# Patient Record
Sex: Female | Born: 1935 | Race: Black or African American | Hispanic: No | Smoking: Never smoker
Health system: Southern US, Community
[De-identification: ages and names within clinical notes are randomized; demographics above are authoritative.]

## PROBLEM LIST (undated history)

## (undated) DIAGNOSIS — I480 Paroxysmal atrial fibrillation: Secondary | ICD-10-CM

## (undated) DIAGNOSIS — K648 Other hemorrhoids: Secondary | ICD-10-CM

## (undated) DIAGNOSIS — I2510 Atherosclerotic heart disease of native coronary artery without angina pectoris: Secondary | ICD-10-CM

## (undated) DIAGNOSIS — K922 Gastrointestinal hemorrhage, unspecified: Secondary | ICD-10-CM

## (undated) DIAGNOSIS — I10 Essential (primary) hypertension: Secondary | ICD-10-CM

## (undated) DIAGNOSIS — I639 Cerebral infarction, unspecified: Secondary | ICD-10-CM

## (undated) DIAGNOSIS — E119 Type 2 diabetes mellitus without complications: Secondary | ICD-10-CM

## (undated) HISTORY — DX: Cerebral infarction, unspecified: I63.9

## (undated) HISTORY — DX: Other hemorrhoids: K64.8

## (undated) HISTORY — PX: CATARACT EXTRACTION: SUR2

## (undated) HISTORY — DX: Paroxysmal atrial fibrillation: I48.0

---

## 1997-05-21 ENCOUNTER — Ambulatory Visit (HOSPITAL_COMMUNITY): Admission: RE | Admit: 1997-05-21 | Discharge: 1997-05-21 | Payer: Self-pay | Admitting: Cardiology

## 1997-05-22 ENCOUNTER — Ambulatory Visit (HOSPITAL_COMMUNITY): Admission: RE | Admit: 1997-05-22 | Discharge: 1997-05-22 | Payer: Self-pay | Admitting: Cardiology

## 2000-02-14 ENCOUNTER — Ambulatory Visit (HOSPITAL_COMMUNITY): Admission: RE | Admit: 2000-02-14 | Discharge: 2000-02-14 | Payer: Self-pay | Admitting: *Deleted

## 2000-05-13 ENCOUNTER — Inpatient Hospital Stay (HOSPITAL_COMMUNITY): Admission: EM | Admit: 2000-05-13 | Discharge: 2000-05-19 | Payer: Self-pay

## 2000-05-15 ENCOUNTER — Encounter: Payer: Self-pay | Admitting: Cardiovascular Disease

## 2003-02-21 ENCOUNTER — Ambulatory Visit (HOSPITAL_COMMUNITY): Admission: RE | Admit: 2003-02-21 | Discharge: 2003-02-21 | Payer: Self-pay | Admitting: Cardiovascular Disease

## 2003-09-24 ENCOUNTER — Other Ambulatory Visit: Admission: RE | Admit: 2003-09-24 | Discharge: 2003-09-24 | Payer: Self-pay | Admitting: Obstetrics and Gynecology

## 2008-01-18 ENCOUNTER — Ambulatory Visit (HOSPITAL_COMMUNITY): Admission: RE | Admit: 2008-01-18 | Discharge: 2008-01-18 | Payer: Self-pay | Admitting: Cardiovascular Disease

## 2008-01-23 ENCOUNTER — Ambulatory Visit (HOSPITAL_COMMUNITY): Admission: RE | Admit: 2008-01-23 | Discharge: 2008-01-23 | Payer: Self-pay | Admitting: Obstetrics and Gynecology

## 2008-01-23 ENCOUNTER — Encounter (INDEPENDENT_AMBULATORY_CARE_PROVIDER_SITE_OTHER): Payer: Self-pay | Admitting: Obstetrics and Gynecology

## 2009-03-23 ENCOUNTER — Encounter: Admission: RE | Admit: 2009-03-23 | Discharge: 2009-03-23 | Payer: Self-pay | Admitting: Obstetrics and Gynecology

## 2009-03-23 IMAGING — US US SOFT TISSUE HEAD/NECK
1 series · 14 of 25 positions shown · non-contrast
Comparison: None

CLINICAL DATA: Enlarged thyroid

THYROID ULTRASOUND
TECHNIQUE: Ultrasound examination of the thyroid gland and
adjacent soft tissues was performed.

[Series 1: us soft tissue head/neck · 0.06mm/px · 14 of 45 slices shown]
[im 1/45]
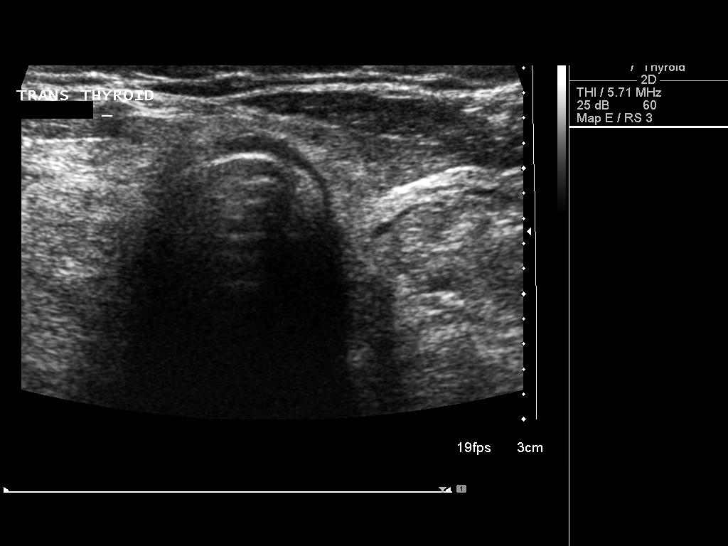
[im 4/45]
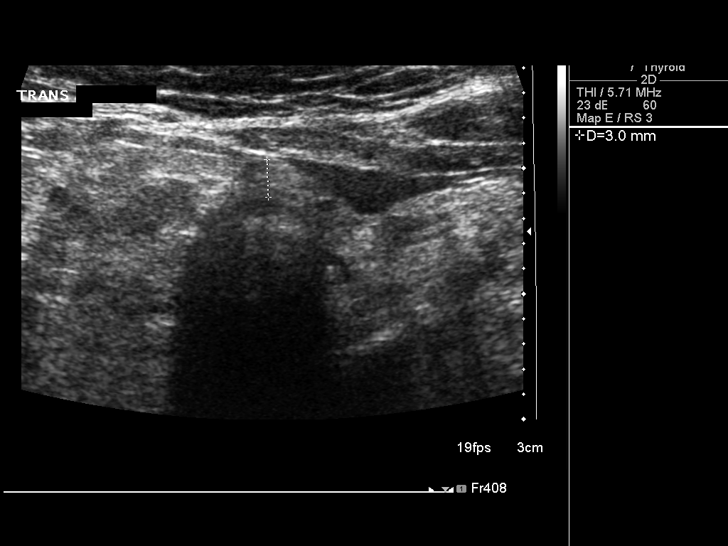
[im 8/45]
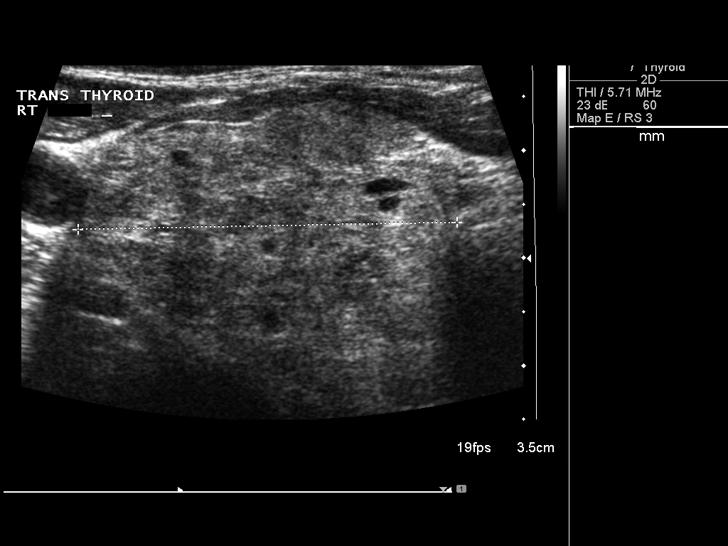
[im 12/45]
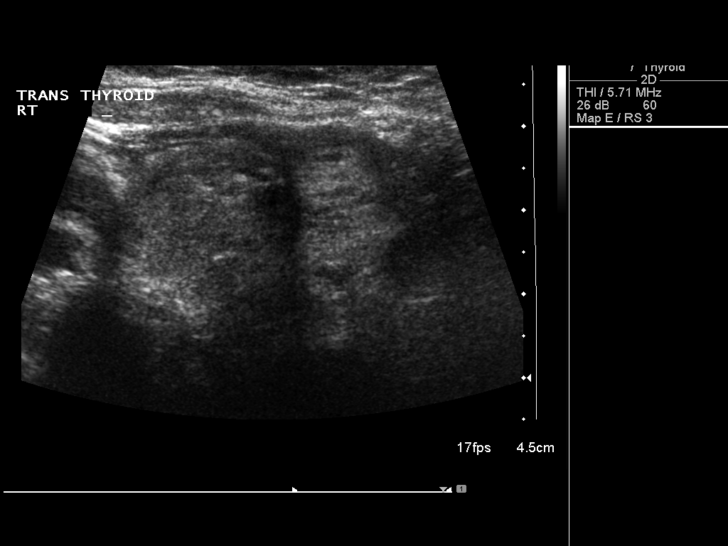
[im 15/45]
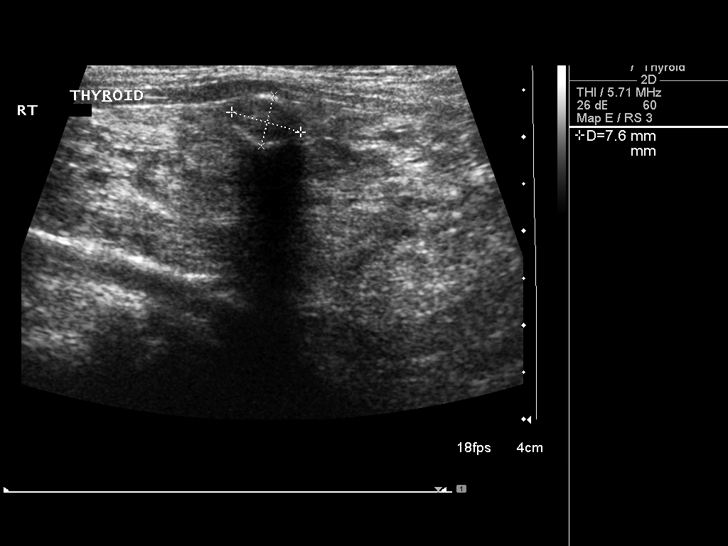
[im 17/45]
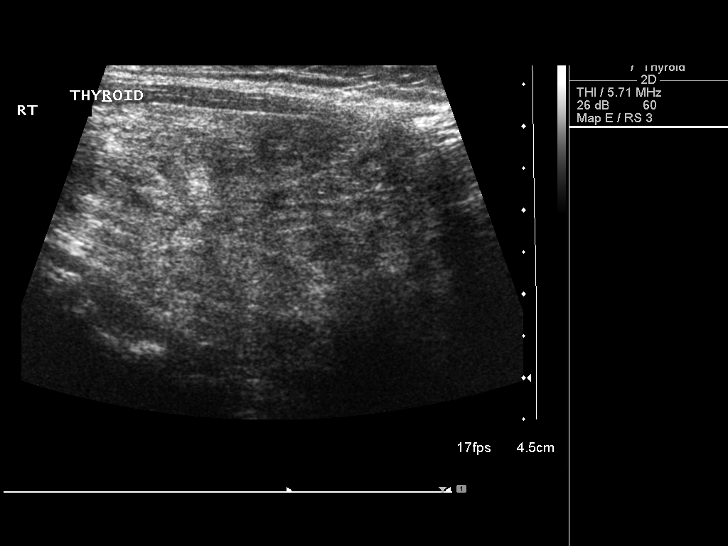
[im 21/45]
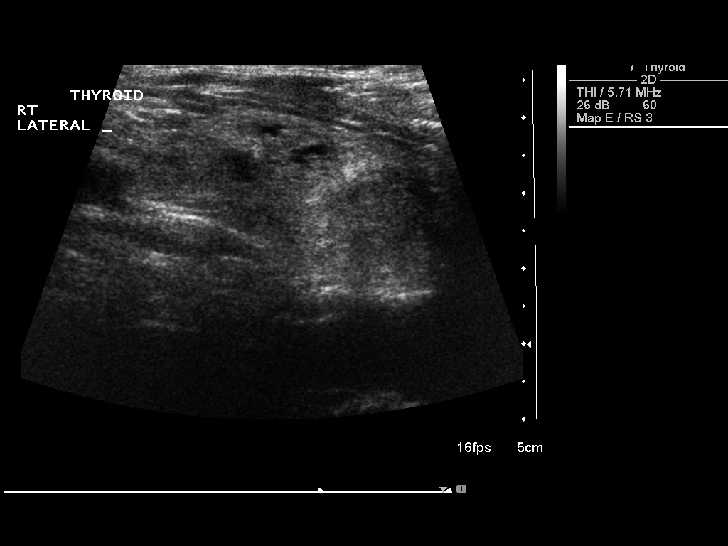
[im 24/45]
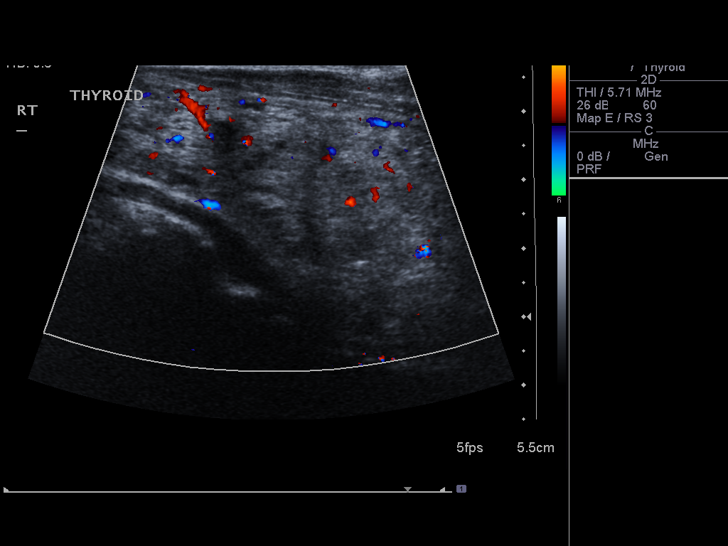
[im 28/45]
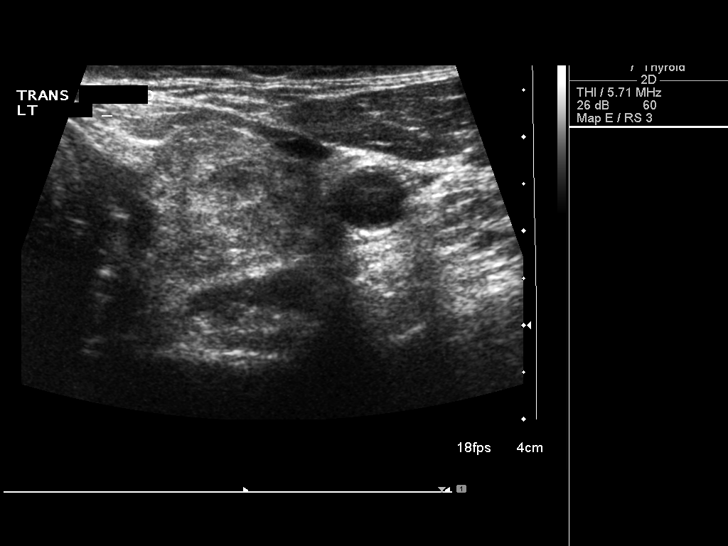
[im 30/45]
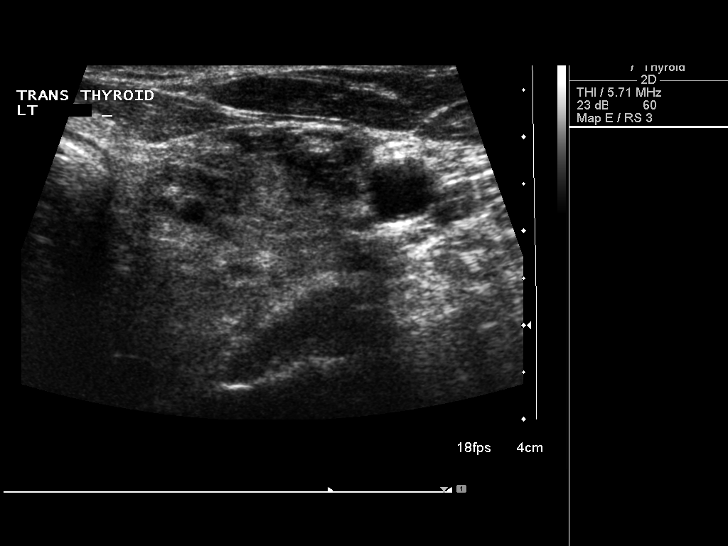
[im 34/45]
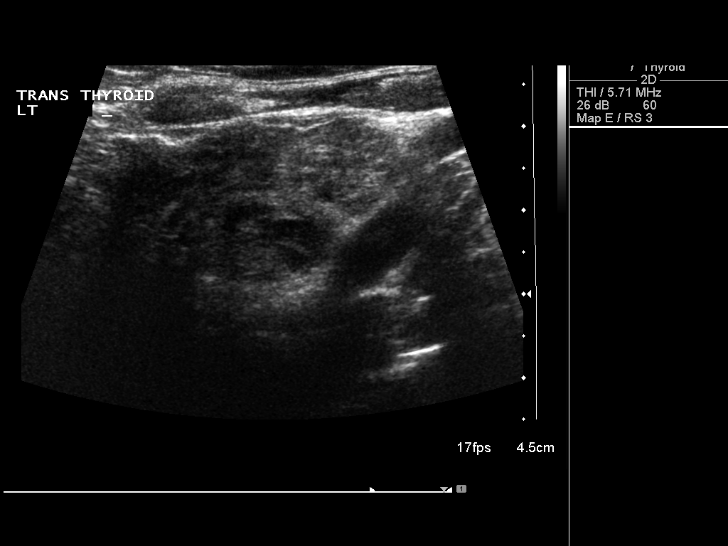
[im 37/45]
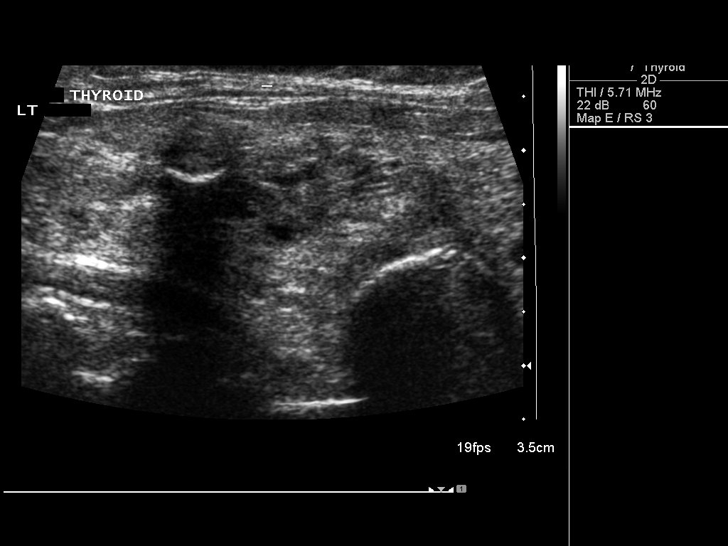
[im 41/45]
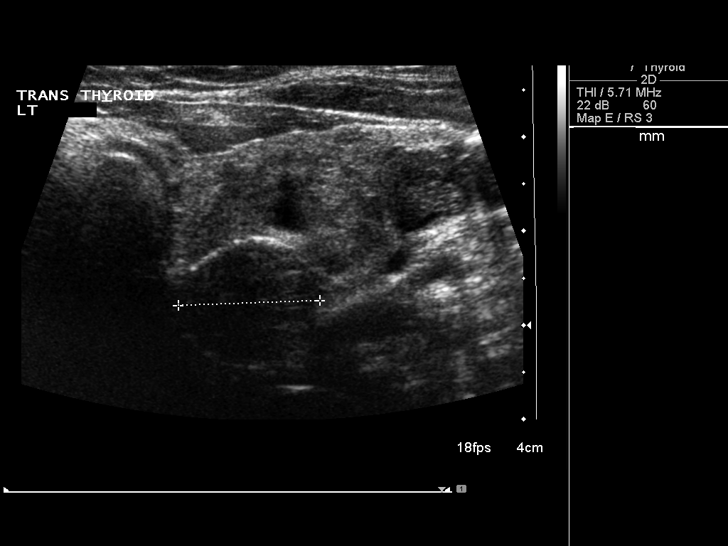
[im 45/45]
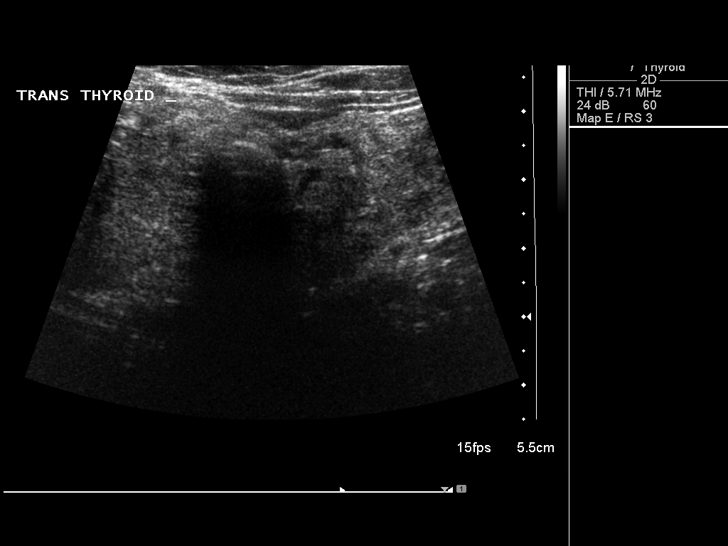

[14 of 25 positions shown; findings below may reference images not displayed]

FINDINGS: The thyroid gland is diffusely enlarged, inhomogeneous,
and nodular.  The right lobe measures 7.4 cm sagittally, with a
depth of 3.0 cm and width of 3.5 cm.  The left lobe measures 7.8 x
2.5 x 3.0 cm, with the isthmus measuring 3 mm.  There is a solid
nodule in the lower pole of the left lobe of 1.7 x 1.4 x 1.5 cm
with calcified periphery.  A small nodule in the left mid upper
lobe measures 8 x 6 x 7 mm.  A small nodule in the right mid upper
lobe measures 8 x 6 x 7 mm.  Both of these small nodules have a
calcified periphery.
IMPRESSION: Diffusely enlarged, inhomogeneous, and nodular thyroid gland with
the dominant nodule in the lower pole on the left of 1.7 x 1.4 x
1.5 cm.

## 2009-05-25 ENCOUNTER — Ambulatory Visit (HOSPITAL_COMMUNITY): Admission: RE | Admit: 2009-05-25 | Discharge: 2009-05-25 | Payer: Self-pay | Admitting: Endocrinology

## 2009-06-12 ENCOUNTER — Ambulatory Visit (HOSPITAL_COMMUNITY): Admission: RE | Admit: 2009-06-12 | Discharge: 2009-06-12 | Payer: Self-pay | Admitting: Endocrinology

## 2009-06-12 IMAGING — US US BIOPSY
1 series · 5 of 5 positions shown · non-contrast
Comparison: Ultrasound performed [DATE] revealed a dominant
left lower pole nodule measuring 1.7 x 1.5 cm.

CLINICAL DATA: Palpable left thyroid nodule. INR today is 1.30.

ULTRASOUND-GUIDED NEEDLE ASPIRATE BIOPSY, LEFT LOBE OF THYROID
The above procedure was discussed with the patient and written
informed consent was obtained.

[Series 1: us biopsy · 0.09mm/px · 5 of 5 slices shown]
[im 1/5]
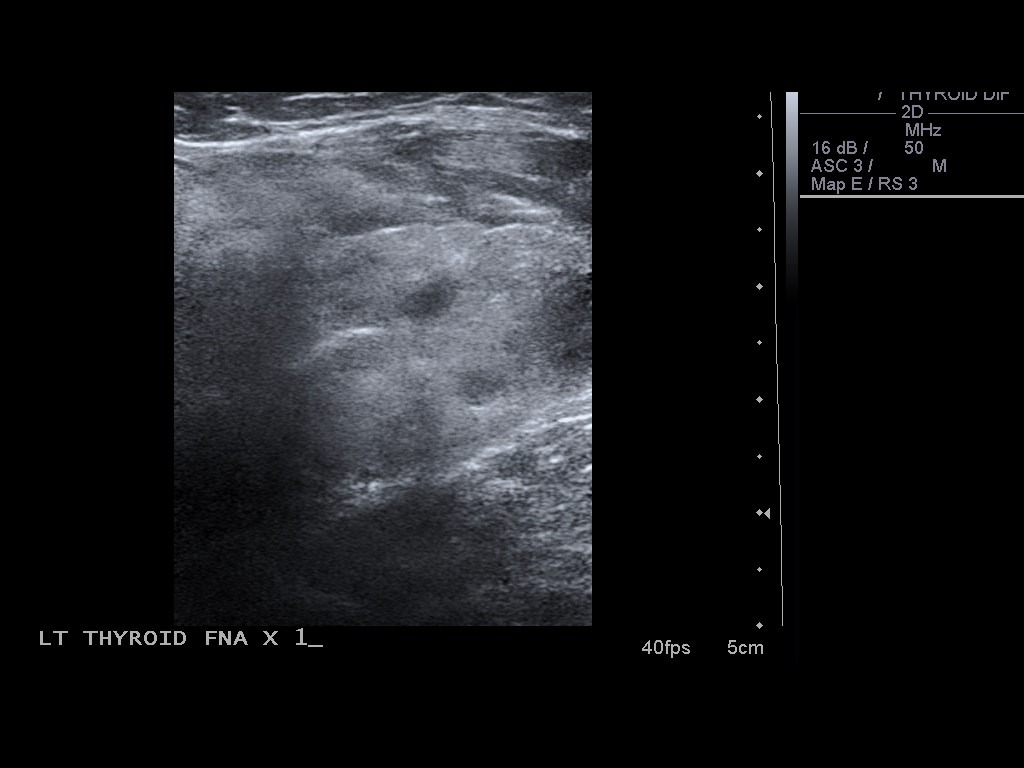
[im 2/5]
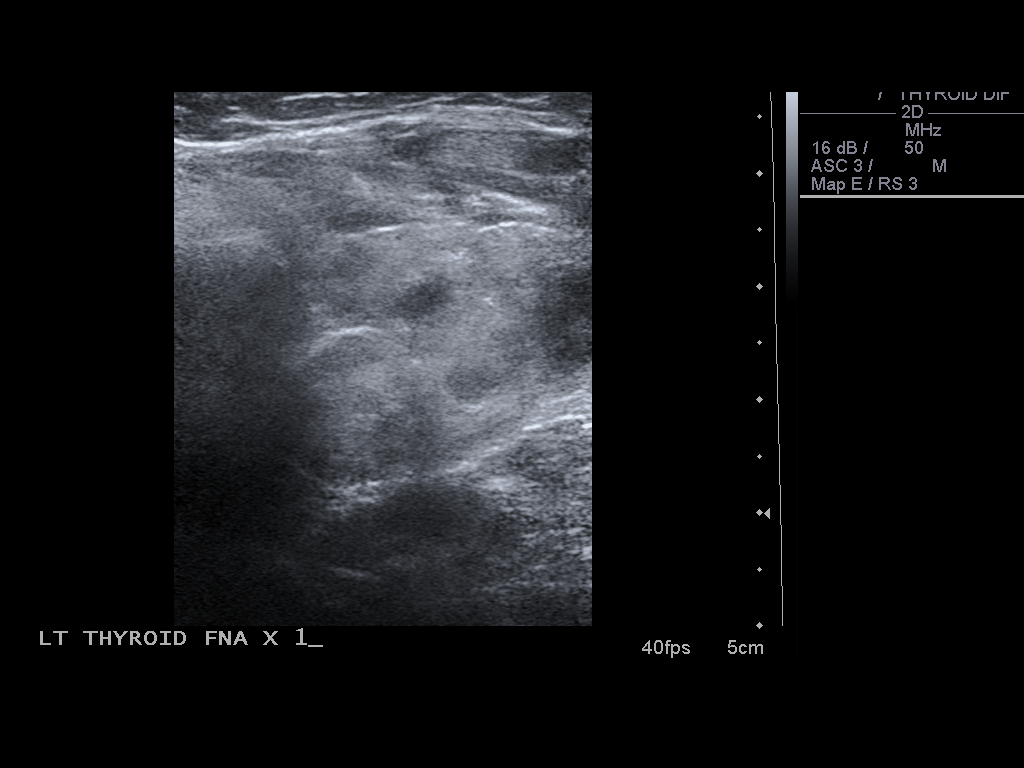
[im 3/5]
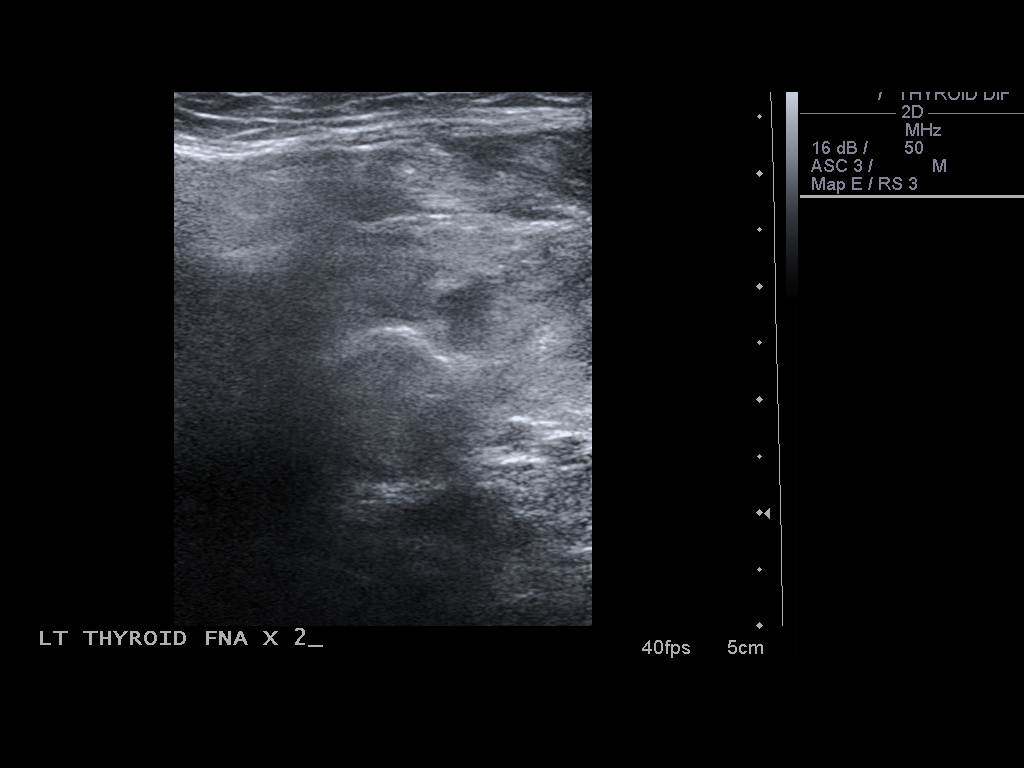
[im 4/5]
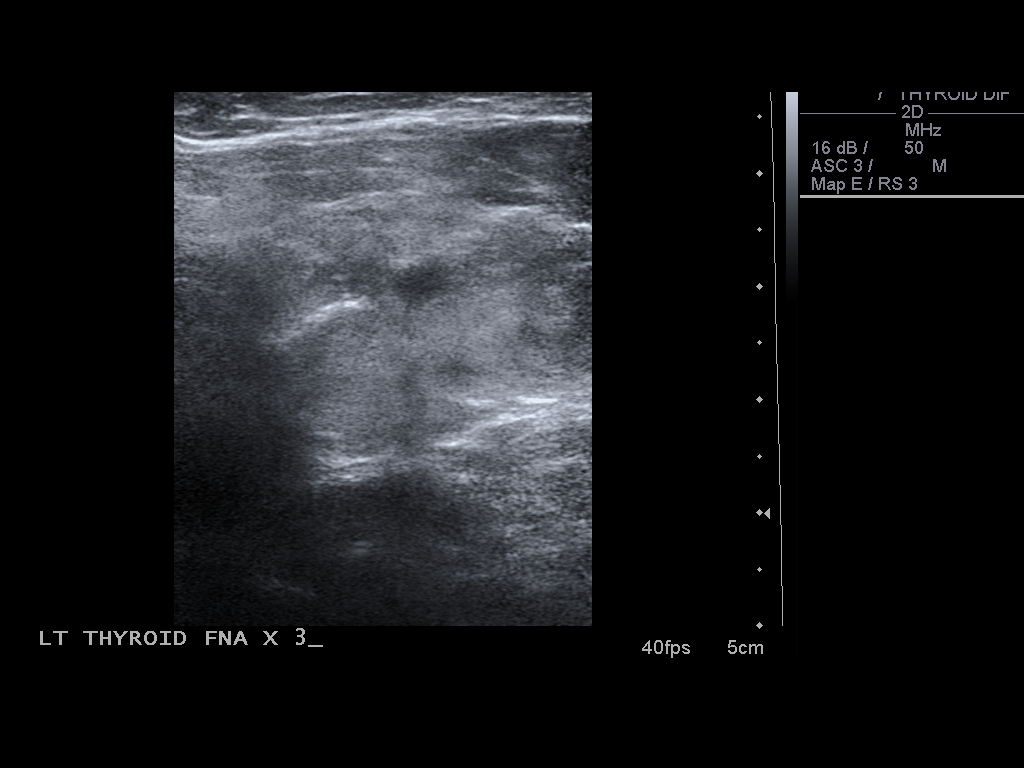
[im 5/5]
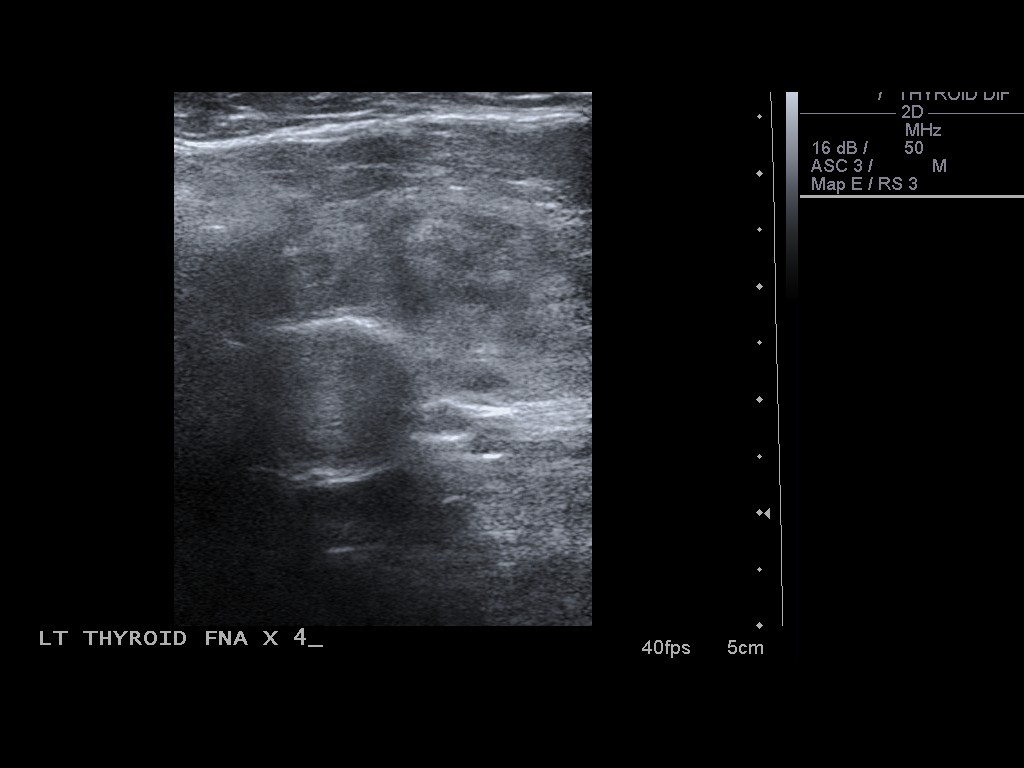

[5 of 5 positions shown; findings below may reference images not displayed]

Nuclear medicine
scan performed [DATE] which revealed a cold nodule which
corresponds to the left lower pole nodule seen on prior ultrasound
imaging.  Request has been made for fine needle aspirate of this
nodule.
FINDINGS: Ultrasound was performed to localize and mark an adequate
site for the biopsy.  The patient was then prepped and draped in a
normal sterile fashion.  Local anesthesia was provided with 1%
lidocaine.  Using direct ultrasound guidance,
4 passes were made using 25 gauge needles into the nodule within
the left lobe of the thyroid.  Ultrasound was used to confirm
needle placements on all occasions.  Specimens were sent to
Pathology for analysis.  Post procedural imaging demonstrated no
hematoma or immediate complication.  The patient tolerated the
procedure well.
IMPRESSION: Successful fine needle aspirate of left lower pole thyroid nodule
as described above.  The patient has been advised to resume her
Coumadin therapy tomorrow.

Read by: JUMPER.-JUMPER

## 2010-05-04 LAB — PROTIME-INR: Prothrombin Time: 16.1 seconds — ABNORMAL HIGH (ref 11.6–15.2)

## 2010-06-29 NOTE — Op Note (Signed)
NAME:  Patricia English, Patricia English NO.:  [id number]   MEDICAL RECORD NO.:  [id number]          PATIENT TYPE:  AMB   LOCATION:  SDC                           FACILITY:  [facility]   PHYSICIAN:  Osborn Coho, M.D.   DATE OF BIRTH:  07-23-1927   DATE OF PROCEDURE:  01/23/2008  DATE OF DISCHARGE:                               OPERATIVE REPORT   PREOPERATIVE DIAGNOSES:  1. Postmenopausal bleeding.  2. Endometrial masses.   POSTOPERATIVE DIAGNOSES:  1. Postmenopausal bleeding.  2. Endometrial polyps.   PROCEDURE:  1. Hysteroscopy.  2. D and C.  3. Polypectomy.   ATTENDING:  Osborn Coho, MD   ANESTHESIA:  General.   SPECIMENS TO PATHOLOGY:  1. Polyps.  2. Endometrial curettings.   FLUIDS:  900 mL.   URINE OUTPUT:  Quantity sufficient via straight cath prior to procedure.   ESTIMATED BLOOD LOSS:  Minimal.   COMPLICATIONS:  None.   PROCEDURE:  The patient was taken to the operating room after the risks,  benefits, and alternatives were discussed with the patient, the patient  verbalized understanding and consent signed and witnessed.  The patient  was placed under general anesthesia and prepped and draped in a normal  sterile fashion in the dorsal lithotomy position.  A bivalve speculum  placed in the patient's vagina, and a paracervical block administered  using a total of 10 mL of 1% lidocaine.  The anterior lip of the cervix  was grasped with a single-tooth tenaculum, and cervix was essentially  dilated and sounded to 10 cm.  The hysteroscope was introduced and  multiple polypoid lesions were noted.  There was one on each lateral  sidewall, one at the fundus, and one long one that extended from the  anterior uterine wall into the cervical canal.  Polypectomy was  performed until most of the polypoid tissue was removed, and curettage  was performed until a gritty texture was noted.  The hysteroscope was  introduced once again, and no obvious  intracavitary lesions  were noted at that time.  All instruments were  removed, and there was good hemostasis at the tenaculum sites.  Count  was correct.  The patient tolerated the procedure well and is currently  awaiting transfer to the recovery room in good condition.      Osborn Coho, M.D.  Electronically Signed     AR/MEDQ  D:  01/23/2008  T:  01/24/2008  Job:  [id number]

## 2010-07-02 NOTE — Cardiovascular Report (Signed)
[name], Patricia English                         ACCOUNT NO.:  [id number]   MEDICAL RECORD NO.:  [id number]                   PATIENT TYPE:  OIB   LOCATION:  2854                                 FACILITY:  [facility]   PHYSICIAN:  Ricki Rodriguez, M.D.               DATE OF BIRTH:  11-Oct-1927   DATE OF PROCEDURE:  02/21/2003  DATE OF DISCHARGE:                              CARDIAC CATHETERIZATION   PROCEDURE:  1. Left heart catheterization.  2. Selective coronary angiography.  3. Left ventricular function study.   INDICATIONS:  This 78 year old black female had known coronary artery  disease with typical chest pain and EKG changes of ischemia on stress test  along with risk factors of hypertension.   APPROACH:  Right femoral artery using 6-French diagnostic catheters.   COMPLICATIONS:  None.   AngioSeal applied at end of procedure successfully.   LEFT VENTRICULOGRAM:  The left ventriculogram showed good left ventricular  systolic function with ejection fraction of 70%.   HEMODYNAMIC DATA:  The left ventricle pressure was 137/20 and aortic  pressure was 137/69 mmHg.   CORONARY ANATOMY:  The left main coronary artery was unremarkable.   Left anterior descending coronary artery:  The left anterior descending  coronary artery had a mild proximal calcification with luminal  irregularities.  Rest of the vessel was unremarkable.  The diagonal branch  was also unremarkable.   Left circumflex coronary artery:  The left circumflex coronary artery was a  large vessel with normal obtuse marginal branch 1, 2, and 3.  It was a  codominant vessel.   Right coronary artery:  The right coronary artery was also codominant and  unremarkable.   IMPRESSION:  1. Minimal left anterior descending coronary artery disease.  2. Normal left ventricular systolic function.   RECOMMENDATIONS:  This patient will be treated medically.                                              Ricki Rodriguez, M.D.   ASK/MEDQ  D:  02/21/2003  T:  02/21/2003  Job:  [id number]

## 2010-07-02 NOTE — Cardiovascular Report (Signed)
[place]  Patient:    Patricia English, Patricia English                      MRN: [id number] Proc. Date: 05/17/00 Adm. Date:  [id number] Attending:  Ricki Rodriguez CC:         Osvaldo Shipper. Spruill, M.D.  Cardiac Catheterization Laboratory   Cardiac Catheterization  REFERRING PHYSICIAN:  Osvaldo Shipper. Spruill, M.D.  HOSPITAL LOCATION:  2019 at [facility].  PROCEDURES:  Left heart catheterization, selective coronary angiography and left ventricular function study, and descending aortography.  INDICATIONS:  This 77 year old, black female, with atypical chest pain had abnormal nuclear stress test, hypertension, atrial flutter, and presyncope.  COMPLICATIONS:  None.  APPROACH:  Right femoral artery using 6 French diagnostic catheters, 2000 units of IV heparin were given in the arterial sheath.  HEMODYNAMIC DATA:  The left ventricular pressure is 100/9, aortic pressure was 107/50.  LEFT VENTRICULOGRAM:  The left ventriculogram showed ejection fraction of 62% with a mild apical hypokinesia.  AORTOGRAM:  The aortogram showed normal renal arteries and normal distal aorta and a common iliac vessels.  CORONARY ANATOMY:  Left main:  The left main coronary artery was essentially unremarkable and it was a very short vessel.  Left left anterior descending:  Left anterior descending coronary artery had luminal irregularities and a mid vessel 30-40% concentric stenosis with a slow distal filling of the rest of the vessel.  The diagonal vessel was a small vessel.  The left anterior descending coronary artery wrapped around the apex of the heart supplying lower one-third of the septum.  Left circumflex:  The left circumflex coronary artery was a dominant vessel, had luminal irregularities, had a small obtuse marginal branch #1 and 2 and a large obtuse marginal branch three.  The ramus branch was a average size vessel and the posterior descending coronary artery  and posterolateral branches were small.  Right coronary artery:  The right coronary artery was small and unremarkable.  IMPRESSION: 1. Mild multivessel coronary artery disease. 2. Preserved left ventricular systolic function with apical hypokinesia. 3. Normal renal arteries.  RECOMMENDATIONS:  This patient will be treated medically for now. DD:  05/17/00 TD:  05/18/00 Job: [id number]

## 2010-07-02 NOTE — Discharge Summary (Signed)
[place]  Patient:    Patricia English, Patricia English                      MRN: [id number] Adm. Date:  [id number] Disc. Date: [id number] Attending:  Orpah Cobb S                           Discharge Summary  DISCHARGE DIAGNOSES: 1. Paroxysmal atrial flutter. 2. Mild multivessel native vessel coronary artery disease. 3. Obesity. 4. Mitral regurgitation.  DISCHARGE MEDICATIONS: 1. Plavix 75 mg one p.o. daily. 2. Lanoxin 0.25 mg one p.o. daily. 3. Robitussin DM 2 tsp four times daily. 4. Coated aspirin 325 mg one p.o. daily. 5. Coumadin 5 mg one p.o. daily. 6. Toprol XL 100 mg in the morning and 50 mg in the evening. 7. Cardizem CD 180 mg twice daily.  ACTIVITY:  As tolerated.  DIET:  Low fat, low salt diet.  SPECIAL INSTRUCTIONS:  The patient to notify physician if she has right groin pain, swelling or discharge.  PTT in 15 days.  INR goal 2.5 to 3.  FOLLOWUP:  Follow up with Dr. Orpah Cobb in one week.  The patient is to call [phone].  Dr. Otelia Santee Spruill in one month.  The patient to call [phone].  HISTORY OF PRESENT ILLNESS:  This 77 year old, African-American female had flu-like symptoms and felt like blacking out.  The patient was not aware of palpitations.  She denied any chest pain and had mild cough with clear sputum. In the emergency room, she had heart rate into 150s to 160s.  PHYSICAL EXAMINATION:  VITAL SIGNS:  Temperature 98, pulse 91, respiratory rate 20, blood pressure 103/53, height 5 feet 8 inches, weight 190 pounds.  GENERAL:  The patient is alert and oriented x 3.  HEENT:  Head atraumatic, normocephalic with gray-white hair.  Brown eyes with pupils reactive to light, extraocular movements intact.  Conjunctivae pink, sclerae nonicteric.  Mucous membranes pink and moist with loss of few molars and mild caries in many teeth.  NECK:  No JVD or carotid bruit.  LUNGS:  Clear to auscultation.  HEART:  Normal S1,  S2.  ABDOMEN:  Soft and nontender.  EXTREMITIES:  No cyanosis or clubbing.  NEUROLOGIC:  Cranial nerves II-XII intact.  Bilateral equal grips.  The patient moved all four extremities.  LABORATORY DATA AND X-RAY FINDINGS:  Hemoglobin 13.5, hematocrit 39.1, WBC 8000, platelet count 208,000.  Normal electrolytes, BUN and creatinine.  CK 93.  PT 13.7, troponin I 0.07.  EKG revealed atrial flutter with rapid ventricular response to sinus rhythm with IV Cardizem use.  Chest x-ray showed basilar atelectasis.  Cardiac catheterization revealed 62% ejection fraction with apical hypokinesis, luminal irregularities of the left anterior descending coronary artery with mid vessel 30-40% lesion and slow distal filling.  Diagonal vessels were very small.  Left circumflex coronary artery had luminal irregularities.  Right coronary artery was small and nondominant.  EKG showed atrial flutter with possible inferior wall infarct.  Post conversion to sinus rhythm.  EKG was sinus rhythm and essentially unremarkable.  Nuclear scan of the heart showed possible focal region of ischemia involving the distal anterior.  Ultrasound of the heart as a transesophageal echocardiogram showed near normal left ventricular systolic function with mild calcified aortic valve, moderate extensive atheroma of the descending aorta, moderate calcification of the mitral valve with mild mitral valve regurgitation, no left anterior thrombus.  HOSPITAL COURSE:  The patient was admitted to telemetry unit.  She received IV Cardizem because of her cardiac risk factors and syncopal episode.  It was decided to evaluate the patients heart with nuclear stress test.  This showed a possibility of apical ischemia.  The patient underwent cardiac catheterization after understanding risks, benefits and alternatives.  The cardiac catheterization showed a noncritical mid vessel left anterior descending coronary artery disease disease, however,  with a slow distal filling.  This is usually suggestive of autolysis of significant left anterior descending coronary artery lesion that now appears noncritical.  It was decided to treat the patient medically.  Her atrial flutter rate was controlled by addition and increase of Toprol use.  The patient was started on Coumadin along with aspirin and Plavix with an INR goal of 2-3.  On May 19, 2000, the patient was discharged home in satisfactory condition with followup by me in one week and followup by primary care physician in two to four weeks. D:  07/19/00 TD:  07/20/00 Job: [id number]

## 2010-11-18 LAB — BASIC METABOLIC PANEL
BUN: 14 mg/dL (ref 6–23)
Calcium: 9.7 mg/dL (ref 8.4–10.5)
Chloride: 103 mEq/L (ref 96–112)
GFR calc Af Amer: >60 mL/min (ref >60)
Glucose, Bld: 138 mg/dL — ABNORMAL HIGH (ref 70–99)
Sodium: 138 mEq/L (ref 135–145)

## 2010-11-18 LAB — PROTIME-INR: INR: 1.2 (ref 0.00–1.49)

## 2010-11-18 LAB — CBC
HCT: 39.8 % (ref 36.0–46.0)
Platelets: 265 10*3/uL (ref 150–400)
RDW: 13.1 % (ref 11.5–15.5)

## 2010-11-18 LAB — GLUCOSE, CAPILLARY: Glucose-Capillary: 117 mg/dL — ABNORMAL HIGH (ref 70–99)

## 2010-11-19 LAB — BASIC METABOLIC PANEL
CO2: 27 mEq/L (ref 19–32)
Calcium: 9 mg/dL (ref 8.4–10.5)
Creatinine, Ser: 0.84 mg/dL (ref 0.4–1.2)
GFR calc Af Amer: >60 mL/min (ref >60)
GFR calc non Af Amer: >60 mL/min (ref >60)
Glucose, Bld: 209 mg/dL — ABNORMAL HIGH (ref 70–99)
Sodium: 137 mEq/L (ref 135–145)

## 2010-11-19 LAB — APTT: aPTT: 28 seconds (ref 24–37)

## 2010-11-19 LAB — GLUCOSE, CAPILLARY: Glucose-Capillary: 113 mg/dL — ABNORMAL HIGH (ref 70–99)

## 2010-11-19 LAB — CBC
MCHC: 33.4 g/dL (ref 30.0–36.0)
RDW: 13.2 % (ref 11.5–15.5)

## 2012-10-13 ENCOUNTER — Inpatient Hospital Stay (HOSPITAL_COMMUNITY)
Admission: EM | Admit: 2012-10-13 | Discharge: 2012-10-15 | DRG: 378 | Disposition: A | Discharge status: Home / Self Care | Payer: Medicare Other | Attending: Cardiovascular Disease | Admitting: Cardiovascular Disease

## 2012-10-13 ENCOUNTER — Encounter (HOSPITAL_COMMUNITY): Payer: Self-pay

## 2012-10-13 DIAGNOSIS — K921 Melena: Secondary | ICD-10-CM

## 2012-10-13 DIAGNOSIS — Z7901 Long term (current) use of anticoagulants: Secondary | ICD-10-CM

## 2012-10-13 DIAGNOSIS — I10 Essential (primary) hypertension: Secondary | ICD-10-CM | POA: Diagnosis present

## 2012-10-13 DIAGNOSIS — I4891 Unspecified atrial fibrillation: Secondary | ICD-10-CM | POA: Diagnosis present

## 2012-10-13 DIAGNOSIS — I2510 Atherosclerotic heart disease of native coronary artery without angina pectoris: Secondary | ICD-10-CM | POA: Diagnosis present

## 2012-10-13 DIAGNOSIS — R791 Abnormal coagulation profile: Secondary | ICD-10-CM | POA: Diagnosis present

## 2012-10-13 DIAGNOSIS — T45515A Adverse effect of anticoagulants, initial encounter: Secondary | ICD-10-CM | POA: Diagnosis present

## 2012-10-13 DIAGNOSIS — E119 Type 2 diabetes mellitus without complications: Secondary | ICD-10-CM | POA: Diagnosis present

## 2012-10-13 DIAGNOSIS — D62 Acute posthemorrhagic anemia: Secondary | ICD-10-CM | POA: Diagnosis present

## 2012-10-13 DIAGNOSIS — Z79899 Other long term (current) drug therapy: Secondary | ICD-10-CM

## 2012-10-13 HISTORY — DX: Atherosclerotic heart disease of native coronary artery without angina pectoris: I25.10

## 2012-10-13 HISTORY — DX: Type 2 diabetes mellitus without complications: E11.9

## 2012-10-13 HISTORY — DX: Essential (primary) hypertension: I10

## 2012-10-13 LAB — CBC WITH DIFFERENTIAL/PLATELET
Basophils Absolute: 0 10*3/uL (ref 0.0–0.1)
Eosinophils Relative: 4 % (ref 0–5)
Hemoglobin: 11.7 g/dL — ABNORMAL LOW (ref 12.0–15.0)
Lymphocytes Relative: 24 % (ref 12–46)
Lymphs Abs: 2.5 10*3/uL (ref 0.7–4.0)
MCH: 31 pg (ref 26.0–34.0)
MCV: 88.6 fL (ref 78.0–100.0)
Monocytes Relative: 8 % (ref 3–12)
Neutrophils Relative %: 64 % (ref 43–77)
Platelets: 266 10*3/uL (ref 150–400)
RBC: 3.77 MIL/uL — ABNORMAL LOW (ref 3.87–5.11)

## 2012-10-13 LAB — COMPREHENSIVE METABOLIC PANEL
AST: 16 U/L (ref 0–37)
Alkaline Phosphatase: 50 U/L (ref 39–117)
Calcium: 8.9 mg/dL (ref 8.4–10.5)
Creatinine, Ser: 1.04 mg/dL (ref 0.50–1.10)
GFR calc Af Amer: 56 mL/min — ABNORMAL LOW (ref >90)
GFR calc non Af Amer: 49 mL/min — ABNORMAL LOW (ref >90)
Glucose, Bld: 100 mg/dL — ABNORMAL HIGH (ref 70–99)
Total Protein: 7.6 g/dL (ref 6.0–8.3)

## 2012-10-13 LAB — URINALYSIS, ROUTINE W REFLEX MICROSCOPIC
Glucose, UA: NEGATIVE mg/dL
Nitrite: POSITIVE — AB
pH: 5 (ref 5.0–8.0)

## 2012-10-13 LAB — HEMOGLOBIN A1C: Hgb A1c MFr Bld: 5.9 % — ABNORMAL HIGH (ref <5.7)

## 2012-10-13 LAB — LIPASE, BLOOD: Lipase: 46 U/L (ref 11–59)

## 2012-10-13 LAB — URINE MICROSCOPIC-ADD ON

## 2012-10-13 LAB — MRSA PCR SCREENING: MRSA by PCR: NEGATIVE

## 2012-10-13 LAB — PROTIME-INR
INR: 3.46 — ABNORMAL HIGH (ref 0.00–1.49)
Prothrombin Time: 33.5 seconds — ABNORMAL HIGH (ref 11.6–15.2)

## 2012-10-13 LAB — GLUCOSE, CAPILLARY: Glucose-Capillary: 59 mg/dL — ABNORMAL LOW (ref 70–99)

## 2012-10-13 LAB — TYPE AND SCREEN: Antibody Screen: NEGATIVE

## 2012-10-13 MED ORDER — SODIUM CHLORIDE 0.9 % IV SOLN
INTRAVENOUS | Status: DC
Start: 2012-10-13 — End: 2012-10-15
  Administered 2012-10-13 – 2012-10-14 (×2): via INTRAVENOUS

## 2012-10-13 MED ORDER — INSULIN ASPART 100 UNIT/ML ~~LOC~~ SOLN
3.0000 [IU] | Freq: Three times a day (TID) | SUBCUTANEOUS | Status: DC
  Administered 2012-10-15 (×2): 3 [IU] via SUBCUTANEOUS

## 2012-10-13 MED ORDER — SODIUM CHLORIDE 0.9 % IV SOLN
Freq: Once | INTRAVENOUS | Status: AC
  Administered 2012-10-13: 13:00:00 via INTRAVENOUS

## 2012-10-13 MED ORDER — ATORVASTATIN CALCIUM 10 MG PO TABS
10.0000 mg | ORAL_TABLET | Freq: Every day | ORAL | Status: DC
  Administered 2012-10-13 – 2012-10-14 (×2): 10 mg via ORAL
  Filled 2012-10-13 (×3): qty 1

## 2012-10-13 MED ORDER — PHYTONADIONE 5 MG PO TABS
10.0000 mg | ORAL_TABLET | Freq: Once | ORAL | Status: AC
  Administered 2012-10-13: 10 mg via ORAL
  Filled 2012-10-13: qty 2

## 2012-10-13 MED ORDER — METOPROLOL TARTRATE 25 MG PO TABS
25.0000 mg | ORAL_TABLET | Freq: Every day | ORAL | Status: DC
  Administered 2012-10-13 – 2012-10-15 (×3): 25 mg via ORAL
  Filled 2012-10-13 (×4): qty 1

## 2012-10-13 MED ORDER — PANTOPRAZOLE SODIUM 40 MG IV SOLR
40.0000 mg | Freq: Once | INTRAVENOUS | Status: AC
  Administered 2012-10-13: 40 mg via INTRAVENOUS
  Filled 2012-10-13: qty 40

## 2012-10-13 MED ORDER — METOPROLOL TARTRATE 25 MG PO TABS
25.0000 mg | ORAL_TABLET | Freq: Two times a day (BID) | ORAL | Status: DC
  Filled 2012-10-13: qty 2

## 2012-10-13 MED ORDER — ACETAMINOPHEN 325 MG PO TABS: 650.0000 mg | ORAL_TABLET | Freq: Four times a day (QID) | ORAL | Status: DC | PRN

## 2012-10-13 MED ORDER — ACETAMINOPHEN 650 MG RE SUPP: 650.0000 mg | Freq: Four times a day (QID) | RECTAL | Status: DC | PRN

## 2012-10-13 MED ORDER — METOPROLOL TARTRATE 50 MG PO TABS
50.0000 mg | ORAL_TABLET | Freq: Every day | ORAL | Status: DC
  Administered 2012-10-13 – 2012-10-14 (×2): 50 mg via ORAL
  Filled 2012-10-13 (×3): qty 1

## 2012-10-13 MED ORDER — INSULIN ASPART 100 UNIT/ML ~~LOC~~ SOLN
0.0000 [IU] | Freq: Three times a day (TID) | SUBCUTANEOUS | Status: DC
  Administered 2012-10-13: 1 [IU] via SUBCUTANEOUS

## 2012-10-13 MED ORDER — SODIUM CHLORIDE 0.9 % IJ SOLN
3.0000 mL | Freq: Two times a day (BID) | INTRAMUSCULAR | Status: DC
  Administered 2012-10-13 – 2012-10-15 (×4): 3 mL via INTRAVENOUS

## 2012-10-13 NOTE — ED Notes (Signed)
Moved bowels today and saw bright red blood coming from REctum, denies any ppain,   Feels woozy

## 2012-10-13 NOTE — ED Provider Notes (Signed)
CSN: [id number]     Arrival date & time 10/13/12  1229 History   First MD Initiated Contact with Patient 10/13/12 1238     Chief Complaint  Patient presents with  . Rectal Bleeding   (Consider location/radiation/quality/duration/timing/severity/associated sxs/prior Treatment) HPI Comments: Patient presents after episode of BRBPR after having BM this morning.  She had a normal soft brown BM and then noticed she had frank red blood in the toilet bowl and with wiping. This was painless. Denies abdominal pain, nausea or vomiting. She feels "woozy". Denies any chest pain or shortness of breath. She is on Coumadin for history of CAD. She reports history of hemorrhoids many years ago. States she colonoscopy 4 years ago that was benign. No further episodes of rectal bleeding. Denies any black-colored stools.  The history is provided by the patient and a relative.    Past Medical History  Diagnosis Date  . Coronary artery disease   . Diabetes mellitus without complication   . Hypertension    History reviewed. No pertinent past surgical history. No family history on file. History  Substance Use Topics  . Smoking status: Never Smoker   . Smokeless tobacco: Not on file  . Alcohol Use: No   OB History   Grav Para Term Preterm Abortions TAB SAB Ect Mult Living                 Review of Systems  Constitutional: Positive for activity change and fatigue. Negative for fever.  HENT: Negative for congestion and rhinorrhea.   Respiratory: Negative for cough, chest tightness and shortness of breath.   Cardiovascular: Negative for chest pain.  Gastrointestinal: Positive for blood in stool and hematochezia. Negative for nausea, vomiting, abdominal pain and diarrhea.  Genitourinary: Negative for dysuria, hematuria, vaginal bleeding and vaginal discharge.  Musculoskeletal: Positive for back pain.  Neurological: Positive for weakness and light-headedness. Negative for dizziness.  A complete 10 system  review of systems was obtained and all systems are negative except as noted in the HPI and PMH.    Allergies  Review of patient's allergies indicates no known allergies.  Home Medications   Current Outpatient Rx  Name  Route  Sig  Dispense  Refill  . digoxin (LANOXIN) 0.25 MG tablet   Oral   Take 0.25 mg by mouth daily.         Marland Kitchen glipiZIDE (GLUCOTROL XL) 5 MG 24 hr tablet   Oral   Take 5 mg by mouth daily.         . isosorbide mononitrate (IMDUR) 30 MG 24 hr tablet   Oral   Take 30 mg by mouth daily.         Marland Kitchen lisinopril (PRINIVIL,ZESTRIL) 5 MG tablet   Oral   Take 5 mg by mouth daily.         . metFORMIN (GLUCOPHAGE) 500 MG tablet   Oral   Take 1,000 mg by mouth 2 (two) times daily with a meal.         . metoprolol (LOPRESSOR) 50 MG tablet   Oral   Take 25-50 mg by mouth 2 (two) times daily. Takes 1/2 tablet (25 mg) in the morning and 1 tablet (50mg ) at night         . rosuvastatin (CRESTOR) 10 MG tablet   Oral   Take 10 mg by mouth every evening.         . warfarin (COUMADIN) 6 MG tablet   Oral   Take 3-6  mg by mouth daily. Takes 1 tablet (6mg ) every day except Sunday; On Sunday takes 1/2 tablet (3mg )          BP 137/55  Pulse 69  Temp(Src) 98.2 F (36.8 C) (Oral)  Resp 17  Ht 5\' 9"  (1.753 m)  Wt 173 lb 9.6 oz (78.744 kg)  BMI 25.62 kg/m2  SpO2 100% Physical Exam  Constitutional: She is oriented to person, place, and time. She appears well-developed and well-nourished. No distress.  HENT:  Head: Normocephalic.  Mouth/Throat: Oropharynx is clear and moist. No oropharyngeal exudate.  Eyes: Conjunctivae and EOM are normal. Pupils are equal, round, and reactive to light.  Neck: Normal range of motion. Neck supple.  Cardiovascular: Normal rate, regular rhythm and normal heart sounds.   No murmur heard. Pulmonary/Chest: Effort normal and breath sounds normal. No respiratory distress.  Abdominal: Soft. There is no tenderness. There is no  rebound and no guarding.  No abdominal tenderness  Genitourinary: Guaiac positive stool.  Multiple small external hemorrhoids. Nontender, nonthrombosed. bloody mucoid discharge on examining finger.  Musculoskeletal: Normal range of motion. She exhibits no edema and no tenderness.  Neurological: She is alert and oriented to person, place, and time. No cranial nerve deficit. She exhibits normal muscle tone. Coordination normal.  Skin: Skin is warm.    ED Course  Procedures (including critical care time) Labs Review Labs Reviewed  CBC WITH DIFFERENTIAL - Abnormal; Notable for the following:    RBC 3.77 (*)    Hemoglobin 11.7 (*)    HCT 33.4 (*)    All other components within normal limits  COMPREHENSIVE METABOLIC PANEL - Abnormal; Notable for the following:    Glucose, Bld 100 (*)    GFR calc non Af Amer 49 (*)    GFR calc Af Amer 56 (*)    All other components within normal limits  PROTIME-INR - Abnormal; Notable for the following:    Prothrombin Time 33.5 (*)    INR 3.46 (*)    All other components within normal limits  OCCULT BLOOD, POC DEVICE - Abnormal; Notable for the following:    Fecal Occult Bld POSITIVE (*)    All other components within normal limits  LIPASE, BLOOD  TROPONIN I  URINALYSIS, ROUTINE W REFLEX MICROSCOPIC  TYPE AND SCREEN   Imaging Review No results found.  MDM   1. Hematochezia   2. Warfarin-induced coagulopathy, initial encounter    Hematochezia that is painless, patient on Coumadin. Vital stable, no distress, abdomen soft. No abdominal pain. Hemoglobin stable. INR 3.4 Blood pressure dropped to 90 systolic with standing. Improved to 110s with sitting and lying. She denies any dizziness or lightheadedness. Given her age, coagulopathy, and positive orthostatics, patient would benefit from admission and further workup.  Case discussed with Dr. Algie Coffer who will admit patient. Patient is known to [facility] gastroenterology. Discussed with Dr. Madilyn Fireman will  consult.   Date: 10/13/2012  Rate: 68  Rhythm: normal sinus rhythm  QRS Axis: normal  Intervals: normal  ST/T Wave abnormalities: normal  Conduction Disutrbances:none  Narrative Interpretation:   Old EKG Reviewed: none available    Glynn Octave, MD 10/13/12 1440

## 2012-10-13 NOTE — Progress Notes (Signed)
Hypoglycemic Event  CBG: 69  Treatment: 24 g sugar sprite  Symptoms: none  Follow-up CBG: Time: 2233 CBG Result: 102  Possible Reasons for Event:  Comments/MD notified:    Daniela Hernan, Alessandra Bevels  Remember to initiate Hypoglycemia Order Set & complete

## 2012-10-13 NOTE — H&P (Signed)
Patricia English is an 79 y.o. female.   Chief Complaint: Bright red bleeding from rectum. HPI: 79 year old female with chronic coumadin use for paroxysmal atrial fibrillation has acute GI bleed per rectum without pain. She has orthostatic dizziness also.  Past Medical History  Diagnosis Date  . Coronary artery disease   . Diabetes mellitus without complication   . Hypertension       History reviewed. No pertinent past surgical history.  No family history on file. Social History:  reports that she has never smoked. She does not have any smokeless tobacco history on file. She reports that she does not drink alcohol. Her drug history is not on file.  Allergies: No Known Allergies   (Not in a hospital admission)  Results for orders placed during the hospital encounter of 10/13/12 (from the past 48 hour(s))  CBC WITH DIFFERENTIAL     Status: Abnormal   Collection Time    10/13/12  1:14 PM      Result Value Range   WBC 10.3  4.0 - 10.5 K/uL   RBC 3.77 (*) 3.87 - 5.11 MIL/uL   Hemoglobin 11.7 (*) 12.0 - 15.0 g/dL   HCT 16.1 (*) 09.6 - 04.5 %   MCV 88.6  78.0 - 100.0 fL   MCH 31.0  26.0 - 34.0 pg   MCHC 35.0  30.0 - 36.0 g/dL   RDW 40.9  81.1 - 91.4 %   Platelets 266  150 - 400 K/uL   Neutrophils Relative % 64  43 - 77 %   Neutro Abs 6.5  1.7 - 7.7 K/uL   Lymphocytes Relative 24  12 - 46 %   Lymphs Abs 2.5  0.7 - 4.0 K/uL   Monocytes Relative 8  3 - 12 %   Monocytes Absolute 0.8  0.1 - 1.0 K/uL   Eosinophils Relative 4  0 - 5 %   Eosinophils Absolute 0.4  0.0 - 0.7 K/uL   Basophils Relative 0  0 - 1 %   Basophils Absolute 0.0  0.0 - 0.1 K/uL  COMPREHENSIVE METABOLIC PANEL     Status: Abnormal   Collection Time    10/13/12  1:14 PM      Result Value Range   Sodium 139  135 - 145 mEq/L   Potassium 4.2  3.5 - 5.1 mEq/L   Chloride 106  96 - 112 mEq/L   CO2 21  19 - 32 mEq/L   Glucose, Bld 100 (*) 70 - 99 mg/dL   BUN 17  6 - 23 mg/dL   Creatinine, Ser 7.82  0.50 - 1.10  mg/dL   Calcium 8.9  8.4 - 95.6 mg/dL   Total Protein 7.6  6.0 - 8.3 g/dL   Albumin 3.9  3.5 - 5.2 g/dL   AST 16  0 - 37 U/L   ALT 13  0 - 35 U/L   Alkaline Phosphatase 50  39 - 117 U/L   Total Bilirubin 0.6  0.3 - 1.2 mg/dL   GFR calc non Af Amer 49 (*) >90 mL/min   GFR calc Af Amer 56 (*) >90 mL/min   Comment: (NOTE)     The eGFR has been calculated using the CKD EPI equation.     This calculation has not been validated in all clinical situations.     eGFR's persistently <90 mL/min signify possible Chronic Kidney     Disease.  LIPASE, BLOOD     Status: None   Collection  Time    10/13/12  1:14 PM      Result Value Range   Lipase 46  11 - 59 U/L  TYPE AND SCREEN     Status: None   Collection Time    10/13/12  1:14 PM      Result Value Range   ABO/RH(D) O POS     Antibody Screen NEG     Sample Expiration 10/16/2012    TROPONIN I     Status: None   Collection Time    10/13/12  1:14 PM      Result Value Range   Troponin I <0.30  <0.30 ng/mL   Comment:            Due to the release kinetics of cTnI,     a negative result within the first hours     of the onset of symptoms does not rule out     myocardial infarction with certainty.     If myocardial infarction is still suspected,     repeat the test at appropriate intervals.  PROTIME-INR     Status: Abnormal   Collection Time    10/13/12  1:14 PM      Result Value Range   Prothrombin Time 33.5 (*) 11.6 - 15.2 seconds   INR 3.46 (*) 0.00 - 1.49  OCCULT BLOOD, POC DEVICE     Status: Abnormal   Collection Time    10/13/12  1:26 PM      Result Value Range   Fecal Occult Bld POSITIVE (*) NEGATIVE   No results found.  @ROS @ Constitutional: Positive for activity change and fatigue. Negative for fever.  HENT: Negative for congestion and rhinorrhea.  Respiratory: Negative for cough, chest tightness and shortness of breath.  Cardiovascular: Negative for chest pain.  Gastrointestinal: Positive for blood in stool and  hematochezia. Negative for nausea, vomiting, abdominal pain and diarrhea.  Genitourinary: Negative for dysuria, hematuria, vaginal bleeding and vaginal discharge.  Musculoskeletal: Positive for back pain.  Neurological: Positive for weakness and light-headedness. Negative for dizziness.  A complete 10 system review of systems was obtained and all systems are negative except as noted in the HPI and PMH.   Blood pressure 137/55, pulse 69, temperature 98.2 F (36.8 C), temperature source Oral, resp. rate 17, height 5\' 9"  (1.753 m), weight 78.744 kg (173 lb 9.6 oz), SpO2 100.00%. Physical Exam  Constitutional: She is oriented to person, place, and time. She appears well-developed and well-nourished. No distress.  HENT: Head: Normocephalic. Mouth/Throat: Oropharynx is clear and moist. No oropharyngeal exudate. Eyes: Patricia English, Conjunctivae and EOM are normal. Pupils are equal, round, and reactive to light.  Neck: Normal range of motion. Neck supple.  Cardiovascular: Normal rate, regular rhythm and normal heart sounds. II/VI systolic murmur. Pulmonary/Chest: Effort normal and breath sounds normal. No respiratory distress.  Abdominal: Soft. There is no tenderness. There is no rebound and no guarding.  No abdominal tenderness  Genitourinary: Guaiac positive stool.  Multiple small external hemorrhoids. Nontender, nonthrombosed. bloody mucoid discharge on examining finger per Dr. Glynn English.  Musculoskeletal: Normal range of motion. She exhibits no edema and no tenderness.  Neurological: She is alert and oriented to person, place, and time. No cranial nerve deficit. She exhibits normal muscle tone. Coordination normal.  Skin: Skin is warm.    Assessment/Plan Acute lower GI bleed Dizziness due to above. Acute blood loss anemia DM, II  Hold coumadin,  Vit. K to reverse coumadin effect. IV fluids/Hold most home medications  GI consult.  Patricia English S 10/13/2012, 2:44 PM

## 2012-10-13 NOTE — ED Notes (Signed)
MD at bedside. 

## 2012-10-13 NOTE — ED Notes (Signed)
Dr Kadakia at bedside 

## 2012-10-13 NOTE — ED Notes (Signed)
Pt states she had a bowel movement today and noticed a fair amt of bright red blood in toilet. Pt states she takes coumadin and every now and then has scant amts of blood in her stools but not enough to be concerned with like this time. Pt states bowel movement was not hard to have, she did not have to strain. Pt denies any pain but states she has some dizziness since the bowel movement.

## 2012-10-14 LAB — BASIC METABOLIC PANEL
BUN: 11 mg/dL (ref 6–23)
CO2: 20 mEq/L (ref 19–32)
Chloride: 108 mEq/L (ref 96–112)
Creatinine, Ser: 0.78 mg/dL (ref 0.50–1.10)
GFR calc Af Amer: 88 mL/min — ABNORMAL LOW (ref >90)
Glucose, Bld: 82 mg/dL (ref 70–99)

## 2012-10-14 LAB — CBC
HCT: 31.1 % — ABNORMAL LOW (ref 36.0–46.0)
Hemoglobin: 10.8 g/dL — ABNORMAL LOW (ref 12.0–15.0)
MCH: 30.3 pg (ref 26.0–34.0)
MCV: 87.4 fL (ref 78.0–100.0)
RBC: 3.56 MIL/uL — ABNORMAL LOW (ref 3.87–5.11)
WBC: 9.9 10*3/uL (ref 4.0–10.5)

## 2012-10-14 MED ORDER — PHYTONADIONE 5 MG PO TABS
5.0000 mg | ORAL_TABLET | Freq: Once | ORAL | Status: AC
  Administered 2012-10-14: 5 mg via ORAL
  Filled 2012-10-14: qty 1

## 2012-10-14 MED ORDER — PANTOPRAZOLE SODIUM 40 MG IV SOLR
40.0000 mg | INTRAVENOUS | Status: DC
  Administered 2012-10-14 – 2012-10-15 (×2): 40 mg via INTRAVENOUS
  Filled 2012-10-14 (×2): qty 40

## 2012-10-14 MED ORDER — CIPROFLOXACIN HCL 250 MG PO TABS
250.0000 mg | ORAL_TABLET | Freq: Two times a day (BID) | ORAL | Status: DC
  Administered 2012-10-14 – 2012-10-15 (×2): 250 mg via ORAL
  Filled 2012-10-14 (×4): qty 1

## 2012-10-14 NOTE — Consult Note (Addendum)
[facility] Consult Note  Referring Provider: No ref. provider found Primary Care Physician:  Ricki Rodriguez, MD Primary Gastroenterologist:  Dr.  Antony Contras Complaint: Rectal bleeding HPI: Patricia English is an 79 y.o. black female  presented yesterday with onset of bright red blood per rectum while wiping after a bowel movement. There is no pain involved the bleeding continued. She went to the emergency room and was found to have an elevated INR related to Coumadin use. She had a hemoglobin of 11.7 hemorrhoids on rectal exam and was admitted for further evaluation. The patient has not had a bowel movement since admission. Hemoglobin today is 10.8 with an INR of 2.98. She is not having pain . She states she had a colonoscopy 3 years ago but I cannot find any record of it in our system or the [place] system. She did not think there any significant findings. Past Medical History  Diagnosis Date  . Coronary artery disease   . Diabetes mellitus without complication   . Hypertension     History reviewed. No pertinent past surgical history.  Medications Prior to Admission  Medication Sig Dispense Refill  . digoxin (LANOXIN) 0.25 MG tablet Take 0.25 mg by mouth daily.      Marland Kitchen glipiZIDE (GLUCOTROL XL) 5 MG 24 hr tablet Take 5 mg by mouth daily.      . isosorbide mononitrate (IMDUR) 30 MG 24 hr tablet Take 30 mg by mouth daily.      Marland Kitchen lisinopril (PRINIVIL,ZESTRIL) 5 MG tablet Take 5 mg by mouth daily.      . metFORMIN (GLUCOPHAGE) 500 MG tablet Take 1,000 mg by mouth 2 (two) times daily with a meal.      . metoprolol (LOPRESSOR) 50 MG tablet Take 25-50 mg by mouth 2 (two) times daily. Takes 1/2 tablet (25 mg) in the morning and 1 tablet (50mg ) at night      . rosuvastatin (CRESTOR) 10 MG tablet Take 10 mg by mouth every evening.      . warfarin (COUMADIN) 6 MG tablet Take 3-6 mg by mouth daily. Takes 1 tablet (6mg ) every day except Sunday; On Sunday takes 1/2 tablet (3mg )         Allergies: No Known Allergies  History reviewed. No pertinent family history.  Social History:  reports that she has never smoked. She does not have any smokeless tobacco history on file. She reports that she does not drink alcohol. Her drug history is not on file.  Review of Systems: negative except as above   Blood pressure 140/64, pulse 60, temperature 98.1 F (36.7 C), temperature source Oral, resp. rate 24, height 5\' 9"  (1.753 m), weight 79.2 kg (174 lb 9.7 oz), SpO2 100.00%. Head: Normocephalic, without obvious abnormality, atraumatic Neck: no adenopathy, no carotid bruit, no JVD, supple, symmetrical, trachea midline and thyroid not enlarged, symmetric, no tenderness/mass/nodules Resp: clear to auscultation bilaterally Cardio: regular rate and rhythm, S1, S2 normal, no murmur, click, rub or gallop GI: Abdomen soft nondistended with normoactive bowel sounds. No hepatomegaly masses or guarding Extremities: extremities normal, atraumatic, no cyanosis or edema  Results for orders placed during the hospital encounter of 10/13/12 (from the past 48 hour(s))  CBC WITH DIFFERENTIAL     Status: Abnormal   Collection Time    10/13/12  1:14 PM      Result Value Range   WBC 10.3  4.0 - 10.5 K/uL   RBC 3.77 (*) 3.87 - 5.11 MIL/uL   Hemoglobin 11.7 (*) 12.0 -  15.0 g/dL   HCT 16.1 (*) 09.6 - 04.5 %   MCV 88.6  78.0 - 100.0 fL   MCH 31.0  26.0 - 34.0 pg   MCHC 35.0  30.0 - 36.0 g/dL   RDW 40.9  81.1 - 91.4 %   Platelets 266  150 - 400 K/uL   Neutrophils Relative % 64  43 - 77 %   Neutro Abs 6.5  1.7 - 7.7 K/uL   Lymphocytes Relative 24  12 - 46 %   Lymphs Abs 2.5  0.7 - 4.0 K/uL   Monocytes Relative 8  3 - 12 %   Monocytes Absolute 0.8  0.1 - 1.0 K/uL   Eosinophils Relative 4  0 - 5 %   Eosinophils Absolute 0.4  0.0 - 0.7 K/uL   Basophils Relative 0  0 - 1 %   Basophils Absolute 0.0  0.0 - 0.1 K/uL  COMPREHENSIVE METABOLIC PANEL     Status: Abnormal   Collection Time     10/13/12  1:14 PM      Result Value Range   Sodium 139  135 - 145 mEq/L   Potassium 4.2  3.5 - 5.1 mEq/L   Chloride 106  96 - 112 mEq/L   CO2 21  19 - 32 mEq/L   Glucose, Bld 100 (*) 70 - 99 mg/dL   BUN 17  6 - 23 mg/dL   Creatinine, Ser 7.82  0.50 - 1.10 mg/dL   Calcium 8.9  8.4 - 95.6 mg/dL   Total Protein 7.6  6.0 - 8.3 g/dL   Albumin 3.9  3.5 - 5.2 g/dL   AST 16  0 - 37 U/L   ALT 13  0 - 35 U/L   Alkaline Phosphatase 50  39 - 117 U/L   Total Bilirubin 0.6  0.3 - 1.2 mg/dL   GFR calc non Af Amer 49 (*) >90 mL/min   GFR calc Af Amer 56 (*) >90 mL/min   Comment: (NOTE)     The eGFR has been calculated using the CKD EPI equation.     This calculation has not been validated in all clinical situations.     eGFR's persistently <90 mL/min signify possible Chronic Kidney     Disease.  LIPASE, BLOOD     Status: None   Collection Time    10/13/12  1:14 PM      Result Value Range   Lipase 46  11 - 59 U/L  TYPE AND SCREEN     Status: None   Collection Time    10/13/12  1:14 PM      Result Value Range   ABO/RH(D) O POS     Antibody Screen NEG     Sample Expiration 10/16/2012    TROPONIN I     Status: None   Collection Time    10/13/12  1:14 PM      Result Value Range   Troponin I <0.30  <0.30 ng/mL   Comment:            Due to the release kinetics of cTnI,     a negative result within the first hours     of the onset of symptoms does not rule out     myocardial infarction with certainty.     If myocardial infarction is still suspected,     repeat the test at appropriate intervals.  PROTIME-INR     Status: Abnormal   Collection Time    10/13/12  1:14 PM      Result Value Range   Prothrombin Time 33.5 (*) 11.6 - 15.2 seconds   INR 3.46 (*) 0.00 - 1.49  HEMOGLOBIN A1C     Status: Abnormal   Collection Time    10/13/12  1:14 PM      Result Value Range   Hemoglobin A1C 5.9 (*) <5.7 %   Comment: (NOTE)                                                                                According to the ADA Clinical Practice Recommendations for 2011, when     HbA1c is used as a screening test:      >=6.5%   Diagnostic of Diabetes Mellitus               (if abnormal result is confirmed)     5.7-6.4%   Increased risk of developing Diabetes Mellitus     References:Diagnosis and Classification of Diabetes Mellitus,Diabetes     Care,2011,34(Suppl 1):S62-S69 and Standards of Medical Care in             Diabetes - 2011,Diabetes Care,2011,34 (Suppl 1):S11-S61.   Mean Plasma Glucose 123 (*) <117 mg/dL   Comment: Performed at [facility] BLOOD, POC DEVICE     Status: Abnormal   Collection Time    10/13/12  1:26 PM      Result Value Range   Fecal Occult Bld POSITIVE (*) NEGATIVE  GLUCOSE, CAPILLARY     Status: Abnormal   Collection Time    10/13/12  3:01 PM      Result Value Range   Glucose-Capillary 59 (*) 70 - 99 mg/dL  URINALYSIS, ROUTINE W REFLEX MICROSCOPIC     Status: Abnormal   Collection Time    10/13/12  3:24 PM      Result Value Range   Color, Urine YELLOW  YELLOW   APPearance CLOUDY (*) CLEAR   Specific Gravity, Urine 1.017  1.005 - 1.030   pH 5.0  5.0 - 8.0   Glucose, UA NEGATIVE  NEGATIVE mg/dL   Hgb urine dipstick LARGE (*) NEGATIVE   Bilirubin Urine NEGATIVE  NEGATIVE   Ketones, ur NEGATIVE  NEGATIVE mg/dL   Protein, ur NEGATIVE  NEGATIVE mg/dL   Urobilinogen, UA 0.2  0.0 - 1.0 mg/dL   Nitrite POSITIVE (*) NEGATIVE   Leukocytes, UA SMALL (*) NEGATIVE  URINE MICROSCOPIC-ADD ON     Status: Abnormal   Collection Time    10/13/12  3:24 PM      Result Value Range   Squamous Epithelial / LPF FEW (*) RARE   WBC, UA 7-10  <3 WBC/hpf   RBC / HPF 0-2  <3 RBC/hpf   Bacteria, UA MANY (*) RARE  GLUCOSE, CAPILLARY     Status: Abnormal   Collection Time    10/13/12  3:58 PM      Result Value Range   Glucose-Capillary 103 (*) 70 - 99 mg/dL  MRSA PCR SCREENING     Status: None   Collection Time    10/13/12  4:00 PM      Result Value  Range   MRSA  by PCR NEGATIVE  NEGATIVE   Comment:            The GeneXpert MRSA Assay (FDA     approved for NASAL specimens     only), is one component of a     comprehensive MRSA colonization     surveillance program. It is not     intended to diagnose MRSA     infection nor to guide or     monitor treatment for     MRSA infections.  GLUCOSE, CAPILLARY     Status: Abnormal   Collection Time    10/13/12  5:10 PM      Result Value Range   Glucose-Capillary 121 (*) 70 - 99 mg/dL  GLUCOSE, CAPILLARY     Status: Abnormal   Collection Time    10/13/12 10:03 PM      Result Value Range   Glucose-Capillary 69 (*) 70 - 99 mg/dL  GLUCOSE, CAPILLARY     Status: Abnormal   Collection Time    10/13/12 10:33 PM      Result Value Range   Glucose-Capillary 102 (*) 70 - 99 mg/dL  BASIC METABOLIC PANEL     Status: Abnormal   Collection Time    10/14/12  5:06 AM      Result Value Range   Sodium 139  135 - 145 mEq/L   Potassium 4.0  3.5 - 5.1 mEq/L   Chloride 108  96 - 112 mEq/L   CO2 20  19 - 32 mEq/L   Glucose, Bld 82  70 - 99 mg/dL   BUN 11  6 - 23 mg/dL   Creatinine, Ser 4.09  0.50 - 1.10 mg/dL   Calcium 8.4  8.4 - 81.1 mg/dL   GFR calc non Af Amer 76 (*) >90 mL/min   GFR calc Af Amer 88 (*) >90 mL/min   Comment: (NOTE)     The eGFR has been calculated using the CKD EPI equation.     This calculation has not been validated in all clinical situations.     eGFR's persistently <90 mL/min signify possible Chronic Kidney     Disease.  PROTIME-INR     Status: Abnormal   Collection Time    10/14/12  5:06 AM      Result Value Range   Prothrombin Time 29.9 (*) 11.6 - 15.2 seconds   INR 2.98 (*) 0.00 - 1.49  CBC     Status: Abnormal   Collection Time    10/14/12  5:06 AM      Result Value Range   WBC 9.9  4.0 - 10.5 K/uL   RBC 3.56 (*) 3.87 - 5.11 MIL/uL   Hemoglobin 10.8 (*) 12.0 - 15.0 g/dL   HCT 91.4 (*) 78.2 - 95.6 %   MCV 87.4  78.0 - 100.0 fL   MCH 30.3  26.0 - 34.0 pg    MCHC 34.7  30.0 - 36.0 g/dL   RDW 21.3  08.6 - 57.8 %   Platelets 236  150 - 400 K/uL  GLUCOSE, CAPILLARY     Status: None   Collection Time    10/14/12  8:07 AM      Result Value Range   Glucose-Capillary 84  70 - 99 mg/dL   No results found.  Assessment: Rectal bleeding associated with elevated INR suspect distal rectal source possibly even hemorrhoidal versus diverticular Plan:  Will monitor stools and hemoglobin and correct coagulopathy. Will try to  determine how recent  or colonoscopy was and  she may need another one. If bleeding stops and coagulopathy corrected this could be followed up as an outpatient. Conley Pawling C 10/14/2012, 9:37 AM

## 2012-10-14 NOTE — Progress Notes (Signed)
  Echocardiogram 2D Echocardiogram has been performed.  Georgian Co 10/14/2012, 2:18 PM

## 2012-10-14 NOTE — Progress Notes (Signed)
Subjective:  Feeling better. Decreased lower GI bleed.  Objective:  Vital Signs in the last 24 hours: Temp:  [98 F (36.7 C)-98.3 F (36.8 C)] 98.1 F (36.7 C) (08/31 0800) Pulse Rate:  [60-83] 60 (08/31 0800) Cardiac Rhythm:  [-] Normal sinus rhythm;Heart block (08/31 0800) Resp:  [14-24] 24 (08/31 0900) BP: (98-150)/(45-65) 140/64 mmHg (08/31 0800) SpO2:  [98 %-100 %] 100 % (08/31 0900) Weight:  [78.744 kg (173 lb 9.6 oz)-79.2 kg (174 lb 9.7 oz)] 79.2 kg (174 lb 9.7 oz) (08/30 1602)  Physical Exam: BP Readings from Last 1 Encounters:  10/14/12 140/64     Wt Readings from Last 1 Encounters:  10/13/12 79.2 kg (174 lb 9.7 oz)    Weight change:   HEENT: [place]/AT, Eyes-Brown, PERL, EOMI, Conjunctiva-Pale pink, Sclera-Non-icteric Neck: No JVD, No bruit, Trachea midline. Lungs:  Clear, Bilateral. Cardiac:  Regular rhythm, normal S1 and S2, no S3.  Abdomen:  Soft, non-tender. Extremities:  No edema present. No cyanosis. No clubbing. CNS: AxOx3, Cranial nerves grossly intact, moves all 4 extremities. Right handed. Skin: Warm and dry.   Intake/Output from previous day: 08/30 0701 - 08/31 0700 In: 1220 [P.O.:220; I.V.:1000] Out: 2025 [Urine:2025]    Lab Results: BMET    Component Value Date/Time   NA 139 10/14/2012 0506   K 4.0 10/14/2012 0506   CL 108 10/14/2012 0506   CO2 20 10/14/2012 0506   GLUCOSE 82 10/14/2012 0506   BUN 11 10/14/2012 0506   CREATININE 0.78 10/14/2012 0506   CALCIUM 8.4 10/14/2012 0506   GFRNONAA 76* 10/14/2012 0506   GFRAA 88* 10/14/2012 0506   CBC    Component Value Date/Time   WBC 9.9 10/14/2012 0506   RBC 3.56* 10/14/2012 0506   HGB 10.8* 10/14/2012 0506   HCT 31.1* 10/14/2012 0506   PLT 236 10/14/2012 0506   MCV 87.4 10/14/2012 0506   MCH 30.3 10/14/2012 0506   MCHC 34.7 10/14/2012 0506   RDW 13.2 10/14/2012 0506   LYMPHSABS 2.5 10/13/2012 1314   MONOABS 0.8 10/13/2012 1314   EOSABS 0.4 10/13/2012 1314   BASOSABS 0.0 10/13/2012 1314   CARDIAC  ENZYMES Lab Results  Component Value Date   TROPONINI <0.30 10/13/2012    Scheduled Meds: . atorvastatin  10 mg Oral q1800  . insulin aspart  0-9 Units Subcutaneous TID WC  . insulin aspart  3 Units Subcutaneous TID WC  . metoprolol tartrate  50 mg Oral QHS  . metoprolol tartrate  25 mg Oral Daily  . phytonadione  5 mg Oral Once  . sodium chloride  3 mL Intravenous Q12H   Continuous Infusions: . sodium chloride Stopped (10/13/12 1852)   PRN Meds:.acetaminophen, acetaminophen  Assessment/Plan: Acute lower GI bleed  Dizziness due to above.  Acute blood loss anemia  DM, II  Continue medical treatment.    LOS: 1 day    Orpah Cobb  MD  10/14/2012, 9:38 AM

## 2012-10-15 LAB — BASIC METABOLIC PANEL
BUN: 7 mg/dL (ref 6–23)
CO2: 22 mEq/L (ref 19–32)
Chloride: 108 mEq/L (ref 96–112)
Creatinine, Ser: 0.8 mg/dL (ref 0.50–1.10)
GFR calc Af Amer: 77 mL/min — ABNORMAL LOW (ref >90)
Glucose, Bld: 100 mg/dL — ABNORMAL HIGH (ref 70–99)
Potassium: 3.7 mEq/L (ref 3.5–5.1)

## 2012-10-15 LAB — CBC
HCT: 31.3 % — ABNORMAL LOW (ref 36.0–46.0)
Hemoglobin: 10.7 g/dL — ABNORMAL LOW (ref 12.0–15.0)
MCV: 87.9 fL (ref 78.0–100.0)
RBC: 3.56 MIL/uL — ABNORMAL LOW (ref 3.87–5.11)
WBC: 9.1 10*3/uL (ref 4.0–10.5)

## 2012-10-15 LAB — URINE CULTURE: Colony Count: >=100000

## 2012-10-15 LAB — GLUCOSE, CAPILLARY
Glucose-Capillary: 110 mg/dL — ABNORMAL HIGH (ref 70–99)
Glucose-Capillary: 104 mg/dL — ABNORMAL HIGH (ref 70–99)

## 2012-10-15 LAB — PROTIME-INR: INR: 1.76 — ABNORMAL HIGH (ref 0.00–1.49)

## 2012-10-15 MED ORDER — WARFARIN SODIUM 6 MG PO TABS
6.0000 mg | ORAL_TABLET | Freq: Once | ORAL | Status: AC
  Administered 2012-10-15: 6 mg via ORAL
  Filled 2012-10-15: qty 1

## 2012-10-15 MED ORDER — WARFARIN - PHYSICIAN DOSING INPATIENT: Freq: Every day | Status: DC

## 2012-10-15 MED ORDER — WARFARIN SODIUM 6 MG PO TABS: 3.0000 mg | ORAL_TABLET | Freq: Every day | ORAL | Status: DC

## 2012-10-15 MED ORDER — CIPROFLOXACIN HCL 250 MG PO TABS: 250.0000 mg | ORAL_TABLET | Freq: Two times a day (BID) | ORAL | Status: DC

## 2012-10-15 NOTE — Progress Notes (Signed)
[facility] Progress Note  Subjective: Patient states that she has had 2 or 3 green stools with no visible blood at all since yesterday.  Objective: Vital signs in last 24 hours: Temp:  [98 F (36.7 C)-98.4 F (36.9 C)] 98.4 F (36.9 C) (09/01 0823) Pulse Rate:  [62-70] 68 (09/01 0823) Resp:  [18-25] 24 (09/01 0823) BP: (102-155)/(63-81) 102/81 mmHg (09/01 0823) SpO2:  [98 %-100 %] 100 % (09/01 0823) Weight:  [79.6 kg (175 lb 7.8 oz)] 79.6 kg (175 lb 7.8 oz) (09/01 0400) Weight change: 0.856 kg (1 lb 14.2 oz)   PE: Alert and oriented  Lab Results: Results for orders placed during the hospital encounter of 10/13/12 (from the past 24 hour(s))  GLUCOSE, CAPILLARY     Status: Abnormal   Collection Time    10/14/12 12:12 PM      Result Value Range   Glucose-Capillary 111 (*) 70 - 99 mg/dL  GLUCOSE, CAPILLARY     Status: None   Collection Time    10/14/12  5:47 PM      Result Value Range   Glucose-Capillary 84  70 - 99 mg/dL  GLUCOSE, CAPILLARY     Status: None   Collection Time    10/14/12 10:14 PM      Result Value Range   Glucose-Capillary 95  70 - 99 mg/dL  PROTIME-INR     Status: Abnormal   Collection Time    10/15/12  5:20 AM      Result Value Range   Prothrombin Time 20.0 (*) 11.6 - 15.2 seconds   INR 1.76 (*) 0.00 - 1.49  CBC     Status: Abnormal   Collection Time    10/15/12  5:20 AM      Result Value Range   WBC 9.1  4.0 - 10.5 K/uL   RBC 3.56 (*) 3.87 - 5.11 MIL/uL   Hemoglobin 10.7 (*) 12.0 - 15.0 g/dL   HCT 81.1 (*) 91.4 - 78.2 %   MCV 87.9  78.0 - 100.0 fL   MCH 30.1  26.0 - 34.0 pg   MCHC 34.2  30.0 - 36.0 g/dL   RDW 95.6  21.3 - 08.6 %   Platelets 246  150 - 400 K/uL  BASIC METABOLIC PANEL     Status: Abnormal   Collection Time    10/15/12  5:20 AM      Result Value Range   Sodium 141  135 - 145 mEq/L   Potassium 3.7  3.5 - 5.1 mEq/L   Chloride 108  96 - 112 mEq/L   CO2 22  19 - 32 mEq/L   Glucose, Bld 100 (*) 70 - 99 mg/dL   BUN  7  6 - 23 mg/dL   Creatinine, Ser 5.78  0.50 - 1.10 mg/dL   Calcium 8.4  8.4 - 46.9 mg/dL   GFR calc non Af Amer 67 (*) >90 mL/min   GFR calc Af Amer 77 (*) >90 mL/min  GLUCOSE, CAPILLARY     Status: Abnormal   Collection Time    10/15/12  7:55 AM      Result Value Range   Glucose-Capillary 110 (*) 70 - 99 mg/dL   Comment 1 Notify RN      Studies/Results: No results found.    Assessment: Rectal bleeding associated with elevated INR, suspect perianal. Hemoglobin stable and stools and reportedly nonbloody now.  Plan: Patient is quite certain she had a colonoscopy 3 years ago and apparently gave the  nurse the name of the doctor who did it. As long as this can be verified and she is not due for repeat I think we can hold off on colonoscopy and simply get her INR back within normal range. Will follow at distance. Please call if you feel inpatient colonoscopy needed.    Roma Bondar C 10/15/2012, 10:04 AM

## 2012-10-15 NOTE — Discharge Summary (Signed)
Physician Discharge Summary  Patient ID: Patricia English MRN: [id number] DOB/AGE: March 13, 1927 79 y.o.  Admit date: 10/13/2012 Discharge date: 10/15/2012  Admission Diagnoses: Acute lower GI bleed  Dizziness due to above.  Acute blood loss anemia  DM, II  Discharge Diagnoses:  Principle Problems: * Acute lower GI bleed * Dizziness due to above.  Acute blood loss anemia  DM, II  Discharged Condition: fair  Hospital Course: 79 year old female with chronic coumadin use for paroxysmal atrial fibrillation had acute GI bleed per rectum without pain. She has orthostatic dizziness also. She had GI consult and responded well to holding coumadin,  vitamin K use, IV Protonix and clear liquid diet. Her GI bleed appeared to be from perianal area. Her Hgb remained stable post initial drop by 1 gm/dl. She was discharged home in stable condition.  Consults: GI  Significant Diagnostic Studies: labs: Normal electrolytes, Hgb of 11.7 on admission to 10.7 on day of discharge.   EKG-NSR.  2 D Echocardiogram: - Left ventricle: The cavity size was normal. Systolic function was normal. The estimated ejection fraction was in the range of 55% to 60%. Wall motion was normal; there were no regional wall motion abnormalities. Doppler parameters are consistent with abnormal left ventricular relaxation (grade 1 diastolic dysfunction). - Mitral valve: Mild regurgitation.  Treatments: IV hydration and holding warfarin.  Discharge Exam: Blood pressure 102/81, pulse 68, temperature 98.7 F (37.1 C), temperature source Oral, resp. rate 24, height 5\' 9"  (1.753 m), weight 79.6 kg (175 lb 7.8 oz), SpO2 100.00%. HEENT: [place]/AT, Eyes-Brown, PERL, EOMI, Conjunctiva-Pale pink, Sclera-Non-icteric  Neck: No JVD, No bruit, Trachea midline.  Lungs: Clear, Bilateral.  Cardiac: Regular rhythm, normal S1 and S2, no S3.  Abdomen: Soft, non-tender.  Extremities: No edema present. No cyanosis. No clubbing.  CNS: AxOx3, Cranial  nerves grossly intact, moves all 4 extremities. Right handed.  Skin: Warm and dry.   Disposition: 01-Home or Self Care     Medication List         ciprofloxacin 250 MG tablet  Commonly known as:  CIPRO  Take 1 tablet (250 mg total) by mouth 2 (two) times daily.     digoxin 0.25 MG tablet  Commonly known as:  LANOXIN  Take 0.25 mg by mouth daily.     glipiZIDE 5 MG 24 hr tablet  Commonly known as:  GLUCOTROL XL  Take 5 mg by mouth daily.     isosorbide mononitrate 30 MG 24 hr tablet  Commonly known as:  IMDUR  Take 30 mg by mouth daily.     lisinopril 5 MG tablet  Commonly known as:  PRINIVIL,ZESTRIL  Take 5 mg by mouth daily.     metFORMIN 500 MG tablet  Commonly known as:  GLUCOPHAGE  Take 1,000 mg by mouth 2 (two) times daily with a meal.     metoprolol 50 MG tablet  Commonly known as:  LOPRESSOR  Take 25-50 mg by mouth 2 (two) times daily. Takes 1/2 tablet (25 mg) in the morning and 1 tablet (50mg ) at night     rosuvastatin 10 MG tablet  Commonly known as:  CRESTOR  Take 10 mg by mouth every evening.     warfarin 6 MG tablet  Commonly known as:  COUMADIN  Take 0.5-1 tablets (3-6 mg total) by mouth daily. Take 1 tablet (6mg ) every Monday, Wednesday, Friday and Sunday. Take 1/2 tablet (3mg ) on Tuesday, Thursday and Saturday.         Signed: Orpah Cobb  S 10/15/2012, 3:02 PM

## 2013-01-03 ENCOUNTER — Encounter (HOSPITAL_COMMUNITY): Payer: Self-pay | Admitting: Emergency Medicine

## 2013-01-03 ENCOUNTER — Observation Stay (HOSPITAL_COMMUNITY)
Admission: EM | Admit: 2013-01-03 | Discharge: 2013-01-04 | Disposition: A | Discharge status: Home / Self Care | Payer: Medicare Other | Attending: Cardiovascular Disease | Admitting: Cardiovascular Disease

## 2013-01-03 DIAGNOSIS — Z7901 Long term (current) use of anticoagulants: Secondary | ICD-10-CM | POA: Insufficient documentation

## 2013-01-03 DIAGNOSIS — K625 Hemorrhage of anus and rectum: Secondary | ICD-10-CM

## 2013-01-03 DIAGNOSIS — I2510 Atherosclerotic heart disease of native coronary artery without angina pectoris: Secondary | ICD-10-CM | POA: Insufficient documentation

## 2013-01-03 DIAGNOSIS — D62 Acute posthemorrhagic anemia: Secondary | ICD-10-CM | POA: Insufficient documentation

## 2013-01-03 DIAGNOSIS — E119 Type 2 diabetes mellitus without complications: Secondary | ICD-10-CM | POA: Insufficient documentation

## 2013-01-03 DIAGNOSIS — I10 Essential (primary) hypertension: Secondary | ICD-10-CM | POA: Insufficient documentation

## 2013-01-03 DIAGNOSIS — I4891 Unspecified atrial fibrillation: Secondary | ICD-10-CM | POA: Insufficient documentation

## 2013-01-03 DIAGNOSIS — K922 Gastrointestinal hemorrhage, unspecified: Principal | ICD-10-CM | POA: Insufficient documentation

## 2013-01-03 LAB — COMPREHENSIVE METABOLIC PANEL
ALT: 18 U/L (ref 0–35)
AST: 17 U/L (ref 0–37)
CO2: 23 mEq/L (ref 19–32)
Calcium: 9 mg/dL (ref 8.4–10.5)
Chloride: 105 mEq/L (ref 96–112)
Creatinine, Ser: 0.93 mg/dL (ref 0.50–1.10)
GFR calc Af Amer: 65 mL/min — ABNORMAL LOW (ref >90)
GFR calc non Af Amer: 56 mL/min — ABNORMAL LOW (ref >90)
Glucose, Bld: 144 mg/dL — ABNORMAL HIGH (ref 70–99)
Total Bilirubin: 0.4 mg/dL (ref 0.3–1.2)

## 2013-01-03 LAB — CBC
MCH: 29.2 pg (ref 26.0–34.0)
MCHC: 33.7 g/dL (ref 30.0–36.0)
Platelets: 319 10*3/uL (ref 150–400)

## 2013-01-03 LAB — PROTIME-INR
INR: 1.77 — ABNORMAL HIGH (ref 0.00–1.49)
Prothrombin Time: 20.1 seconds — ABNORMAL HIGH (ref 11.6–15.2)

## 2013-01-03 LAB — TYPE AND SCREEN

## 2013-01-03 MED ORDER — GLIPIZIDE ER 5 MG PO TB24
5.0000 mg | ORAL_TABLET | Freq: Every day | ORAL | Status: DC
  Administered 2013-01-04: 5 mg via ORAL
  Filled 2013-01-03 (×2): qty 1

## 2013-01-03 MED ORDER — ONDANSETRON HCL 4 MG/2ML IJ SOLN: 4.0000 mg | Freq: Four times a day (QID) | INTRAMUSCULAR | Status: DC | PRN

## 2013-01-03 MED ORDER — ALUM & MAG HYDROXIDE-SIMETH 200-200-20 MG/5ML PO SUSP: 30.0000 mL | Freq: Four times a day (QID) | ORAL | Status: DC | PRN

## 2013-01-03 MED ORDER — ACETAMINOPHEN 325 MG PO TABS: 650.0000 mg | ORAL_TABLET | Freq: Four times a day (QID) | ORAL | Status: DC | PRN

## 2013-01-03 MED ORDER — ACETAMINOPHEN 650 MG RE SUPP: 650.0000 mg | Freq: Four times a day (QID) | RECTAL | Status: DC | PRN

## 2013-01-03 MED ORDER — ONDANSETRON HCL 4 MG PO TABS: 4.0000 mg | ORAL_TABLET | Freq: Four times a day (QID) | ORAL | Status: DC | PRN

## 2013-01-03 MED ORDER — DIGOXIN 250 MCG PO TABS
0.2500 mg | ORAL_TABLET | Freq: Every day | ORAL | Status: DC
  Administered 2013-01-04: 0.25 mg via ORAL
  Filled 2013-01-03: qty 1

## 2013-01-03 MED ORDER — SODIUM CHLORIDE 0.9 % IV SOLN
INTRAVENOUS | Status: DC
  Administered 2013-01-03: 21:00:00 via INTRAVENOUS

## 2013-01-03 MED ORDER — ISOSORBIDE MONONITRATE ER 30 MG PO TB24
30.0000 mg | ORAL_TABLET | Freq: Every day | ORAL | Status: DC
  Administered 2013-01-04: 30 mg via ORAL
  Filled 2013-01-03: qty 1

## 2013-01-03 MED ORDER — DOCUSATE SODIUM 100 MG PO CAPS
100.0000 mg | ORAL_CAPSULE | Freq: Two times a day (BID) | ORAL | Status: DC
  Administered 2013-01-03 – 2013-01-04 (×2): 100 mg via ORAL
  Filled 2013-01-03 (×3): qty 1

## 2013-01-03 MED ORDER — METFORMIN HCL 500 MG PO TABS
1000.0000 mg | ORAL_TABLET | Freq: Two times a day (BID) | ORAL | Status: DC
  Administered 2013-01-04: 1000 mg via ORAL
  Filled 2013-01-03 (×3): qty 2

## 2013-01-03 MED ORDER — SODIUM CHLORIDE 0.9 % IJ SOLN
3.0000 mL | Freq: Two times a day (BID) | INTRAMUSCULAR | Status: DC
  Administered 2013-01-03: 3 mL via INTRAVENOUS

## 2013-01-03 MED ORDER — DILTIAZEM HCL ER 180 MG PO CP24
180.0000 mg | ORAL_CAPSULE | Freq: Two times a day (BID) | ORAL | Status: DC
Start: 2013-01-03 — End: 2013-01-04
  Administered 2013-01-03 – 2013-01-04 (×2): 180 mg via ORAL
  Filled 2013-01-03 (×3): qty 1

## 2013-01-03 MED ORDER — LISINOPRIL 5 MG PO TABS
5.0000 mg | ORAL_TABLET | Freq: Every day | ORAL | Status: DC
Start: 2013-01-03 — End: 2013-01-04
  Administered 2013-01-04: 5 mg via ORAL
  Filled 2013-01-03: qty 1

## 2013-01-03 MED ORDER — ATORVASTATIN CALCIUM 10 MG PO TABS
10.0000 mg | ORAL_TABLET | Freq: Every day | ORAL | Status: DC
  Administered 2013-01-03: 21:00:00 10 mg via ORAL
  Filled 2013-01-03 (×2): qty 1

## 2013-01-03 NOTE — H&P (Signed)
Patricia English is an 79 y.o. female.   Chief Complaint: Bright red bleeding from rectum.  HPI: 79 year old female with chronic coumadin use for paroxysmal atrial fibrillation has acute GI bleed per rectum without pain. No fever, nausea, vomiting or abdominal pain.   Past Medical History  Diagnosis Date  . Coronary artery disease   . Diabetes mellitus without complication   . Hypertension       History reviewed. No pertinent past surgical history.  History reviewed. No pertinent family history. Social History:  reports that she has never smoked. She does not have any smokeless tobacco history on file. She reports that she does not drink alcohol. Her drug history is not on file.  Allergies: No Known Allergies   (Not in a hospital admission)  Results for orders placed during the hospital encounter of 01/03/13 (from the past 48 hour(s))  PROTIME-INR     Status: Abnormal   Collection Time    01/03/13 12:38 PM      Result Value Range   Prothrombin Time 20.1 (*) 11.6 - 15.2 seconds   INR 1.77 (*) 0.00 - 1.49  COMPREHENSIVE METABOLIC PANEL     Status: Abnormal   Collection Time    01/03/13 12:38 PM      Result Value Range   Sodium 140  135 - 145 mEq/L   Potassium 4.3  3.5 - 5.1 mEq/L   Chloride 105  96 - 112 mEq/L   CO2 23  19 - 32 mEq/L   Glucose, Bld 144 (*) 70 - 99 mg/dL   BUN 15  6 - 23 mg/dL   Creatinine, Ser 1.61  0.50 - 1.10 mg/dL   Calcium 9.0  8.4 - 09.6 mg/dL   Total Protein 7.9  6.0 - 8.3 g/dL   Albumin 3.6  3.5 - 5.2 g/dL   AST 17  0 - 37 U/L   ALT 18  0 - 35 U/L   Alkaline Phosphatase 81  39 - 117 U/L   Total Bilirubin 0.4  0.3 - 1.2 mg/dL   GFR calc non Af Amer 56 (*) >90 mL/min   GFR calc Af Amer 65 (*) >90 mL/min   Comment: (NOTE)     The eGFR has been calculated using the CKD EPI equation.     This calculation has not been validated in all clinical situations.     eGFR's persistently <90 mL/min signify possible Chronic Kidney     Disease.  CBC      Status: Abnormal   Collection Time    01/03/13 12:38 PM      Result Value Range   WBC 10.5  4.0 - 10.5 K/uL   RBC 3.49 (*) 3.87 - 5.11 MIL/uL   Hemoglobin 10.2 (*) 12.0 - 15.0 g/dL   HCT 04.5 (*) 40.9 - 81.1 %   MCV 86.8  78.0 - 100.0 fL   MCH 29.2  26.0 - 34.0 pg   MCHC 33.7  30.0 - 36.0 g/dL   RDW 91.4  78.2 - 95.6 %   Platelets 319  150 - 400 K/uL  TYPE AND SCREEN     Status: None   Collection Time    01/03/13  1:10 PM      Result Value Range   ABO/RH(D) O POS     Antibody Screen NEG     Sample Expiration 01/06/2013    OCCULT BLOOD, POC DEVICE     Status: Abnormal   Collection Time  01/03/13  1:42 PM      Result Value Range   Fecal Occult Bld POSITIVE (*) NEGATIVE   No results found.  ROS Constitutional: Positive for activity change and fatigue. Negative for fever.  HENT: Negative for congestion and rhinorrhea.  Respiratory: Negative for cough, chest tightness and shortness of breath.  Cardiovascular: Negative for chest pain.  Gastrointestinal: Positive for blood in stool and hematochezia. Negative for nausea, vomiting, abdominal pain and diarrhea.  Genitourinary: Negative for dysuria, hematuria, vaginal bleeding and vaginal discharge.  Musculoskeletal: Positive for back pain.  Neurological: Positive for weakness and light-headedness. Negative for dizziness.   Blood pressure 126/52, pulse 63, temperature 98.6 F (37 C), temperature source Oral, resp. rate 19, height 5\' 7"  (1.702 m), weight 77.973 kg (171 lb 14.4 oz), SpO2 98.00%.  Physical Exam  Constitutional: She is oriented to person, place, and time. She appears well-developed and well-nourished. No distress.  HENT: Head: Normocephalic. Mouth/Throat: Oropharynx is clear and moist. No oropharyngeal exudate. Eyes: Manson Passey, Conjunctivae and EOM are normal. Pupils are equal, round, and reactive to light.  Neck: Normal range of motion. Neck supple.  Cardiovascular: Normal rate, regular rhythm and normal heart sounds.  II/VI systolic murmur.  Pulmonary/Chest: Effort normal and breath sounds normal. No respiratory distress.  Abdominal: Soft. There is no tenderness. There is no rebound and no guarding.  No abdominal tenderness  Genitourinary: Guaiac positive stool.  Musculoskeletal: Normal range of motion. She exhibits no edema and no tenderness.  Neurological: She is alert and oriented to person, place, and time. No cranial nerve deficit. She exhibits normal muscle tone. Coordination normal.  Skin: Skin is warm.   Assessment/Plan Acute lower GI bleed  Acute blood loss anemia  DM, II  Hold coumadin IV Fluids GI consult IP or OP. "Limited code"  Collin Rengel S 01/03/2013, 5:39 PM

## 2013-01-03 NOTE — Progress Notes (Signed)
Pt arrived to unit at 1858.

## 2013-01-03 NOTE — ED Notes (Signed)
Dr Kadakia at bedside 

## 2013-01-03 NOTE — ED Notes (Signed)
NOTIFIED DR. Leeann Must IN PERSON OF PATIENTS LAB RESULTS OF OCCULT BLOOD TEST RESULTS POSITIVE ( + ) @ 14:05 PM , 2014.

## 2013-01-03 NOTE — ED Provider Notes (Signed)
CSN: [id number]     Arrival date & time 01/03/13  1229 History   First MD Initiated Contact with Patient 01/03/13 1249     Chief Complaint  Patient presents with  . Rectal Bleeding   (Consider location/radiation/quality/duration/timing/severity/associated sxs/prior Treatment) Patient is a 79 y.o. female presenting with hematochezia. The history is provided by the patient and a relative.  Rectal Bleeding Associated symptoms: no abdominal pain, no epistaxis, no fever, no light-headedness and no vomiting   pt states had episode blood per rectum in shower today. States had similar episode a couple months ago, decision then made not to do endoscopy, pt reporting a normal colonoscopy approximately 3 yrs prior. Pt denies abd pain or nv. No recent constipation or straining, in fact states recently had mild diarrhea. Hx hemorrhoids. No hx diverticula, colon ca, avm or upper gi bleeding. No recent wt loss. Denies other abn bleeding or bruising, no vaginal bleeding, no hematuria. Denies fever or chills. No faintness or dizziness. Is on coumadin.      Past Medical History  Diagnosis Date  . Coronary artery disease   . Diabetes mellitus without complication   . Hypertension    History reviewed. No pertinent past surgical history. History reviewed. No pertinent family history. History  Substance Use Topics  . Smoking status: Never Smoker   . Smokeless tobacco: Not on file  . Alcohol Use: No   OB History   Grav Para Term Preterm Abortions TAB SAB Ect Mult Living                 Review of Systems  Constitutional: Negative for fever and chills.  HENT: Negative for nosebleeds.   Eyes: Negative for redness.  Respiratory: Negative for shortness of breath.   Cardiovascular: Negative for chest pain.  Gastrointestinal: Positive for hematochezia. Negative for vomiting and abdominal pain.  Genitourinary: Negative for hematuria, flank pain and vaginal bleeding.  Musculoskeletal: Negative for back  pain and neck pain.  Skin: Negative for rash.  Neurological: Negative for light-headedness.  Hematological: Does not bruise/bleed easily.  Psychiatric/Behavioral: Negative for confusion.    Allergies  Review of patient's allergies indicates no known allergies.  Home Medications   Current Outpatient Rx  Name  Route  Sig  Dispense  Refill  . ciprofloxacin (CIPRO) 250 MG tablet   Oral   Take 1 tablet (250 mg total) by mouth 2 (two) times daily.   6 tablet   0   . digoxin (LANOXIN) 0.25 MG tablet   Oral   Take 0.25 mg by mouth daily.         Marland Kitchen glipiZIDE (GLUCOTROL XL) 5 MG 24 hr tablet   Oral   Take 5 mg by mouth daily.         . isosorbide mononitrate (IMDUR) 30 MG 24 hr tablet   Oral   Take 30 mg by mouth daily.         Marland Kitchen lisinopril (PRINIVIL,ZESTRIL) 5 MG tablet   Oral   Take 5 mg by mouth daily.         . metFORMIN (GLUCOPHAGE) 500 MG tablet   Oral   Take 1,000 mg by mouth 2 (two) times daily with a meal.         . metoprolol (LOPRESSOR) 50 MG tablet   Oral   Take 25-50 mg by mouth 2 (two) times daily. Takes 1/2 tablet (25 mg) in the morning and 1 tablet (50mg ) at night         .  rosuvastatin (CRESTOR) 10 MG tablet   Oral   Take 10 mg by mouth every evening.         . warfarin (COUMADIN) 6 MG tablet   Oral   Take 0.5-1 tablets (3-6 mg total) by mouth daily. Take 1 tablet (6mg ) every Monday, Wednesday, Friday and Sunday. Take 1/2 tablet (3mg ) on Tuesday, Thursday and Saturday.          BP 128/57  Pulse 75  Temp(Src) 98.7 F (37.1 C) (Oral)  Resp 20  Ht 5\' 7"  (1.702 m)  Wt 171 lb 14.4 oz (77.973 kg)  BMI 26.92 kg/m2  SpO2 96% Physical Exam  Nursing note and vitals reviewed. Constitutional: She appears well-developed and well-nourished. No distress.  HENT:  Mouth/Throat: Oropharynx is clear and moist.  Eyes: Conjunctivae are normal. No scleral icterus.  Neck: Neck supple. No tracheal deviation present.  Cardiovascular: Normal rate,  normal heart sounds and intact distal pulses.   Pulmonary/Chest: Effort normal and breath sounds normal. No respiratory distress.  Abdominal: Soft. Normal appearance. She exhibits no distension and no mass. There is no tenderness. There is no rebound and no guarding.  Genitourinary:  Rectal, old skin tags/hemorrhoids, no acutely thrombosed or inflamed hemorrhoids. Light brown loose stool. No melena or gross blood noted. Heme positive.     Musculoskeletal: She exhibits no edema.  Neurological: She is alert.  Skin: Skin is warm and dry. No rash noted. She is not diaphoretic.  Psychiatric: She has a normal mood and affect.    ED Course  Procedures (including critical care time)  Results for orders placed during the hospital encounter of 01/03/13  PROTIME-INR      Result Value Range   Prothrombin Time 20.1 (*) 11.6 - 15.2 seconds   INR 1.77 (*) 0.00 - 1.49  COMPREHENSIVE METABOLIC PANEL      Result Value Range   Sodium 140  135 - 145 mEq/L   Potassium 4.3  3.5 - 5.1 mEq/L   Chloride 105  96 - 112 mEq/L   CO2 23  19 - 32 mEq/L   Glucose, Bld 144 (*) 70 - 99 mg/dL   BUN 15  6 - 23 mg/dL   Creatinine, Ser 4.69  0.50 - 1.10 mg/dL   Calcium 9.0  8.4 - 62.9 mg/dL   Total Protein 7.9  6.0 - 8.3 g/dL   Albumin 3.6  3.5 - 5.2 g/dL   AST 17  0 - 37 U/L   ALT 18  0 - 35 U/L   Alkaline Phosphatase 81  39 - 117 U/L   Total Bilirubin 0.4  0.3 - 1.2 mg/dL   GFR calc non Af Amer 56 (*) >90 mL/min   GFR calc Af Amer 65 (*) >90 mL/min  CBC      Result Value Range   WBC 10.5  4.0 - 10.5 K/uL   RBC 3.49 (*) 3.87 - 5.11 MIL/uL   Hemoglobin 10.2 (*) 12.0 - 15.0 g/dL   HCT 52.8 (*) 41.3 - 24.4 %   MCV 86.8  78.0 - 100.0 fL   MCH 29.2  26.0 - 34.0 pg   MCHC 33.7  30.0 - 36.0 g/dL   RDW 01.0  27.2 - 53.6 %   Platelets 319  150 - 400 K/uL  OCCULT BLOOD, POC DEVICE      Result Value Range   Fecal Occult Bld POSITIVE (*) NEGATIVE  TYPE AND SCREEN      Result Value Range   ABO/RH(D)  O POS      Antibody Screen NEG     Sample Expiration 01/06/2013        EKG Interpretation   None       MDM  Labs.  Reviewed nursing notes and prior charts for additional history.   hgb c/w prior.  Given rectal bleeding, on coumadin, pcp, Dr Algie Coffer called to see/obs - Dr Kirtland Bouchard will see in ED.  Recheck pt, comfortable, alert, abd soft nt. No recurrent rectal bleeding on recheck.      Suzi Roots, MD 01/03/13 [phone]

## 2013-01-03 NOTE — Progress Notes (Signed)
Assignment received at 1805 from charge nurse. Called for report from ED RN at 1811. Report ended at 24.

## 2013-01-03 NOTE — ED Notes (Signed)
Per pt sts was in the shower this am and noticed some rectal bleeding. Unsure of how much. Denies any other associated symptoms.

## 2013-01-03 NOTE — Progress Notes (Deleted)
Pt is requesting more pain meds although hasn't been 1 hour since last dose of pain med. Pt is also requesting vistaril, ativan, oxy, and immodium at the same time. RN explained to pt that she couldn't get all of those meds and paged MD on call for further clarification of pt's multiple prn meds for the same symptoms. rn awaiting further orders. As of now, pt is requesting prn meds q1 hour.   

## 2013-01-04 ENCOUNTER — Encounter (HOSPITAL_COMMUNITY): Payer: Self-pay | Admitting: Gastroenterology

## 2013-01-04 LAB — BASIC METABOLIC PANEL
BUN: 10 mg/dL (ref 6–23)
Calcium: 8.6 mg/dL (ref 8.4–10.5)
Chloride: 106 mEq/L (ref 96–112)
Creatinine, Ser: 0.84 mg/dL (ref 0.50–1.10)
GFR calc Af Amer: 73 mL/min — ABNORMAL LOW (ref >90)
GFR calc non Af Amer: 63 mL/min — ABNORMAL LOW (ref >90)
Sodium: 140 mEq/L (ref 135–145)

## 2013-01-04 LAB — CBC
MCHC: 33.9 g/dL (ref 30.0–36.0)
Platelets: 296 10*3/uL (ref 150–400)
RDW: 12.9 % (ref 11.5–15.5)
WBC: 8.6 10*3/uL (ref 4.0–10.5)

## 2013-01-04 LAB — PROTIME-INR
INR: 1.8 — ABNORMAL HIGH (ref 0.00–1.49)
Prothrombin Time: 20.4 seconds — ABNORMAL HIGH (ref 11.6–15.2)

## 2013-01-04 MED ORDER — DSS 100 MG PO CAPS: 100.0000 mg | ORAL_CAPSULE | Freq: Every day | ORAL | Status: DC

## 2013-01-04 NOTE — Discharge Summary (Signed)
Physician Discharge Summary  Patient ID: Patricia English MRN: [id number] DOB/AGE: [birth year] 79 y.o.  Admit date: 01/03/2013 Discharge date: 01/04/2013  Admission Diagnoses: Acute lower GI bleed  Acute blood loss anemia  DM, II  Discharge Diagnoses:  Principle Problem: * Acute lower GI bleed * Acute on chronic blood loss anemia  DM, II  Discharged Condition: fair  Hospital Course: 79 year old female with chronic coumadin use for paroxysmal atrial fibrillation had acute GI bleed per rectum without pain, fever or nausea/vomiting. Her warfarin was stopped. Her INR remained sub-therapeutic. She understood risk of bleeding with coumadin use and increased risk of clot or stroke without coumadin and agrees to try aspirin or Plavix after no additional bleed for 3-4 days. She was discharged home with follow up in 3-4 days.  Consults: GI  Significant Diagnostic Studies: labs: Normal WBC and Platelets count. Stable Hgb of 10.2. Normal electrolytes and liver function test.  Treatments: IV hydration and cardiac meds: lisinopril (Zestril), metoprolol, diltiazem and digoxin.  Discharge Exam: Blood pressure 129/66, pulse 99, temperature 98.6 F (37 C), temperature source Oral, resp. rate 20, height 5\' 7"  (1.702 m), weight 76.25 kg (168 lb 1.6 oz), SpO2 98.00%. Constitutional: She appears well-developed and well-nourished. No distress.  HENT: Head: Normocephalic. Mouth/Throat: Oropharynx is clear and moist. No oropharyngeal exudate. Eyes: Manson Passey, Conjunctivae and EOM are normal. Pupils are equal, round, and reactive to light.  Neck: Normal range of motion. Neck supple.  Cardiovascular: Normal rate, regular rhythm and normal heart sounds. II/VI systolic murmur.  Pulmonary/Chest: Effort normal and breath sounds normal. No respiratory distress.  Abdominal: Soft. There is no tenderness. There is no rebound and no guarding.  No abdominal tenderness  Musculoskeletal: Normal range of motion. She  exhibits no edema and no tenderness.  Neurological: She is alert and oriented to person, place, and time. No cranial nerve deficit. She exhibits normal muscle tone. Coordination normal.  Skin: Skin is warm.    Disposition: 01-Home or Self Care     Medication List    STOP taking these medications       warfarin 6 MG tablet  Commonly known as:  COUMADIN      TAKE these medications       Coenzyme Q10 100 MG Tabs  Take 100 mg by mouth daily.     digoxin 0.25 MG tablet  Commonly known as:  LANOXIN  Take 0.25 mg by mouth daily.     diltiazem 180 MG 24 hr capsule  Commonly known as:  DILACOR XR  Take 180 mg by mouth 2 (two) times daily.     DSS 100 MG Caps  Take 100 mg by mouth daily.     glipiZIDE 5 MG 24 hr tablet  Commonly known as:  GLUCOTROL XL  Take 5 mg by mouth daily.     isosorbide mononitrate 30 MG 24 hr tablet  Commonly known as:  IMDUR  Take 30 mg by mouth daily.     lisinopril 5 MG tablet  Commonly known as:  PRINIVIL,ZESTRIL  Take 5 mg by mouth daily.     metFORMIN 500 MG tablet  Commonly known as:  GLUCOPHAGE  Take 1,000 mg by mouth 2 (two) times daily with a meal.     metoprolol 50 MG tablet  Commonly known as:  LOPRESSOR  Take 25-50 mg by mouth 2 (two) times daily. Takes 1/2 tablet (25 mg) in the morning and 1 tablet (50mg ) at night     rosuvastatin 10 MG  tablet  Commonly known as:  CRESTOR  Take 10 mg by mouth every evening.           Follow-up Information   Follow up with [facility] S, MD. Schedule an appointment as soon as possible for a visit in 4 days.   Specialty:  Cardiology   Contact information:   [address] [place] [address] [phone]       Signed: Ricki Rodriguez 01/04/2013, 5:25 PM

## 2013-01-04 NOTE — Consult Note (Signed)
Reason for Consult: Bright red blood per rectum Referring Physician: Cardiology  Patricia English is an 79 y.o. female.  HPI: Patient with her second presumed self-limited diverticular bleeding and no other GI complaint or problem and her family history is negative and her colonoscopy from 4 years ago by my partner Dr. Evette Cristal was reviewed and she has no other complaints and has not had any further GI bleeding and her case was discussed with cardiology who plans to change her blood thinners probably from Coumadin to plavix Past Medical History  Diagnosis Date  . Coronary artery disease   . Diabetes mellitus without complication   . Hypertension     History reviewed. No pertinent past surgical history.  History reviewed. No pertinent family history.  Social History:  reports that she has never smoked. She does not have any smokeless tobacco history on file. She reports that she does not drink alcohol. Her drug history is not on file.  Allergies: No Known Allergies  Medications: I have reviewed the patient's current medications.  Results for orders placed during the hospital encounter of 01/03/13 (from the past 48 hour(s))  PROTIME-INR     Status: Abnormal   Collection Time    01/03/13 12:38 PM      Result Value Range   Prothrombin Time 20.1 (*) 11.6 - 15.2 seconds   INR 1.77 (*) 0.00 - 1.49  COMPREHENSIVE METABOLIC PANEL     Status: Abnormal   Collection Time    01/03/13 12:38 PM      Result Value Range   Sodium 140  135 - 145 mEq/L   Potassium 4.3  3.5 - 5.1 mEq/L   Chloride 105  96 - 112 mEq/L   CO2 23  19 - 32 mEq/L   Glucose, Bld 144 (*) 70 - 99 mg/dL   BUN 15  6 - 23 mg/dL   Creatinine, Ser 4.09  0.50 - 1.10 mg/dL   Calcium 9.0  8.4 - 81.1 mg/dL   Total Protein 7.9  6.0 - 8.3 g/dL   Albumin 3.6  3.5 - 5.2 g/dL   AST 17  0 - 37 U/L   ALT 18  0 - 35 U/L   Alkaline Phosphatase 81  39 - 117 U/L   Total Bilirubin 0.4  0.3 - 1.2 mg/dL   GFR calc non Af Amer 56 (*) >90 mL/min    GFR calc Af Amer 65 (*) >90 mL/min   Comment: (NOTE)     The eGFR has been calculated using the CKD EPI equation.     This calculation has not been validated in all clinical situations.     eGFR's persistently <90 mL/min signify possible Chronic Kidney     Disease.  CBC     Status: Abnormal   Collection Time    01/03/13 12:38 PM      Result Value Range   WBC 10.5  4.0 - 10.5 K/uL   RBC 3.49 (*) 3.87 - 5.11 MIL/uL   Hemoglobin 10.2 (*) 12.0 - 15.0 g/dL   HCT 91.4 (*) 78.2 - 95.6 %   MCV 86.8  78.0 - 100.0 fL   MCH 29.2  26.0 - 34.0 pg   MCHC 33.7  30.0 - 36.0 g/dL   RDW 21.3  08.6 - 57.8 %   Platelets 319  150 - 400 K/uL  TYPE AND SCREEN     Status: None   Collection Time    01/03/13  1:10 PM  Result Value Range   ABO/RH(D) O POS     Antibody Screen NEG     Sample Expiration 01/06/2013    OCCULT BLOOD, POC DEVICE     Status: Abnormal   Collection Time    01/03/13  1:42 PM      Result Value Range   Fecal Occult Bld POSITIVE (*) NEGATIVE  BASIC METABOLIC PANEL     Status: Abnormal   Collection Time    01/04/13  6:18 AM      Result Value Range   Sodium 140  135 - 145 mEq/L   Potassium 4.3  3.5 - 5.1 mEq/L   Chloride 106  96 - 112 mEq/L   CO2 23  19 - 32 mEq/L   Glucose, Bld 90  70 - 99 mg/dL   BUN 10  6 - 23 mg/dL   Creatinine, Ser 1.61  0.50 - 1.10 mg/dL   Calcium 8.6  8.4 - 09.6 mg/dL   GFR calc non Af Amer 63 (*) >90 mL/min   GFR calc Af Amer 73 (*) >90 mL/min   Comment: (NOTE)     The eGFR has been calculated using the CKD EPI equation.     This calculation has not been validated in all clinical situations.     eGFR's persistently <90 mL/min signify possible Chronic Kidney     Disease.  CBC     Status: Abnormal   Collection Time    01/04/13  6:18 AM      Result Value Range   WBC 8.6  4.0 - 10.5 K/uL   RBC 3.38 (*) 3.87 - 5.11 MIL/uL   Hemoglobin 10.0 (*) 12.0 - 15.0 g/dL   HCT 04.5 (*) 40.9 - 81.1 %   MCV 87.3  78.0 - 100.0 fL   MCH 29.6  26.0 - 34.0  pg   MCHC 33.9  30.0 - 36.0 g/dL   RDW 91.4  78.2 - 95.6 %   Platelets 296  150 - 400 K/uL  PROTIME-INR     Status: Abnormal   Collection Time    01/04/13  6:18 AM      Result Value Range   Prothrombin Time 20.4 (*) 11.6 - 15.2 seconds   INR 1.80 (*) 0.00 - 1.49    No results found.  ROS negative except above Blood pressure 136/74, pulse 93, temperature 98.6 F (37 C), temperature source Oral, resp. rate 20, height 5\' 7"  (1.702 m), weight 76.25 kg (168 lb 1.6 oz), SpO2 98.00%. Physical Exam vital signs stable afebrile no acute distress abdomen is soft nontender hemoglobin stable  Assessment/Plan: Bright red blood per probably diverticuli in a patient with history of colon polyps 4 years ago Plan: Will advance diet further if no further bleeding may go home soon and have recommended her followup with my partner Dr. Evette Cristal as an outpatient to proceed with a colonoscopy a little sooner than 5 years particularly with her on a lesser blood thinner just to make sure his previous polypectomy was removed and no other at risk lesions  Loras Grieshop E 01/04/2013, 1:04 PM

## 2013-01-04 NOTE — Progress Notes (Signed)
UR completed 

## 2013-01-04 NOTE — Progress Notes (Signed)
01/04/13 Patient being discharged home todayt. IV site removed,Discharge instructions reviewed with patient.

## 2013-11-22 ENCOUNTER — Emergency Department (HOSPITAL_COMMUNITY)
Admission: EM | Admit: 2013-11-22 | Discharge: 2013-11-22 | Disposition: A | Discharge status: Home / Self Care | Payer: Medicare Other | Attending: Emergency Medicine | Admitting: Emergency Medicine

## 2013-11-22 ENCOUNTER — Encounter (HOSPITAL_COMMUNITY): Payer: Self-pay | Admitting: Emergency Medicine

## 2013-11-22 ENCOUNTER — Emergency Department (HOSPITAL_COMMUNITY): Payer: Medicare Other

## 2013-11-22 DIAGNOSIS — I10 Essential (primary) hypertension: Secondary | ICD-10-CM | POA: Insufficient documentation

## 2013-11-22 DIAGNOSIS — M546 Pain in thoracic spine: Secondary | ICD-10-CM

## 2013-11-22 DIAGNOSIS — I2510 Atherosclerotic heart disease of native coronary artery without angina pectoris: Secondary | ICD-10-CM | POA: Diagnosis not present

## 2013-11-22 DIAGNOSIS — E119 Type 2 diabetes mellitus without complications: Secondary | ICD-10-CM | POA: Insufficient documentation

## 2013-11-22 DIAGNOSIS — Z79899 Other long term (current) drug therapy: Secondary | ICD-10-CM | POA: Diagnosis not present

## 2013-11-22 DIAGNOSIS — Z7982 Long term (current) use of aspirin: Secondary | ICD-10-CM | POA: Diagnosis not present

## 2013-11-22 DIAGNOSIS — R0602 Shortness of breath: Secondary | ICD-10-CM | POA: Diagnosis present

## 2013-11-22 DIAGNOSIS — R0600 Dyspnea, unspecified: Secondary | ICD-10-CM | POA: Insufficient documentation

## 2013-11-22 LAB — BASIC METABOLIC PANEL
ANION GAP: 14 (ref 5–15)
BUN: 17 mg/dL (ref 6–23)
CO2: 25 meq/L (ref 19–32)
Calcium: 9.3 mg/dL (ref 8.4–10.5)
Chloride: 101 mEq/L (ref 96–112)
Creatinine, Ser: 1 mg/dL (ref 0.50–1.10)
GFR calc Af Amer: 59 mL/min — ABNORMAL LOW (ref >90)
GFR calc non Af Amer: 51 mL/min — ABNORMAL LOW (ref >90)
Glucose, Bld: 108 mg/dL — ABNORMAL HIGH (ref 70–99)
Potassium: 3.8 mEq/L (ref 3.7–5.3)
SODIUM: 140 meq/L (ref 137–147)

## 2013-11-22 LAB — PRO B NATRIURETIC PEPTIDE: PRO B NATRI PEPTIDE: 173.4 pg/mL (ref 0–450)

## 2013-11-22 LAB — CBC
HCT: 37.8 % (ref 36.0–46.0)
Hemoglobin: 12.6 g/dL (ref 12.0–15.0)
MCH: 29.2 pg (ref 26.0–34.0)
MCHC: 33.3 g/dL (ref 30.0–36.0)
MCV: 87.7 fL (ref 78.0–100.0)
PLATELETS: 274 10*3/uL (ref 150–400)
RBC: 4.31 MIL/uL (ref 3.87–5.11)
RDW: 13.3 % (ref 11.5–15.5)
WBC: 10 10*3/uL (ref 4.0–10.5)

## 2013-11-22 LAB — I-STAT TROPONIN, ED: Troponin i, poc: 0.01 ng/mL (ref 0.00–0.08)

## 2013-11-22 LAB — TROPONIN I: Troponin I: <0.3 ng/mL (ref <0.30)

## 2013-11-22 IMAGING — CR DG CHEST 2V
2 series · 2 of 2 positions shown · non-contrast
Comparison: None available.

CLINICAL DATA: Shortness of breath and chest pain.

EXAM:
CHEST  2 VIEW

[w chest pa]
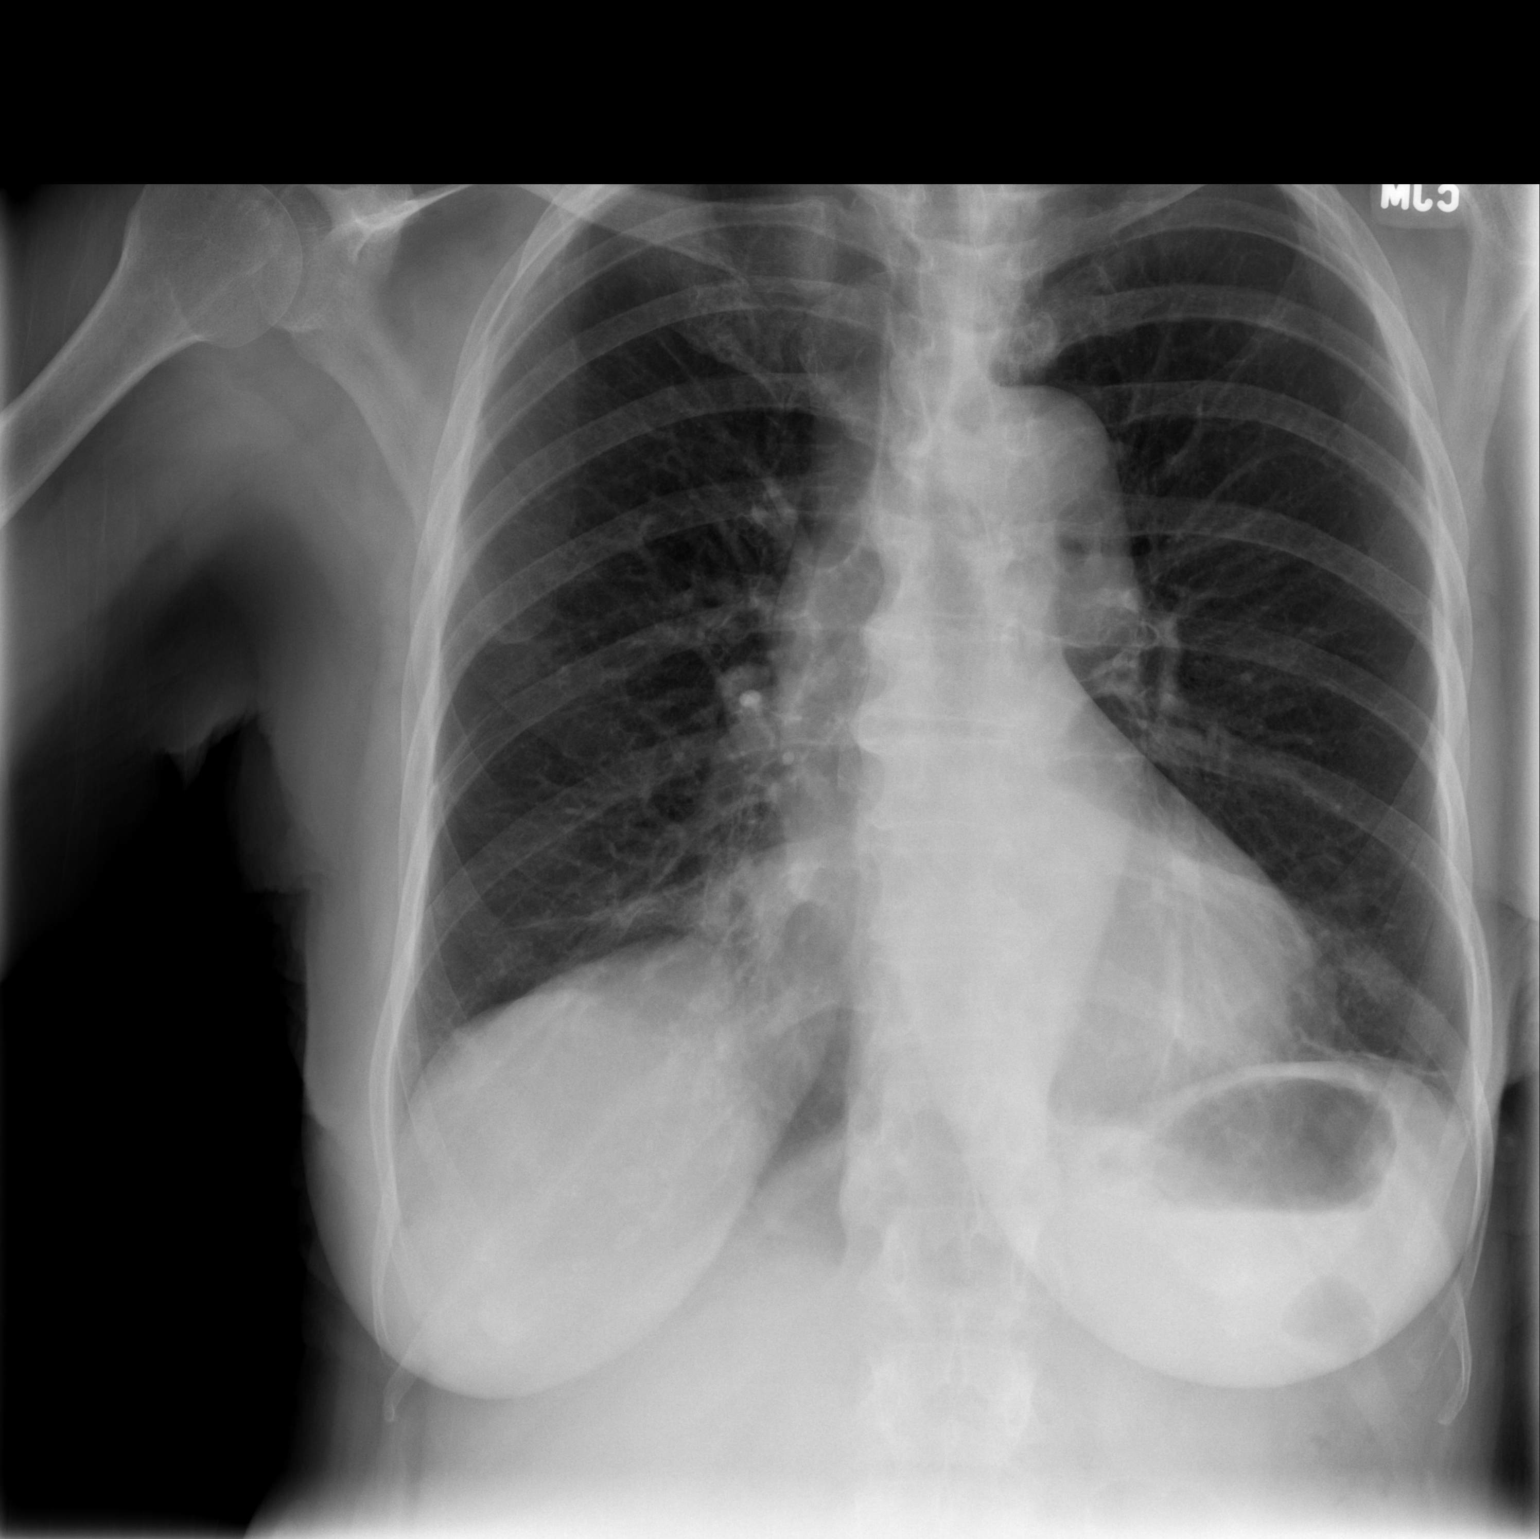

[w chest lat]
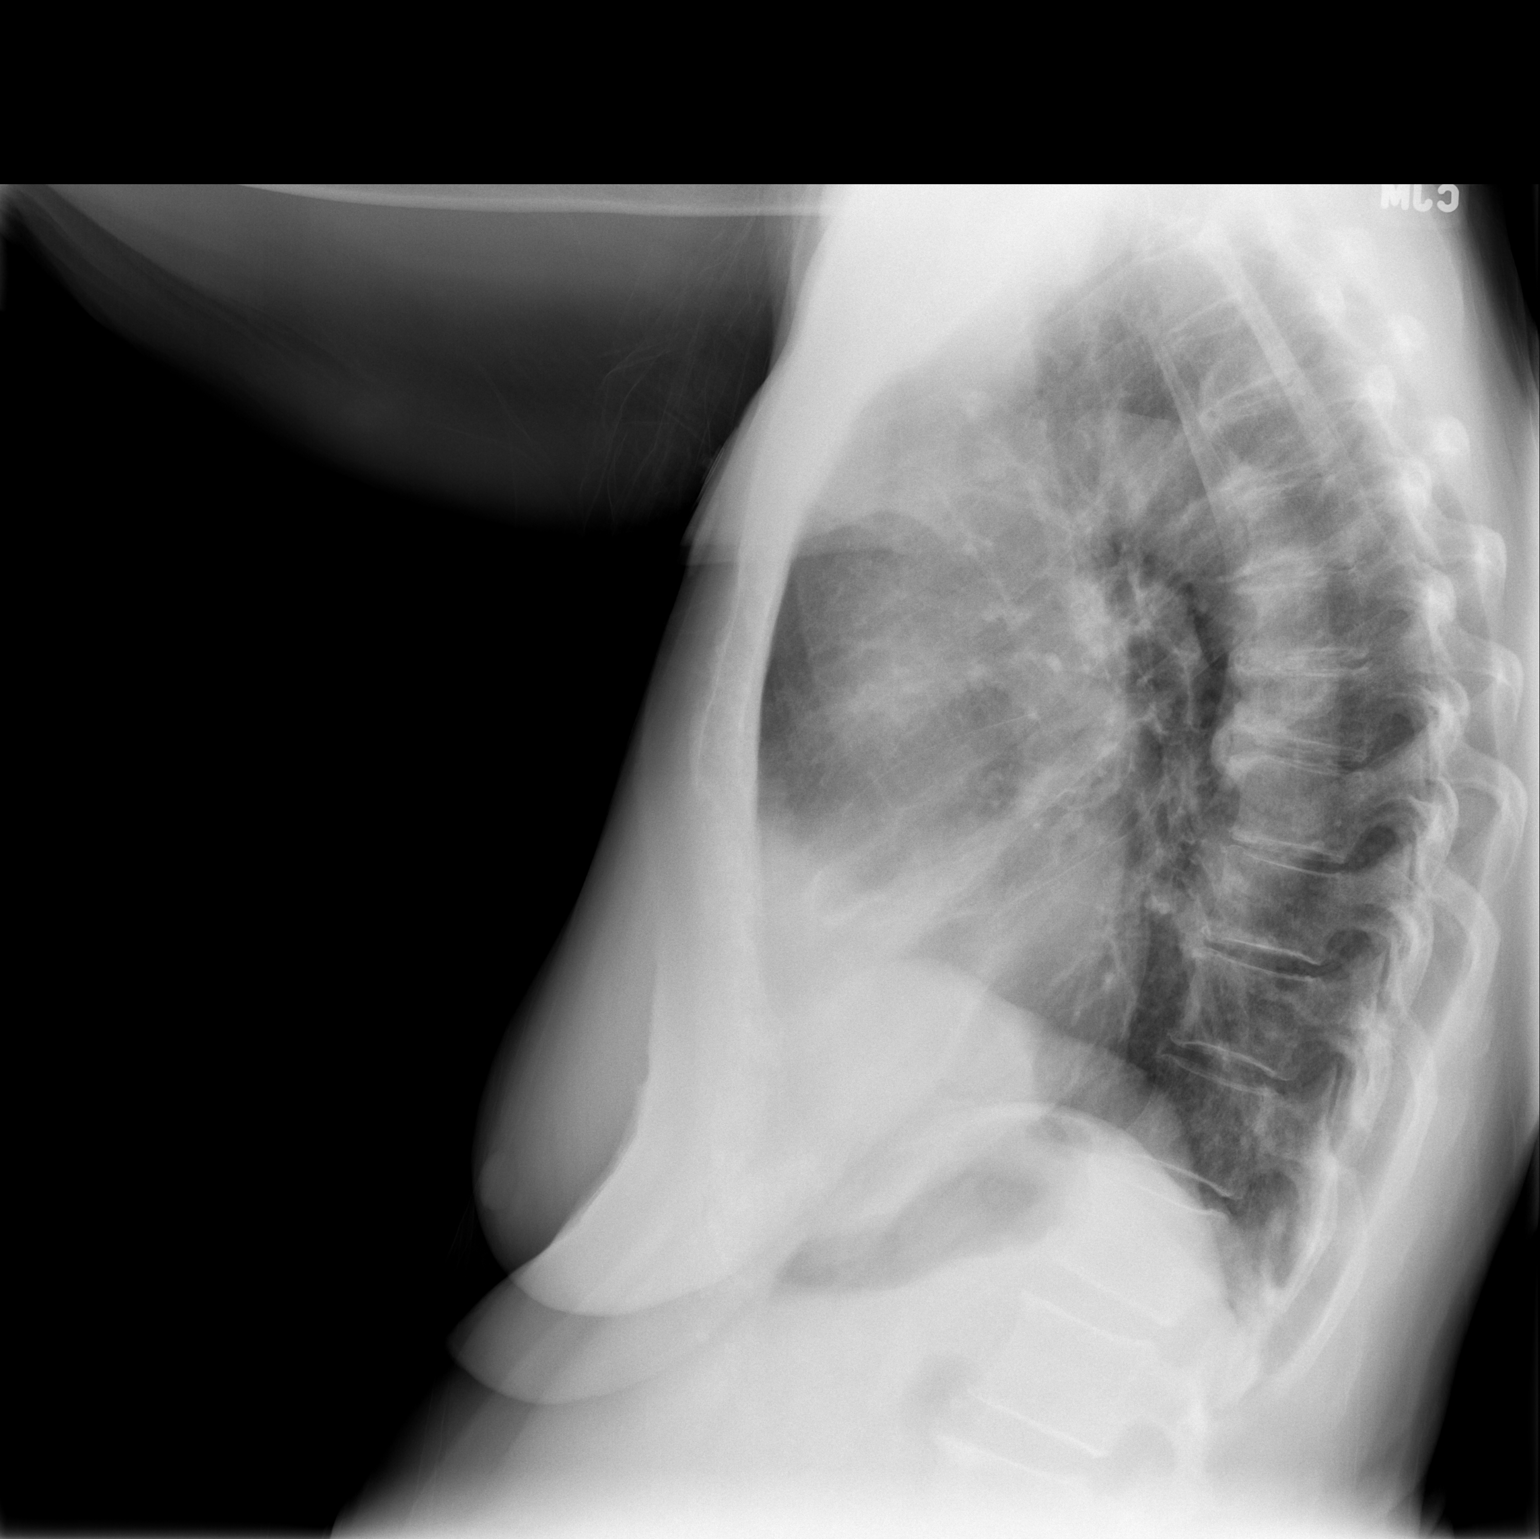

[2 of 2 positions shown; findings below may reference images not displayed]

FINDINGS: The cardiac silhouette, mediastinal and hilar contours are within
normal limits for age. There is tortuosity of the thoracic aorta.
Mild chronic appearing bronchitic type interstitial lung changes and
basilar scarring but no infiltrates, edema or effusions. The bony
thorax is intact. Moderate degenerative changes involving the mid
thoracic spine.
IMPRESSION: Chronic appearing lung changes without definite acute overlying
pulmonary process.

## 2013-11-22 NOTE — Discharge Instructions (Signed)

## 2013-11-22 NOTE — ED Notes (Signed)
Pt c/o sob all day  With pain between her shoulders getting worse after 1500 today.  She has pain in her lt lower ribs  When she breathes in.  No leg pain.

## 2013-11-22 NOTE — ED Provider Notes (Signed)
CSN: [id number]     Arrival date & time 11/22/13  1740 History   First MD Initiated Contact with Patient 11/22/13 2130     Chief Complaint  Patient presents with  . Shortness of Breath     (Consider location/radiation/quality/duration/timing/severity/associated sxs/prior Treatment) HPI Comments: Patient is an 80 year old female who presents for evaluation of tightness across her back between her shoulder blades that started at approximately 3PM.  She felt somewhat short of breath.  She had just eaten some seafood prior to the onset of symptoms.    Patient is a 80 y.o. female presenting with shortness of breath. The history is provided by the patient.  Shortness of Breath Severity:  Moderate Onset quality:  Sudden Duration:  2 hours Timing:  Constant Progression:  Unchanged Chronicity:  New Context: not activity   Relieved by:  Nothing Worsened by:  Nothing tried Ineffective treatments:  None tried Associated symptoms: no abdominal pain, no chest pain, no cough, no fever and no sputum production     Past Medical History  Diagnosis Date  . Coronary artery disease   . Diabetes mellitus without complication   . Hypertension    History reviewed. No pertinent past surgical history. No family history on file. History  Substance Use Topics  . Smoking status: Never Smoker   . Smokeless tobacco: Not on file  . Alcohol Use: No   OB History   Grav Para Term Preterm Abortions TAB SAB Ect Mult Living                 Review of Systems  Constitutional: Negative for fever.  Respiratory: Positive for shortness of breath. Negative for cough and sputum production.   Cardiovascular: Negative for chest pain.  Gastrointestinal: Negative for abdominal pain.  All other systems reviewed and are negative.     Allergies  Review of patient's allergies indicates no known allergies.  Home Medications   Prior to Admission medications   Medication Sig Start Date End Date Taking?  Authorizing Provider  aspirin EC 81 MG tablet Take 81 mg by mouth at bedtime.   Yes Historical Provider, MD  Coenzyme Q10 100 MG TABS Take 100 mg by mouth daily.   Yes Historical Provider, MD  digoxin (LANOXIN) 0.25 MG tablet Take 0.25 mg by mouth daily.   Yes Historical Provider, MD  diltiazem (DILACOR XR) 180 MG 24 hr capsule Take 180 mg by mouth 2 (two) times daily.   Yes Historical Provider, MD  glipiZIDE (GLUCOTROL XL) 5 MG 24 hr tablet Take 5 mg by mouth daily.   Yes Historical Provider, MD  isosorbide mononitrate (IMDUR) 30 MG 24 hr tablet Take 30 mg by mouth daily.   Yes Historical Provider, MD  lisinopril (PRINIVIL,ZESTRIL) 5 MG tablet Take 5 mg by mouth daily.   Yes Historical Provider, MD  metFORMIN (GLUCOPHAGE) 500 MG tablet Take 1,000 mg by mouth 2 (two) times daily with a meal.   Yes Historical Provider, MD  metoprolol (LOPRESSOR) 50 MG tablet Take 25-50 mg by mouth 2 (two) times daily. Takes 1/2 tablet (25 mg) in the morning and 1 tablet (50mg ) at night   Yes Historical Provider, MD  rosuvastatin (CRESTOR) 10 MG tablet Take 10 mg by mouth every evening.   Yes Historical Provider, MD   BP 155/67  Pulse 68  Temp(Src) 97.5 F (36.4 C) (Oral)  Resp 16  Ht 5\' 6"  (1.676 m)  Wt 170 lb (77.111 kg)  BMI 27.45 kg/m2  SpO2  100% Physical Exam  Nursing note and vitals reviewed. Constitutional: She is oriented to person, place, and time. She appears well-developed and well-nourished. No distress.  HENT:  Head: Normocephalic and atraumatic.  Neck: Normal range of motion. Neck supple.  Cardiovascular: Normal rate and regular rhythm.  Exam reveals no gallop and no friction rub.   No murmur heard. Pulmonary/Chest: Effort normal and breath sounds normal. No respiratory distress. She has no wheezes.  Abdominal: Soft. Bowel sounds are normal. She exhibits no distension. There is no tenderness.  Musculoskeletal: Normal range of motion.  Neurological: She is alert and oriented to person,  place, and time.  Skin: Skin is warm and dry. She is not diaphoretic.    ED Course  Procedures (including critical care time) Labs Review Labs Reviewed  BASIC METABOLIC PANEL - Abnormal; Notable for the following:    Glucose, Bld 108 (*)    GFR calc non Af Amer 51 (*)    GFR calc Af Amer 59 (*)    All other components within normal limits  CBC  PRO B NATRIURETIC PEPTIDE  TROPONIN I  I-STAT TROPOININ, ED    Imaging Review Dg Chest 2 View  11/22/2013   CLINICAL DATA:  Shortness of breath and chest pain.  EXAM: CHEST  2 VIEW  COMPARISON:  None available.  FINDINGS: The cardiac silhouette, mediastinal and hilar contours are within normal limits for age. There is tortuosity of the thoracic aorta. Mild chronic appearing bronchitic type interstitial lung changes and basilar scarring but no infiltrates, edema or effusions. The bony thorax is intact. Moderate degenerative changes involving the mid thoracic spine.  IMPRESSION: Chronic appearing lung changes without definite acute overlying pulmonary process.   Electronically Signed   By: Kalman Jewels M.D.   On: 11/22/2013 18:43     EKG Interpretation   Date/Time:  Friday November 22 2013 17:49:15 EDT Ventricular Rate:  66 PR Interval:  202 QRS Duration: 82 QT Interval:  376 QTC Calculation: 394 R Axis:   -10 Text Interpretation:  Sinus rhythm with first degree AV block Possible  Anterior infarct , age undetermined Abnormal ECG Confirmed by DELOS  MD,  Tashaya Ancrum ([id number]) on 11/22/2013 9:30:29 PM      MDM   Final diagnoses:  None    Patient presents with complaints of tightness across her shoulder blades and difficulty breathing that occurred earlier today. This occurred shortly after eating and was not related to exertion. Workup today reveals an unchanged EKG and negative troponin x2. While in the emergency department, her symptoms completely resolved and she now feels fine. We have discussed admission versus discharge and the  patient prefers to go home. She does understand to return if her symptoms substantially worsen or change.  Also considered is pulmonary embolism, however there is no tachycardia or hypoxia. There are no abnormalities on the chest x-ray to reveal other intrathoracic pathology.    Veryl Speak, MD 11/22/13 2233

## 2013-11-22 NOTE — ED Notes (Signed)
NAD on discharge. Pt discharged home with family

## 2014-11-12 ENCOUNTER — Ambulatory Visit
Admission: RE | Admit: 2014-11-12 | Discharge: 2014-11-12 | Disposition: A | Discharge status: Home / Self Care | Payer: Medicare Other | Source: Ambulatory Visit | Attending: Cardiovascular Disease | Admitting: Cardiovascular Disease

## 2014-11-12 ENCOUNTER — Other Ambulatory Visit: Payer: Self-pay | Admitting: Cardiovascular Disease

## 2014-11-12 DIAGNOSIS — R059 Cough, unspecified: Secondary | ICD-10-CM

## 2014-11-12 DIAGNOSIS — R0989 Other specified symptoms and signs involving the circulatory and respiratory systems: Secondary | ICD-10-CM

## 2014-11-12 DIAGNOSIS — R05 Cough: Secondary | ICD-10-CM

## 2014-11-12 IMAGING — CR DG CHEST 2V
2 series · 2 of 2 positions shown · non-contrast
Comparison: [DATE]

CLINICAL DATA: Four-day history of cough and congestion.

EXAM:
CHEST  2 VIEW

[w chest pa]
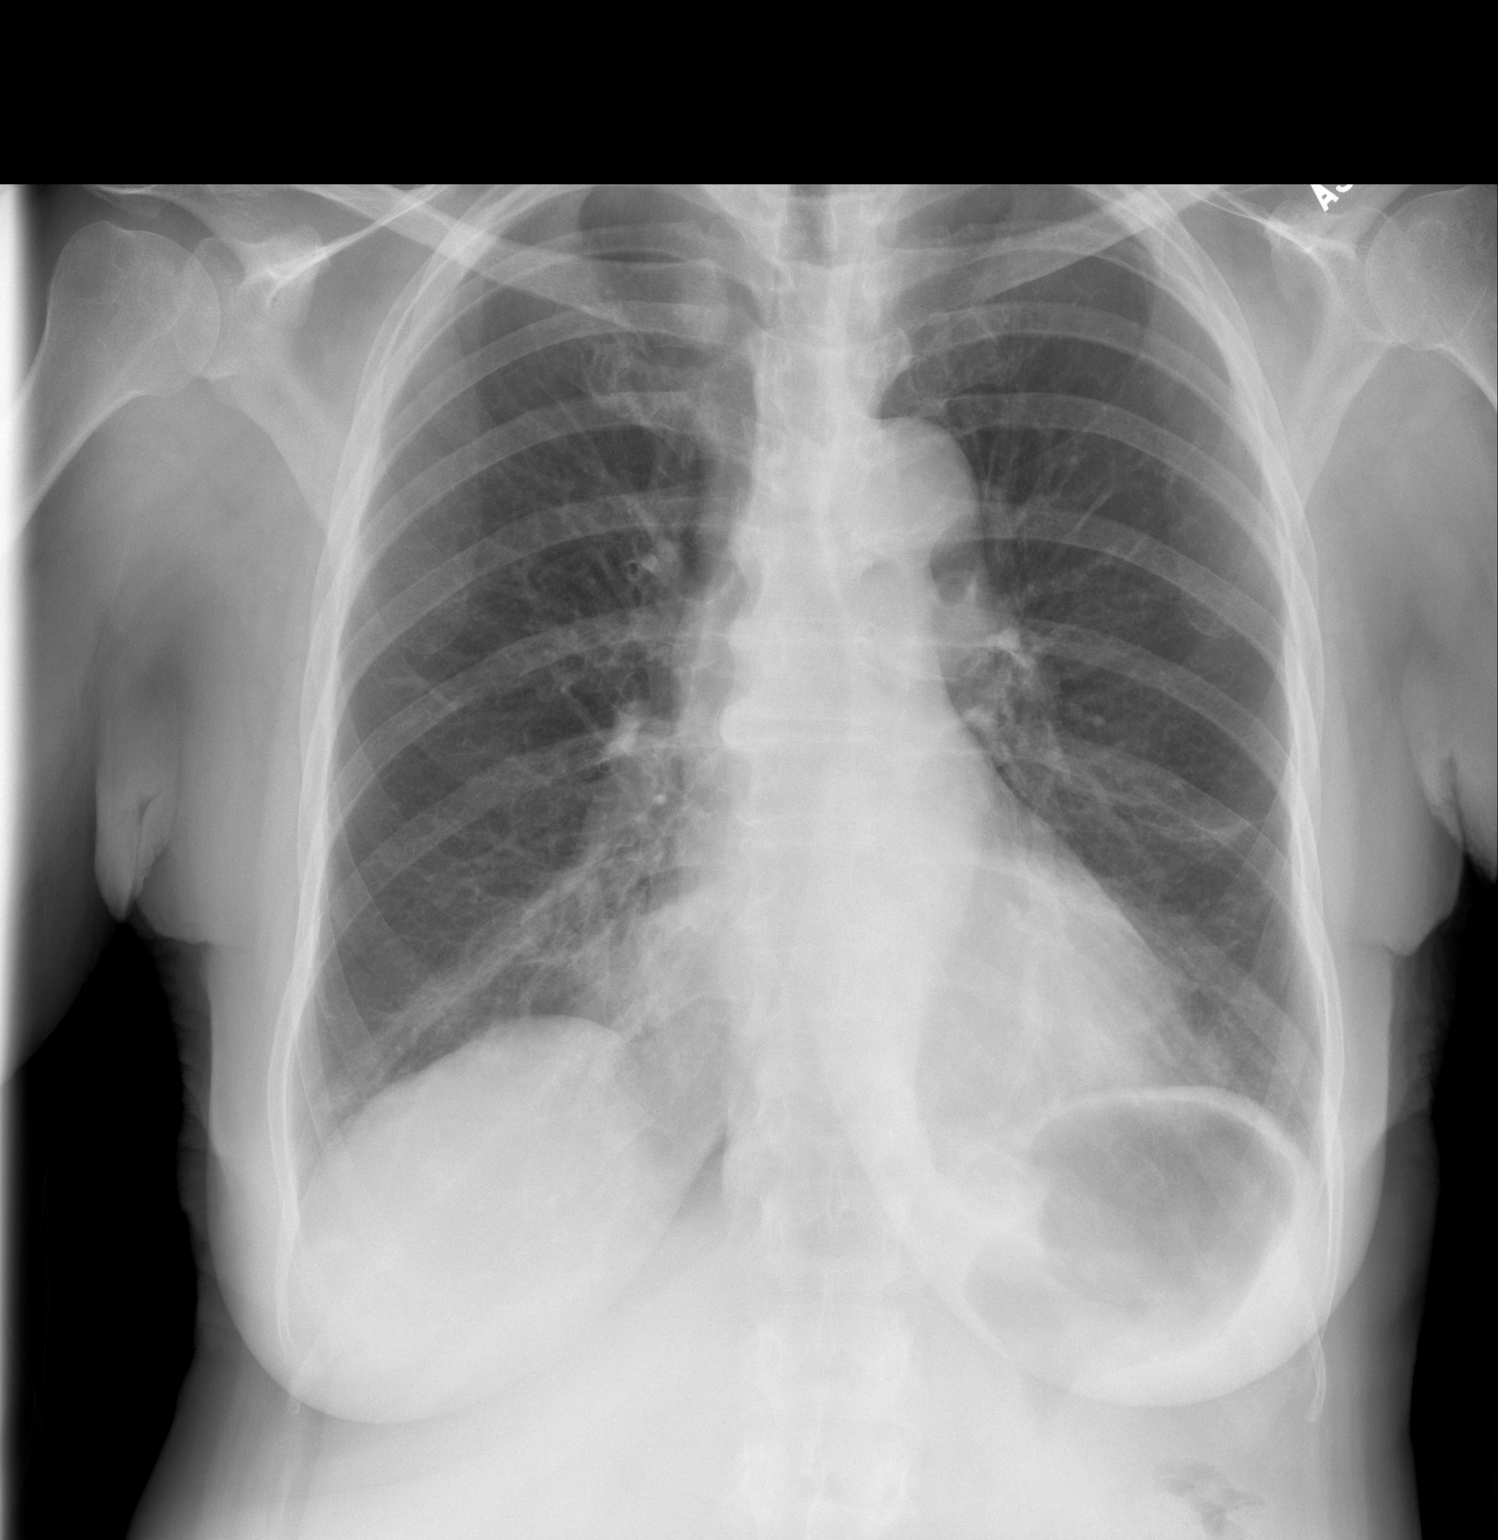

[w chest lat]
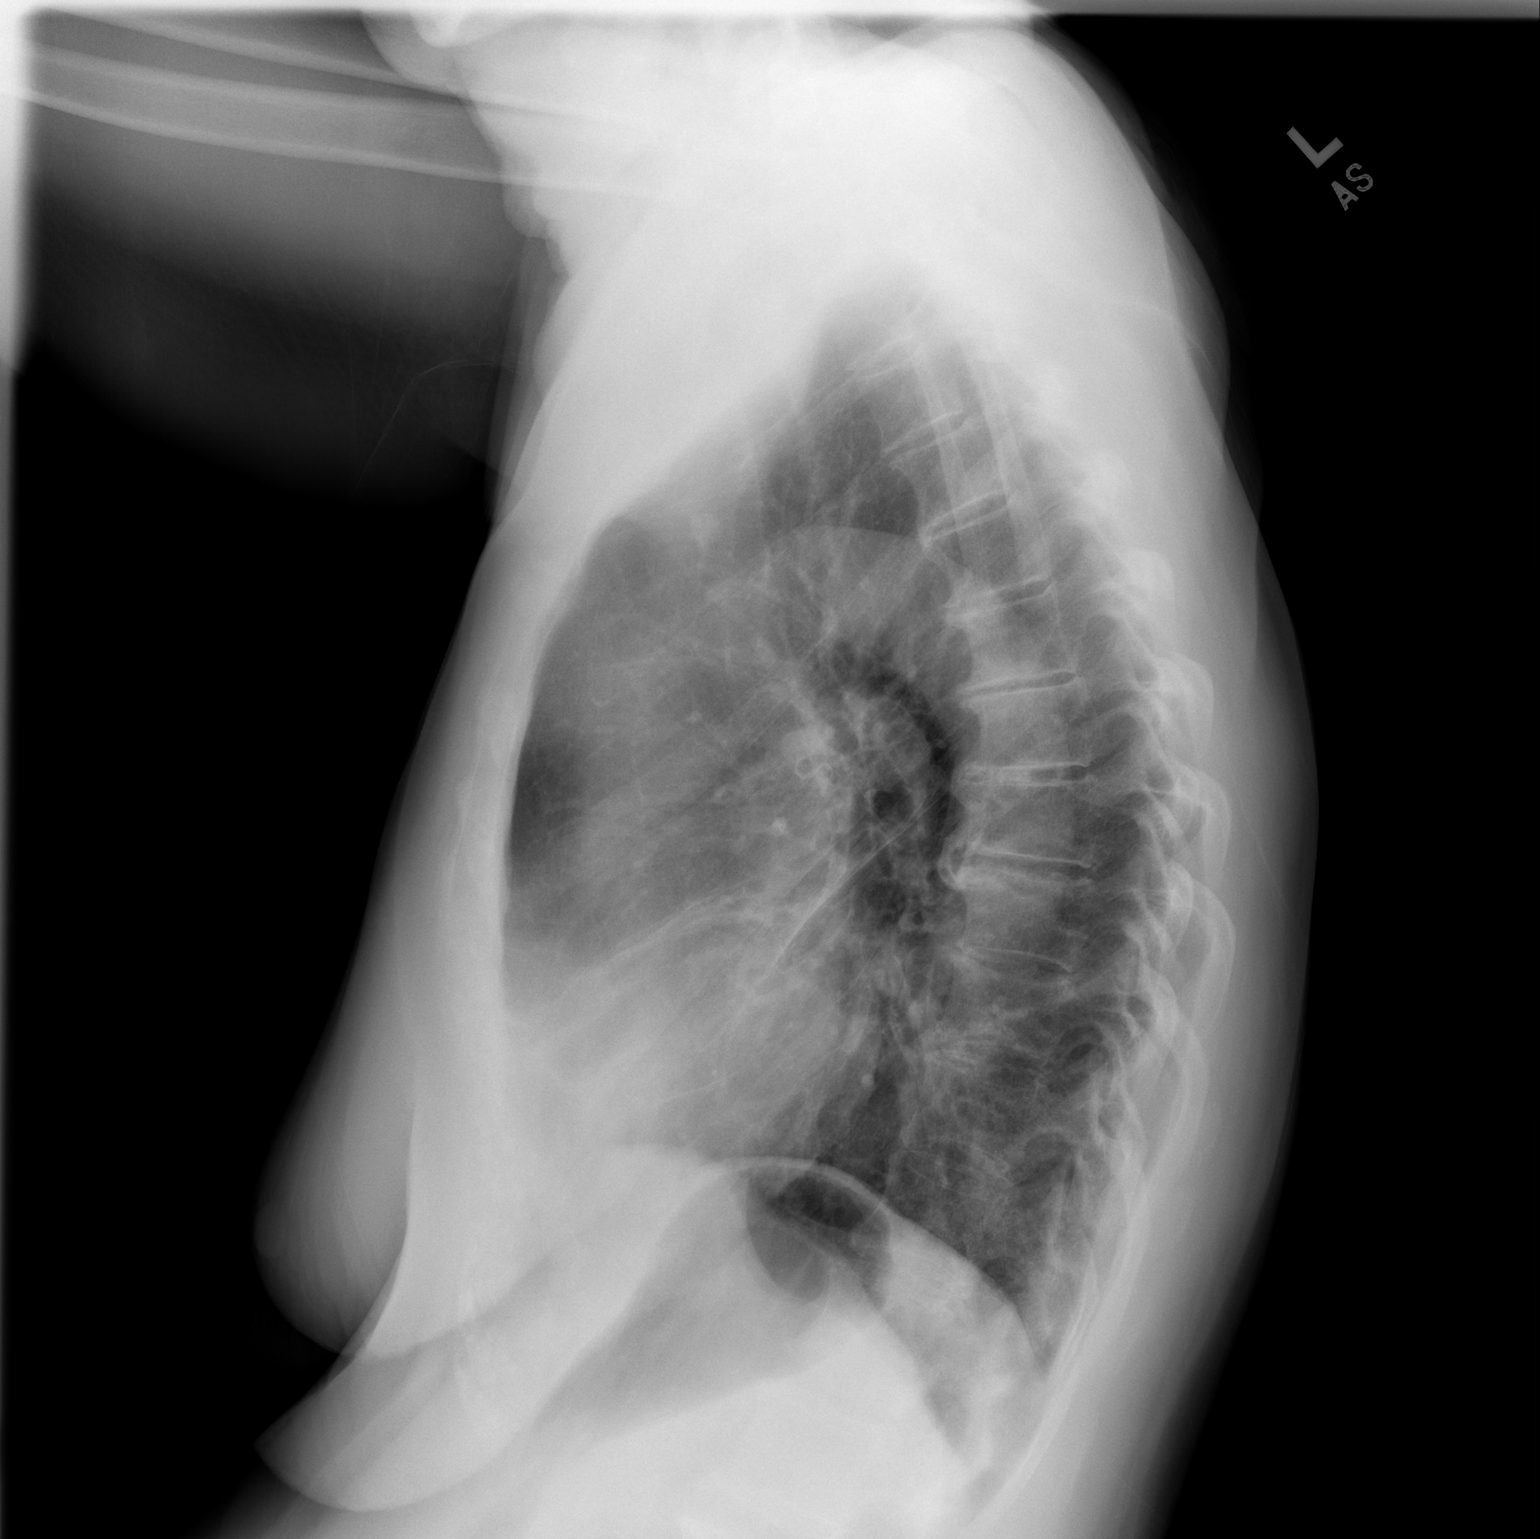

[2 of 2 positions shown; findings below may reference images not displayed]

FINDINGS: The heart is normal in size. The mediastinal and hilar contours are
stable. There is moderate tortuosity and ectasia of the thoracic
aorta. Chronic basilar scarring changes most notable in the right
middle lobe. No definite infiltrates or effusions. Minimal streaky
areas of subsegmental atelectasis. The bony thorax is intact. Stable
degenerative changes involving the thoracic spine.
IMPRESSION: Chronic basilar scarring changes and streaky subsegmental
atelectasis but no definite infiltrates or effusions.

## 2017-08-11 ENCOUNTER — Inpatient Hospital Stay (HOSPITAL_COMMUNITY): Payer: Medicare Other

## 2017-08-11 ENCOUNTER — Inpatient Hospital Stay (HOSPITAL_COMMUNITY)
Admission: AD | Admit: 2017-08-11 | Discharge: 2017-08-14 | DRG: 156 | Disposition: A | Discharge status: Home / Self Care | Payer: Medicare Other | Source: Ambulatory Visit | Attending: Cardiovascular Disease | Admitting: Cardiovascular Disease

## 2017-08-11 DIAGNOSIS — N182 Chronic kidney disease, stage 2 (mild): Secondary | ICD-10-CM | POA: Diagnosis present

## 2017-08-11 DIAGNOSIS — Y92009 Unspecified place in unspecified non-institutional (private) residence as the place of occurrence of the external cause: Secondary | ICD-10-CM | POA: Diagnosis not present

## 2017-08-11 DIAGNOSIS — R490 Dysphonia: Secondary | ICD-10-CM | POA: Diagnosis present

## 2017-08-11 DIAGNOSIS — R1310 Dysphagia, unspecified: Secondary | ICD-10-CM

## 2017-08-11 DIAGNOSIS — J384 Edema of larynx: Principal | ICD-10-CM | POA: Diagnosis present

## 2017-08-11 DIAGNOSIS — T464X5A Adverse effect of angiotensin-converting-enzyme inhibitors, initial encounter: Secondary | ICD-10-CM | POA: Diagnosis present

## 2017-08-11 DIAGNOSIS — Z7984 Long term (current) use of oral hypoglycemic drugs: Secondary | ICD-10-CM

## 2017-08-11 DIAGNOSIS — I129 Hypertensive chronic kidney disease with stage 1 through stage 4 chronic kidney disease, or unspecified chronic kidney disease: Secondary | ICD-10-CM | POA: Diagnosis present

## 2017-08-11 DIAGNOSIS — E1122 Type 2 diabetes mellitus with diabetic chronic kidney disease: Secondary | ICD-10-CM | POA: Diagnosis present

## 2017-08-11 DIAGNOSIS — I2510 Atherosclerotic heart disease of native coronary artery without angina pectoris: Secondary | ICD-10-CM | POA: Diagnosis present

## 2017-08-11 DIAGNOSIS — J069 Acute upper respiratory infection, unspecified: Secondary | ICD-10-CM | POA: Diagnosis present

## 2017-08-11 DIAGNOSIS — Z7982 Long term (current) use of aspirin: Secondary | ICD-10-CM

## 2017-08-11 DIAGNOSIS — K224 Dyskinesia of esophagus: Secondary | ICD-10-CM | POA: Diagnosis present

## 2017-08-11 LAB — HEMOGLOBIN A1C
Hgb A1c MFr Bld: 6.3 % — ABNORMAL HIGH (ref 4.8–5.6)
Mean Plasma Glucose: 134.11 mg/dL

## 2017-08-11 LAB — CBC WITH DIFFERENTIAL/PLATELET
Abs Immature Granulocytes: 0.1 10*3/uL (ref 0.0–0.1)
BASOS ABS: 0.1 10*3/uL (ref 0.0–0.1)
BASOS PCT: 0 %
EOS PCT: 1 %
Eosinophils Absolute: 0.1 10*3/uL (ref 0.0–0.7)
HCT: 41.1 % (ref 36.0–46.0)
Hemoglobin: 13.2 g/dL (ref 12.0–15.0)
Immature Granulocytes: 1 %
Lymphocytes Relative: 23 %
Lymphs Abs: 2.7 10*3/uL (ref 0.7–4.0)
MCH: 28.8 pg (ref 26.0–34.0)
MCHC: 32.1 g/dL (ref 30.0–36.0)
MCV: 89.5 fL (ref 78.0–100.0)
MONO ABS: 1.4 10*3/uL — AB (ref 0.1–1.0)
Monocytes Relative: 12 %
Neutro Abs: 7.2 10*3/uL (ref 1.7–7.7)
Neutrophils Relative %: 63 %
Platelets: 308 10*3/uL (ref 150–400)
RBC: 4.59 MIL/uL (ref 3.87–5.11)
RDW: 13.2 % (ref 11.5–15.5)
WBC: 11.5 10*3/uL — AB (ref 4.0–10.5)

## 2017-08-11 LAB — COMPREHENSIVE METABOLIC PANEL
ALBUMIN: 3.9 g/dL (ref 3.5–5.0)
ALK PHOS: 55 U/L (ref 38–126)
ALT: 10 U/L (ref 0–44)
ANION GAP: 9 (ref 5–15)
AST: 24 U/L (ref 15–41)
BILIRUBIN TOTAL: 1.4 mg/dL — AB (ref 0.3–1.2)
BUN: 16 mg/dL (ref 8–23)
CALCIUM: 9.6 mg/dL (ref 8.9–10.3)
CO2: 23 mmol/L (ref 22–32)
Chloride: 105 mmol/L (ref 98–111)
Creatinine, Ser: 1.34 mg/dL — ABNORMAL HIGH (ref 0.44–1.00)
GFR calc Af Amer: 40 mL/min — ABNORMAL LOW (ref >60)
GFR, EST NON AFRICAN AMERICAN: 35 mL/min — AB (ref >60)
GLUCOSE: 173 mg/dL — AB (ref 70–99)
Potassium: 4.7 mmol/L (ref 3.5–5.1)
Sodium: 137 mmol/L (ref 135–145)
TOTAL PROTEIN: 7.8 g/dL (ref 6.5–8.1)

## 2017-08-11 LAB — GLUCOSE, CAPILLARY
Glucose-Capillary: 143 mg/dL — ABNORMAL HIGH (ref 70–99)
Glucose-Capillary: 101 mg/dL — ABNORMAL HIGH (ref 70–99)

## 2017-08-11 IMAGING — CR DG CHEST 2V
2 series · 2 of 2 positions shown · non-contrast
Comparison: [DATE].

CLINICAL DATA: 89-year-old with dysphagia. Recent modified barium
swallow.

EXAM:
CHEST - 2 VIEW

[chest pa]
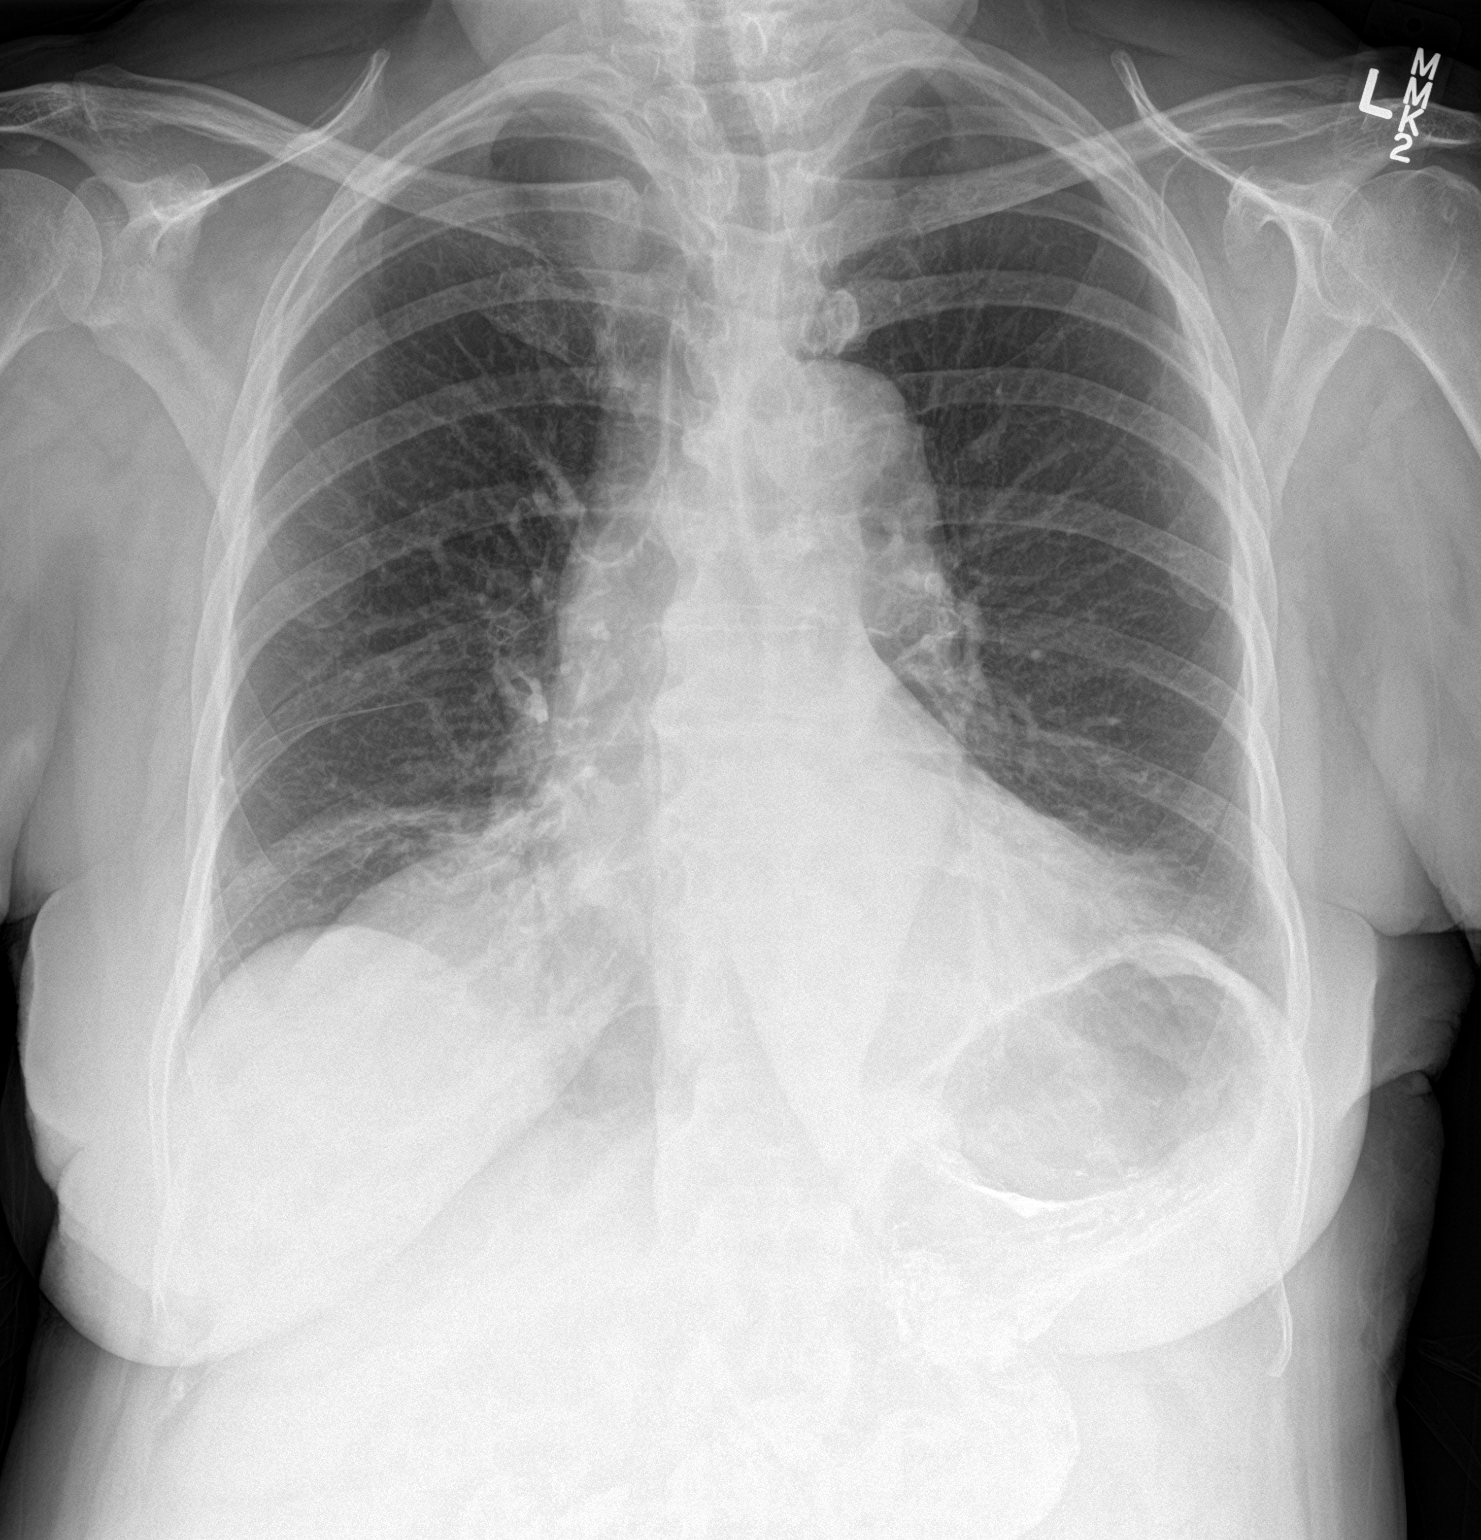

[chest lat]
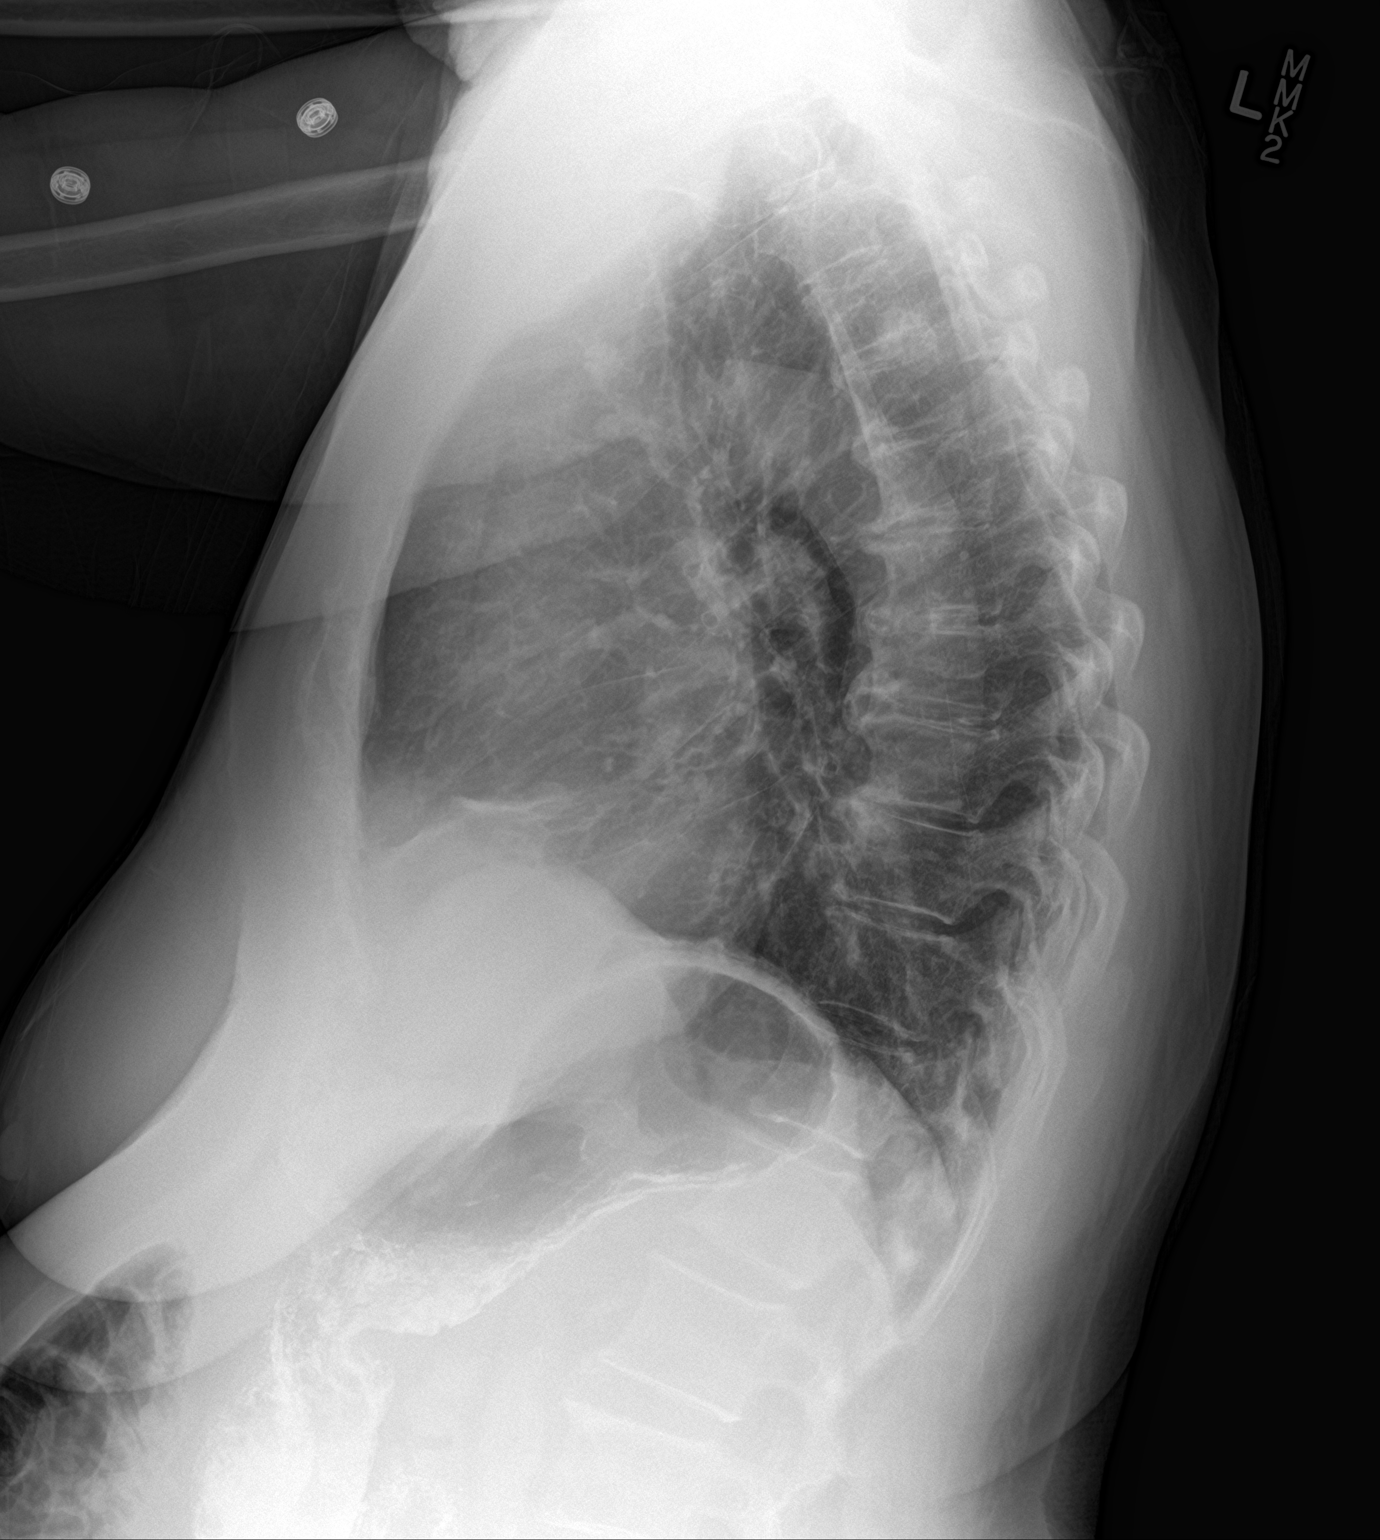

[2 of 2 positions shown; findings below may reference images not displayed]

FINDINGS: Barium in the stomach from recent swallowing examination. Patient
has eventration along the anterior right hemidiaphragm. There is
scarring or atelectasis along the anterior right lung base. Upper
lungs are clear. Heart and mediastinum are within normal limits and
stable. Trachea is midline. Multilevel degenerative disease in the
thoracic spine. No large pleural effusions.
IMPRESSION: No active cardiopulmonary disease.

Atelectasis or scarring at the right lung base.

## 2017-08-11 IMAGING — RF DG SWALLOWING FUNCTION - [facility]
13 of 18 series · 13 of 24 positions shown · non-contrast
Comparison: none

[Series 1: run · 1 of 21 frames shown (1 of 13)]
[frame 1/21]
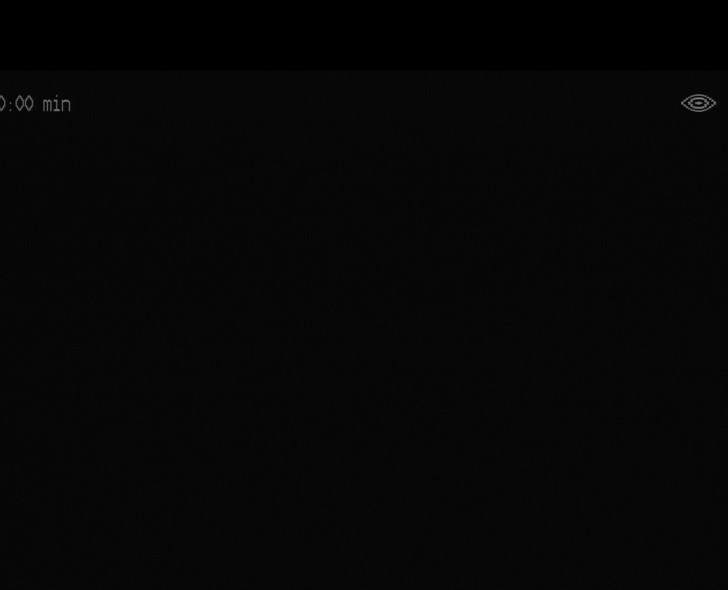

[Series 2: run · 1 of 21 frames shown (2 of 13)]
[frame 11/21]
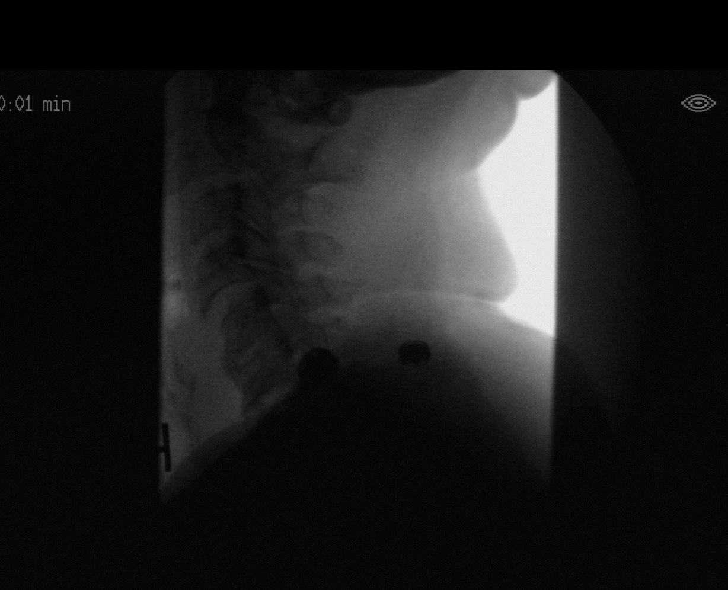

[Series 4: run · 1 of 212 frames shown (3 of 13)]
[frame 32/212]
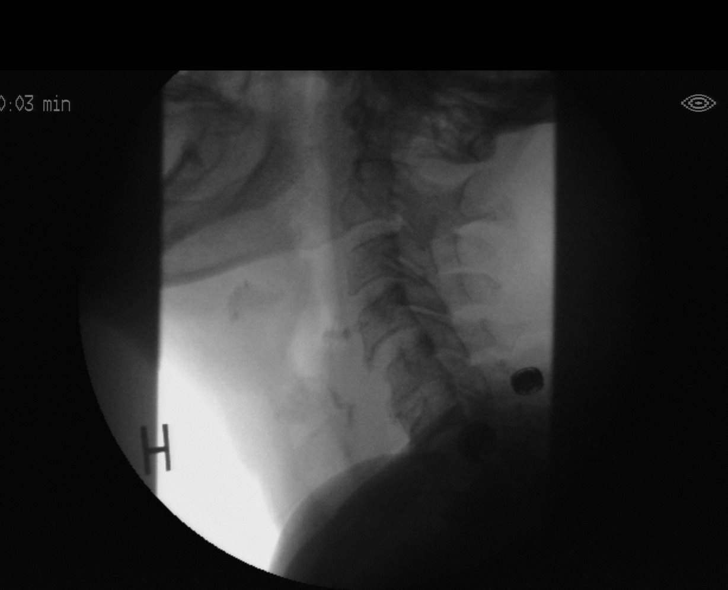

[Series 5: run · 1 of 95 frames shown (4 of 13)]
[frame 81/95]
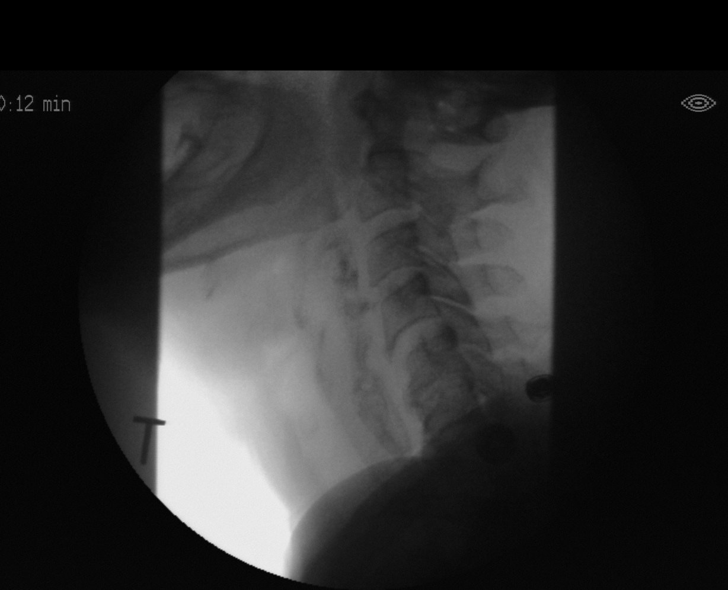

[Series 7: run · 1 of 202 frames shown (5 of 13)]
[frame 102/202]
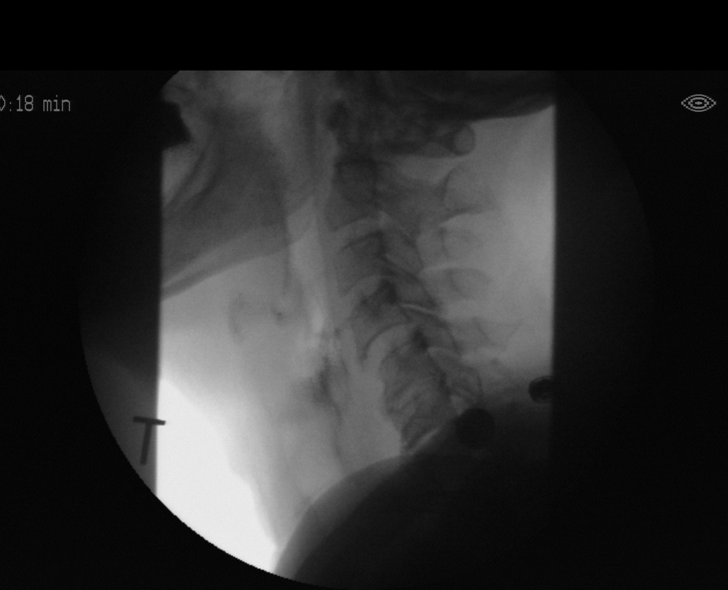

[Series 8: run · 1 of 196 frames shown (6 of 13)]
[frame 167/196]
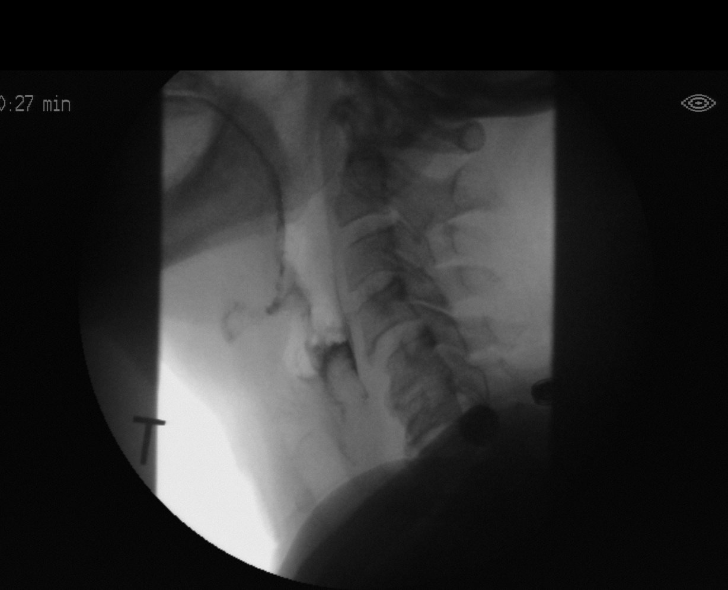

[Series 10: run · 1 of 105 frames shown (7 of 13)]
[frame 16/105]
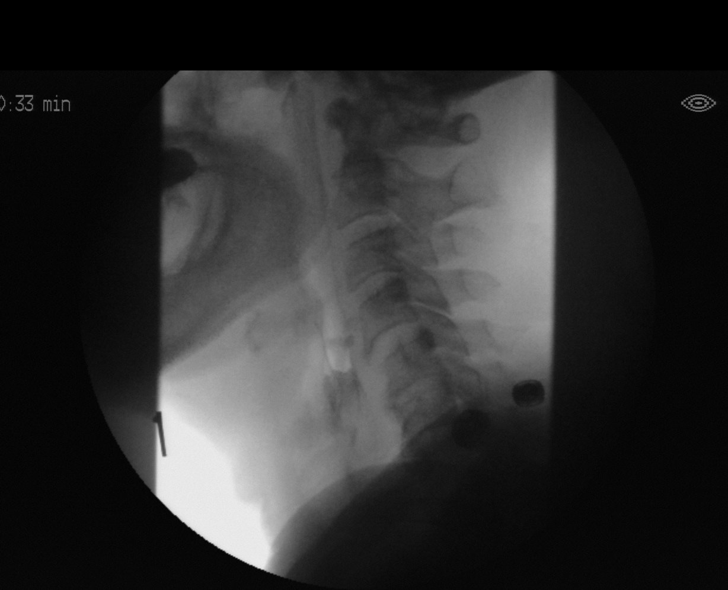

[Series 11: run · 1 of 220 frames shown (8 of 13)]
[frame 22/220]
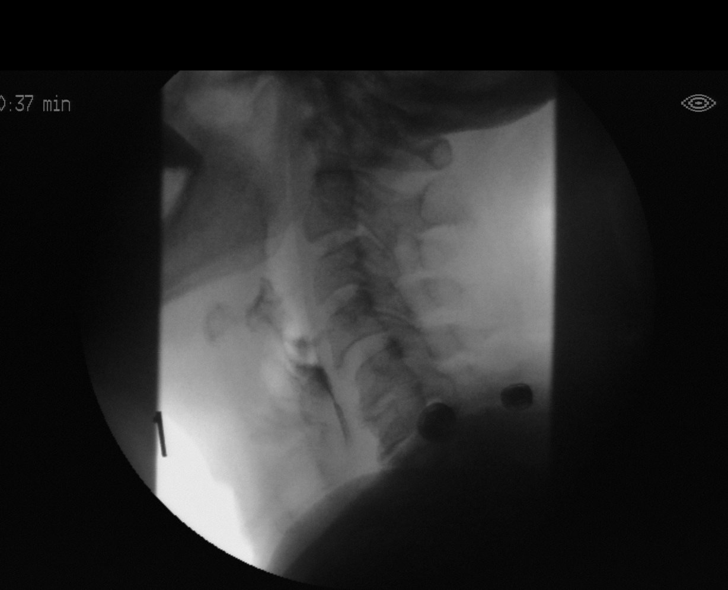

[Series 12: run · 1 of 28 frames shown (9 of 13)]
[frame 15/28]
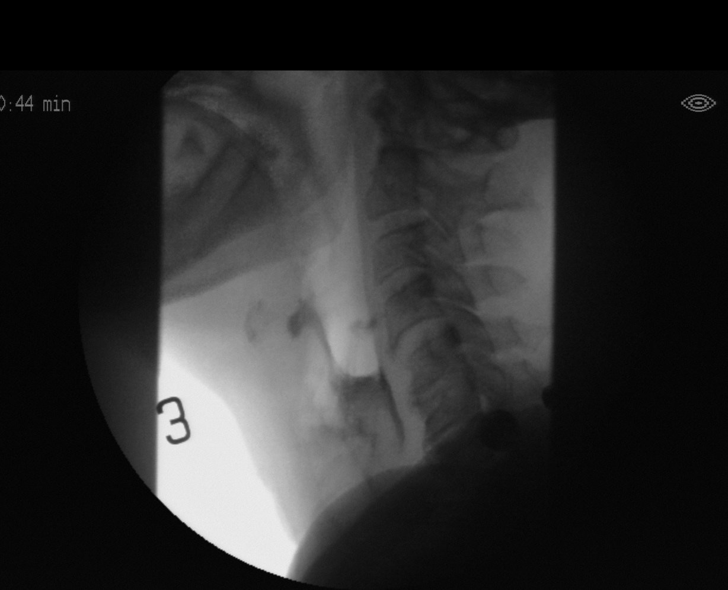

[Series 14: run · 1 of 194 frames shown (10 of 13)]
[frame 30/194]
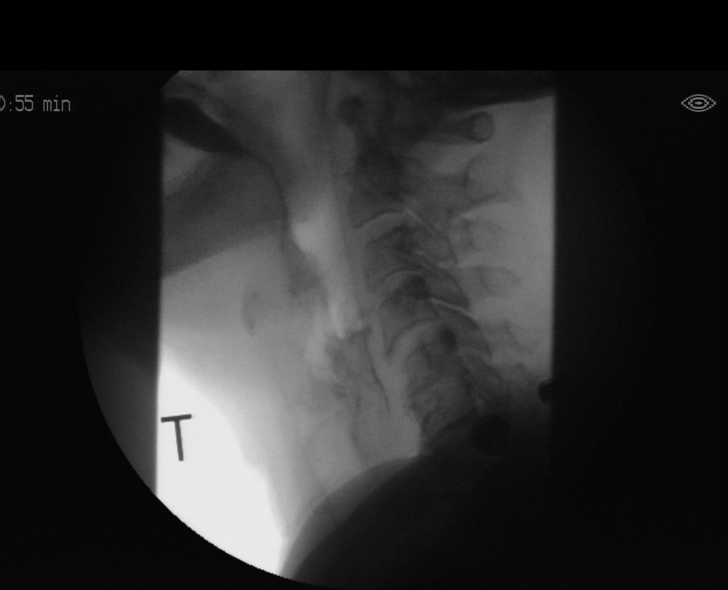

[Series 15: run · 1 of 76 frames shown (11 of 13)]
[frame 65/76]
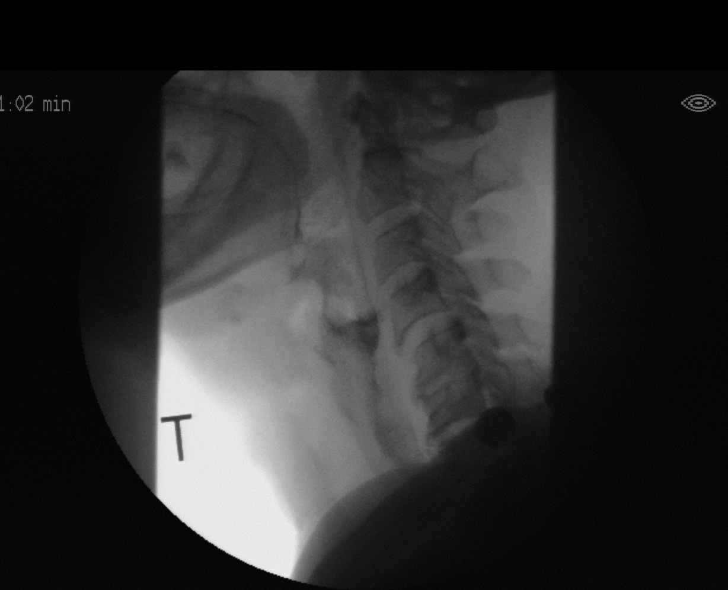

[Series 17: run · 1 of 191 frames shown (12 of 13)]
[frame 68/191]
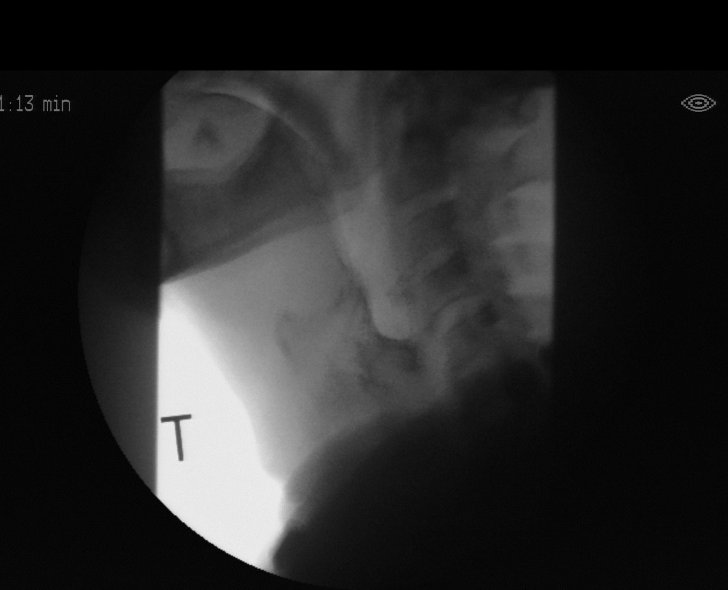

[Series 18: run · 1 of 316 frames shown (13 of 13)]
[frame 269/316]
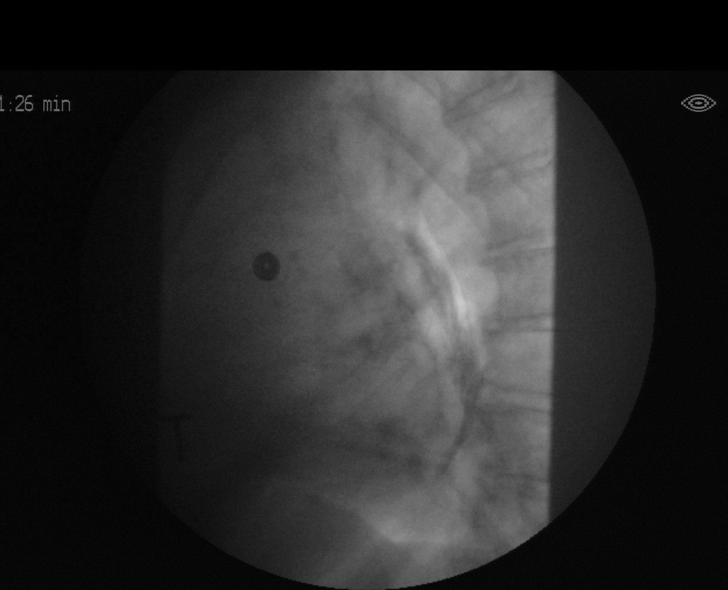

[13 of 24 positions shown; findings below may reference images not displayed]

FLUOROSCOPY FOR SWALLOWING FUNCTION STUDY:
Fluoroscopy was provided for swallowing function study, which was administered by a speech pathologist.  Final results and recommendations from this study are contained within the speech pathology report.

## 2017-08-11 MED ORDER — DILTIAZEM HCL ER 180 MG PO CP24: 180.0000 mg | ORAL_CAPSULE | Freq: Two times a day (BID) | ORAL | Status: DC

## 2017-08-11 MED ORDER — ISOSORBIDE MONONITRATE ER 30 MG PO TB24
30.0000 mg | ORAL_TABLET | Freq: Every day | ORAL | Status: DC
  Administered 2017-08-12 – 2017-08-14 (×3): 30 mg via ORAL
  Filled 2017-08-11 (×3): qty 1

## 2017-08-11 MED ORDER — METOPROLOL TARTRATE 50 MG PO TABS
50.0000 mg | ORAL_TABLET | Freq: Every day | ORAL | Status: DC
  Administered 2017-08-11 – 2017-08-13 (×3): 50 mg via ORAL
  Filled 2017-08-11 (×3): qty 1

## 2017-08-11 MED ORDER — INSULIN ASPART 100 UNIT/ML ~~LOC~~ SOLN
0.0000 [IU] | Freq: Three times a day (TID) | SUBCUTANEOUS | Status: DC
  Administered 2017-08-11 – 2017-08-12 (×2): 2 [IU] via SUBCUTANEOUS
  Administered 2017-08-12: 5 [IU] via SUBCUTANEOUS
  Administered 2017-08-13: 8 [IU] via SUBCUTANEOUS
  Administered 2017-08-13 – 2017-08-14 (×2): 5 [IU] via SUBCUTANEOUS
  Administered 2017-08-14 (×2): 3 [IU] via SUBCUTANEOUS

## 2017-08-11 MED ORDER — LISINOPRIL 5 MG PO TABS
5.0000 mg | ORAL_TABLET | Freq: Every day | ORAL | Status: DC
  Filled 2017-08-11: qty 1

## 2017-08-11 MED ORDER — ASPIRIN EC 81 MG PO TBEC
81.0000 mg | DELAYED_RELEASE_TABLET | Freq: Every day | ORAL | Status: DC
  Administered 2017-08-11 – 2017-08-13 (×3): 81 mg via ORAL
  Filled 2017-08-11 (×3): qty 1

## 2017-08-11 MED ORDER — METOPROLOL TARTRATE 25 MG PO TABS
25.0000 mg | ORAL_TABLET | Freq: Every day | ORAL | Status: DC
  Administered 2017-08-12 – 2017-08-14 (×3): 25 mg via ORAL
  Filled 2017-08-11 (×3): qty 1

## 2017-08-11 MED ORDER — ROSUVASTATIN CALCIUM 10 MG PO TABS
10.0000 mg | ORAL_TABLET | Freq: Every evening | ORAL | Status: DC
  Administered 2017-08-11 – 2017-08-14 (×4): 10 mg via ORAL
  Filled 2017-08-11 (×4): qty 1

## 2017-08-11 MED ORDER — DILTIAZEM HCL ER COATED BEADS 180 MG PO CP24
180.0000 mg | ORAL_CAPSULE | Freq: Two times a day (BID) | ORAL | Status: DC
  Administered 2017-08-11 – 2017-08-14 (×6): 180 mg via ORAL
  Filled 2017-08-11 (×6): qty 1

## 2017-08-11 MED ORDER — METOCLOPRAMIDE HCL 5 MG PO TABS
5.0000 mg | ORAL_TABLET | Freq: Three times a day (TID) | ORAL | Status: DC
  Administered 2017-08-11 – 2017-08-14 (×10): 5 mg via ORAL
  Filled 2017-08-11 (×10): qty 1

## 2017-08-11 MED ORDER — HEPARIN SODIUM (PORCINE) 5000 UNIT/ML IJ SOLN
5000.0000 [IU] | Freq: Three times a day (TID) | INTRAMUSCULAR | Status: DC
  Administered 2017-08-11 – 2017-08-14 (×8): 5000 [IU] via SUBCUTANEOUS
  Filled 2017-08-11 (×9): qty 1

## 2017-08-11 MED ORDER — GLIPIZIDE ER 5 MG PO TB24
5.0000 mg | ORAL_TABLET | Freq: Every day | ORAL | Status: DC
  Administered 2017-08-12 – 2017-08-14 (×3): 5 mg via ORAL
  Filled 2017-08-11 (×4): qty 1

## 2017-08-11 MED ORDER — DIGOXIN 125 MCG PO TABS
0.1250 mg | ORAL_TABLET | Freq: Every day | ORAL | Status: DC
  Administered 2017-08-12 – 2017-08-14 (×3): 0.125 mg via ORAL
  Filled 2017-08-11 (×3): qty 1

## 2017-08-11 MED ORDER — METFORMIN HCL 500 MG PO TABS
1000.0000 mg | ORAL_TABLET | Freq: Two times a day (BID) | ORAL | Status: DC
  Administered 2017-08-11 – 2017-08-14 (×7): 1000 mg via ORAL
  Filled 2017-08-11 (×7): qty 2

## 2017-08-11 NOTE — H&P (Signed)
Referring Physician:  Rachana Malesky is an 84 y.o. female.                       Chief Complaint: Dysphagia  HPI: 84 year old female has one day history of difficulty swallowing and voice change x 1 day. She denies speech problem or difficulty ambulating. PMH is positive for hypertension, CAD and type 2 DM. No fever or cough. No headache.  Past Medical History:  Diagnosis Date  . Coronary artery disease   . Diabetes mellitus without complication   . Hypertension       No past surgical history on file.  No family history on file. Social History:  reports that she has never smoked. She does not have any smokeless tobacco history on file. She reports that she does not drink alcohol. Her drug history is not on file.  Allergies: No Known Allergies  Medications Prior to Admission  Medication Sig Dispense Refill  . aspirin EC 81 MG tablet Take 81 mg by mouth at bedtime.    . Coenzyme Q10 100 MG TABS Take 100 mg by mouth daily.    . digoxin (LANOXIN) 0.25 MG tablet Take 0.25 mg by mouth daily.    Marland Kitchen diltiazem (DILACOR XR) 180 MG 24 hr capsule Take 180 mg by mouth 2 (two) times daily.    Marland Kitchen glipiZIDE (GLUCOTROL XL) 5 MG 24 hr tablet Take 5 mg by mouth daily.    . isosorbide mononitrate (IMDUR) 30 MG 24 hr tablet Take 30 mg by mouth daily.    Marland Kitchen lisinopril (PRINIVIL,ZESTRIL) 5 MG tablet Take 5 mg by mouth daily.    . metFORMIN (GLUCOPHAGE) 500 MG tablet Take 1,000 mg by mouth 2 (two) times daily with a meal.    . metoprolol (LOPRESSOR) 50 MG tablet Take 25-50 mg by mouth 2 (two) times daily. Takes 1/2 tablet (25 mg) in the morning and 1 tablet (50mg ) at night    . rosuvastatin (CRESTOR) 10 MG tablet Take 10 mg by mouth every evening.      No results found for this or any previous visit (from the past 48 hour(s)). No results found.  Review Of Systems Constitutional: No fever, chills, weight loss or gain. Eyes: No vision change, wears glasses. No discharge or pain. Ears: No hearing loss,  No tinnitus. Respiratory: No asthma, COPD, pneumonias. No shortness of breath. No hemoptysis. Cardiovascular: Occasional chest pain, palpitation, no leg edema. Gastrointestinal: No nausea, vomiting, diarrhea, constipation. No GI bleed. No hepatitis. Genitourinary: No dysuria, hematuria, kidney stone. No incontinance. Neurological: No headache, stroke, seizures.  Psychiatry: No psych facility admission for anxiety, depression, suicide. No detox. Skin: No rash. Musculoskeletal: Positive joint pain, no fibromyalgia. No neck pain, back pain. Lymphadenopathy: No lymphadenopathy. Hematology: No anemia or easy bruising.   Blood pressure 133/77, pulse 82, temperature 98.4 F (36.9 C), temperature source Oral, resp. rate 16, SpO2 99 %. There is no height or weight on file to calculate BMI. General appearance: alert, cooperative, appears stated age and no distress Head: Normocephalic, atraumatic. Eyes: Brown eyes, pink conjunctiva, corneas clear. PERRL, EOM's intact. Neck: No adenopathy, no carotid bruit, no JVD, supple, symmetrical, trachea midline and thyroid not enlarged. Resp: Clear to auscultation bilaterally. Cardio: Regular rate and rhythm, S1, S2 normal, II/VI systolic murmur, no click, rub or gallop GI: Soft, non-tender; bowel sounds normal; no organomegaly. Extremities: No edema, cyanosis or clubbing. Skin: Warm and dry.  Neurologic: Alert and oriented X 3, normal strength.  Normal coordination and gait.  Assessment/Plan Dysphagia HTN Type 2 DM CAD  Modified barium swallow. GI consult as needed. Consider MRI brain if needed.  Birdie Riddle, MD  08/11/2017, 12:42 PM

## 2017-08-11 NOTE — Progress Notes (Signed)
Modified Barium Swallow Progress Note  Patient Details  Name: Patricia English MRN: [id number] Date of Birth: 05-27-1927  Today's Date: 08/11/2017  Modified Barium Swallow completed.  Full report located under Chart Review in the Imaging Section.  Brief recommendations include the following:  Clinical Impression  Pt has a primary pharyngoesophageal dysphagia. She has brief holding of textures orally, which appears to be volitional and could be in response to current swallowing difficulties. Her pharyngeal swallow is functional with adequate hyolaryngeal movement, yet she is not able to efficiently clear boluses from her pharynx into her esophagus. She has reduced UES opening and prominent CP with residue increasing as textures become more solid. Multiple swallows and liquid washes help to reduce residue from solids, although this takes several subswallows per bolus, making it fairly effortful. Etiology is unclear as pt denies any h/o difficulty swallowing, describing an acute onset to her dysphagia. Recommend continuing with clear liquid diet for now, meds crushed in puree. SLP will continue to follow to assess tolerance and potential for advancement pending additional medical work up.    Swallow Evaluation Recommendations   Recommended Consults: Consider ENT evaluation;Consider GI evaluation   SLP Diet Recommendations: Thin liquid   Liquid Administration via: Cup;Straw   Medication Administration: Crushed with puree   Supervision: Patient able to self feed;Intermittent supervision to cue for compensatory strategies   Compensations: Slow rate;Small sips/bites;Multiple dry swallows after each bite/sip   Postural Changes: Seated upright at 90 degrees;Remain semi-upright after after feeds/meals (Comment)   Oral Care Recommendations: Oral care BID        Germain Osgood 08/11/2017,4:39 PM   Germain Osgood, M.A. CCC-SLP [phone]

## 2017-08-11 NOTE — Progress Notes (Signed)
1215- Received patient as a direct admit  To 6N. Dr. Doylene Canard notified and stated he would be over to see her. Patient alert, oriented x4. Patient very independent. Patient made comfortable in the bed. Will wait for orders.

## 2017-08-12 DIAGNOSIS — J384 Edema of larynx: Principal | ICD-10-CM

## 2017-08-12 DIAGNOSIS — R1310 Dysphagia, unspecified: Secondary | ICD-10-CM

## 2017-08-12 LAB — GLUCOSE, CAPILLARY
GLUCOSE-CAPILLARY: 137 mg/dL — AB (ref 70–99)
GLUCOSE-CAPILLARY: 236 mg/dL — AB (ref 70–99)
Glucose-Capillary: 117 mg/dL — ABNORMAL HIGH (ref 70–99)
Glucose-Capillary: 218 mg/dL — ABNORMAL HIGH (ref 70–99)

## 2017-08-12 MED ORDER — DIPHENHYDRAMINE HCL 25 MG PO CAPS
25.0000 mg | ORAL_CAPSULE | Freq: Three times a day (TID) | ORAL | Status: DC
  Administered 2017-08-12 – 2017-08-14 (×7): 25 mg via ORAL
  Filled 2017-08-12 (×7): qty 1

## 2017-08-12 MED ORDER — DEXAMETHASONE SODIUM PHOSPHATE 10 MG/ML IJ SOLN
10.0000 mg | INTRAMUSCULAR | Status: AC
  Administered 2017-08-12 – 2017-08-13 (×2): 10 mg via INTRAVENOUS
  Filled 2017-08-12 (×2): qty 1

## 2017-08-12 MED ORDER — FAMOTIDINE 20 MG PO TABS
20.0000 mg | ORAL_TABLET | Freq: Two times a day (BID) | ORAL | Status: DC
  Administered 2017-08-12 (×2): 20 mg via ORAL
  Filled 2017-08-12 (×2): qty 1

## 2017-08-12 NOTE — Consult Note (Signed)
Reason for Consult: Dysphagia Referring Physician: Dr. Troy English is an 84 y.o. female.  HPI: 84 year old female experienced acute onset of dysphagia and hoarseness two days ago while eating watermelon.  It may have been a mild choking event.  The symptoms were really not improved the next day so she saw Dr. Doylene English who admitted her to the hospital.  She underwent modified barium swallow demonstrating some pooling in the hypopharynx with slow transit.  Today, she says that the symptoms are a little better.  She has not had any pain.  Past Medical History:  Diagnosis Date  . Coronary artery disease   . Diabetes mellitus without complication   . Hypertension     No past surgical history on file.  No family history on file.  Social History:  reports that she has never smoked. She does not have any smokeless tobacco history on file. She reports that she does not drink alcohol. Her drug history is not on file.  Allergies: No Known Allergies  Medications: I have reviewed the patient's current medications.  Results for orders placed or performed during the hospital encounter of 08/11/17 (from the past 48 hour(s))  Comprehensive metabolic panel     Status: Abnormal   Collection Time: 08/11/17 12:48 PM  Result Value Ref Range   Sodium 137 135 - 145 mmol/L   Potassium 4.7 3.5 - 5.1 mmol/L    Comment: SLIGHT HEMOLYSIS   Chloride 105 98 - 111 mmol/L    Comment: Please note change in reference range.   CO2 23 22 - 32 mmol/L   Glucose, Bld 173 (H) 70 - 99 mg/dL    Comment: Please note change in reference range.   BUN 16 8 - 23 mg/dL    Comment: Please note change in reference range.   Creatinine, Ser 1.34 (H) 0.44 - 1.00 mg/dL   Calcium 9.6 8.9 - 10.3 mg/dL   Total Protein 7.8 6.5 - 8.1 g/dL   Albumin 3.9 3.5 - 5.0 g/dL   AST 24 15 - 41 U/L   ALT 10 0 - 44 U/L    Comment: Please note change in reference range.   Alkaline Phosphatase 55 38 - 126 U/L   Total Bilirubin 1.4  (H) 0.3 - 1.2 mg/dL   GFR calc non Af Amer 35 (L) >60 mL/min   GFR calc Af Amer 40 (L) >60 mL/min    Comment: (NOTE) The eGFR has been calculated using the CKD EPI equation. This calculation has not been validated in all clinical situations. eGFR's persistently <60 mL/min signify possible Chronic Kidney Disease.    Anion gap 9 5 - 15    Comment: Performed at [facility] [address] [place], [place] [address]  CBC WITH DIFFERENTIAL     Status: Abnormal   Collection Time: 08/11/17 12:48 PM  Result Value Ref Range   WBC 11.5 (H) 4.0 - 10.5 K/uL   RBC 4.59 3.87 - 5.11 MIL/uL   Hemoglobin 13.2 12.0 - 15.0 g/dL   HCT 41.1 36.0 - 46.0 %   MCV 89.5 78.0 - 100.0 fL   MCH 28.8 26.0 - 34.0 pg   MCHC 32.1 30.0 - 36.0 g/dL   RDW 13.2 11.5 - 15.5 %   Platelets 308 150 - 400 K/uL   Neutrophils Relative % 63 %   Neutro Abs 7.2 1.7 - 7.7 K/uL   Lymphocytes Relative 23 %   Lymphs Abs 2.7 0.7 - 4.0 K/uL  Monocytes Relative 12 %   Monocytes Absolute 1.4 (H) 0.1 - 1.0 K/uL   Eosinophils Relative 1 %   Eosinophils Absolute 0.1 0.0 - 0.7 K/uL   Basophils Relative 0 %   Basophils Absolute 0.1 0.0 - 0.1 K/uL   Immature Granulocytes 1 %   Abs Immature Granulocytes 0.1 0.0 - 0.1 K/uL    Comment: Performed at [facility] [address] [place], [place] [address]  Hemoglobin A1c     Status: Abnormal   Collection Time: 08/11/17 12:48 PM  Result Value Ref Range   Hgb A1c MFr Bld 6.3 (H) 4.8 - 5.6 %    Comment: (NOTE) Pre diabetes:          5.7%-6.4% Diabetes:              >6.4% Glycemic control for   <7.0% adults with diabetes    Mean Plasma Glucose 134.11 mg/dL    Comment: Performed at [facility] [address] [place], [place] [address]  Glucose, capillary     Status: Abnormal   Collection Time: 08/11/17  5:15 PM  Result Value Ref Range   Glucose-Capillary 143 (H) 70 - 99 mg/dL  Glucose, capillary     Status: Abnormal   Collection Time: 08/11/17  8:23 PM   Result Value Ref Range   Glucose-Capillary 101 (H) 70 - 99 mg/dL  Glucose, capillary     Status: Abnormal   Collection Time: 08/12/17  8:45 AM  Result Value Ref Range   Glucose-Capillary 137 (H) 70 - 99 mg/dL    X-ray Chest Pa And Lateral  Result Date: 08/11/2017 CLINICAL DATA:  84 year old with dysphagia. Recent modified barium swallow. EXAM: CHEST - 2 VIEW COMPARISON:  11/12/2014. FINDINGS: Barium in the stomach from recent swallowing examination. Patient has eventration along the anterior right hemidiaphragm. There is scarring or atelectasis along the anterior right lung base. Upper lungs are clear. Heart and mediastinum are within normal limits and stable. Trachea is midline. Multilevel degenerative disease in the thoracic spine. No large pleural effusions. IMPRESSION: No active cardiopulmonary disease. Atelectasis or scarring at the right lung base. Electronically Signed   By: Markus Daft M.D.   On: 08/11/2017 15:46    Review of Systems  All other systems reviewed and are negative.  Blood pressure (!) 143/69, pulse 83, temperature 98 F (36.7 C), temperature source Oral, resp. rate 17, height _0  (1.753 m), weight 184 lb 11.9 oz (83.8 kg), SpO2 97 %. Physical Exam  Constitutional: She is oriented to person, place, and time. She appears well-developed and well-nourished. No distress.  HENT:  Head: Normocephalic and atraumatic.  Right Ear: External ear normal.  Left Ear: External ear normal.  Nose: Nose normal.  Mouth/Throat: Oropharynx is clear and moist.  Mildly hoarse, gravely.  No stridor.  Eyes: Pupils are equal, round, and reactive to light. Conjunctivae and EOM are normal.  Neck: Normal range of motion. Neck supple.  Cardiovascular: Normal rate.  Respiratory: Effort normal.  Musculoskeletal: Normal range of motion.  Neurological: She is alert and oriented to person, place, and time. No cranial nerve deficit.  Skin: Skin is warm and dry.  Psychiatric: She has a normal  mood and affect. Her behavior is normal. Judgment and thought content normal.    Assessment/Plan: Dysphonia, dysphagia, laryngeal edema  Fiberoptic laryngoscopy was performed at the bedside.  See procedure note.  There is edema of the left posterior larynx that is not posing an airway risk  but certainly accounts for her symptoms.  In considering possible causes, angioedema related to her ACE inhibitor is certainly possible.  I discussed the findings with Dr. Doylene English.  We will stop her lisinopril and treat her with Decadron, Zantac, and Benadryl.  She can be discharged when symptoms improve.  Patricia English 08/12/2017, 10:05 AM

## 2017-08-12 NOTE — Procedures (Signed)
Preop diagnosis: Dysphonia, dysphagia Postop diagnosis: same, laryngeal edema Procedure: Transnasal fiberoptic laryngoscopy Surgeon: Redmond Baseman Anesth: Topical with 4% lidocaine Compl: None Findings: Edema of left arytenoid and false cord.  Airway widely patent.  Vocal folds mobile. Description:  After discussing risks, benefits, and alternatives, the patient was placed in a seated position and the right nasal passage was sprayed with topical anesthetic.  The fiberoptic scope was passed through the right nasal passage to view the pharynx and larynx.  Findings are noted above.  The scope was then removed and he was returned to nursing care in stable condition.

## 2017-08-12 NOTE — H&P (View-Only) (Signed)
Consultation  Referring Provider:   Dr. Doylene Canard   Primary Care Physician:  [name], MD Primary Gastroenterologist: Althia Forts    Reason for Consultation: Dysphagia            Impression / Plan:   Impression: 1.  Dysphagia: acute over the past 48 hours, swallow eval in process, pending results will likely need EGD for further eval 2. Hoarseness  Plan: 1. Pending swallow eval results, will likely need EGD 2. Continue supportive measures 3. Please await any final recs from Dr. Carlean Purl later today  Thank you for your kind consultation, we will continue to follow.  [name] [facility]  08/12/2017, 9:32 AM Pager #: [phone]  Agree with [name] and management.  [name], MD, [name]   Acute dysphagia w/ persistent sxs Cause not clear ? UES problem EGD and possible dilation tomorrow The risks and benefits as well as alternatives of endoscopic procedure(s) have been discussed and reviewed. All questions answered. The patient agrees to proceed.  [name], MD, [facility] 08/12/2017 12:19 PM    HPI:   Patricia English is a 84 y.o. female with past medical history as listed below, who presented to the hospital 08/11/2017 for dysphagia.   Today, explains that acutely on 08/10/17 she developed trouble swallowing. She had been eating a piece of watermelon and the phone rang and she abruptly stopped and answered the phone and she felt the watermelon get stuck. She stopped eating the watermelon. Later that night she tore some BBQ chicken of a chicken wing and was having trouble swallowing it even after chewing well it seemed to form a "lump" in the middle of her throat and then eventually would pass. This has continued with anything she has tried to eat. Liquids tend to "choke me".  No symptoms prior to this. NO heartburn or reflux. Associated symptoms include hoarseness, no odynophagia.   Denies fever, chills, weight loss, change in  BM or abdominal pain.     GI work-up so far: 08/11/2017 speech path eval: Patient unable to efficiently clear boluses from her pharynx into her esophagus, reduced UES opening and prominent CP with residual increasing as textures become more solid, etiology unclear  ENT laryngosopy eval L arytenoid and false cord edema o/w neg  Past Medical History:  Diagnosis Date  . Coronary artery disease   . Diabetes mellitus without complication   . Hypertension    Family history: No colon cancer  Social History   Tobacco Use  . Smoking status: Never Smoker  Substance Use Topics  . Alcohol use: No  . Drug use: Not on file    Prior to Admission medications   Medication Sig Start Date End Date Taking? Authorizing Provider  aspirin EC 81 MG tablet Take 81 mg by mouth at bedtime.   Yes [provider]  Coenzyme Q10 100 MG TABS Take 100 mg by mouth daily.   Yes [provider]  digoxin (LANOXIN) 0.125 MG tablet Take 125 mcg by mouth daily. 07/23/17  Yes [provider]  diltiazem (DILACOR XR) 180 MG 24 hr capsule Take 180 mg by mouth 2 (two) times daily.   Yes [provider]  glipiZIDE (GLUCOTROL XL) 5 MG 24 hr tablet Take 5 mg by mouth every evening.    Yes [provider]  isosorbide mononitrate (IMDUR) 30 MG 24 hr tablet Take 30 mg by mouth every evening.    Yes [provider]  lisinopril (  PRINIVIL,ZESTRIL) 5 MG tablet Take 5 mg by mouth every morning.    Yes [provider]  metFORMIN (GLUCOPHAGE) 500 MG tablet Take 1,000 mg by mouth 2 (two) times daily with a meal.   Yes [provider]  metoprolol (LOPRESSOR) 50 MG tablet Take 25-50 mg by mouth See admin instructions. Takes 1/2 tablet (25 mg) in the morning and 1 tablet (50mg ) at night    Yes [provider]  pravastatin (PRAVACHOL) 40 MG tablet Take 40 mg by mouth daily at 6 PM. 06/06/17  Yes [provider]    Current Facility-Administered Medications   Medication Dose Route Frequency Provider Last Rate Last Dose  . aspirin EC tablet 81 mg  81 mg Oral QHS [name], MD   81 mg at 08/11/17 2114  . digoxin (LANOXIN) tablet 0.125 mg  0.125 mg Oral Daily [name], MD   0.125 mg at 08/12/17 0921  . diltiazem (CARDIZEM CD) 24 hr capsule 180 mg  180 mg Oral BID [name], MD   180 mg at 08/11/17 2113  . glipiZIDE (GLUCOTROL XL) 24 hr tablet 5 mg  5 mg Oral Q breakfast [name], MD   5 mg at 08/12/17 0922  . heparin injection 5,000 Units  5,000 Units Subcutaneous Q8H [name], MD   5,000 Units at 08/12/17 0525  . insulin aspart (novoLOG) injection 0-15 Units  0-15 Units Subcutaneous TID WC [name], MD   2 Units at 08/12/17 0920  . isosorbide mononitrate (IMDUR) 24 hr tablet 30 mg  30 mg Oral Daily [name], MD      . lisinopril (PRINIVIL,ZESTRIL) tablet 5 mg  5 mg Oral Daily [name], MD      . metFORMIN (GLUCOPHAGE) tablet 1,000 mg  1,000 mg Oral BID WC [name], MD   1,000 mg at 08/12/17 0921  . metoCLOPramide (REGLAN) tablet 5 mg  5 mg Oral TID AC [name], MD   5 mg at 08/12/17 0849  . metoprolol tartrate (LOPRESSOR) tablet 25 mg  25 mg Oral Daily [name], MD      . metoprolol tartrate (LOPRESSOR) tablet 50 mg  50 mg Oral QHS [name], MD   50 mg at 08/11/17 2114  . rosuvastatin (CRESTOR) tablet 10 mg  10 mg Oral QPM [name], MD   10 mg at 08/11/17 1735    Allergies as of 08/11/2017  . (No Known Allergies)     Review of Systems:     Constitutional: No weight loss, fever or chills Skin: No rash  Cardiovascular: No chest pain Respiratory: No SOB  Gastrointestinal: See HPI and otherwise negative Genitourinary: No dysuria Neurological: No headache, dizziness or syncope Musculoskeletal: No new muscle or joint pain Hematologic: No bleeding Psychiatric: No history of depression or anxiety   Physical Exam:  Vital signs in last 24 hours: Temp:  [98 F (36.7 C)-98.4 F (36.9  C)] 98 F (36.7 C) (06/29 0410) Pulse Rate:  [82-83] 83 (06/29 0410) Resp:  [16-17] 17 (06/29 0410) BP: (133-165)/(69-77) 143/69 (06/29 0410) SpO2:  [97 %-99 %] 97 % (06/29 0410) Weight:  [184 lb 11.9 oz (83.8 kg)] 184 lb 11.9 oz (83.8 kg) (06/28 1215) Last BM Date: 08/11/17 General:   Pleasant AA female appears to be in NAD, Well developed, Well nourished, alert and cooperative Head:  Normocephalic and atraumatic. Eyes:   PEERL, EOMI. No icterus. Conjunctiva pink. Ears:  Normal auditory acuity. Neck:  Supple - no masses Throat: Oral  cavity and pharynx without inflammation, swelling or lesion. Lungs: Respirations even and unlabored. Lungs clear to auscultation bilaterally.   No wheezes, crackles, or rhonchi.  Heart: Normal S1, S2. No MRG. Regular rate and rhythm. No peripheral edema, cyanosis or pallor.  Abdomen:  Soft, nondistended, nontender. No rebound or guarding. Normal bowel sounds. No appreciable masses or hepatomegaly. Rectal:  Not performed.  Msk:  Symmetrical without gross deformities. Peripheral pulses intact.  Extremities:  Without edema, no deformity or joint abnormality.  Neurologic:  Alert and  oriented x4;  grossly normal neurologically.  Skin:   Dry and intact without significant lesions or rashes. Psychiatric: Demonstrates good judgement and reason without abnormal affect or behaviors.   LAB RESULTS: Recent Labs    08/11/17 1248  WBC 11.5*  HGB 13.2  HCT 41.1  PLT 308   BMET Recent Labs    08/11/17 1248  NA 137  K 4.7  CL 105  CO2 23  GLUCOSE 173*  BUN 16  CREATININE 1.34*  CALCIUM 9.6   LFT Recent Labs    08/11/17 1248  PROT 7.8  ALBUMIN 3.9  AST 24  ALT 10  ALKPHOS 55  BILITOT 1.4*   STUDIES: X-ray Chest Pa And Lateral  Result Date: 08/11/2017 CLINICAL DATA:  84 year old with dysphagia. Recent modified barium swallow. EXAM: CHEST - 2 VIEW COMPARISON:  11/12/2014. FINDINGS: Barium in the stomach from recent swallowing examination.  Patient has eventration along the anterior right hemidiaphragm. There is scarring or atelectasis along the anterior right lung base. Upper lungs are clear. Heart and mediastinum are within normal limits and stable. Trachea is midline. Multilevel degenerative disease in the thoracic spine. No large pleural effusions. IMPRESSION: No active cardiopulmonary disease. Atelectasis or scarring at the right lung base. Electronically Signed   By: Markus Daft M.D.   On: 08/11/2017 15:46

## 2017-08-12 NOTE — Consult Note (Addendum)
Consultation  Referring Provider:   Dr. Doylene Canard   Primary Care Physician:  [name], MD Primary Gastroenterologist: Althia Forts    Reason for Consultation: Dysphagia            Impression / Plan:   Impression: 1.  Dysphagia: acute over the past 48 hours, swallow eval in process, pending results will likely need EGD for further eval 2. Hoarseness  Plan: 1. Pending swallow eval results, will likely need EGD 2. Continue supportive measures 3. Please await any final recs from Dr. Carlean Purl later today  Thank you for your kind consultation, we will continue to follow.  [name] [facility]  08/12/2017, 9:32 AM Pager #: [phone]  Agree with [name] and management.  [name], MD, [name]   Acute dysphagia w/ persistent sxs Cause not clear ? UES problem EGD and possible dilation tomorrow The risks and benefits as well as alternatives of endoscopic procedure(s) have been discussed and reviewed. All questions answered. The patient agrees to proceed.  [name], MD, [facility] 08/12/2017 12:19 PM    HPI:   Patricia English is a 84 y.o. female with past medical history as listed below, who presented to the hospital 08/11/2017 for dysphagia.   Today, explains that acutely on 08/10/17 she developed trouble swallowing. She had been eating a piece of watermelon and the phone rang and she abruptly stopped and answered the phone and she felt the watermelon get stuck. She stopped eating the watermelon. Later that night she tore some BBQ chicken of a chicken wing and was having trouble swallowing it even after chewing well it seemed to form a "lump" in the middle of her throat and then eventually would pass. This has continued with anything she has tried to eat. Liquids tend to "choke me".  No symptoms prior to this. NO heartburn or reflux. Associated symptoms include hoarseness, no odynophagia.   Denies fever, chills, weight loss, change in  BM or abdominal pain.     GI work-up so far: 08/11/2017 speech path eval: Patient unable to efficiently clear boluses from her pharynx into her esophagus, reduced UES opening and prominent CP with residual increasing as textures become more solid, etiology unclear  ENT laryngosopy eval L arytenoid and false cord edema o/w neg  Past Medical History:  Diagnosis Date  . Coronary artery disease   . Diabetes mellitus without complication   . Hypertension    Family history: No colon cancer  Social History   Tobacco Use  . Smoking status: Never Smoker  Substance Use Topics  . Alcohol use: No  . Drug use: Not on file    Prior to Admission medications   Medication Sig Start Date End Date Taking? Authorizing Provider  aspirin EC 81 MG tablet Take 81 mg by mouth at bedtime.   Yes [provider]  Coenzyme Q10 100 MG TABS Take 100 mg by mouth daily.   Yes [provider]  digoxin (LANOXIN) 0.125 MG tablet Take 125 mcg by mouth daily. 07/23/17  Yes [provider]  diltiazem (DILACOR XR) 180 MG 24 hr capsule Take 180 mg by mouth 2 (two) times daily.   Yes [provider]  glipiZIDE (GLUCOTROL XL) 5 MG 24 hr tablet Take 5 mg by mouth every evening.    Yes [provider]  isosorbide mononitrate (IMDUR) 30 MG 24 hr tablet Take 30 mg by mouth every evening.    Yes [provider]  lisinopril (  PRINIVIL,ZESTRIL) 5 MG tablet Take 5 mg by mouth every morning.    Yes [provider]  metFORMIN (GLUCOPHAGE) 500 MG tablet Take 1,000 mg by mouth 2 (two) times daily with a meal.   Yes [provider]  metoprolol (LOPRESSOR) 50 MG tablet Take 25-50 mg by mouth See admin instructions. Takes 1/2 tablet (25 mg) in the morning and 1 tablet (50mg ) at night    Yes [provider]  pravastatin (PRAVACHOL) 40 MG tablet Take 40 mg by mouth daily at 6 PM. 06/06/17  Yes [provider]    Current Facility-Administered Medications   Medication Dose Route Frequency Provider Last Rate Last Dose  . aspirin EC tablet 81 mg  81 mg Oral QHS [name], MD   81 mg at 08/11/17 2114  . digoxin (LANOXIN) tablet 0.125 mg  0.125 mg Oral Daily [name], MD   0.125 mg at 08/12/17 0921  . diltiazem (CARDIZEM CD) 24 hr capsule 180 mg  180 mg Oral BID [name], MD   180 mg at 08/11/17 2113  . glipiZIDE (GLUCOTROL XL) 24 hr tablet 5 mg  5 mg Oral Q breakfast [name], MD   5 mg at 08/12/17 0922  . heparin injection 5,000 Units  5,000 Units Subcutaneous Q8H [name], MD   5,000 Units at 08/12/17 0525  . insulin aspart (novoLOG) injection 0-15 Units  0-15 Units Subcutaneous TID WC [name], MD   2 Units at 08/12/17 0920  . isosorbide mononitrate (IMDUR) 24 hr tablet 30 mg  30 mg Oral Daily [name], MD      . lisinopril (PRINIVIL,ZESTRIL) tablet 5 mg  5 mg Oral Daily [name], MD      . metFORMIN (GLUCOPHAGE) tablet 1,000 mg  1,000 mg Oral BID WC [name], MD   1,000 mg at 08/12/17 0921  . metoCLOPramide (REGLAN) tablet 5 mg  5 mg Oral TID AC [name], MD   5 mg at 08/12/17 0849  . metoprolol tartrate (LOPRESSOR) tablet 25 mg  25 mg Oral Daily [name], MD      . metoprolol tartrate (LOPRESSOR) tablet 50 mg  50 mg Oral QHS [name], MD   50 mg at 08/11/17 2114  . rosuvastatin (CRESTOR) tablet 10 mg  10 mg Oral QPM [name], MD   10 mg at 08/11/17 1735    Allergies as of 08/11/2017  . (No Known Allergies)     Review of Systems:     Constitutional: No weight loss, fever or chills Skin: No rash  Cardiovascular: No chest pain Respiratory: No SOB  Gastrointestinal: See HPI and otherwise negative Genitourinary: No dysuria Neurological: No headache, dizziness or syncope Musculoskeletal: No new muscle or joint pain Hematologic: No bleeding Psychiatric: No history of depression or anxiety   Physical Exam:  Vital signs in last 24 hours: Temp:  [98 F (36.7 C)-98.4 F (36.9  C)] 98 F (36.7 C) (06/29 0410) Pulse Rate:  [82-83] 83 (06/29 0410) Resp:  [16-17] 17 (06/29 0410) BP: (133-165)/(69-77) 143/69 (06/29 0410) SpO2:  [97 %-99 %] 97 % (06/29 0410) Weight:  [184 lb 11.9 oz (83.8 kg)] 184 lb 11.9 oz (83.8 kg) (06/28 1215) Last BM Date: 08/11/17 General:   Pleasant AA female appears to be in NAD, Well developed, Well nourished, alert and cooperative Head:  Normocephalic and atraumatic. Eyes:   PEERL, EOMI. No icterus. Conjunctiva pink. Ears:  Normal auditory acuity. Neck:  Supple - no masses Throat: Oral  cavity and pharynx without inflammation, swelling or lesion. Lungs: Respirations even and unlabored. Lungs clear to auscultation bilaterally.   No wheezes, crackles, or rhonchi.  Heart: Normal S1, S2. No MRG. Regular rate and rhythm. No peripheral edema, cyanosis or pallor.  Abdomen:  Soft, nondistended, nontender. No rebound or guarding. Normal bowel sounds. No appreciable masses or hepatomegaly. Rectal:  Not performed.  Msk:  Symmetrical without gross deformities. Peripheral pulses intact.  Extremities:  Without edema, no deformity or joint abnormality.  Neurologic:  Alert and  oriented x4;  grossly normal neurologically.  Skin:   Dry and intact without significant lesions or rashes. Psychiatric: Demonstrates good judgement and reason without abnormal affect or behaviors.   LAB RESULTS: Recent Labs    08/11/17 1248  WBC 11.5*  HGB 13.2  HCT 41.1  PLT 308   BMET Recent Labs    08/11/17 1248  NA 137  K 4.7  CL 105  CO2 23  GLUCOSE 173*  BUN 16  CREATININE 1.34*  CALCIUM 9.6   LFT Recent Labs    08/11/17 1248  PROT 7.8  ALBUMIN 3.9  AST 24  ALT 10  ALKPHOS 55  BILITOT 1.4*   STUDIES: X-ray Chest Pa And Lateral  Result Date: 08/11/2017 CLINICAL DATA:  84 year old with dysphagia. Recent modified barium swallow. EXAM: CHEST - 2 VIEW COMPARISON:  11/12/2014. FINDINGS: Barium in the stomach from recent swallowing examination.  Patient has eventration along the anterior right hemidiaphragm. There is scarring or atelectasis along the anterior right lung base. Upper lungs are clear. Heart and mediastinum are within normal limits and stable. Trachea is midline. Multilevel degenerative disease in the thoracic spine. No large pleural effusions. IMPRESSION: No active cardiopulmonary disease. Atelectasis or scarring at the right lung base. Electronically Signed   By: Markus Daft M.D.   On: 08/11/2017 15:46

## 2017-08-12 NOTE — Progress Notes (Signed)
Ref: [name], MD   Subjective:  Hoarse of voice continues. Able to swallow liquids but still has some difficulty and discomfort. Afebrile.  Objective:  Vital Signs in the last 24 hours: Temp:  [98 F (36.7 C)-98.4 F (36.9 C)] 98 F (36.7 C) (06/29 0410) Pulse Rate:  [82-83] 83 (06/29 0410) Resp:  [16-17] 17 (06/29 0410) BP: (133-165)/(69-77) 143/69 (06/29 0410) SpO2:  [97 %-99 %] 97 % (06/29 0410) Weight:  [83.8 kg (184 lb 11.9 oz)] 83.8 kg (184 lb 11.9 oz) (06/28 1215)  Physical Exam: BP Readings from Last 1 Encounters:  08/12/17 (!) 143/69     Wt Readings from Last 1 Encounters:  08/11/17 83.8 kg (184 lb 11.9 oz)    Weight change:  Body mass index is 27.28 kg/m. HEENT: [place]/AT, Eyes-Brown, PERL, EOMI, Conjunctiva-Pink, Sclera-Non-icteric Neck: No JVD, No bruit, Trachea midline. Lungs:  Clearing, Bilateral. Cardiac:  Regular rhythm, normal S1 and S2, no S3. II/VI systolic murmur. Abdomen:  Soft, non-tender. BS present. Extremities:  No edema present. No cyanosis. No clubbing. CNS: AxOx3, Cranial nerves grossly intact, moves all 4 extremities.  Skin: Warm and dry.   Intake/Output from previous day: No intake/output data recorded.    Lab Results: BMET    Component Value Date/Time   NA 137 08/11/2017 1248   NA 140 11/22/2013 1753   NA 140 01/04/2013 0618   K 4.7 08/11/2017 1248   K 3.8 11/22/2013 1753   K 4.3 01/04/2013 0618   CL 105 08/11/2017 1248   CL 101 11/22/2013 1753   CL 106 01/04/2013 0618   CO2 23 08/11/2017 1248   CO2 25 11/22/2013 1753   CO2 23 01/04/2013 0618   GLUCOSE 173 (H) 08/11/2017 1248   GLUCOSE 108 (H) 11/22/2013 1753   GLUCOSE 90 01/04/2013 0618   BUN 16 08/11/2017 1248   BUN 17 11/22/2013 1753   BUN 10 01/04/2013 0618   CREATININE 1.34 (H) 08/11/2017 1248   CREATININE 1.00 11/22/2013 1753   CREATININE 0.84 01/04/2013 0618   CALCIUM 9.6 08/11/2017 1248   CALCIUM 9.3 11/22/2013 1753   CALCIUM 8.6 01/04/2013 0618   GFRNONAA  35 (L) 08/11/2017 1248   GFRNONAA 51 (L) 11/22/2013 1753   GFRNONAA 63 (L) 01/04/2013 0618   GFRAA 40 (L) 08/11/2017 1248   GFRAA 59 (L) 11/22/2013 1753   GFRAA 73 (L) 01/04/2013 0618   CBC    Component Value Date/Time   WBC 11.5 (H) 08/11/2017 1248   RBC 4.59 08/11/2017 1248   HGB 13.2 08/11/2017 1248   HCT 41.1 08/11/2017 1248   PLT 308 08/11/2017 1248   MCV 89.5 08/11/2017 1248   MCH 28.8 08/11/2017 1248   MCHC 32.1 08/11/2017 1248   RDW 13.2 08/11/2017 1248   LYMPHSABS 2.7 08/11/2017 1248   MONOABS 1.4 (H) 08/11/2017 1248   EOSABS 0.1 08/11/2017 1248   BASOSABS 0.1 08/11/2017 1248   HEPATIC Function Panel Recent Labs    08/11/17 1248  PROT 7.8   HEMOGLOBIN A1C No components found for: HGA1C,  MPG CARDIAC ENZYMES Lab Results  Component Value Date   TROPONINI <0.30 11/22/2013   TROPONINI <0.30 10/13/2012   BNP No results for input(s): PROBNP in the last 8760 hours. TSH No results for input(s): TSH in the last 8760 hours. CHOLESTEROL No results for input(s): CHOL in the last 8760 hours.  Scheduled Meds: . aspirin EC  81 mg Oral QHS  . digoxin  0.125 mg Oral Daily  . diltiazem  180  mg Oral BID  . glipiZIDE  5 mg Oral Q breakfast  . heparin  5,000 Units Subcutaneous Q8H  . insulin aspart  0-15 Units Subcutaneous TID WC  . isosorbide mononitrate  30 mg Oral Daily  . lisinopril  5 mg Oral Daily  . metFORMIN  1,000 mg Oral BID WC  . metoCLOPramide  5 mg Oral TID AC  . metoprolol tartrate  25 mg Oral Daily  . metoprolol tartrate  50 mg Oral QHS  . rosuvastatin  10 mg Oral QPM   Continuous Infusions: PRN Meds:.  Assessment/Plan: Dysphagia, acute Hoarseness of voice, acute HTN Type 2 DM CAD  GI consult ENT consult. Reglan started to improve swallowing    LOS: 1 day    [name]  MD  08/12/2017, 9:26 AM

## 2017-08-13 ENCOUNTER — Encounter (HOSPITAL_COMMUNITY)
Admission: AD | Disposition: A | Discharge status: Home / Self Care | Payer: Self-pay | Source: Ambulatory Visit | Attending: Cardiovascular Disease

## 2017-08-13 ENCOUNTER — Encounter (HOSPITAL_COMMUNITY): Payer: Self-pay

## 2017-08-13 DIAGNOSIS — R490 Dysphonia: Secondary | ICD-10-CM

## 2017-08-13 HISTORY — PX: ESOPHAGOGASTRODUODENOSCOPY (EGD) WITH PROPOFOL: SHX5813

## 2017-08-13 LAB — GLUCOSE, CAPILLARY
GLUCOSE-CAPILLARY: 274 mg/dL — AB (ref 70–99)
GLUCOSE-CAPILLARY: 217 mg/dL — AB (ref 70–99)
GLUCOSE-CAPILLARY: 225 mg/dL — AB (ref 70–99)
Glucose-Capillary: 230 mg/dL — ABNORMAL HIGH (ref 70–99)

## 2017-08-13 SURGERY — ESOPHAGOGASTRODUODENOSCOPY (EGD) WITH PROPOFOL
Anesthesia: Moderate Sedation

## 2017-08-13 MED ORDER — PHENOL 1.4 % MT LIQD
1.0000 | OROMUCOSAL | Status: DC | PRN
  Administered 2017-08-13: 1 via OROMUCOSAL
  Filled 2017-08-13: qty 177

## 2017-08-13 MED ORDER — FENTANYL CITRATE (PF) 100 MCG/2ML IJ SOLN
INTRAMUSCULAR | Status: AC
  Filled 2017-08-13: qty 2

## 2017-08-13 MED ORDER — FENTANYL CITRATE (PF) 100 MCG/2ML IJ SOLN
INTRAMUSCULAR | Status: DC | PRN
  Administered 2017-08-13: 25 ug via INTRAVENOUS

## 2017-08-13 MED ORDER — MIDAZOLAM HCL 5 MG/ML IJ SOLN
INTRAMUSCULAR | Status: AC
  Filled 2017-08-13: qty 1

## 2017-08-13 MED ORDER — MIDAZOLAM HCL 5 MG/5ML IJ SOLN
INTRAMUSCULAR | Status: DC | PRN
  Administered 2017-08-13: 1 mg via INTRAVENOUS
  Administered 2017-08-13: 2 mg via INTRAVENOUS

## 2017-08-13 MED ORDER — SODIUM CHLORIDE 0.9 % IV SOLN
INTRAVENOUS | Status: DC
  Administered 2017-08-13: 500 mL via INTRAVENOUS

## 2017-08-13 MED ORDER — BUTAMBEN-TETRACAINE-BENZOCAINE 2-2-14 % EX AERO
INHALATION_SPRAY | CUTANEOUS | Status: DC | PRN
  Administered 2017-08-13: 1 via TOPICAL

## 2017-08-13 SURGICAL SUPPLY — 15 items

## 2017-08-13 NOTE — Progress Notes (Signed)
Ref: [name], MD   Subjective:  Finished EGD. No GI lesion. Laryngeal edema seen again. Patient reports further small improvement in swallowing. Off Lisinopril and on prednisone per ENT. Improving hoarse of voice.  Objective:  Vital Signs in the last 24 hours: Temp:  [97.7 F (36.5 C)-98.7 F (37.1 C)] 97.7 F (36.5 C) (06/30 0810) Pulse Rate:  [79-112] 95 (06/30 0827) Resp:  [16-25] 20 (06/30 0827) BP: (119-195)/(58-101) 139/71 (06/30 0827) SpO2:  [96 %-100 %] 99 % (06/30 0827) Weight:  [84.5 kg (186 lb 4.6 oz)] 84.5 kg (186 lb 4.6 oz) (06/30 0636)  Physical Exam: BP Readings from Last 1 Encounters:  08/13/17 139/71     Wt Readings from Last 1 Encounters:  08/13/17 84.5 kg (186 lb 4.6 oz)    Weight change: 0.7 kg (1 lb 8.7 oz) Body mass index is 27.51 kg/m. HEENT: [place]/AT, Eyes-Brown, Conjunctiva-Pink, Sclera-Non-icteric Neck: No JVD, No bruit, Trachea midline. Lungs:  Clear, Bilateral. Cardiac:  Regular rhythm, normal S1 and S2, no S3. II/VI systolic murmur. Abdomen:  Soft, non-tender. BS present. Extremities:  No edema present. No cyanosis. No clubbing. CNS: AxOx3, Cranial nerves grossly intact, moves all 4 extremities.  Skin: Warm and dry.   Intake/Output from previous day: 06/29 0701 - 06/30 0700 In: 120 [P.O.:120] Out: -     Lab Results: BMET    Component Value Date/Time   NA 137 08/11/2017 1248   NA 140 11/22/2013 1753   NA 140 01/04/2013 0618   K 4.7 08/11/2017 1248   K 3.8 11/22/2013 1753   K 4.3 01/04/2013 0618   CL 105 08/11/2017 1248   CL 101 11/22/2013 1753   CL 106 01/04/2013 0618   CO2 23 08/11/2017 1248   CO2 25 11/22/2013 1753   CO2 23 01/04/2013 0618   GLUCOSE 173 (H) 08/11/2017 1248   GLUCOSE 108 (H) 11/22/2013 1753   GLUCOSE 90 01/04/2013 0618   BUN 16 08/11/2017 1248   BUN 17 11/22/2013 1753   BUN 10 01/04/2013 0618   CREATININE 1.34 (H) 08/11/2017 1248   CREATININE 1.00 11/22/2013 1753   CREATININE 0.84 01/04/2013 0618    CALCIUM 9.6 08/11/2017 1248   CALCIUM 9.3 11/22/2013 1753   CALCIUM 8.6 01/04/2013 0618   GFRNONAA 35 (L) 08/11/2017 1248   GFRNONAA 51 (L) 11/22/2013 1753   GFRNONAA 63 (L) 01/04/2013 0618   GFRAA 40 (L) 08/11/2017 1248   GFRAA 59 (L) 11/22/2013 1753   GFRAA 73 (L) 01/04/2013 0618   CBC    Component Value Date/Time   WBC 11.5 (H) 08/11/2017 1248   RBC 4.59 08/11/2017 1248   HGB 13.2 08/11/2017 1248   HCT 41.1 08/11/2017 1248   PLT 308 08/11/2017 1248   MCV 89.5 08/11/2017 1248   MCH 28.8 08/11/2017 1248   MCHC 32.1 08/11/2017 1248   RDW 13.2 08/11/2017 1248   LYMPHSABS 2.7 08/11/2017 1248   MONOABS 1.4 (H) 08/11/2017 1248   EOSABS 0.1 08/11/2017 1248   BASOSABS 0.1 08/11/2017 1248   HEPATIC Function Panel Recent Labs    08/11/17 1248  PROT 7.8   HEMOGLOBIN A1C No components found for: HGA1C,  MPG CARDIAC ENZYMES Lab Results  Component Value Date   TROPONINI <0.30 11/22/2013   TROPONINI <0.30 10/13/2012   BNP No results for input(s): PROBNP in the last 8760 hours. TSH No results for input(s): TSH in the last 8760 hours. CHOLESTEROL No results for input(s): CHOL in the last 8760 hours.  Scheduled Meds: .  aspirin EC  81 mg Oral QHS  . digoxin  0.125 mg Oral Daily  . diltiazem  180 mg Oral BID  . diphenhydrAMINE  25 mg Oral Q8H  . glipiZIDE  5 mg Oral Q breakfast  . heparin  5,000 Units Subcutaneous Q8H  . insulin aspart  0-15 Units Subcutaneous TID WC  . isosorbide mononitrate  30 mg Oral Daily  . metFORMIN  1,000 mg Oral BID WC  . metoCLOPramide  5 mg Oral TID AC  . metoprolol tartrate  25 mg Oral Daily  . metoprolol tartrate  50 mg Oral QHS  . rosuvastatin  10 mg Oral QPM   Continuous Infusions: PRN Meds:.phenol  Assessment/Plan: Dysphagia, acute Hoarseness of voice, acute Acute laryngeal edema Possible lisinopril allergy HTN Type 2 DM CAD  Appreciate GI and ENT consult Increase diet and activity.    LOS: 2 days    [name]  MD   08/13/2017, 10:13 AM

## 2017-08-13 NOTE — Interval H&P Note (Signed)
History and Physical Interval Note:  08/13/2017 7:54 AM  [name]  has presented today for surgery, with the diagnosis of Dysphagia  The various methods of treatment have been discussed with the patient and family. After consideration of risks, benefits and other options for treatment, the patient has consented to  Procedure(s): ESOPHAGOGASTRODUODENOSCOPY (EGD) WITH PROPOFOL (N/A) as a surgical intervention .  The patient's history has been reviewed, patient examined, no change in status, stable for surgery.  I have reviewed the patient's chart and labs.  Questions were answered to the patient's satisfaction.     [name]

## 2017-08-13 NOTE — Op Note (Signed)
[facility] Patient Name: Patricia English Procedure Date : 08/13/2017 MRN: [id number] Attending MD: [name] , MD Date of Birth: [birth year] CSN: [id number] Age: 84 Admit Type: Inpatient Procedure:                Upper GI endoscopy Indications:              Dysphagia Providers:                [name], MD, Carolynn Comment RN, RN,                            Charolette Child, Technician Referring MD:              Medicines:                Midazolam 3 mg IV, Fentanyl 25 micrograms IV,                            Cetacaine spray Complications:            No immediate complications. Estimated Blood Loss:     Estimated blood loss: none. Procedure:                Pre-Anesthesia Assessment:                           - Prior to the procedure, a History and Physical                            was performed, and patient medications and                            allergies were reviewed. The patient's tolerance of                            previous anesthesia was also reviewed. The risks                            and benefits of the procedure and the sedation                            options and risks were discussed with the patient.                            All questions were answered, and informed consent                            was obtained. Prior Anticoagulants: The patient has                            taken no previous anticoagulant or antiplatelet                            agents. ASA Grade Assessment: III - A patient with  severe systemic disease. After reviewing the risks                            and benefits, the patient was deemed in                            satisfactory condition to undergo the procedure.                           After obtaining informed consent, the endoscope was                            passed under direct vision. Throughout the                            procedure, the patient's blood pressure,  pulse, and                            oxygen saturations were monitored continuously. The                            EG-[id number] ([id number]) scope was introduced through the                            mouth, and advanced to the second part of duodenum.                            The upper GI endoscopy was accomplished without                            difficulty. The patient tolerated the procedure                            well. Scope In: Scope Out: Findings:      Laryngeal edema was visualized, most prominently on the left arytenoid.       The edema is not obstructing the airway.      Laryngeal edema was visualized, most prominently on the left false vocal       cord. The edema is not obstructing the airway.      The exam was otherwise without abnormality.      The cardia and gastric fundus were normal on retroflexion. Impression:               - Laryngeal edema was found.                           - Laryngeal edema was found.                           - The examination was otherwise normal.                           - No specimens collected. Moderate Sedation:      Moderate (conscious) sedation was administered by the endoscopy nurse       and supervised by the  endoscopist. The following parameters were       monitored: oxygen saturation, heart rate, blood pressure, respiratory       rate, EKG, adequacy of pulmonary ventilation, and response to care.       Total physician intraservice time was 9 minutes. Recommendation:           - Return patient to hospital ward for ongoing care.                           - Advance diet as tolerated.                           - Continue present medications.                           - I think she can go home today                           Edema of left larynx seems to be the problem and                            should resolve with time.                           If sxs do do not would f/u ENT                           Could consider short steroid  taper                           I stoped Pepcid - this is not a GERD issue                           F/U GI prn otherwise                           I suppose when she choked on the watermelon she                            somehow traumatized this area Procedure Code(s):        --- Professional ---                           [phone], Esophagogastroduodenoscopy, flexible,                            transoral; diagnostic, including collection of                            specimen(s) by brushing or washing, when performed                            (separate procedure) Diagnosis Code(s):        --- Professional ---  J38.4, Edema of larynx                           R13.10, Dysphagia, unspecified CPT copyright 2017 American Medical Association. All rights reserved. The codes documented in this report are preliminary and upon coder review may  be revised to meet current compliance requirements. [name], MD 08/13/2017 8:23:46 AM This report has been signed electronically. Number of Addenda: 0

## 2017-08-14 ENCOUNTER — Encounter (HOSPITAL_COMMUNITY): Payer: Self-pay | Admitting: Internal Medicine

## 2017-08-14 LAB — BASIC METABOLIC PANEL
Anion gap: 13 (ref 5–15)
BUN: 36 mg/dL — AB (ref 8–23)
CHLORIDE: 102 mmol/L (ref 98–111)
CO2: 21 mmol/L — AB (ref 22–32)
CREATININE: 1.44 mg/dL — AB (ref 0.44–1.00)
Calcium: 9.3 mg/dL (ref 8.9–10.3)
GFR calc non Af Amer: 32 mL/min — ABNORMAL LOW (ref >60)
GFR, EST AFRICAN AMERICAN: 37 mL/min — AB (ref >60)
Glucose, Bld: 189 mg/dL — ABNORMAL HIGH (ref 70–99)
POTASSIUM: 4.8 mmol/L (ref 3.5–5.1)
Sodium: 136 mmol/L (ref 135–145)

## 2017-08-14 LAB — GLUCOSE, CAPILLARY
GLUCOSE-CAPILLARY: 198 mg/dL — AB (ref 70–99)
Glucose-Capillary: 201 mg/dL — ABNORMAL HIGH (ref 70–99)
Glucose-Capillary: 173 mg/dL — ABNORMAL HIGH (ref 70–99)

## 2017-08-14 MED ORDER — AMOXICILLIN 250 MG PO CHEW
500.0000 mg | CHEWABLE_TABLET | Freq: Three times a day (TID) | ORAL | Status: DC
  Filled 2017-08-14 (×2): qty 2

## 2017-08-14 MED ORDER — AMOXICILLIN 500 MG PO CAPS: 500.0000 mg | ORAL_CAPSULE | Freq: Three times a day (TID) | ORAL | 0 refills | Status: DC

## 2017-08-14 MED ORDER — DIPHENHYDRAMINE HCL 25 MG PO CAPS
25.0000 mg | ORAL_CAPSULE | Freq: Every day | ORAL | 0 refills | Status: DC
Start: 2017-08-14 — End: 2017-12-08

## 2017-08-14 MED ORDER — SODIUM CHLORIDE 0.9 % IV SOLN
INTRAVENOUS | Status: AC
  Administered 2017-08-14: 09:00:00 via INTRAVENOUS

## 2017-08-14 MED ORDER — METOCLOPRAMIDE HCL 5 MG PO TABS: 5.0000 mg | ORAL_TABLET | Freq: Three times a day (TID) | ORAL | 1 refills | Status: DC

## 2017-08-14 MED ORDER — GUAIFENESIN-DM 100-10 MG/5ML PO SYRP
5.0000 mL | ORAL_SOLUTION | ORAL | Status: DC
  Administered 2017-08-14 (×3): 5 mL via ORAL
  Filled 2017-08-14 (×3): qty 5

## 2017-08-14 MED ORDER — AMOXICILLIN 500 MG PO CAPS
500.0000 mg | ORAL_CAPSULE | Freq: Three times a day (TID) | ORAL | Status: DC
  Administered 2017-08-14 (×2): 500 mg via ORAL
  Filled 2017-08-14 (×4): qty 1

## 2017-08-14 MED ORDER — DIGOXIN 250 MCG PO TABS: 0.1250 mg | ORAL_TABLET | Freq: Every day | ORAL | Status: DC

## 2017-08-14 MED ORDER — GUAIFENESIN-DM 100-10 MG/5ML PO SYRP: 5.0000 mL | ORAL_SOLUTION | ORAL | 0 refills | Status: DC

## 2017-08-14 MED ORDER — GLUCERNA SHAKE PO LIQD: 237.0000 mL | Freq: Three times a day (TID) | ORAL | Status: DC

## 2017-08-14 MED ORDER — GLUCERNA SHAKE PO LIQD: 237.0000 mL | Freq: Three times a day (TID) | ORAL | 1 refills | Status: DC

## 2017-08-14 MED ORDER — PHENOL 1.4 % MT LIQD: 1.0000 | OROMUCOSAL | 0 refills | Status: DC | PRN

## 2017-08-14 NOTE — Progress Notes (Signed)
Inpatient Diabetes Program Recommendations  AACE/ADA: New Consensus Statement on Inpatient Glycemic Control (2015)  Target Ranges:  Prepandial:   less than 140 mg/dL      Peak postprandial:   less than 180 mg/dL (1-2 hours)      Critically ill patients:  140 - 180 mg/dL    Review of Glycemic Control  Diabetes history: DM 2 Outpatient Diabetes medications: Metformin 1000 mg BID, Glipizide 5 mg Qsupper Current orders for Inpatient glycemic control: Metformin 1000 mg BID, Glipizide 5 mg Daily, Novolog 0-15 units tid  Inpatient Diabetes Program Recommendations:    Decadron 10 mg given yesterday morning. Glucose trends are elevated in the 200's. Fasting glucose 201, trending downward. Will watch trends today, which should trend down.  Thanks,  Tama Headings RN, MSN, BC-ADM, [facility] Inpatient Diabetes Coordinator Team Pager [phone] (8a-5p)

## 2017-08-14 NOTE — Progress Notes (Signed)
Discharge instructions reviewed with Pt and her family member.  Prescription given to daughter.  Pt has her f/u appointment request for Cardiology to call tomorrow.  Pt has her prescriptions to pick up at her pharmacy. Pt left via w/c to front to meet her daughter. Pt discharged home.

## 2017-08-14 NOTE — Plan of Care (Signed)
  Problem: Education: Goal: Knowledge of General Education information will improve Outcome: Progressing   Problem: Clinical Measurements: Goal: Respiratory complications will improve Outcome: Progressing Goal: Cardiovascular complication will be avoided Outcome: Progressing   Problem: Coping: Goal: Level of anxiety will decrease Outcome: Progressing

## 2017-08-14 NOTE — Discharge Summary (Signed)
Physician Discharge Summary  Patient ID: Patricia English MRN: [id number] DOB/AGE: 82-Apr-1929 82 y.o.  Admit date: 08/11/2017 Discharge date: 08/14/2017  Admission Diagnoses: Acute dysphagia Acute hoarseness of voice Hypertension Type 2 DM CAD  Discharge Diagnoses:  Principal Problem: * Acute dysphagia * Active problem:  Acute laryngeal edema Acute hoarseness of voice from above Possible lisinopril allergy Possible acute upper respiratory tract infection Hypertension Type 2 DM CAD Possible esophageal dysmotility, acute CKD, 2 from DM and HTN  Discharged Condition: fair  Hospital Course: 84 year old female had one day history of difficulty swallowing and voice change. She denied vision change, dysarthria and difficulty ambulating. She had ENT consult of Dr. Redmond Baseman and GI consult of Dr. Carlean Purl.  The work up showed laryngeal edema on visualization by a scope by both consultants. Lisinopril adverse effect was suspected by Dr. Redmond Baseman and patient had some relief with high dose decadron, Pepcid and benadryl use.  There was possible upper esophageal dysmotility on modified barium swallow. Small dose od Reglan was started. PO antibiotic and cough medications were started for development of acute cough and ongoing hoarseness of voice and she had additional small relief. Patient was advised to use liquid food supplements like Glucerna and soft diet till laryngeal edema is improved.  She was discharged home in stable condition and will see me in 1 week.  Consults: cardiology, GI and ENT  Significant Diagnostic Studies: labs: Near normal CBC,  BMET and Hgb A1C, except creatinine of 1.34 mg/dL and sugar of 173 mg/dLand endoscopy: Upper endoscopy and laryngoscopy  Treatments: antibiotics: PO Amoxicillin and Robitussin DM.  PO Reglan, benadryl, Pepcid and Decadron  Discharge Exam: Blood pressure 122/68, pulse 78, temperature 99.4 F (37.4 C), temperature source Oral, resp. rate 17, height 5'  9" (1.753 m), weight 84.5 kg (186 lb 4.6 oz), SpO2 97 %. General appearance: alert, cooperative and appears stated age. Head: Normocephalic, atraumatic. Throat: Mild erythema and hoarseness of voice. Eyes: Brown eyes, pink conjunctiva, corneas clear. PERRL, EOM's intact.  Neck: No adenopathy, no carotid bruit, no JVD, supple, symmetrical, trachea midline and thyroid not enlarged. Resp: Clear to auscultation bilaterally. Cardio: Regular rate and rhythm, S1, S2 normal, II/VI systolic murmur, no click, rub or gallop. GI: Soft, non-tender; bowel sounds normal; no organomegaly. Extremities: No edema, cyanosis or clubbing. Skin: Warm and dry.  Neurologic: Alert and oriented X 3, normal strength and tone. Normal coordination and slow gait.  Disposition: Discharge disposition: 01-Home or Self Care        Allergies as of 08/14/2017   No Known Allergies     Medication List    STOP taking these medications   lisinopril 5 MG tablet Commonly known as:  PRINIVIL,ZESTRIL     TAKE these medications   amoxicillin 500 MG capsule Commonly known as:  AMOXIL Take 1 capsule (500 mg total) by mouth every 8 (eight) hours.   aspirin EC 81 MG tablet Take 81 mg by mouth at bedtime.   Coenzyme Q10 100 MG Tabs Take 100 mg by mouth daily.   digoxin 0.25 MG tablet Commonly known as:  LANOXIN Take 0.5 tablets (0.125 mg total) by mouth daily. What changed:    how much to take  Another medication with the same name was removed. Continue taking this medication, and follow the directions you see here.   diltiazem 180 MG 24 hr capsule Commonly known as:  DILACOR XR Take 180 mg by mouth 2 (two) times daily.   diphenhydrAMINE 25 mg capsule Commonly  known as:  BENADRYL Take 1 capsule (25 mg total) by mouth at bedtime.   feeding supplement (GLUCERNA SHAKE) Liqd Take 237 mLs by mouth 3 (three) times daily with meals. Start taking on:  08/15/2017   glipiZIDE 5 MG 24 hr tablet Commonly known as:   GLUCOTROL XL Take 5 mg by mouth every evening.   guaiFENesin-dextromethorphan 100-10 MG/5ML syrup Commonly known as:  ROBITUSSIN DM Take 5 mLs by mouth every 4 (four) hours while awake.   isosorbide mononitrate 30 MG 24 hr tablet Commonly known as:  IMDUR Take 30 mg by mouth every evening.   metFORMIN 500 MG tablet Commonly known as:  GLUCOPHAGE Take 1,000 mg by mouth 2 (two) times daily with a meal.   metoCLOPramide 5 MG tablet Commonly known as:  REGLAN Take 1 tablet (5 mg total) by mouth 3 (three) times daily before meals. Start taking on:  08/15/2017   metoprolol tartrate 50 MG tablet Commonly known as:  LOPRESSOR Take 25-50 mg by mouth See admin instructions. Takes 1/2 tablet (25 mg) in the morning and 1 tablet (50mg ) at night   phenol 1.4 % Liqd Commonly known as:  CHLORASEPTIC Use as directed 1 spray in the mouth or throat as needed for throat irritation / pain.   pravastatin 40 MG tablet Commonly known as:  PRAVACHOL Take 40 mg by mouth daily at 6 PM.      Follow-up Information    [name], MD. Schedule an appointment as soon as possible for a visit in 1 week(s).   Specialty:  Cardiology Contact information: [place] [address] [phone]           Signed: Birdie Riddle 08/14/2017, 6:35 PM

## 2017-08-15 NOTE — Care Management Important Message (Signed)
Important Message  Patient Details  Name: Patricia English MRN: [id number] Date of Birth: 02-26-1927   Medicare Important Message Given:  Yes    Damian Hofstra 08/15/2017, 8:30 AM

## 2017-09-26 ENCOUNTER — Encounter: Payer: Self-pay | Admitting: Internal Medicine

## 2017-11-14 DIAGNOSIS — K922 Gastrointestinal hemorrhage, unspecified: Secondary | ICD-10-CM

## 2017-11-14 HISTORY — DX: Gastrointestinal hemorrhage, unspecified: K92.2

## 2017-11-21 ENCOUNTER — Ambulatory Visit (INDEPENDENT_AMBULATORY_CARE_PROVIDER_SITE_OTHER): Payer: Medicare Other | Admitting: Internal Medicine

## 2017-11-21 ENCOUNTER — Encounter: Payer: Self-pay | Admitting: Internal Medicine

## 2017-11-21 VITALS — BP 148/62 | HR 64 | Ht 69.0 in | Wt 165.0 lb

## 2017-11-21 DIAGNOSIS — R490 Dysphonia: Secondary | ICD-10-CM | POA: Diagnosis not present

## 2017-11-21 DIAGNOSIS — J384 Edema of larynx: Secondary | ICD-10-CM | POA: Diagnosis not present

## 2017-11-21 DIAGNOSIS — R1314 Dysphagia, pharyngoesophageal phase: Secondary | ICD-10-CM | POA: Diagnosis not present

## 2017-11-21 NOTE — Progress Notes (Signed)
Patricia English 84 y.o. 1927/04/18 [id number]  Assessment & Plan:   Encounter Diagnoses  Name Primary?  Marland Kitchen Dysphagia, pharyngoesophageal phase Yes  . Dysphonia   . Laryngeal edema determined by laryngoscopy     She is much better but still having some persistent problems and I think it is best that she have a ENT follow-up.  If Dr. Redmond Baseman who has graciously agreed to see her today due to a cancellation, thinks she needs upper endoscopy again I can perform that we could consider a barium swallow but I think her problems seem to be in her pharynx or larynx area based upon history and previous information.  I appreciate the opportunity to care for this patient. CC: [name], MD Dr. Melida Quitter    Subjective:   Chief Complaint: Low up of dysphagia  HPI The patient is here with her daughter for follow-up of dysphagia.  I met her in the hospital this summer and she had left-sided laryngeal edema after choking on watermelon.  Dr. Redmond Baseman saw her and saw laryngeal edema.  Modified barium swallow showed pharyngoesophageal dysphagia.  I saw  laryngeal edema on EGD.  She is significantly better after treatment with steroids, but still has intermittent hoarseness and dysphagia to liquids and solids.  Her daughter reports weight is increasing.  She had apparently lost 20 pounds.  Wt Readings from Last 3 Encounters:  11/21/17 165 lb (74.8 kg)  08/13/17 186 lb 4.6 oz (84.5 kg)  11/22/13 170 lb (77.1 kg)    No Known Allergies Current Meds  Medication Sig  . aspirin EC 81 MG tablet Take 81 mg by mouth at bedtime.  . Coenzyme Q10 100 MG TABS Take 100 mg by mouth daily.  . digoxin (LANOXIN) 0.25 MG tablet Take 0.5 tablets (0.125 mg total) by mouth daily.  Marland Kitchen diltiazem (DILACOR XR) 180 MG 24 hr capsule Take 180 mg by mouth 2 (two) times daily.  . diphenhydrAMINE (BENADRYL) 25 mg capsule Take 1 capsule (25 mg total) by mouth at bedtime.  . feeding supplement, GLUCERNA SHAKE, (GLUCERNA  SHAKE) LIQD Take 237 mLs by mouth 3 (three) times daily with meals.  Marland Kitchen glipiZIDE (GLUCOTROL XL) 5 MG 24 hr tablet Take 5 mg by mouth every evening.   Marland Kitchen guaiFENesin-dextromethorphan (ROBITUSSIN DM) 100-10 MG/5ML syrup Take 5 mLs by mouth every 4 (four) hours while awake.  . isosorbide mononitrate (IMDUR) 30 MG 24 hr tablet Take 30 mg by mouth every evening.   . metFORMIN (GLUCOPHAGE) 500 MG tablet Take 1,000 mg by mouth 2 (two) times daily with a meal.  . metoCLOPramide (REGLAN) 5 MG tablet Take 1 tablet (5 mg total) by mouth 3 (three) times daily before meals.  . metoprolol (LOPRESSOR) 50 MG tablet Take 25-50 mg by mouth See admin instructions. Takes 1/2 tablet (25 mg) in the morning and 1 tablet (74m) at night   . pravastatin (PRAVACHOL) 40 MG tablet Take 40 mg by mouth daily at 6 PM.   Past Medical History:  Diagnosis Date  . Coronary artery disease   . Diabetes mellitus without complication ([place]   . Hypertension    Past Surgical History:  Procedure Laterality Date  . ESOPHAGOGASTRODUODENOSCOPY (EGD) WITH PROPOFOL N/A 08/13/2017   Procedure: ESOPHAGOGASTRODUODENOSCOPY (EGD) WITH PROPOFOL;  Surgeon: GGatha Mayer MD;  [place]  Service: Endoscopy;  Laterality: N/A;   Social History   Social History Narrative   Patient is widowed   Denies alcohol tobacco use   family  history is not on file.   Review of Systems See HPI  Objective:   Physical Exam BP (!) 148/62   Pulse 64   Ht _0  (1.753 m)   Wt 165 lb (74.8 kg)   BMI 24.37 kg/m  Alert and oriented x3 with an appropriate mood and affect Eyes are anicteric Mouth and posterior pharynx looks normal to me Neck is supple without tenderness thyromegaly or mass or lymphadenopathy

## 2017-11-21 NOTE — Patient Instructions (Signed)
  We would like you to follow up with Dr Melida Quitter today at 12:50pm. Their phone number is [phone].    I appreciate the opportunity to care for you. [name], MD, [facility]

## 2017-11-24 ENCOUNTER — Encounter: Payer: Self-pay | Admitting: Internal Medicine

## 2017-12-08 ENCOUNTER — Inpatient Hospital Stay (HOSPITAL_COMMUNITY)
Admission: EM | Admit: 2017-12-08 | Discharge: 2017-12-11 | DRG: 379 | Disposition: A | Discharge status: Home / Self Care | Payer: Medicare Other | Attending: Cardiovascular Disease | Admitting: Cardiovascular Disease

## 2017-12-08 ENCOUNTER — Encounter (HOSPITAL_COMMUNITY): Payer: Self-pay | Admitting: *Deleted

## 2017-12-08 ENCOUNTER — Other Ambulatory Visit: Payer: Self-pay

## 2017-12-08 DIAGNOSIS — K644 Residual hemorrhoidal skin tags: Secondary | ICD-10-CM | POA: Diagnosis not present

## 2017-12-08 DIAGNOSIS — Z7984 Long term (current) use of oral hypoglycemic drugs: Secondary | ICD-10-CM

## 2017-12-08 DIAGNOSIS — K922 Gastrointestinal hemorrhage, unspecified: Secondary | ICD-10-CM | POA: Diagnosis present

## 2017-12-08 DIAGNOSIS — K648 Other hemorrhoids: Secondary | ICD-10-CM | POA: Diagnosis not present

## 2017-12-08 DIAGNOSIS — Z7982 Long term (current) use of aspirin: Secondary | ICD-10-CM

## 2017-12-08 DIAGNOSIS — D649 Anemia, unspecified: Secondary | ICD-10-CM | POA: Diagnosis present

## 2017-12-08 DIAGNOSIS — Z79899 Other long term (current) drug therapy: Secondary | ICD-10-CM | POA: Diagnosis not present

## 2017-12-08 DIAGNOSIS — E119 Type 2 diabetes mellitus without complications: Secondary | ICD-10-CM | POA: Diagnosis present

## 2017-12-08 DIAGNOSIS — I2510 Atherosclerotic heart disease of native coronary artery without angina pectoris: Secondary | ICD-10-CM | POA: Diagnosis present

## 2017-12-08 DIAGNOSIS — I10 Essential (primary) hypertension: Secondary | ICD-10-CM | POA: Diagnosis present

## 2017-12-08 DIAGNOSIS — K5730 Diverticulosis of large intestine without perforation or abscess without bleeding: Secondary | ICD-10-CM | POA: Diagnosis present

## 2017-12-08 DIAGNOSIS — K642 Third degree hemorrhoids: Secondary | ICD-10-CM | POA: Diagnosis present

## 2017-12-08 DIAGNOSIS — K625 Hemorrhage of anus and rectum: Secondary | ICD-10-CM | POA: Diagnosis present

## 2017-12-08 DIAGNOSIS — R1310 Dysphagia, unspecified: Secondary | ICD-10-CM | POA: Diagnosis present

## 2017-12-08 HISTORY — DX: Gastrointestinal hemorrhage, unspecified: K92.2

## 2017-12-08 LAB — CBC
HEMATOCRIT: 40.8 % (ref 36.0–46.0)
HEMOGLOBIN: 12.7 g/dL (ref 12.0–15.0)
MCH: 28.9 pg (ref 26.0–34.0)
MCHC: 31.1 g/dL (ref 30.0–36.0)
MCV: 92.9 fL (ref 80.0–100.0)
NRBC: 0 % (ref 0.0–0.2)
Platelets: 313 10*3/uL (ref 150–400)
RBC: 4.39 MIL/uL (ref 3.87–5.11)
RDW: 12.7 % (ref 11.5–15.5)
WBC: 10.7 10*3/uL — ABNORMAL HIGH (ref 4.0–10.5)

## 2017-12-08 LAB — COMPREHENSIVE METABOLIC PANEL
ALT: 9 U/L (ref 0–44)
AST: 18 U/L (ref 15–41)
Albumin: 4.5 g/dL (ref 3.5–5.0)
Alkaline Phosphatase: 53 U/L (ref 38–126)
Anion gap: 10 (ref 5–15)
BUN: 11 mg/dL (ref 8–23)
CHLORIDE: 108 mmol/L (ref 98–111)
CO2: 21 mmol/L — AB (ref 22–32)
CREATININE: 1 mg/dL (ref 0.44–1.00)
Calcium: 9.4 mg/dL (ref 8.9–10.3)
GFR calc non Af Amer: 49 mL/min — ABNORMAL LOW (ref >60)
GFR, EST AFRICAN AMERICAN: 57 mL/min — AB (ref >60)
Glucose, Bld: 127 mg/dL — ABNORMAL HIGH (ref 70–99)
POTASSIUM: 3.6 mmol/L (ref 3.5–5.1)
SODIUM: 139 mmol/L (ref 135–145)
Total Bilirubin: 0.9 mg/dL (ref 0.3–1.2)
Total Protein: 7.7 g/dL (ref 6.5–8.1)

## 2017-12-08 LAB — TYPE AND SCREEN
ABO/RH(D): O POS
ANTIBODY SCREEN: NEGATIVE

## 2017-12-08 LAB — PROTIME-INR
INR: 1.06
PROTHROMBIN TIME: 13.7 s (ref 11.4–15.2)

## 2017-12-08 LAB — DIGOXIN LEVEL: DIGOXIN LVL: 0.2 ng/mL — AB (ref 0.8–2.0)

## 2017-12-08 LAB — CBG MONITORING, ED: Glucose-Capillary: 150 mg/dL — ABNORMAL HIGH (ref 70–99)

## 2017-12-08 MED ORDER — DILTIAZEM HCL ER COATED BEADS 180 MG PO CP24
180.0000 mg | ORAL_CAPSULE | Freq: Two times a day (BID) | ORAL | Status: DC
  Administered 2017-12-08 – 2017-12-10 (×6): 180 mg via ORAL
  Filled 2017-12-08 (×10): qty 1

## 2017-12-08 MED ORDER — METFORMIN HCL 500 MG PO TABS
500.0000 mg | ORAL_TABLET | Freq: Two times a day (BID) | ORAL | Status: DC
  Administered 2017-12-08 – 2017-12-11 (×5): 500 mg via ORAL
  Filled 2017-12-08 (×5): qty 1

## 2017-12-08 MED ORDER — GLUCERNA SHAKE PO LIQD
237.0000 mL | Freq: Three times a day (TID) | ORAL | Status: DC
  Administered 2017-12-11: 237 mL via ORAL
  Filled 2017-12-08: qty 237

## 2017-12-08 MED ORDER — ISOSORBIDE MONONITRATE ER 30 MG PO TB24
30.0000 mg | ORAL_TABLET | Freq: Every evening | ORAL | Status: DC
  Administered 2017-12-08 – 2017-12-10 (×3): 30 mg via ORAL
  Filled 2017-12-08 (×4): qty 1

## 2017-12-08 MED ORDER — GLIPIZIDE ER 5 MG PO TB24
5.0000 mg | ORAL_TABLET | Freq: Every evening | ORAL | Status: DC
  Administered 2017-12-08 – 2017-12-10 (×3): 5 mg via ORAL
  Filled 2017-12-08 (×4): qty 1

## 2017-12-08 MED ORDER — PRAVASTATIN SODIUM 40 MG PO TABS
40.0000 mg | ORAL_TABLET | Freq: Every day | ORAL | Status: DC
  Administered 2017-12-08 – 2017-12-10 (×3): 40 mg via ORAL
  Filled 2017-12-08 (×3): qty 1

## 2017-12-08 MED ORDER — METOPROLOL TARTRATE 25 MG PO TABS
25.0000 mg | ORAL_TABLET | Freq: Two times a day (BID) | ORAL | Status: DC
  Administered 2017-12-08 – 2017-12-10 (×6): 25 mg via ORAL
  Filled 2017-12-08 (×7): qty 1

## 2017-12-08 NOTE — ED Triage Notes (Signed)
Pt in c/o GI bleeding that started this morning, describes blood as bright red and it is coming out constantly, history of same, denies pain

## 2017-12-08 NOTE — ED Notes (Signed)
Called pharmacy to verify admin of diltiazem and metoprolol

## 2017-12-08 NOTE — Consult Note (Addendum)
[facility] Consult: 4:02 PM 12/08/2017  LOS: 0 days    Referring Provider: Dr Doylene Canard  Primary Care Physician:  [name], MD Primary Gastroenterologist:  Dr Carlean Purl and Dr. Penelope Coop    Reason for Consultation: Painless hematochezia   HPI: Patricia English is a 84 y.o. female.  Hx HTN.  DM 2 on oral med.  Endometrial polyps causing postmenopausal bleeding 2009, s/p hysteroscopy, D&C, endometrial polypectomy.   Lower GI bleeding in the setting of Coumadin, elevated INR 09/2012.  Dr. Teena Irani did not pursue colonoscopy or endoscopy.  She no longer takes Coumadin Colonoscopy 11/2008: By Dr. Penelope Coop.  No report but pathology confirms a TVA.     03/2013 colonoscopy for polyp surveillance showed nonthrombosed external hemorrhoids.  Diverticulosis in the ascending, descending and sigmoid colon. 08/11/2017 modified barium swallow study.  For dysphagia. Not able to efficiently clear boluses from the pharynx into the esophagus.  Reduced UES opening and prominent CP with residue increasing as textures become more solid.  Requires several swallows to clear the residue.  Overall diagnosis was that of pharyngo-esophageal phase dysphagia with moderate aspiration risk 08/13/17 EGD for dysphagia.  Dr. Carlean Purl noted laryngeal edema especially in the left arytenoid.  This was not obstructing the airway.  Esophagus, stomach, duodenum normal. The edema was attributed to lisinopril which was discontinued. Dr. Carlean Purl referred her referred to ENT Dr. Redmond Baseman 2 weeks ago after seeing her in the office for her dysphagia follow-up.   Dysphagia had improved with steroid treatment but she was still experiencing hoarseness and liquid/solid dysphagia.  She had begun to gain weight after a 20 pound weight loss. Overall her hoarseness and dysphagia  continues to improve.  She presented to the ED this morning after episodes of painless hematochezia starting this morning.  Began about 5 AM and continued for a few hours this morning.  Then it stopped.  She had another stool at about 330 today.  Still no pain.  No nausea.  She uses a baby aspirin but no NSAIDs. In the ED there is been no hypotension, no tachycardia.  Excellent oxygen saturations, no fever. Hgb 12.7, was 13.2 on 08/11/17.   MCV 92.9 BUN normal.    Past Medical History:  Diagnosis Date  . Coronary artery disease   . Diabetes mellitus without complication ([place])   . Hypertension     Past Surgical History:  Procedure Laterality Date  . ESOPHAGOGASTRODUODENOSCOPY (EGD) WITH PROPOFOL N/A 08/13/2017   Procedure: ESOPHAGOGASTRODUODENOSCOPY (EGD) WITH PROPOFOL;  Surgeon: [name], MD;  Location: [facility];  Service: Endoscopy;  Laterality: N/A;    Prior to Admission medications   Medication Sig Start Date End Date Taking? Authorizing Provider  Cholecalciferol (VITAMIN D-1000 MAX ST) 1000 units tablet Take 1,000 Units by mouth daily.   Yes [provider]  Coenzyme Q10 100 MG TABS Take 100 mg by mouth daily.   Yes [provider]  digoxin (LANOXIN) 0.25 MG tablet Take 0.5 tablets (0.125 mg total) by mouth daily. 08/14/17  Yes [name], MD  diltiazem (DILACOR XR)  180 MG 24 hr capsule Take 180 mg by mouth 2 (two) times daily.   Yes [provider]  feeding supplement, GLUCERNA SHAKE, (GLUCERNA SHAKE) LIQD Take 237 mLs by mouth 3 (three) times daily with meals. 08/15/17  Yes [name], MD  glipiZIDE (GLUCOTROL XL) 5 MG 24 hr tablet Take 5 mg by mouth every evening.    Yes [provider]  isosorbide mononitrate (IMDUR) 30 MG 24 hr tablet Take 30 mg by mouth every evening.    Yes [provider]  metFORMIN (GLUCOPHAGE) 500 MG tablet Take 500 mg by mouth 2 (two) times daily with a meal.    Yes [provider]    metoprolol (LOPRESSOR) 50 MG tablet Take 25 mg by mouth 2 (two) times daily.    Yes [provider]  pravastatin (PRAVACHOL) 40 MG tablet Take 40 mg by mouth daily at 6 PM. 06/06/17  Yes [provider]    Scheduled Meds: . diltiazem  180 mg Oral BID  . feeding supplement (GLUCERNA SHAKE)  237 mL Oral TID WC  . glipiZIDE  5 mg Oral QPM  . isosorbide mononitrate  30 mg Oral QPM  . metFORMIN  500 mg Oral BID WC  . metoprolol tartrate  25 mg Oral BID  . pravastatin  40 mg Oral q1800   Infusions:  PRN Meds:    Allergies as of 12/08/2017 - Review Complete 12/08/2017  Allergen Reaction Noted  . Lisinopril  12/08/2017    History reviewed. No pertinent family history.  Social History   Socioeconomic History  . Marital status: Widowed    Spouse name: Not on file  . Number of children: Not on file  . Years of education: Not on file  . Highest education level: Not on file  Occupational History  . Not on file  Social Needs  . Financial resource strain: Not on file  . Food insecurity:    Worry: Not on file    Inability: Not on file  . Transportation needs:    Medical: Not on file    Non-medical: Not on file  Tobacco Use  . Smoking status: Never Smoker  . Smokeless tobacco: Never Used  Substance and Sexual Activity  . Alcohol use: No  . Drug use: Not on file  . Sexual activity: Not on file  Lifestyle  . Physical activity:    Days per week: Not on file    Minutes per session: Not on file  . Stress: Not on file  Relationships  . Social connections:    Talks on phone: Not on file    Gets together: Not on file    Attends religious service: Not on file    Active member of club or organization: Not on file    Attends meetings of clubs or organizations: Not on file    Relationship status: Not on file  . Intimate partner violence:    Fear of current or ex partner: Not on file    Emotionally abused: Not on file    Physically abused: Not on file     Forced sexual activity: Not on file  Other Topics Concern  . Not on file  Social History Narrative   Patient is widowed   Denies alcohol tobacco use    REVIEW OF SYSTEMS: Constitutional: No weakness, no dizziness ENT:  No nose bleeds Pulm: No shortness of breath or cough.  Hoarseness improving. CV:  No palpitations, no LE edema.  Chest pain GU:  No hematuria, no frequency GI:  PER hpi Heme: No unusual bleeding or bruising Transfusions: None Neuro:  No headaches, no peripheral tingling or numbness.  No syncope. Derm:  No itching, no rash or sores.  Endocrine:  No sweats or chills.  No polyuria or dysuria Immunization: Did not inquire as to recent vaccinations. Travel:  None beyond local counties in last few months.    PHYSICAL EXAM: Vital signs in last 24 hours: Vitals:   12/08/17 0930 12/08/17 1400  BP: (!) 119/59 (!) 132/59  Pulse: 68 68  Resp: 16 14  Temp:    SpO2: 98% 100%   Wt Readings from Last 3 Encounters:  12/08/17 77.1 kg  11/21/17 74.8 kg  08/13/17 84.5 kg    General: Elderly, well-appearing, pleasant and alert AAF.  She is sitting up in the hospital bed eating her clear liquid diet. Head: No facial asymmetry or swelling.  No signs of head trauma. Eyes: Scleral icterus.  No conjunctival pallor.  EOMI Ears: Not hard of hearing Nose: No congestion or discharge. Mouth: Moist, pink, clear oral mucosa.  Tongue midline. Neck: No JVD, no masses, no thyromegaly Lungs: Voice is a bit hoarse/raspy but strong.  Lungs are clear bilaterally with good breath sounds throughout.  No dyspnea or cough Heart: RRR.  No MRG.  S1, S2 present Abdomen: Soft.  Nondistended, nontender.  No HSM, masses, bruits, hernias.  Bowel sounds active.   Rectal: Rectal exam was not repeated.  Dr. Sabra Heck performed anoscopy earlier this morning which showed nonbleeding, soft, nontender hemorrhoids.  There was some blood and mucoid material in the rectal vault but nothing palpable. Musc/Skeltl:  No joint redness, swelling or gross deformity. Extremities: CCE. Neurologic: Oriented.  Moves all 4 limbs.  No tremors.  No weakness.  No deficits Skin: No rashes, no sores, suspicious lesions Tattoos: None Nodes: No cervical adenopathy. Psych: Operative, pleasant, fluid speech.  Normal, pleasant affect  Intake/Output from previous day: No intake/output data recorded. Intake/Output this shift: No intake/output data recorded.  LAB RESULTS: Recent Labs    12/08/17 0758  WBC 10.7*  HGB 12.7  HCT 40.8  PLT 313   BMET Lab Results  Component Value Date   NA 139 12/08/2017   NA 136 08/14/2017   NA 137 08/11/2017   K 3.6 12/08/2017   K 4.8 08/14/2017   K 4.7 08/11/2017   CL 108 12/08/2017   CL 102 08/14/2017   CL 105 08/11/2017   CO2 21 (L) 12/08/2017   CO2 21 (L) 08/14/2017   CO2 23 08/11/2017   GLUCOSE 127 (H) 12/08/2017   GLUCOSE 189 (H) 08/14/2017   GLUCOSE 173 (H) 08/11/2017   BUN 11 12/08/2017   BUN 36 (H) 08/14/2017   BUN 16 08/11/2017   CREATININE 1.00 12/08/2017   CREATININE 1.44 (H) 08/14/2017   CREATININE 1.34 (H) 08/11/2017   CALCIUM 9.4 12/08/2017   CALCIUM 9.3 08/14/2017   CALCIUM 9.6 08/11/2017   LFT Recent Labs    12/08/17 0758  PROT 7.7  ALBUMIN 4.5  AST 18  ALT 9  ALKPHOS 53  BILITOT 0.9   PT/INR Lab Results  Component Value Date   INR 1.06 12/08/2017   INR 1.80 (H) 01/04/2013   INR 1.77 (H) 01/03/2013   Hepatitis Panel No results for input(s): HEPBSAG, HCVAB, HEPAIGM, HEPBIGM in the last 72 hours. C-Diff No components found for: CDIFF Lipase     Component Value Date/Time   LIPASE 46 10/13/2012 1314    Drugs  of Abuse  No results found for: LABOPIA, COCAINSCRNUR, LABBENZ, AMPHETMU, THCU, LABBARB   RADIOLOGY STUDIES: No results found.    IMPRESSION:   *   Painless hematochezia.  Suspect diverticular source. Patient is 65 so not clear she needs another colonoscopy.  She did have an adenomatous polyp in 2010 but no  recurrent polyps in 2015. Hgb is excellent.  *   History lisinopril associated laryngeal, arytenoid edema.  Associated dysphasia and vocal hoarseness improving since initial onset 07/2017.   PLAN:     *    Consider nuclear medicine tagged RBC scan.  Did not order this because it sounds like the bleeding has slowed down.  *    Continue clear liquid diet.  Perhaps could even have full liquids.  *    CBC in the morning   Azucena Freed  12/08/2017, 4:02 PM Phone [phone]

## 2017-12-08 NOTE — ED Provider Notes (Signed)
[facility] Provider Note   CSN: [id number] Arrival date & time: 12/08/17  0744     History   Chief Complaint Chief Complaint  Patient presents with  . GI Bleeding    HPI Patricia English is a 84 y.o. female.  HPI  The patient is an 84 year old female, she has a known history of diabetes, hypertension, coronary disease, she is currently taking a baby aspirin every day, she is also on digoxin, she has a history of gastrointestinal bleeding when she was on Coumadin after being treated in the past for arterial sclerosis.  She presents today with what appears to be lower GI bleeding.  She reports that this morning she went to the bathroom, she did have a very small stool, she did not noticing anything abnormal until she went back in the bathroom and noticed blood in the commode.  She has had several more episodes of bleeding from the rectum since, she feels like it is a constant bleed.  She has no abdominal pain nausea or vomiting, the symptoms are persistent, similar to what she had in the past.  She is unclear as to who her gastroenterologist is.  Is taken no medications for the bleeding prior to arrival.  The medical record Dr. Michelene Heady is the gastroenterologist that performed the endoscopy in June after she had dysphagia.  The upper endoscopy that she had showed essentially normal esophagus and gastric tissues, she did have some angioedema of 1 of her arytenoids in the larynx.  Past Medical History:  Diagnosis Date  . Coronary artery disease   . Diabetes mellitus without complication ([place])   . Hypertension     Patient Active Problem List   Diagnosis Date Noted  . Dysphagia 08/11/2017    Class: Acute    Past Surgical History:  Procedure Laterality Date  . ESOPHAGOGASTRODUODENOSCOPY (EGD) WITH PROPOFOL N/A 08/13/2017   Procedure: ESOPHAGOGASTRODUODENOSCOPY (EGD) WITH PROPOFOL;  Surgeon: [name], MD;  Location: [facility];   Service: Endoscopy;  Laterality: N/A;     OB History   None      Home Medications    Prior to Admission medications   Medication Sig Start Date End Date Taking? Authorizing Provider  aspirin EC 81 MG tablet Take 81 mg by mouth every Monday, Wednesday, and Friday.    Yes [provider]  Cholecalciferol (VITAMIN D-1000 MAX ST) 1000 units tablet Take 1,000 Units by mouth daily.   Yes [provider]  Coenzyme Q10 100 MG TABS Take 100 mg by mouth daily.   Yes [provider]  digoxin (LANOXIN) 0.25 MG tablet Take 0.5 tablets (0.125 mg total) by mouth daily. 08/14/17  Yes [name], MD  diltiazem (DILACOR XR) 180 MG 24 hr capsule Take 180 mg by mouth 2 (two) times daily.   Yes [provider]  feeding supplement, GLUCERNA SHAKE, (GLUCERNA SHAKE) LIQD Take 237 mLs by mouth 3 (three) times daily with meals. 08/15/17  Yes [name], MD  glipiZIDE (GLUCOTROL XL) 5 MG 24 hr tablet Take 5 mg by mouth every evening.    Yes [provider]  isosorbide mononitrate (IMDUR) 30 MG 24 hr tablet Take 30 mg by mouth every evening.    Yes [provider]  metFORMIN (GLUCOPHAGE) 500 MG tablet Take 500 mg by mouth 2 (two) times daily with a meal.    Yes [provider]  metoprolol (LOPRESSOR) 50 MG tablet Take 25 mg by mouth 2 (two)  times daily.    Yes [provider]  pravastatin (PRAVACHOL) 40 MG tablet Take 40 mg by mouth daily at 6 PM. 06/06/17  Yes [provider]  diphenhydrAMINE (BENADRYL) 25 mg capsule Take 1 capsule (25 mg total) by mouth at bedtime. Patient not taking: Reported on 12/08/2017 08/14/17   [name], MD  guaiFENesin-dextromethorphan (ROBITUSSIN DM) 100-10 MG/5ML syrup Take 5 mLs by mouth every 4 (four) hours while awake. Patient not taking: Reported on 12/08/2017 08/14/17   [name], MD  metoCLOPramide (REGLAN) 5 MG tablet Take 1 tablet (5 mg total) by mouth 3 (three) times daily before  meals. Patient not taking: Reported on 12/08/2017 08/15/17   [name], MD    Family History History reviewed. No pertinent family history.  Social History Social History   Tobacco Use  . Smoking status: Never Smoker  . Smokeless tobacco: Never Used  Substance Use Topics  . Alcohol use: No  . Drug use: Not on file     Allergies   Patient has no known allergies.   Review of Systems Review of Systems  All other systems reviewed and are negative.    Physical Exam Updated Vital Signs BP (!) 119/59   Pulse 68   Temp 98.9 F (37.2 C) (Oral)   Resp 16   Ht 1.753 m (5\' 9" )   Wt 77.1 kg   SpO2 98%   BMI 25.10 kg/m   Physical Exam  Constitutional: She appears well-developed and well-nourished. No distress.  HENT:  Head: Normocephalic and atraumatic.  Mouth/Throat: Oropharynx is clear and moist. No oropharyngeal exudate.  Eyes: Pupils are equal, round, and reactive to light. Conjunctivae and EOM are normal. Right eye exhibits no discharge. Left eye exhibits no discharge. No scleral icterus.  Neck: Normal range of motion. Neck supple. No JVD present. No thyromegaly present.  Cardiovascular: Normal rate, regular rhythm, normal heart sounds and intact distal pulses. Exam reveals no gallop and no friction rub.  No murmur heard. Pulmonary/Chest: Effort normal and breath sounds normal. No respiratory distress. She has no wheezes. She has no rales.  Abdominal: Soft. Bowel sounds are normal. She exhibits no distension and no mass. There is no tenderness.  Genitourinary:  Genitourinary Comments: Chaperone present for exam, digital exam shows multiple nonbleeding soft nontender hemorrhoids, nothing in the rectal vault, gross blood and mucoid blood present in the rectal vault  Musculoskeletal: Normal range of motion. She exhibits no edema or tenderness.  Lymphadenopathy:    She has no cervical adenopathy.  Neurological: She is alert. Coordination normal.  Skin: Skin is warm and  dry. No rash noted. No erythema.  Psychiatric: She has a normal mood and affect. Her behavior is normal.  Nursing note and vitals reviewed.    ED Treatments / Results  Labs (all labs ordered are listed, but only abnormal results are displayed) Labs Reviewed  COMPREHENSIVE METABOLIC PANEL - Abnormal; Notable for the following components:      Result Value   CO2 21 (*)    Glucose, Bld 127 (*)    GFR calc non Af Amer 49 (*)    GFR calc Af Amer 57 (*)    All other components within normal limits  CBC - Abnormal; Notable for the following components:   WBC 10.7 (*)    All other components within normal limits  DIGOXIN LEVEL - Abnormal; Notable for the following components:   Digoxin Level 0.2 (*)    All other components within normal limits  PROTIME-INR  POC OCCULT BLOOD, ED  TYPE AND SCREEN    EKG None  Radiology No results found.  Procedures Procedures (including critical care time)  Procedure Note:  Anoscopy  Risks benefits alternatives of the procedure given to the patient Verbal Consent obtained Patient placed in the lateral decubitus position Anoscopy performed Findings the patient has multiple soft nontender nonbleeding hemorrhoids, the endoscope was advanced, there is no apparent source of bleeding in the rectum, there was some mucoid material which was blood-tinged. Patient tolerated procedure without any complaints   Medications Ordered in ED Medications - No data to display   Initial Impression / Assessment and Plan / ED Course  I have reviewed the triage vital signs and the nursing notes.  Pertinent labs & imaging results that were available during my care of the patient were reviewed by me and considered in my medical decision making (see chart for details).     At this time the patient will need further evaluation for gastrointestinal bleeding, she does report a history of a diverticular bleed, at this time the patient's source of bleeding is not  obvious, the hemorrhoids were inspected and there was no obvious bleeding hemorrhoid.  The blood appears to be bright red.  She does have some mucoid red blood in the rectal vault.  She is otherwise in no distress with a soft nontender abdomen, normal blood pressure, normal heart rate and she is afebrile.  Labs pending, anticipate admission  Hemoglobin stable at 12.7, discussed with the patient's primary doctor, Dr. Doylene Canard who will admit the patient to the hospital.  Final Clinical Impressions(s) / ED Diagnoses   Final diagnoses:  Lower GI bleed      Noemi Chapel, MD 12/08/17 1100

## 2017-12-08 NOTE — H&P (Signed)
Referring Physician:   Calandria Mullings is an 84 y.o. female.                       Chief Complaint: Rectal bleed  HPI: 84 year old female with PMH of hypertension, type 2 DM and CAD has bright red bleeding this AM. Patient denies abdominal pain, nausea or vomiting. No fever or weakness or dizziness.  Past Medical History:  Diagnosis Date  . Coronary artery disease   . Diabetes mellitus without complication ([place])   . Hypertension       Past Surgical History:  Procedure Laterality Date  . ESOPHAGOGASTRODUODENOSCOPY (EGD) WITH PROPOFOL N/A 08/13/2017   Procedure: ESOPHAGOGASTRODUODENOSCOPY (EGD) WITH PROPOFOL;  Surgeon: [name], MD;  Location: [facility];  Service: Endoscopy;  Laterality: N/A;    History reviewed. No pertinent family history. Social History:  reports that she has never smoked. She has never used smokeless tobacco. She reports that she does not drink alcohol. Her drug history is not on file.  Allergies: No Known Allergies   (Not in a hospital admission)  Results for orders placed or performed during the hospital encounter of 12/08/17 (from the past 48 hour(s))  Type and screen [place]     Status: None   Collection Time: 12/08/17  7:55 AM  Result Value Ref Range   ABO/RH(D) O POS    Antibody Screen NEG    Sample Expiration      12/11/2017 Performed at [facility], [place] [address], [place], [place] [address]   Comprehensive metabolic panel     Status: Abnormal   Collection Time: 12/08/17  7:58 AM  Result Value Ref Range   Sodium 139 135 - 145 mmol/L   Potassium 3.6 3.5 - 5.1 mmol/L   Chloride 108 98 - 111 mmol/L   CO2 21 (L) 22 - 32 mmol/L   Glucose, Bld 127 (H) 70 - 99 mg/dL   BUN 11 8 - 23 mg/dL   Creatinine, Ser 1.00 0.44 - 1.00 mg/dL   Calcium 9.4 8.9 - 10.3 mg/dL   Total Protein 7.7 6.5 - 8.1 g/dL   Albumin 4.5 3.5 - 5.0 g/dL   AST 18 15 - 41 U/L   ALT 9 0 - 44 U/L   Alkaline Phosphatase 53 38 - 126 U/L   Total  Bilirubin 0.9 0.3 - 1.2 mg/dL   GFR calc non Af Amer 49 (L) >60 mL/min   GFR calc Af Amer 57 (L) >60 mL/min    Comment: (NOTE) The eGFR has been calculated using the CKD EPI equation. This calculation has not been validated in all clinical situations. eGFR's persistently <60 mL/min signify possible Chronic Kidney Disease.    Anion gap 10 5 - 15    Comment: Performed at [facility] [address], [place], [place] [address]  CBC     Status: Abnormal   Collection Time: 12/08/17  7:58 AM  Result Value Ref Range   WBC 10.7 (H) 4.0 - 10.5 K/uL   RBC 4.39 3.87 - 5.11 MIL/uL   Hemoglobin 12.7 12.0 - 15.0 g/dL   HCT 40.8 36.0 - 46.0 %   MCV 92.9 80.0 - 100.0 fL   MCH 28.9 26.0 - 34.0 pg   MCHC 31.1 30.0 - 36.0 g/dL   RDW 12.7 11.5 - 15.5 %   Platelets 313 150 - 400 K/uL   nRBC 0.0 0.0 - 0.2 %  Comment: Performed at [facility], [place] [address] [place], [place] [address]  Protime-INR     Status: None   Collection Time: 12/08/17  8:59 AM  Result Value Ref Range   Prothrombin Time 13.7 11.4 - 15.2 seconds   INR 1.06     Comment: Performed at [facility], [place] [address], [place], [place] [address]  Digoxin level     Status: Abnormal   Collection Time: 12/08/17  8:59 AM  Result Value Ref Range   Digoxin Level 0.2 (L) 0.8 - 2.0 ng/mL    Comment: Performed at [facility], [place] [address], [place], [place] [address]   No results found.  Review Of Systems Constitutional: No fever, chills, weight loss or gain. Eyes: No vision change, wears glasses. No discharge or pain. Ears: No hearing loss, No tinnitus. Respiratory: No asthma, COPD, pneumonias. No shortness of breath. No hemoptysis. Cardiovascular: Positive chest pain, palpitation, no leg edema. Gastrointestinal: No nausea, vomiting, diarrhea, constipation. No GI bleed. No hepatitis. Genitourinary: No dysuria, hematuria, kidney stone. No incontinance. Neurological: No headache, stroke, seizures.   Psychiatry: No psych facility admission for anxiety, depression, suicide. No detox. Skin: No rash. Musculoskeletal: Positive joint pain, no fibromyalgia. No neck pain, back pain. Lymphadenopathy: No lymphadenopathy. Hematology: No anemia or easy bruising.   Blood pressure (!) 119/59, pulse 68, temperature 98.9 F (37.2 C), temperature source Oral, resp. rate 16, height '5\' 9"'$  (1.753 m), weight 77.1 kg, SpO2 98 %. Body mass index is 25.1 kg/m. General appearance: alert, cooperative, appears stated age and no distress Head: Normocephalic, atraumatic. Eyes: Brown eyes, pink conjunctiva, corneas clear. PERRL, EOM's intact. Neck: No adenopathy, no carotid bruit, no JVD, supple, symmetrical, trachea midline and thyroid not enlarged. Resp: Clear to auscultation bilaterally. Cardio: Regular rate and rhythm, S1, S2 normal, II/VI systolic murmur, no click, rub or gallop GI: Soft, non-tender; bowel sounds normal; no organomegaly. Extremities: No edema, cyanosis or clubbing. Skin: Warm and dry.  Neurologic: Alert and oriented X 3, normal strength. Normal coordination and slow gait with cane.  Assessment/Plan Acute lower GI bleed Hypertension Type 2 DM CAD  Admit. IV Protonix Hold aspirin. Gi consult.  Birdie Riddle, MD  12/08/2017, 11:23 AM

## 2017-12-08 NOTE — ED Notes (Signed)
Clear Liquid Diet was ordered for Lunch. 

## 2017-12-09 LAB — HEMOGLOBIN AND HEMATOCRIT, BLOOD
HCT: 33.9 % — ABNORMAL LOW (ref 36.0–46.0)
HEMATOCRIT: 33 % — AB (ref 36.0–46.0)
HEMOGLOBIN: 10.6 g/dL — AB (ref 12.0–15.0)
Hemoglobin: 10.8 g/dL — ABNORMAL LOW (ref 12.0–15.0)

## 2017-12-09 LAB — CBC
HEMATOCRIT: 32.9 % — AB (ref 36.0–46.0)
HEMOGLOBIN: 10.6 g/dL — AB (ref 12.0–15.0)
MCH: 29.1 pg (ref 26.0–34.0)
MCHC: 32.2 g/dL (ref 30.0–36.0)
MCV: 90.4 fL (ref 80.0–100.0)
Platelets: 279 10*3/uL (ref 150–400)
RBC: 3.64 MIL/uL — AB (ref 3.87–5.11)
RDW: 12.7 % (ref 11.5–15.5)
WBC: 9.1 10*3/uL (ref 4.0–10.5)
nRBC: 0 % (ref 0.0–0.2)

## 2017-12-09 LAB — BASIC METABOLIC PANEL
Anion gap: 11 (ref 5–15)
BUN: 9 mg/dL (ref 8–23)
CHLORIDE: 106 mmol/L (ref 98–111)
CO2: 23 mmol/L (ref 22–32)
Calcium: 9.1 mg/dL (ref 8.9–10.3)
Creatinine, Ser: 0.88 mg/dL (ref 0.44–1.00)
GFR calc Af Amer: >60 mL/min (ref >60)
GFR calc non Af Amer: 57 mL/min — ABNORMAL LOW (ref >60)
Glucose, Bld: 101 mg/dL — ABNORMAL HIGH (ref 70–99)
POTASSIUM: 3.5 mmol/L (ref 3.5–5.1)
Sodium: 140 mmol/L (ref 135–145)

## 2017-12-09 MED ORDER — HYDROCORTISONE 2.5 % RE CREA
TOPICAL_CREAM | Freq: Two times a day (BID) | RECTAL | Status: DC
  Administered 2017-12-09 – 2017-12-10 (×3): via RECTAL
  Filled 2017-12-09: qty 28.35

## 2017-12-09 NOTE — Progress Notes (Signed)
Subjective:  Patient denies any chest pain or shortness of breath denies abdominal pain states and bright red blood per rectum earlier this a.m. Followed by GI. Patient has 2 g drop in hemoglobin since yesterday.  Objective:  Vital Signs in the last 24 hours: Temp:  [98.3 F (36.8 C)-98.5 F (36.9 C)] 98.5 F (36.9 C) (10/26 0508) Pulse Rate:  [68-83] 75 (10/26 0847) Resp:  [14-18] 16 (10/26 0508) BP: (94-163)/(48-73) 121/62 (10/26 0847) SpO2:  [97 %-100 %] 100 % (10/26 0847) Weight:  [71.8 kg] 71.8 kg (10/26 0625)  Intake/Output from previous day: No intake/output data recorded. Intake/Output from this shift: No intake/output data recorded.  Physical Exam: Neck: no adenopathy, no carotid bruit, no JVD and supple, symmetrical, trachea midline Lungs: clear to auscultation bilaterally Heart: regular rate and rhythm, S1, S2 normal and 2/6 systolic murmur noted Abdomen: soft, non-tender; bowel sounds normal; no masses,  no organomegaly Extremities: extremities normal, atraumatic, no cyanosis or edema  Lab Results: Recent Labs    12/08/17 0758 12/09/17 0424 12/09/17 0957  WBC 10.7* 9.1  --   HGB 12.7 10.6* 10.6*  PLT 313 279  --    Recent Labs    12/08/17 0758 12/09/17 0424  NA 139 140  K 3.6 3.5  CL 108 106  CO2 21* 23  GLUCOSE 127* 101*  BUN 11 9  CREATININE 1.00 0.88   No results for input(s): TROPONINI in the last 72 hours.  Invalid input(s): CK, MB Hepatic Function Panel Recent Labs    12/08/17 0758  PROT 7.7  ALBUMIN 4.5  AST 18  ALT 9  ALKPHOS 53  BILITOT 0.9   No results for input(s): CHOL in the last 72 hours. No results for input(s): PROTIME in the last 72 hours.  Imaging: Imaging results have been reviewed and No results found.  Cardiac Studies:  Assessment/Plan:  Acute lower chair I bleed Hypertension Type 2 diabetes mellitus Coronary artery disease Plan Continue present management  Follow serial H&H GI following  LOS: 1 day     Charolette Forward 12/09/2017, 12:10 PM

## 2017-12-09 NOTE — H&P (View-Only) (Signed)
     [facility] Progress Note   Chief Complaint:  Rectal bleeding    SUBJECTIVE:    feels okay. Says she had a couple more episodes of rectal bleeding last night.   ASSESSMENT AND PLAN:   1. Painless hematochezia. Reports two episodes during the night. Hgb did drop 2 grams (not getting IVF so not dilutional).  -Bleeding was suspected to be hemorrhoidal and she does have large hemorrhoids on my exam. No blood in vault. However, drop in hgb overnight hard to blame on hemorrhoids. Will recheck H+H now to see if accurate. Will alert nursing staff to let us know if recurrent bleeding and then may need tagged scan. In the interim, will treat with anusol BID. Plan was to start a bulking agent but she typically has loose stools at home and fiber may aggravate that. Can try soluble fiber at discharge, it may absorb some of the excess luminal fluid.  -keep on clear for now until we can see where bleeding is going.   2. Mild [place] anemia. Hgb down overnight from 12.7 to 10.6. -see above      [facility] Attending   I have taken an interval history, reviewed the chart and examined the patient. I agree with the Advanced Practitioner's note, impression and recommendations.  Will sort out with flex sig tomorrow - may band hemorrhoids. The risks and benefits as well as alternatives of endoscopic procedure(s) have been discussed and reviewed. All questions answered. The patient agrees to proceed. [name], MD, [facility] 12/09/2017 4:51 PM Pager [phone]   OBJECTIVE:     Vital signs in last 24 hours: Temp:  [98.3 F (36.8 C)-98.5 F (36.9 C)] 98.5 F (36.9 C) (10/26 0508) Pulse Rate:  [68-83] 75 (10/26 0847) Resp:  [14-18] 16 (10/26 0508) BP: (94-163)/(48-73) 121/62 (10/26 0847) SpO2:  [97 %-100 %] 100 % (10/26 0847) Weight:  [71.8 kg] 71.8 kg (10/26 0625) Last BM Date: 12/08/17 General:   Alert, well-developed female  in NAD EENT:  Normal  hearing, non icteric sclera, conjunctive pink.  Heart:  Regular rate and rhythm; no murmurs. No lower extremity edema Pulm: Normal respiratory effort, lungs CTA bilaterally without wheezes or crackles. Abdomen:  Soft, nondistended, nontender.  Normal bowel sounds, no masses felt.     Rectal: no stool or blood in vault. Large external, non-thrombosed hemorrhoids. Moderate amount of old blood in bad in diaper.  Neurologic:  Alert and  oriented x4;  grossly normal neurologically. Psych:  Pleasant, cooperative.  Normal mood and affect.   Intake/Output from previous day: No intake/output data recorded. Intake/Output this shift: No intake/output data recorded.  Lab Results: Recent Labs    12/08/17 0758 12/09/17 0424  WBC 10.7* 9.1  HGB 12.7 10.6*  HCT 40.8 32.9*  PLT 313 279   BMET Recent Labs    12/08/17 0758 12/09/17 0424  NA 139 140  K 3.6 3.5  CL 108 106  CO2 21* 23  GLUCOSE 127* 101*  BUN 11 9  CREATININE 1.00 0.88  CALCIUM 9.4 9.1   LFT Recent Labs    12/08/17 0758  PROT 7.7  ALBUMIN 4.5  AST 18  ALT 9  ALKPHOS 53  BILITOT 0.9   PT/INR Recent Labs    12/08/17 0859  LABPROT 13.7  INR 1.06     LOS: 1 day   Tye Savoy ,NP 12/09/2017, 9:28 AM

## 2017-12-09 NOTE — Progress Notes (Addendum)
     [facility] Progress Note   Chief Complaint:  Rectal bleeding    SUBJECTIVE:    feels okay. Says she had a couple more episodes of rectal bleeding last night.   ASSESSMENT AND PLAN:   1. Painless hematochezia. Reports two episodes during the night. Hgb did drop 2 grams (not getting IVF so not dilutional).  -Bleeding was suspected to be hemorrhoidal and she does have large hemorrhoids on my exam. No blood in vault. However, drop in hgb overnight hard to blame on hemorrhoids. Will recheck H+H now to see if accurate. Will alert nursing staff to let us know if recurrent bleeding and then may need tagged scan. In the interim, will treat with anusol BID. Plan was to start a bulking agent but she typically has loose stools at home and fiber may aggravate that. Can try soluble fiber at discharge, it may absorb some of the excess luminal fluid.  -keep on clear for now until we can see where bleeding is going.   2. Mild [place] anemia. Hgb down overnight from 12.7 to 10.6. -see above      [facility] Attending   I have taken an interval history, reviewed the chart and examined the patient. I agree with the Advanced Practitioner's note, impression and recommendations.  Will sort out with flex sig tomorrow - may band hemorrhoids. The risks and benefits as well as alternatives of endoscopic procedure(s) have been discussed and reviewed. All questions answered. The patient agrees to proceed. [name], MD, [facility] 12/09/2017 4:51 PM Pager [phone]   OBJECTIVE:     Vital signs in last 24 hours: Temp:  [98.3 F (36.8 C)-98.5 F (36.9 C)] 98.5 F (36.9 C) (10/26 0508) Pulse Rate:  [68-83] 75 (10/26 0847) Resp:  [14-18] 16 (10/26 0508) BP: (94-163)/(48-73) 121/62 (10/26 0847) SpO2:  [97 %-100 %] 100 % (10/26 0847) Weight:  [71.8 kg] 71.8 kg (10/26 0625) Last BM Date: 12/08/17 General:   Alert, well-developed female  in NAD EENT:  Normal  hearing, non icteric sclera, conjunctive pink.  Heart:  Regular rate and rhythm; no murmurs. No lower extremity edema Pulm: Normal respiratory effort, lungs CTA bilaterally without wheezes or crackles. Abdomen:  Soft, nondistended, nontender.  Normal bowel sounds, no masses felt.     Rectal: no stool or blood in vault. Large external, non-thrombosed hemorrhoids. Moderate amount of old blood in bad in diaper.  Neurologic:  Alert and  oriented x4;  grossly normal neurologically. Psych:  Pleasant, cooperative.  Normal mood and affect.   Intake/Output from previous day: No intake/output data recorded. Intake/Output this shift: No intake/output data recorded.  Lab Results: Recent Labs    12/08/17 0758 12/09/17 0424  WBC 10.7* 9.1  HGB 12.7 10.6*  HCT 40.8 32.9*  PLT 313 279   BMET Recent Labs    12/08/17 0758 12/09/17 0424  NA 139 140  K 3.6 3.5  CL 108 106  CO2 21* 23  GLUCOSE 127* 101*  BUN 11 9  CREATININE 1.00 0.88  CALCIUM 9.4 9.1   LFT Recent Labs    12/08/17 0758  PROT 7.7  ALBUMIN 4.5  AST 18  ALT 9  ALKPHOS 53  BILITOT 0.9   PT/INR Recent Labs    12/08/17 0859  LABPROT 13.7  INR 1.06     LOS: 1 day   Tye Savoy ,NP 12/09/2017, 9:28 AM

## 2017-12-10 ENCOUNTER — Encounter (HOSPITAL_COMMUNITY)
Admission: EM | Disposition: A | Discharge status: Home / Self Care | Payer: Self-pay | Source: Home / Self Care | Attending: Cardiovascular Disease

## 2017-12-10 ENCOUNTER — Encounter (HOSPITAL_COMMUNITY): Payer: Self-pay

## 2017-12-10 ENCOUNTER — Other Ambulatory Visit: Payer: Self-pay | Admitting: Internal Medicine

## 2017-12-10 DIAGNOSIS — K648 Other hemorrhoids: Secondary | ICD-10-CM

## 2017-12-10 DIAGNOSIS — K644 Residual hemorrhoidal skin tags: Secondary | ICD-10-CM

## 2017-12-10 HISTORY — PX: HEMORRHOID BANDING: SHX5850

## 2017-12-10 HISTORY — PX: FLEXIBLE SIGMOIDOSCOPY: SHX5431

## 2017-12-10 LAB — CBC
HCT: 31.7 % — ABNORMAL LOW (ref 36.0–46.0)
Hemoglobin: 10.4 g/dL — ABNORMAL LOW (ref 12.0–15.0)
MCH: 29.5 pg (ref 26.0–34.0)
MCHC: 32.8 g/dL (ref 30.0–36.0)
MCV: 90.1 fL (ref 80.0–100.0)
NRBC: 0 % (ref 0.0–0.2)
PLATELETS: 282 10*3/uL (ref 150–400)
RBC: 3.52 MIL/uL — AB (ref 3.87–5.11)
RDW: 12.8 % (ref 11.5–15.5)
WBC: 10.5 10*3/uL (ref 4.0–10.5)

## 2017-12-10 LAB — BASIC METABOLIC PANEL
ANION GAP: 9 (ref 5–15)
BUN: 7 mg/dL — ABNORMAL LOW (ref 8–23)
CALCIUM: 9.1 mg/dL (ref 8.9–10.3)
CO2: 22 mmol/L (ref 22–32)
Chloride: 108 mmol/L (ref 98–111)
Creatinine, Ser: 0.98 mg/dL (ref 0.44–1.00)
GFR, EST AFRICAN AMERICAN: 58 mL/min — AB (ref >60)
GFR, EST NON AFRICAN AMERICAN: 50 mL/min — AB (ref >60)
GLUCOSE: 123 mg/dL — AB (ref 70–99)
POTASSIUM: 3.5 mmol/L (ref 3.5–5.1)
SODIUM: 139 mmol/L (ref 135–145)

## 2017-12-10 SURGERY — SIGMOIDOSCOPY, FLEXIBLE
Anesthesia: Moderate Sedation

## 2017-12-10 MED ORDER — FENTANYL CITRATE (PF) 100 MCG/2ML IJ SOLN
INTRAMUSCULAR | Status: AC
  Filled 2017-12-10: qty 2

## 2017-12-10 MED ORDER — MIDAZOLAM HCL 5 MG/ML IJ SOLN
INTRAMUSCULAR | Status: AC
  Filled 2017-12-10: qty 2

## 2017-12-10 MED ORDER — MIDAZOLAM HCL 10 MG/2ML IJ SOLN
INTRAMUSCULAR | Status: DC | PRN
  Administered 2017-12-10 (×2): 1 mg via INTRAVENOUS

## 2017-12-10 MED ORDER — ACETAMINOPHEN 500 MG PO TABS
1000.0000 mg | ORAL_TABLET | Freq: Four times a day (QID) | ORAL | Status: DC | PRN
  Filled 2017-12-10: qty 2

## 2017-12-10 MED ORDER — FENTANYL CITRATE (PF) 100 MCG/2ML IJ SOLN
INTRAMUSCULAR | Status: DC | PRN
  Administered 2017-12-10 (×2): 12.5 ug via INTRAVENOUS

## 2017-12-10 MED ORDER — SODIUM CHLORIDE 0.9 % IV SOLN
INTRAVENOUS | Status: DC
  Administered 2017-12-10: 12:00:00 via INTRAVENOUS

## 2017-12-10 MED ORDER — HYDROCODONE-ACETAMINOPHEN 5-325 MG PO TABS: 1.0000 | ORAL_TABLET | Freq: Four times a day (QID) | ORAL | Status: DC | PRN

## 2017-12-10 NOTE — Progress Notes (Signed)
Completed patient tapwater enema at 1015. Patient held water for 5 minutes before toileting. Patient passed yellow liquid stool with some formed brown pieces. Medium stool. Hemorrhoids bleeding after toileting; hydrocortisone cream applied.

## 2017-12-10 NOTE — Discharge Instructions (Addendum)
° °  HEMORRHOID BANDING PROCEDURE    FOLLOW-UP CARE   1. The procedure you have had should have been relatively painless since the banding of the area involved does not have nerve endings and there is no pain sensation.  The rubber band cuts off the blood supply to the hemorrhoid and the band may fall off as soon as 48 hours after the banding (the band may occasionally be seen in the toilet bowl following a bowel movement). You may notice a temporary feeling of fullness in the rectum which should respond adequately to plain Tylenol or Motrin. IT IS NORMAL TO FEEL SOME PRESSURE IN THE RECTUM FOR UP TO A FEW DAYS AFTER  2. Following the banding, avoid strenuous exercise that evening and resume full activity the next day.  A sitz bath (soaking in a warm tub) or bidet is soothing, and can be useful for cleansing the area after bowel movements.     3. To avoid constipation, take two tablespoons of natural wheat bran, natural oat bran, flax, Benefiber or any over the counter fiber supplement and increase your water intake to 7-8 glasses daily.    4. Unless you have been prescribed anorectal medication, do not put anything inside your rectum for two weeks: No suppositories, enemas, fingers, etc.  5. Occasionally, you may have more bleeding than usual after the banding procedure.  This is often from the untreated hemorrhoids rather than the treated one.  Dont be concerned if there is a tablespoon or so of blood.  If there is more blood than this, lie flat with your bottom higher than your head and apply an ice pack to the area. If the bleeding does not stop within a half an hour or if you feel faint, call our office at [phone] or go to the emergency room.  6. Problems are not common; however, if there is a substantial amount of bleeding, severe pain, chills, fever or difficulty passing urine (very rare) or other problems, you should call us at [phone] or report to the nearest emergency  room.  7. Do not stay seated continuously for more than 2-3 hours for a day or two after the procedure.  Tighten your buttock muscles 10-15 times every two hours and take 10-15 deep breaths every 1-2 hours.  Do not spend more than a few minutes on the toilet if you cannot empty your bowel; instead re-visit the toilet at a later time.

## 2017-12-10 NOTE — Op Note (Addendum)
[facility] Patient Name: Patricia English Procedure Date : 12/10/2017 MRN: [id number] Attending MD: [name] , MD Date of Birth: 13-Oct-1927 CSN: [id number] Age: 84 Admit Type: Inpatient Procedure:                Flexible Sigmoidoscopy Indications:              Rectal hemorrhage, Hematochezia Providers:                [name], MD, Zenon Mayo, RN, Laverda Sorenson, Technician Referring MD:              Medicines:                Fentanyl 25 micrograms IV, Midazolam 2 mg IV Complications:            No immediate complications. Estimated Blood Loss:     Estimated blood loss: none. Procedure:                Pre-Anesthesia Assessment:                           - Prior to the procedure, a History and Physical                            was performed, and patient medications and                            allergies were reviewed. The patient's tolerance of                            previous anesthesia was also reviewed. The risks                            and benefits of the procedure and the sedation                            options and risks were discussed with the patient.                            All questions were answered, and informed consent                            was obtained. Prior Anticoagulants: The patient has                            taken no previous anticoagulant or antiplatelet                            agents. ASA Grade Assessment: III - A patient with                            severe systemic disease. After reviewing the risks  and benefits, the patient was deemed in                            satisfactory condition to undergo the procedure.                           After obtaining informed consent, the scope was                            passed under direct vision. The PCF-[id number]                            ([id number]) peds colon was introduced through the      anus and advanced to the the descending colon. The                            flexible sigmoidoscopy was accomplished without                            difficulty. The patient tolerated the procedure                            well. The quality of the bowel preparation was good. Scope In: 11:45:01 AM Scope Out: 11:58:30 AM Total Procedure Duration: 0 hours 13 minutes 29 seconds  Findings:      The perianal exam findings include internal hemorrhoids that prolapse       with straining, but require manual replacement into the anal canal       (Grade III) and skin tags.      The digital rectal exam findings include decreased sphincter tone.      External and internal hemorrhoids were found. The hemorrhoids were Grade       III (internal hemorrhoids that prolapse but require manual reduction).       Three bands were successfully placed at the left lateral position, the       right anterior position and the right posterior position. There was no       bleeding during and at the end of the procedure.      Diverticula were found in the sigmoid colon.      The exam was otherwise without abnormality. Impression:               - Internal hemorrhoids that prolapse with                            straining, but require manual replacement into the                            anal canal (Grade III) and perianal skin tags found                            on perianal exam. SOURCE OF BLEEDING                           - Decreased sphincter tone found on digital rectal  exam.                           - External and internal hemorrhoids. Banded.                           - Diverticulosis in the sigmoid colon. Not bleeding                           - The examination was otherwise normal.                           - No specimens collected. Recommendation:           - OK for DC today                           She will come to my office 10/30 for labs and we                             will call results and plans                           Hemorrhoid banding after care instructions put into                            dc summary                           stop rectal cream                           Benefiber qd added to dc instructions                           - Discharge patient to home.                           - Use Benefiber one teaspoon PO daily. Procedure Code(s):        --- Professional ---                           [phone], Sigmoidoscopy, flexible; with band                            ligation(s) (eg, hemorrhoids) Diagnosis Code(s):        --- Professional ---                           K64.4, Residual hemorrhoidal skin tags                           K64.2, Third degree hemorrhoids                           K62.89, Other specified diseases of anus and rectum  K62.5, Hemorrhage of anus and rectum                           K92.1, Melena (includes Hematochezia)                           K57.30, Diverticulosis of large intestine without                            perforation or abscess without bleeding CPT copyright 2018 [employer] Association. All rights reserved. The codes documented in this report are preliminary and upon coder review may  be revised to meet current compliance requirements. [name], MD 12/10/2017 12:13:41 PM This report has been signed electronically. Number of Addenda: 0

## 2017-12-10 NOTE — Progress Notes (Signed)
Subjective:  Patient denies any chest pain or shortness of breath denies abdominal pain. No further rectal bleeding. Hemoglobin has been stable since yesterday patient is schedule for flex sigmoidoscopy today on the thyroid and  A and  Objective:  Vital Signs in the last 24 hours: Temp:  [97.7 F (36.5 C)-98.5 F (36.9 C)] 97.7 F (36.5 C) (10/27 0455) Pulse Rate:  [65-83] 80 (10/27 0813) Resp:  [16-18] 16 (10/27 0455) BP: (104-131)/(47-63) 114/60 (10/27 0813) SpO2:  [98 %-100 %] 99 % (10/27 0455) Weight:  [73.4 kg] 73.4 kg (10/27 0655)  Intake/Output from previous day: 10/26 0701 - 10/27 0700 In: -  Out: 800 [Urine:800] Intake/Output from this shift: No intake/output data recorded.  Physical Exam: Neck: no adenopathy, no carotid bruit, no JVD and supple, symmetrical, trachea midline Lungs: clear to auscultation bilaterally Heart: regular rate and rhythm, S1, S2 normal and 2/6 systolic murmur noted Abdomen: soft, non-tender; bowel sounds normal; no masses,  no organomegaly Extremities: extremities normal, atraumatic, no cyanosis or edema  Lab Results: Recent Labs    12/09/17 0424  12/09/17 1248 12/10/17 0015  WBC 9.1  --   --  10.5  HGB 10.6*   < > 10.8* 10.4*  PLT 279  --   --  282   < > = values in this interval not displayed.   Recent Labs    12/09/17 0424 12/10/17 0015  NA 140 139  K 3.5 3.5  CL 106 108  CO2 23 22  GLUCOSE 101* 123*  BUN 9 7*  CREATININE 0.88 0.98   No results for input(s): TROPONINI in the last 72 hours.  Invalid input(s): CK, MB Hepatic Function Panel Recent Labs    12/08/17 0758  PROT 7.7  ALBUMIN 4.5  AST 18  ALT 9  ALKPHOS 53  BILITOT 0.9   No results for input(s): CHOL in the last 72 hours. No results for input(s): PROTIME in the last 72 hours.  Imaging: Imaging results have been reviewed and No results found.  Cardiac Studies:  Assessment/Plan:  Acute lower chair I bleed Hypertension Type 2 diabetes  mellitus Coronary artery disease Plan Continue present management Schedule for flexible sigmoidoscopy today Check labs in a.m.  LOS: 2 days    Charolette Forward 12/10/2017, 10:01 AM

## 2017-12-10 NOTE — Interval H&P Note (Signed)
History and Physical Interval Note:  12/10/2017 11:37 AM  Patricia English  has presented today for surgery, with the diagnosis of rectal bleeding  The various methods of treatment have been discussed with the patient and family. After consideration of risks, benefits and other options for treatment, the patient has consented to  Procedure(s): FLEXIBLE SIGMOIDOSCOPY (N/A) as a surgical intervention .  The patient's history has been reviewed, patient examined, no change in status, stable for surgery.  I have reviewed the patient's chart and labs.  Questions were answered to the patient's satisfaction.     [name]

## 2017-12-10 NOTE — Progress Notes (Signed)
Per night shift report, patient had 3 loose stools overnight. Will notify MD this morning.

## 2017-12-11 ENCOUNTER — Encounter (HOSPITAL_COMMUNITY): Payer: Self-pay | Admitting: Internal Medicine

## 2017-12-11 LAB — CBC
HCT: 32.8 % — ABNORMAL LOW (ref 36.0–46.0)
Hemoglobin: 10.4 g/dL — ABNORMAL LOW (ref 12.0–15.0)
MCH: 29 pg (ref 26.0–34.0)
MCHC: 31.7 g/dL (ref 30.0–36.0)
MCV: 91.4 fL (ref 80.0–100.0)
NRBC: 0 % (ref 0.0–0.2)
PLATELETS: 297 10*3/uL (ref 150–400)
RBC: 3.59 MIL/uL — AB (ref 3.87–5.11)
RDW: 12.8 % (ref 11.5–15.5)
WBC: 11 10*3/uL — AB (ref 4.0–10.5)

## 2017-12-11 LAB — HEMOGLOBIN A1C
HEMOGLOBIN A1C: 5 % (ref 4.8–5.6)
Mean Plasma Glucose: 96.8 mg/dL

## 2017-12-11 MED ORDER — BENEFIBER PO POWD: ORAL | 0 refills | Status: DC

## 2017-12-11 NOTE — Progress Notes (Signed)
Patient and family member at bedside informed of discharge instructions. Each expressed understanding. IVs removed without complications. Daughter is driving patient home. Volunteered called to walk patient and family down to vehicle. Belongings taken by patient and family at discharge.

## 2017-12-11 NOTE — Discharge Summary (Addendum)
Physician Discharge Summary  Patient ID: Patricia English MRN: [id number] DOB/AGE: 82-May-1929 82 y.o.  Admit date: 12/08/2017 Discharge date: 12/11/2017  Admission Diagnoses: Acute lower GI bleed Hypertension Type 2 DM CAD  Discharge Diagnoses:  Principle problem: Acute Hemorrhage of anus and rectum -K62.5 Active Problems:   Acute Internal and external bleeding hemorrhoids - K64.2   Diverticulosis of large intestine without perforation, abscess or bleeding - K57.30   Hypertension -I10   Type 2 DM - E11.9   CAD - I25.10  Discharged Condition: stable  Hospital Course: 84 year old female with PMH of type 2 DM, CAD and hypertension had bright red blood per rectum. GI consult and felixible sigmoidoscopy revealed third degree hemorrhoids with bleed and diverticulosis without bleeding. Her aspirin was discontinued. She remained stable and was discharged home with follow up by me in 1 week and by GI doctor in 1 week.   Consults: cardiology and GI  Significant Diagnostic Studies: labs: Near normal CBC except Hgb was 10.4 g/dL. Near normal BMET.   Flexible Sigmoidoscopy: Third degree hemorrhoids with bleed and diverticulosis without perforation, abscess and bleeding.  Treatments: Discontinuation of aspirin and starting Benefiber.  Discharge Exam: Blood pressure (!) 105/54, pulse 72, temperature 98.1 F (36.7 C), temperature source Oral, resp. rate 18, height 5\' 9"  (1.753 m), weight 72 kg, SpO2 100 %. General appearance: alert, cooperative and appears stated age. Head: Normocephalic, atraumatic. Eyes: Brown eyes, pink conjunctiva, corneas clear. PERRL, EOM's intact.  Neck: No adenopathy, no carotid bruit, no JVD, supple, symmetrical, trachea midline and thyroid not enlarged. Resp: Clear to auscultation bilaterally. Cardio: Regular rate and rhythm, S1, S2 normal, II/VI systolic murmur, no click, rub or gallop. GI: Soft, non-tender; bowel sounds normal; no organomegaly. Extremities: No  edema, cyanosis or clubbing. Skin: Warm and dry.  Neurologic: Alert and oriented X 3, normal strength and tone. Normal coordination and slow gait with walker.  Disposition: Discharge disposition: 01-Home or Self Care        Allergies as of 12/11/2017      Reactions   Lisinopril    This caused laryngeal edema      Medication List    TAKE these medications   BENEFIBER Powd 1 tablespoon every night   Coenzyme Q10 100 MG Tabs Take 100 mg by mouth daily.   digoxin 0.25 MG tablet Commonly known as:  LANOXIN Take 0.5 tablets (0.125 mg total) by mouth daily.   diltiazem 180 MG 24 hr capsule Commonly known as:  DILACOR XR Take 180 mg by mouth 2 (two) times daily.   feeding supplement (GLUCERNA SHAKE) Liqd Take 237 mLs by mouth 3 (three) times daily with meals.   glipiZIDE 5 MG 24 hr tablet Commonly known as:  GLUCOTROL XL Take 5 mg by mouth every evening.   isosorbide mononitrate 30 MG 24 hr tablet Commonly known as:  IMDUR Take 30 mg by mouth every evening.   metFORMIN 500 MG tablet Commonly known as:  GLUCOPHAGE Take 500 mg by mouth 2 (two) times daily with a meal.   metoprolol tartrate 50 MG tablet Commonly known as:  LOPRESSOR Take 25 mg by mouth 2 (two) times daily.   pravastatin 40 MG tablet Commonly known as:  PRAVACHOL Take 40 mg by mouth daily at 6 PM.   VITAMIN D-1000 MAX ST 1000 units tablet Generic drug:  Cholecalciferol Take 1,000 Units by mouth daily.      Follow-up Information    [name], MD On 12/13/2017.  Specialty:  Gastroenterology Why:  Go to basement lab and get blood count tested and Dr. Clydell Hakim will call results Contact information: [address] [place] [address] [phone]        [name], MD. Schedule an appointment as soon as possible for a visit in 1 week(s).   Specialty:  Cardiology Contact information: [place] [address] [phone]            Signed: Birdie Riddle 12/11/2017, 10:14 AM

## 2017-12-12 ENCOUNTER — Telehealth: Payer: Self-pay | Admitting: Internal Medicine

## 2017-12-12 DIAGNOSIS — K625 Hemorrhage of anus and rectum: Secondary | ICD-10-CM

## 2017-12-12 NOTE — Telephone Encounter (Signed)
Daughter was calling to set follow up to see Dr Carlean Purl after procedure done 10/27 but there's no avail appt with Dr Carlean Purl denied PA

## 2017-12-13 NOTE — Telephone Encounter (Signed)
Patient's daughter notified she is to come for a CBC tomorrow. We will call her with the results when they are back.

## 2017-12-13 NOTE — Telephone Encounter (Signed)
Patient daughter calling again wanting to know if Dr.Gessner wanted to see pt or if he was okay with her seeing an APP. Also pt daughter wanting to know when pt needs to get labs done.

## 2017-12-14 ENCOUNTER — Other Ambulatory Visit (INDEPENDENT_AMBULATORY_CARE_PROVIDER_SITE_OTHER): Payer: Medicare Other

## 2017-12-14 DIAGNOSIS — K625 Hemorrhage of anus and rectum: Secondary | ICD-10-CM | POA: Diagnosis not present

## 2017-12-14 LAB — CBC
HCT: 34 % — ABNORMAL LOW (ref 36.0–46.0)
HEMOGLOBIN: 11.3 g/dL — AB (ref 12.0–15.0)
MCHC: 33.3 g/dL (ref 30.0–36.0)
MCV: 90 fl (ref 78.0–100.0)
PLATELETS: 310 10*3/uL (ref 150.0–400.0)
RBC: 3.78 Mil/uL — ABNORMAL LOW (ref 3.87–5.11)
RDW: 13.5 % (ref 11.5–15.5)
WBC: 11 10*3/uL — ABNORMAL HIGH (ref 4.0–10.5)

## 2017-12-15 ENCOUNTER — Telehealth: Payer: Self-pay | Admitting: Internal Medicine

## 2017-12-15 NOTE — Progress Notes (Signed)
Let patient/daughter know blood count is stable and almost normal at 11- same as when she left the hospital  We need to give time for the hemorrhoid banding to heal to see full results  I would like to see her back (me not APP in case needs more banding) in December next available.  She may experience slight bleeding - if it is perceived as severe bleeding then call back or if so severe it warrants ED eval do that but advise may still see some bleeding but hopefully not as much

## 2017-12-15 NOTE — Telephone Encounter (Signed)
See results of CBC for additional details.

## 2017-12-15 NOTE — Telephone Encounter (Signed)
Daughter would like lab results from yesterday

## 2018-01-25 ENCOUNTER — Other Ambulatory Visit (INDEPENDENT_AMBULATORY_CARE_PROVIDER_SITE_OTHER): Payer: Medicare Other

## 2018-01-25 ENCOUNTER — Ambulatory Visit (INDEPENDENT_AMBULATORY_CARE_PROVIDER_SITE_OTHER): Payer: Medicare Other | Admitting: Internal Medicine

## 2018-01-25 ENCOUNTER — Encounter: Payer: Self-pay | Admitting: Internal Medicine

## 2018-01-25 VITALS — BP 92/70 | HR 62 | Ht 69.0 in | Wt 152.1 lb

## 2018-01-25 DIAGNOSIS — R197 Diarrhea, unspecified: Secondary | ICD-10-CM

## 2018-01-25 DIAGNOSIS — K644 Residual hemorrhoidal skin tags: Secondary | ICD-10-CM | POA: Diagnosis not present

## 2018-01-25 DIAGNOSIS — D62 Acute posthemorrhagic anemia: Secondary | ICD-10-CM

## 2018-01-25 DIAGNOSIS — K648 Other hemorrhoids: Secondary | ICD-10-CM

## 2018-01-25 LAB — CBC WITH DIFFERENTIAL/PLATELET
BASOS ABS: 0.1 10*3/uL (ref 0.0–0.1)
Basophils Relative: 0.4 % (ref 0.0–3.0)
Eosinophils Absolute: 0.1 10*3/uL (ref 0.0–0.7)
Eosinophils Relative: 0.6 % (ref 0.0–5.0)
HCT: 38.3 % (ref 36.0–46.0)
HEMOGLOBIN: 12.7 g/dL (ref 12.0–15.0)
LYMPHS PCT: 21.4 % (ref 12.0–46.0)
Lymphs Abs: 3 10*3/uL (ref 0.7–4.0)
MCHC: 33.2 g/dL (ref 30.0–36.0)
MCV: 88.1 fl (ref 78.0–100.0)
Monocytes Absolute: 1.4 10*3/uL — ABNORMAL HIGH (ref 0.1–1.0)
Monocytes Relative: 9.9 % (ref 3.0–12.0)
NEUTROS ABS: 9.6 10*3/uL — AB (ref 1.4–7.7)
NEUTROS PCT: 67.7 % (ref 43.0–77.0)
Platelets: 390 10*3/uL (ref 150.0–400.0)
RBC: 4.34 Mil/uL (ref 3.87–5.11)
RDW: 12.9 % (ref 11.5–15.5)
WBC: 14.1 10*3/uL — ABNORMAL HIGH (ref 4.0–10.5)

## 2018-01-25 NOTE — Patient Instructions (Signed)
If you are age 82 or older, your body mass index should be between 23-30. Your Body mass index is 22.46 kg/m. If this is out of the aforementioned range listed, please consider follow up with your Primary Care Provider.  If you are age 57 or younger, your body mass index should be between 19-25. Your Body mass index is 22.46 kg/m. If this is out of the aformentioned range listed, please consider follow up with your Primary Care Provider.   Start taking 1/2 a tablet of metformin twice a day.   Your provider has requested that you go to the basement level for lab work before leaving today. Press "B" on the elevator. The lab is located at the first door on the left as you exit the elevator.  It was a pleasure to see you today!  Dr. Carlean Purl

## 2018-01-25 NOTE — Assessment & Plan Note (Signed)
Bleeding stopped after banding

## 2018-01-25 NOTE — Progress Notes (Signed)
Patricia English 84 y.o. 1927-06-28 [id number]  Assessment & Plan:   Encounter Diagnoses  Name Primary?  . Watery diarrhea Yes  . Internal and external bleeding hemorrhoids   . Acute blood loss anemia     It sounds like the diarrhea is related to her Metformin but she was hospitalized so I think C. difficile is possible.  Check CBC, and check  stool C. difficile pCR 1/2 metformin dosing again, 250 mg twice daily and contact Dr. Doylene Canard about this.  Await labs and response to change in medication.  If diarrhea persists will need to figure out additional testing versus symptomatic treatment perhaps.  I appreciate the opportunity to care for this patient. CC: Patricia Dials, MD  Subjective:   Chief Complaint: Diarrhea HPI Patricia English is here with her daughter, she had bleeding when she was hospitalized we thought it was due to prolapsed internal and external hemorrhoids so I did a sigmoidoscopy and banding procedure and there is been no rectal bleeding since.  Is now having watery stools which she thinks may be related to increase in the dosing of her metformin.  She used to be on to 50 mg or half a tablet twice a day and this was increased to 500 mg twice daily at discharge from the hospital.  She has urgent stools sometimes with some incontinence and it can be nocturnal.  No fever no significant abdominal pain.  The patient stopped Glucerna but that did not really change things.  I have reviewed recent hospitalization records.  She was due to see Dr. Doylene Canard this week but is unable to because he had to reschedule so that is happening later this month.  No more problems with dysphagia which were related to angioedema Allergies  Allergen Reactions  . Lisinopril     This caused laryngeal edema   Current Meds  Medication Sig  . Cholecalciferol (VITAMIN D-1000 MAX ST) 1000 units tablet Take 1,000 Units by mouth daily.  . Coenzyme Q10 100 MG TABS Take 100 mg by mouth daily.  . digoxin  (LANOXIN) 0.25 MG tablet Take 0.5 tablets (0.125 mg total) by mouth daily.  Marland Kitchen diltiazem (DILACOR XR) 180 MG 24 hr capsule Take 180 mg by mouth 2 (two) times daily.  . feeding supplement, GLUCERNA SHAKE, (GLUCERNA SHAKE) LIQD Take 237 mLs by mouth 3 (three) times daily with meals.  Marland Kitchen glipiZIDE (GLUCOTROL XL) 5 MG 24 hr tablet Take 5 mg by mouth every evening.   . isosorbide mononitrate (IMDUR) 30 MG 24 hr tablet Take 30 mg by mouth every evening.   . metFORMIN (GLUCOPHAGE) 500 MG tablet Take 500 mg by mouth 2 (two) times daily with a meal.   . metoprolol (LOPRESSOR) 50 MG tablet Take 25 mg by mouth 2 (two) times daily.   . pravastatin (PRAVACHOL) 40 MG tablet Take 40 mg by mouth daily at 6 PM.  . [DISCONTINUED] Wheat Dextrin (BENEFIBER) POWD 1 tablespoon every night   Past Medical History:  Diagnosis Date  . Coronary artery disease   . Diabetes mellitus without complication ([place])   . GI bleeding 11/2017  . Hypertension   . Internal hemorrhoids    Past Surgical History:  Procedure Laterality Date  . CATARACT EXTRACTION    . ESOPHAGOGASTRODUODENOSCOPY (EGD) WITH PROPOFOL N/A 08/13/2017   Procedure: ESOPHAGOGASTRODUODENOSCOPY (EGD) WITH PROPOFOL;  Surgeon: [name], MD;  Location: [facility];  Service: Endoscopy;  Laterality: N/A;  . FLEXIBLE SIGMOIDOSCOPY N/A 12/10/2017   Procedure: FLEXIBLE SIGMOIDOSCOPY;  Surgeon: [name], MD;  Location[facility] ENDOSCOPY;  Service: Endoscopy;  Laterality: N/A;  . HEMORRHOID BANDING  12/10/2017   Procedure: HEMORRHOID BANDING;  Surgeon: [name], MD;  Location: [facility] ENDOSCOPY;  Service: Endoscopy;;   Social History   Social History Narrative   Patient is widowed   Denies alcohol tobacco use   family history is not on file.   Review of Systems As per HPI Objective:   Physical Exam BP 92/70   Pulse 62   Ht 5\' 9"  (1.753 m)   Wt 152 lb 2 oz (69 kg)   BMI 22.46 kg/m  elderly black woman in no acute distress  abdominal exam is  soft nontender without organomegaly or mass Is alert and oriented and has an appropriate mood and affect

## 2018-01-26 ENCOUNTER — Other Ambulatory Visit: Payer: Medicare Other

## 2018-01-26 DIAGNOSIS — K648 Other hemorrhoids: Secondary | ICD-10-CM

## 2018-01-26 DIAGNOSIS — R197 Diarrhea, unspecified: Secondary | ICD-10-CM

## 2018-01-26 DIAGNOSIS — K644 Residual hemorrhoidal skin tags: Secondary | ICD-10-CM

## 2018-01-26 DIAGNOSIS — D62 Acute posthemorrhagic anemia: Secondary | ICD-10-CM

## 2018-01-26 NOTE — Progress Notes (Signed)
CBC normal Good news remind her to turn in C diff PCR specimen please

## 2018-01-29 LAB — CLOSTRIDIUM DIFFICILE TOXIN B, QUALITATIVE, REAL-TIME PCR: CDIFFPCR: NOT DETECTED

## 2018-01-31 NOTE — Progress Notes (Signed)
Let her or daughter know that the diarrhea is not from infection  We reduced metformin - hope that helped and she should f/u PCP Dr. Doylene Canard

## 2018-01-31 NOTE — Progress Notes (Signed)
See prior result note  She does not have C diff  F/u PCP

## 2020-10-28 ENCOUNTER — Inpatient Hospital Stay (HOSPITAL_COMMUNITY): Payer: Medicare Other

## 2020-10-28 ENCOUNTER — Inpatient Hospital Stay (HOSPITAL_COMMUNITY)
Admission: AD | Admit: 2020-10-28 | Discharge: 2020-11-01 | DRG: 378 | Disposition: A | Discharge status: Home / Self Care | Payer: Medicare Other | Source: Ambulatory Visit | Attending: Cardiovascular Disease | Admitting: Cardiovascular Disease

## 2020-10-28 DIAGNOSIS — Z79899 Other long term (current) drug therapy: Secondary | ICD-10-CM | POA: Diagnosis not present

## 2020-10-28 DIAGNOSIS — Z7984 Long term (current) use of oral hypoglycemic drugs: Secondary | ICD-10-CM | POA: Diagnosis not present

## 2020-10-28 DIAGNOSIS — K635 Polyp of colon: Secondary | ICD-10-CM | POA: Diagnosis not present

## 2020-10-28 DIAGNOSIS — K623 Rectal prolapse: Secondary | ICD-10-CM | POA: Diagnosis not present

## 2020-10-28 DIAGNOSIS — K921 Melena: Secondary | ICD-10-CM | POA: Diagnosis not present

## 2020-10-28 DIAGNOSIS — Z888 Allergy status to other drugs, medicaments and biological substances status: Secondary | ICD-10-CM | POA: Diagnosis not present

## 2020-10-28 DIAGNOSIS — R531 Weakness: Secondary | ICD-10-CM

## 2020-10-28 DIAGNOSIS — D649 Anemia, unspecified: Secondary | ICD-10-CM | POA: Diagnosis not present

## 2020-10-28 DIAGNOSIS — K922 Gastrointestinal hemorrhage, unspecified: Secondary | ICD-10-CM | POA: Diagnosis not present

## 2020-10-28 DIAGNOSIS — I482 Chronic atrial fibrillation, unspecified: Secondary | ICD-10-CM | POA: Diagnosis not present

## 2020-10-28 DIAGNOSIS — K626 Ulcer of anus and rectum: Secondary | ICD-10-CM | POA: Diagnosis present

## 2020-10-28 DIAGNOSIS — D62 Acute posthemorrhagic anemia: Secondary | ICD-10-CM | POA: Diagnosis present

## 2020-10-28 DIAGNOSIS — E1122 Type 2 diabetes mellitus with diabetic chronic kidney disease: Secondary | ICD-10-CM | POA: Diagnosis present

## 2020-10-28 DIAGNOSIS — I129 Hypertensive chronic kidney disease with stage 1 through stage 4 chronic kidney disease, or unspecified chronic kidney disease: Secondary | ICD-10-CM | POA: Diagnosis present

## 2020-10-28 DIAGNOSIS — K5730 Diverticulosis of large intestine without perforation or abscess without bleeding: Secondary | ICD-10-CM | POA: Diagnosis present

## 2020-10-28 DIAGNOSIS — E876 Hypokalemia: Secondary | ICD-10-CM | POA: Diagnosis not present

## 2020-10-28 DIAGNOSIS — E119 Type 2 diabetes mellitus without complications: Secondary | ICD-10-CM

## 2020-10-28 DIAGNOSIS — K648 Other hemorrhoids: Secondary | ICD-10-CM | POA: Diagnosis not present

## 2020-10-28 DIAGNOSIS — Z20822 Contact with and (suspected) exposure to covid-19: Secondary | ICD-10-CM | POA: Diagnosis not present

## 2020-10-28 DIAGNOSIS — N182 Chronic kidney disease, stage 2 (mild): Secondary | ICD-10-CM | POA: Diagnosis present

## 2020-10-28 DIAGNOSIS — I2510 Atherosclerotic heart disease of native coronary artery without angina pectoris: Secondary | ICD-10-CM | POA: Diagnosis present

## 2020-10-28 LAB — COMPREHENSIVE METABOLIC PANEL
ALT: 10 U/L (ref 0–44)
AST: 15 U/L (ref 15–41)
Albumin: 3.5 g/dL (ref 3.5–5.0)
Alkaline Phosphatase: 66 U/L (ref 38–126)
Anion gap: 10 (ref 5–15)
BUN: 18 mg/dL (ref 8–23)
CO2: 24 mmol/L (ref 22–32)
Calcium: 9 mg/dL (ref 8.9–10.3)
Chloride: 107 mmol/L (ref 98–111)
Creatinine, Ser: 1.43 mg/dL — ABNORMAL HIGH (ref 0.44–1.00)
GFR, Estimated: 35 mL/min — ABNORMAL LOW (ref >60)
Glucose, Bld: 69 mg/dL — ABNORMAL LOW (ref 70–99)
Potassium: 3.4 mmol/L — ABNORMAL LOW (ref 3.5–5.1)
Sodium: 141 mmol/L (ref 135–145)
Total Bilirubin: 1.1 mg/dL (ref 0.3–1.2)
Total Protein: 7 g/dL (ref 6.5–8.1)

## 2020-10-28 LAB — GLUCOSE, CAPILLARY: Glucose-Capillary: 74 mg/dL (ref 70–99)

## 2020-10-28 MED ORDER — DIGOXIN 125 MCG PO TABS: 0.1250 mg | ORAL_TABLET | Freq: Every day | ORAL | Status: DC

## 2020-10-28 MED ORDER — PANTOPRAZOLE SODIUM 40 MG IV SOLR
40.0000 mg | INTRAVENOUS | Status: DC
  Administered 2020-10-28: 40 mg via INTRAVENOUS
  Filled 2020-10-28: qty 40

## 2020-10-28 MED ORDER — PRAVASTATIN SODIUM 40 MG PO TABS
40.0000 mg | ORAL_TABLET | Freq: Every day | ORAL | Status: DC
  Administered 2020-10-29 – 2020-10-31 (×3): 40 mg via ORAL
  Filled 2020-10-28 (×4): qty 1

## 2020-10-28 MED ORDER — SODIUM CHLORIDE 0.9 % IV SOLN: 250.0000 mL | INTRAVENOUS | Status: DC | PRN

## 2020-10-28 MED ORDER — ACETAMINOPHEN 325 MG PO TABS: 650.0000 mg | ORAL_TABLET | ORAL | Status: DC | PRN

## 2020-10-28 MED ORDER — SODIUM CHLORIDE 0.9% FLUSH
3.0000 mL | Freq: Two times a day (BID) | INTRAVENOUS | Status: DC
  Administered 2020-10-28 – 2020-11-01 (×8): 3 mL via INTRAVENOUS

## 2020-10-28 MED ORDER — COENZYME Q10 100 MG PO TABS: 100.0000 mg | ORAL_TABLET | Freq: Every day | ORAL | Status: DC

## 2020-10-28 MED ORDER — VITAMIN D 25 MCG (1000 UNIT) PO TABS
1000.0000 [IU] | ORAL_TABLET | Freq: Every day | ORAL | Status: DC
  Administered 2020-10-29 – 2020-11-01 (×4): 1000 [IU] via ORAL
  Filled 2020-10-28 (×4): qty 1

## 2020-10-28 MED ORDER — SODIUM CHLORIDE 0.9% FLUSH: 3.0000 mL | INTRAVENOUS | Status: DC | PRN

## 2020-10-28 MED ORDER — ONDANSETRON HCL 4 MG/2ML IJ SOLN: 4.0000 mg | Freq: Four times a day (QID) | INTRAMUSCULAR | Status: DC | PRN

## 2020-10-28 MED ORDER — GLIPIZIDE ER 5 MG PO TB24
5.0000 mg | ORAL_TABLET | Freq: Every evening | ORAL | Status: DC
  Filled 2020-10-28: qty 1

## 2020-10-28 MED ORDER — SODIUM CHLORIDE 0.9 % IV SOLN: INTRAVENOUS | Status: DC

## 2020-10-28 NOTE — Progress Notes (Signed)
Dr. Doylene Canard did rectal exams and the hemoccult result was  positive.

## 2020-10-28 NOTE — Progress Notes (Signed)
PHARMACIST - PHYSICIAN ORDER COMMUNICATION  CONCERNING: P&T Medication Policy on Herbal Medications  DESCRIPTION:  This patient's order for:  Co-enzyme Q10 has been noted.  This product(s) is classified as an "herbal" or natural product. Due to a lack of definitive safety studies or FDA approval, nonstandard manufacturing practices, plus the potential risk of unknown drug-drug interactions while on inpatient medications, the Pharmacy and Therapeutics Committee does not permit the use of "herbal" or natural products of this type within [facility].   ACTION TAKEN: The pharmacy department is unable to verify this order at this time and your patient has been informed of this safety policy. Please reevaluate patient's clinical condition at discharge and address if the herbal or natural product(s) should be resumed at that time.   

## 2020-10-28 NOTE — H&P (Signed)
Referring Physician: Birdie Riddle MD  Patricia English is an 87 y.o. female.                       Chief Complaint: Dark stools per rectum with dizziness  HPI: 87 years old black female with PMH of HTN, type 2 DM. CAD and anemia with [place] GI bleeding has 1 day history of dark stools and dizziness. She denies chest pain or palpitations.  Labs and x-rays are pending.  Past Medical History:  Diagnosis Date   Coronary artery disease    Diabetes mellitus without complication ([place])    GI bleeding 11/2017   Hypertension    Internal hemorrhoids       Past Surgical History:  Procedure Laterality Date   CATARACT EXTRACTION     ESOPHAGOGASTRODUODENOSCOPY (EGD) WITH PROPOFOL N/A 08/13/2017   Procedure: ESOPHAGOGASTRODUODENOSCOPY (EGD) WITH PROPOFOL;  Surgeon: [name], MD;  Location: [facility] ENDOSCOPY;  Service: Endoscopy;  Laterality: N/A;   FLEXIBLE SIGMOIDOSCOPY N/A 12/10/2017   Procedure: FLEXIBLE SIGMOIDOSCOPY;  Surgeon: [name], MD;  Location: [facility] ENDOSCOPY;  Service: Endoscopy;  Laterality: N/A;   HEMORRHOID BANDING  12/10/2017   Procedure: HEMORRHOID BANDING;  Surgeon: [name], MD;  Location: [facility] ENDOSCOPY;  Service: Endoscopy;;    No family history on file. Social History:  reports that she has never smoked. She has never used smokeless tobacco. She reports that she does not drink alcohol and does not use drugs.  Allergies:  Allergies  Allergen Reactions   Lisinopril     This caused laryngeal edema    Medications Prior to Admission  Medication Sig Dispense Refill   Cholecalciferol (VITAMIN D-1000 MAX ST) 1000 units tablet Take 1,000 Units by mouth daily.     Coenzyme Q10 100 MG TABS Take 100 mg by mouth daily.     digoxin (LANOXIN) 0.25 MG tablet Take 0.5 tablets (0.125 mg total) by mouth daily.     diltiazem (DILACOR XR) 180 MG 24 hr capsule Take 180 mg by mouth 2 (two) times daily.     glipiZIDE (GLUCOTROL XL) 5 MG 24 hr tablet Take 5 mg by mouth every  evening.      isosorbide mononitrate (IMDUR) 30 MG 24 hr tablet Take 30 mg by mouth every evening.      metFORMIN (GLUCOPHAGE) 500 MG tablet Take 500 mg by mouth 2 (two) times daily with a meal. 1/2 tablet twice a day.     metoprolol (LOPRESSOR) 50 MG tablet Take 25 mg by mouth 2 (two) times daily.      pravastatin (PRAVACHOL) 40 MG tablet Take 40 mg by mouth daily at 6 PM.  3    Results for orders placed or performed during the hospital encounter of 10/28/20 (from the past 48 hour(s))  Glucose, capillary     Status: None   Collection Time: 10/28/20  3:50 PM  Result Value Ref Range   Glucose-Capillary 74 70 - 99 mg/dL    Comment: Glucose reference range applies only to samples taken after fasting for at least 8 hours.   No results found.  Review Of Systems Constitutional: No fever, chills, weight loss or gain. Eyes: No vision change, wears glasses. No discharge or pain. Ears: No hearing loss, No tinnitus. Respiratory: No asthma, COPD, pneumonias. No shortness of breath. No hemoptysis. Cardiovascular: Positive chest pain, palpitation, leg edema. Gastrointestinal: No nausea, vomiting, diarrhea, constipation. Positive GI bleed. No hepatitis. Genitourinary: No dysuria, hematuria, kidney stone.  No incontinance. Neurological: Positive headache, no stroke, seizures.  Psychiatry: No psych facility admission for anxiety, depression, suicide. No detox. Skin: No rash. Musculoskeletal: Positive joint pain, no fibromyalgia. No neck pain, back pain. Lymphadenopathy: No lymphadenopathy. Hematology: H/O anemia, No easy bruising.   Blood pressure (!) 149/71, pulse 92, temperature 98 F (36.7 C), temperature source Oral, resp. rate 19, SpO2 100 %. There is no height or weight on file to calculate BMI. General appearance: alert, cooperative, appears stated age and mild distress Head: Normocephalic, atraumatic. Eyes: Brown eyes, pink conjunctiva, corneas clear. PERRL, EOM's intact. Neck: No  adenopathy, no carotid bruit, no JVD, supple, symmetrical, trachea midline and thyroid not enlarged. Resp: Clear to auscultation bilaterally. Cardio: Regular rate and rhythm, S1, S2 normal, II/VI systolic murmur, no click, rub or gallop GI: Soft, non-tender; bowel sounds normal; no organomegaly. Extremities: No edema, cyanosis or clubbing. Skin: Warm and dry.  Neurologic: Alert and oriented X 3, normal strength. Normal coordination and slow gait.  Assessment/Plan Acute upper GI bleed Dizziness from above HTN Type 2 DM CAD  Clear liquid diet. IV pantoprazole. IV saline. Hold BP medications GI consult  Time spent: Review of old records, Lab, x-rays, EKG, other cardiac tests, examination, discussion with patient/Family/Nurse over 70 minutes.  Birdie Riddle, MD  10/28/2020, 5:24 PM

## 2020-10-29 ENCOUNTER — Encounter (HOSPITAL_COMMUNITY): Payer: Self-pay | Admitting: Cardiovascular Disease

## 2020-10-29 ENCOUNTER — Other Ambulatory Visit: Payer: Self-pay

## 2020-10-29 ENCOUNTER — Inpatient Hospital Stay (HOSPITAL_COMMUNITY): Payer: Medicare Other

## 2020-10-29 DIAGNOSIS — K922 Gastrointestinal hemorrhage, unspecified: Secondary | ICD-10-CM

## 2020-10-29 LAB — CBC WITH DIFFERENTIAL/PLATELET
Abs Immature Granulocytes: 0.02 10*3/uL (ref 0.00–0.07)
Basophils Absolute: 0 10*3/uL (ref 0.0–0.1)
Basophils Relative: 1 %
Eosinophils Absolute: 0.2 10*3/uL (ref 0.0–0.5)
Eosinophils Relative: 2 %
HCT: 29.2 % — ABNORMAL LOW (ref 36.0–46.0)
Hemoglobin: 9.4 g/dL — ABNORMAL LOW (ref 12.0–15.0)
Immature Granulocytes: 0 %
Lymphocytes Relative: 29 %
Lymphs Abs: 2.2 10*3/uL (ref 0.7–4.0)
MCH: 29.3 pg (ref 26.0–34.0)
MCHC: 32.2 g/dL (ref 30.0–36.0)
MCV: 91 fL (ref 80.0–100.0)
Monocytes Absolute: 0.8 10*3/uL (ref 0.1–1.0)
Monocytes Relative: 11 %
Neutro Abs: 4.4 10*3/uL (ref 1.7–7.7)
Neutrophils Relative %: 57 %
Platelets: 222 10*3/uL (ref 150–400)
RBC: 3.21 MIL/uL — ABNORMAL LOW (ref 3.87–5.11)
RDW: 13.1 % (ref 11.5–15.5)
WBC: 7.7 10*3/uL (ref 4.0–10.5)
nRBC: 0 % (ref 0.0–0.2)

## 2020-10-29 LAB — CBC
HCT: 32.1 % — ABNORMAL LOW (ref 36.0–46.0)
HCT: 32 % — ABNORMAL LOW (ref 36.0–46.0)
Hemoglobin: 10.4 g/dL — ABNORMAL LOW (ref 12.0–15.0)
Hemoglobin: 10.2 g/dL — ABNORMAL LOW (ref 12.0–15.0)
MCH: 29.2 pg (ref 26.0–34.0)
MCH: 29 pg (ref 26.0–34.0)
MCHC: 32.4 g/dL (ref 30.0–36.0)
MCHC: 31.9 g/dL (ref 30.0–36.0)
MCV: 90.2 fL (ref 80.0–100.0)
MCV: 90.9 fL (ref 80.0–100.0)
Platelets: 236 10*3/uL (ref 150–400)
Platelets: 234 10*3/uL (ref 150–400)
RBC: 3.56 MIL/uL — ABNORMAL LOW (ref 3.87–5.11)
RBC: 3.52 MIL/uL — ABNORMAL LOW (ref 3.87–5.11)
RDW: 13 % (ref 11.5–15.5)
RDW: 13.1 % (ref 11.5–15.5)
WBC: 7.9 10*3/uL (ref 4.0–10.5)
WBC: 8.8 10*3/uL (ref 4.0–10.5)
nRBC: 0 % (ref 0.0–0.2)
nRBC: 0 % (ref 0.0–0.2)

## 2020-10-29 LAB — BASIC METABOLIC PANEL
Anion gap: 8 (ref 5–15)
BUN: 15 mg/dL (ref 8–23)
CO2: 24 mmol/L (ref 22–32)
Calcium: 8.6 mg/dL — ABNORMAL LOW (ref 8.9–10.3)
Chloride: 110 mmol/L (ref 98–111)
Creatinine, Ser: 1.2 mg/dL — ABNORMAL HIGH (ref 0.44–1.00)
GFR, Estimated: 43 mL/min — ABNORMAL LOW (ref >60)
Glucose, Bld: 79 mg/dL (ref 70–99)
Potassium: 3.4 mmol/L — ABNORMAL LOW (ref 3.5–5.1)
Sodium: 142 mmol/L (ref 135–145)

## 2020-10-29 LAB — ECHOCARDIOGRAM COMPLETE
Area-P 1/2: 3.61 cm2
S' Lateral: 2.5 cm
Weight: 2419.77 oz

## 2020-10-29 LAB — SARS CORONAVIRUS 2 (TAT 6-24 HRS): SARS Coronavirus 2: NEGATIVE

## 2020-10-29 MED ORDER — PANTOPRAZOLE SODIUM 40 MG IV SOLR
40.0000 mg | Freq: Two times a day (BID) | INTRAVENOUS | Status: DC
  Administered 2020-10-29 – 2020-11-01 (×6): 40 mg via INTRAVENOUS
  Filled 2020-10-29 (×6): qty 40

## 2020-10-29 MED ORDER — POTASSIUM CHLORIDE IN NACL 40-0.9 MEQ/L-% IV SOLN
INTRAVENOUS | Status: DC
  Filled 2020-10-29 (×5): qty 1000

## 2020-10-29 NOTE — Progress Notes (Signed)
Ref: [name], MD   Subjective:  Awake. Significant drop in Hgb. Mild tachycardia with walking. She denies abdominal pain. Discussed case with GI. Will make her NPO. Patient agrees to GI evaluation. Rectal exam by me on admission revealed heme positive stool. Glipizide held for now. Echocardiogram showed normal LV systolic function and mild MR and TR.  Objective:  Vital Signs in the last 24 hours: Temp:  [97.7 F (36.5 C)-98.3 F (36.8 C)] 98.3 F (36.8 C) (09/15 0803) Pulse Rate:  [76-102] 102 (09/15 0803) Cardiac Rhythm: Atrial fibrillation (09/15 0700) Resp:  [16-19] 18 (09/15 0803) BP: (113-149)/(63-86) 133/86 (09/15 0803) SpO2:  [100 %] 100 % (09/15 0803) Weight:  [68.6 kg] 68.6 kg (09/15 0317)  Physical Exam: BP Readings from Last 1 Encounters:  10/29/20 133/86     Wt Readings from Last 1 Encounters:  10/29/20 68.6 kg    Weight change:  Body mass index is 22.33 kg/m. HEENT: [place]/AT, Eyes-Brown, Conjunctiva-Pale pink, Sclera-Non-icteric Neck: No JVD, No bruit, Trachea midline. Lungs:  Clear, Bilateral. Cardiac:  Regular rhythm, normal S1 and S2, no S3. II/VI systolic murmur. Abdomen:  Soft, non-tender. BS present. Extremities:  No edema present. No cyanosis. No clubbing. CNS: AxOx3, Cranial nerves grossly intact, moves all 4 extremities.  Skin: Warm and dry.   Intake/Output from previous day: 09/14 0701 - 09/15 0700 In: 1083.9 [I.V.:1083.9] Out: -     Lab Results: BMET    Component Value Date/Time   NA 142 10/29/2020 0425   NA 141 10/28/2020 1816   NA 139 12/10/2017 0015   K 3.4 (L) 10/29/2020 0425   K 3.4 (L) 10/28/2020 1816   K 3.5 12/10/2017 0015   CL 110 10/29/2020 0425   CL 107 10/28/2020 1816   CL 108 12/10/2017 0015   CO2 24 10/29/2020 0425   CO2 24 10/28/2020 1816   CO2 22 12/10/2017 0015   GLUCOSE 79 10/29/2020 0425   GLUCOSE 69 (L) 10/28/2020 1816   GLUCOSE 123 (H) 12/10/2017 0015   BUN 15 10/29/2020 0425   BUN 18 10/28/2020 1816    BUN 7 (L) 12/10/2017 0015   CREATININE 1.20 (H) 10/29/2020 0425   CREATININE 1.43 (H) 10/28/2020 1816   CREATININE 0.98 12/10/2017 0015   CALCIUM 8.6 (L) 10/29/2020 0425   CALCIUM 9.0 10/28/2020 1816   CALCIUM 9.1 12/10/2017 0015   GFRNONAA 43 (L) 10/29/2020 0425   GFRNONAA 35 (L) 10/28/2020 1816   GFRNONAA 50 (L) 12/10/2017 0015   GFRNONAA 57 (L) 12/09/2017 0424   GFRNONAA 49 (L) 12/08/2017 0758   GFRAA 58 (L) 12/10/2017 0015   GFRAA >60 12/09/2017 0424   GFRAA 57 (L) 12/08/2017 0758   CBC    Component Value Date/Time   WBC 7.7 10/29/2020 0425   RBC 3.21 (L) 10/29/2020 0425   HGB 9.4 (L) 10/29/2020 0425   HCT 29.2 (L) 10/29/2020 0425   PLT 222 10/29/2020 0425   MCV 91.0 10/29/2020 0425   MCH 29.3 10/29/2020 0425   MCHC 32.2 10/29/2020 0425   RDW 13.1 10/29/2020 0425   LYMPHSABS 2.2 10/29/2020 0425   MONOABS 0.8 10/29/2020 0425   EOSABS 0.2 10/29/2020 0425   BASOSABS 0.0 10/29/2020 0425   HEPATIC Function Panel Recent Labs    10/28/20 1816  PROT 7.0   HEMOGLOBIN A1C No components found for: HGA1C,  MPG CARDIAC ENZYMES Lab Results  Component Value Date   TROPONINI <0.30 11/22/2013   TROPONINI <0.30 10/13/2012   BNP No results for input(s):  PROBNP in the last 8760 hours. TSH No results for input(s): TSH in the last 8760 hours. CHOLESTEROL No results for input(s): CHOL in the last 8760 hours.  Scheduled Meds:  cholecalciferol  1,000 Units Oral Daily   pantoprazole (PROTONIX) IV  40 mg Intravenous Q24H   pravastatin  40 mg Oral q1800   sodium chloride flush  3 mL Intravenous Q12H   Continuous Infusions:  sodium chloride     0.9 % NaCl with KCl 40 mEq / L     PRN Meds:.sodium chloride, acetaminophen, ondansetron (ZOFRAN) IV, sodium chloride flush  Assessment/Plan: Acute upper GI bleed Dizziness from above HTN Type 2 DM CAD MR  TR  Plan: GI consult. NPO Monitor H/H. Continue Protonix. Hold hypoglycemic agent.   LOS: 1 day   Time spent  including chart review, lab review, examination, discussion with patient/Nurse/GI : 30 min   [name]  MD  10/29/2020, 10:21 AM

## 2020-10-29 NOTE — Progress Notes (Signed)
  Mobility Specialist Criteria Algorithm Info.  Mobility Team: [facility] elevated:Self regulated Activity: Ambulated in hall; Dangled on edge of bed Range of motion: Active; All extremities Level of assistance: Modified independent, requires aide device or extra time Assistive device: Other (Comment) (IV Pole) Minutes sitting in chair:  Minutes stood: 5 minutes Minutes ambulated: 5 minutes Distance ambulated (ft): 200 ft Mobility response: Tolerated well Bed Position: Semi-fowlers  Patient is independent with ADL's and iADL's, agreed to participate in mobility. Got to EOB and stood independently, ambulated in hallway 200 feet with slow steady gait. HR elevated throughout ambulation peaking at 130 bpm, decreases with rest. Tolerated ambulation well without complaint or incident and is asx with. Was left lying supine in bed with all needs met.  HR pre: 90 HR during: 120-130 HR post: 100-105   10/29/2020 2:55 PM

## 2020-10-29 NOTE — Progress Notes (Signed)
  Echocardiogram 2D Echocardiogram has been performed.  Patricia English 10/29/2020, 9:57 AM

## 2020-10-29 NOTE — H&P (View-Only) (Signed)
Referring Provider:  [facility]         Primary Care Physician:  [name], MD Primary Gastroenterologist:   [name], MD  Reason for Consultation:    melena                ASSESSMENT / PLAN   # 87 yo female dark, FOBT+ stool and anemia. Stool dark for months but two days ago she had several episodes of dark red blood in stool. Her underpants have dark stains. She doesn't take oral iron the best I can tell.  Presumably she is having upper GI bleeding but the dark red blood in stool a couple of days ago is concerning. Furthermore her BUN is normal ( though it can be is this is more of a chronic upper bleed) --It is reasonable to start with an EGD. The risks and benefits of EGD with possible biopsies were discussed with the patient who agrees to proceed.  --Clear liquids today, NPO after MN.   --Increasing PPI to BID  # Bowel changes. Several month history of stools being on loose side and a little more frequent which could be secondary to GI bleeding. Of note we saw her in December 2019 for diarrhea. There was some concern that Metformin may be responsible. She is still on metformin.  --Further evaluation pending clinical course  # Hgb 9.4 ( baseline unknown other than it was 12.7 in 2019) --Trend hgb, transfuse if needed.   HISTORY OF PRESENT ILLNESS                                                                                                                         Chief Complaint: dark stools  Patricia English is a 87 y.o. female with a past medical history significant for CAD, DM, HTN, See PMH for any additional medical history.   Patient admitted yesterday by cardiology for melena and dizziness.  She was heme positive by Dr.  Doylene Canard.  No available recent labs to compare but in December 2019 her hemoglobin was 12.7, it is 9.4 today.  BUN normal  Patient says her stools have been on the loose side, more frequent and very dark if not black for several months now.  She is unsure if she takes iron but it isn't on home med list. She doesn't take Bismuth. Two days ago she had several episodes of dark red blood in stool.  She is on a daily baby asa at home, no other NSAIDS. She hasn't had any abdominal pain, nausea, vomiting or weight loss. She had transient SOB a week or so ago. No chest pain.   PREVIOUS ENDOSCOPIC EVALUATIONS    June 2019 EGD for dysphagia -Laryngeal edema was found. - Laryngeal edema was found. - The examination was otherwise normal. - No specimens collected  October 2019 flexible sigmoidoscopy for evaluation of rectal bleeding Internal hemorrhoids that prolapse with straining, but require manual replacement into the anal canal (Grade III) and perianal  skin tags found on perianal exam. SOURCE OF BLEEDING - Decreased sphincter tone found on digital rectal exam. - External and internal hemorrhoids. Banded. - Diverticulosis in the sigmoid colon. Not bleeding - The examination was otherwise normal. - No specimens collected   Past Medical History:  Diagnosis Date   Coronary artery disease    Diabetes mellitus without complication ([place])    GI bleeding 11/2017   Hypertension    Internal hemorrhoids     Past Surgical History:  Procedure Laterality Date   CATARACT EXTRACTION     ESOPHAGOGASTRODUODENOSCOPY (EGD) WITH PROPOFOL N/A 08/13/2017   Procedure: ESOPHAGOGASTRODUODENOSCOPY (EGD) WITH PROPOFOL;  Surgeon: [name], MD;  Location: [place];  Service: Endoscopy;  Laterality: N/A;   FLEXIBLE SIGMOIDOSCOPY N/A 12/10/2017   Procedure: FLEXIBLE SIGMOIDOSCOPY;  Surgeon: [name], MD;  Location: [facility] ENDOSCOPY;  Service: Endoscopy;  Laterality: N/A;   HEMORRHOID BANDING  12/10/2017   Procedure: HEMORRHOID BANDING;  Surgeon: [name], MD;  Location: [facility] ENDOSCOPY;  Service: Endoscopy;;    Prior to Admission medications   Medication Sig Start Date End Date Taking? Authorizing Provider  Cholecalciferol 25 MCG  (1000 UT) tablet Take 1,000 Units by mouth daily.   Yes [provider]  Coenzyme Q10 100 MG TABS Take 100 mg by mouth daily.   Yes [provider]  diltiazem (DILACOR XR) 180 MG 24 hr capsule Take 180 mg by mouth 2 (two) times daily.   Yes [provider]  furosemide (LASIX) 40 MG tablet Take 40 mg by mouth daily.   Yes [provider]  glipiZIDE (GLUCOTROL XL) 5 MG 24 hr tablet Take 5 mg by mouth every evening.    Yes [provider]  isosorbide mononitrate (IMDUR) 30 MG 24 hr tablet Take 30 mg by mouth every evening.    Yes [provider]  metFORMIN (GLUCOPHAGE) 500 MG tablet Take 250 mg by mouth 2 (two) times daily with a meal.   Yes [provider]  metoprolol (LOPRESSOR) 50 MG tablet Take 25 mg by mouth 2 (two) times daily.    Yes [provider]  pravastatin (PRAVACHOL) 40 MG tablet Take 40 mg by mouth daily. 06/06/17  Yes [provider]  digoxin (LANOXIN) 0.25 MG tablet Take 0.5 tablets (0.125 mg total) by mouth daily. Patient not taking: Reported on 10/28/2020 08/14/17   [name], MD    Current Facility-Administered Medications  Medication Dose Route Frequency Provider Last Rate Last Admin   0.9 %  sodium chloride infusion  250 mL Intravenous PRN [name], MD       0.9 % NaCl with KCl 40 mEq / L  infusion   Intravenous Continuous [name], MD       acetaminophen (TYLENOL) tablet 650 mg  650 mg Oral Q4H PRN [name], MD       cholecalciferol (VITAMIN D3) tablet 1,000 Units  1,000 Units Oral Daily [name], MD   1,000 Units at 10/29/20 0807   ondansetron (ZOFRAN) injection 4 mg  4 mg Intravenous Q6H PRN [name], MD       pantoprazole (PROTONIX) injection 40 mg  40 mg Intravenous Q24H [name], MD   40 mg at 10/28/20 2003   pravastatin (PRAVACHOL) tablet 40 mg  40 mg Oral q1800 [name], MD       sodium chloride flush (NS) 0.9 % injection 3 mL  3 mL Intravenous Q12H  [name], MD   3 mL at  10/29/20 0808   sodium chloride flush (NS) 0.9 % injection 3 mL  3 mL Intravenous PRN [name], MD        Allergies as of 10/28/2020 - Review Complete 10/28/2020  Allergen Reaction Noted   Lisinopril  12/08/2017    No family history on file.  Social History   Socioeconomic History   Marital status: Widowed    Spouse name: Not on file   Number of children: Not on file   Years of education: Not on file   Highest education level: Not on file  Occupational History   Not on file  Tobacco Use   Smoking status: Never   Smokeless tobacco: Never  Vaping Use   Vaping Use: Never used  Substance and Sexual Activity   Alcohol use: No   Drug use: Never   Sexual activity: Not on file  Other Topics Concern   Not on file  Social History Narrative   Patient is widowed   Denies alcohol tobacco use   Social Determinants of Health   Financial Resource Strain: Not on file  Food Insecurity: Not on file  Transportation Needs: Not on file  Physical Activity: Not on file  Stress: Not on file  Social Connections: Not on file  Intimate Partner Violence: Not on file    Review of Systems: Positive for dizziness. All other systems reviewed and negative except where noted in HPI.  OBJECTIVE    Physical Exam: Vital signs in last 24 hours: Temp:  [97.7 F (36.5 C)-98.3 F (36.8 C)] 98.3 F (36.8 C) (09/15 0803) Pulse Rate:  [76-102] 102 (09/15 0803) Resp:  [16-19] 18 (09/15 0803) BP: (113-149)/(63-86) 133/86 (09/15 0803) SpO2:  [100 %] 100 % (09/15 0803) Weight:  [68.6 kg] 68.6 kg (09/15 0317) Last BM Date: 10/28/20 General:   Alert  female in NAD Psych:  Pleasant, cooperative. Normal mood and affect. Eyes:  Pupils equal, sclera clear, no icterus.   Conjunctiva pink. Ears:  Normal auditory acuity. Nose:  No deformity, discharge,  or lesions. Neck:  Supple; no masses Lungs:  Clear throughout to auscultation.   No wheezes, crackles, or rhonchi.   Heart:  Regular rate and rhythm;  no lower extremity edema Abdomen:  Soft, non-distended, nontender, BS active, no palp mass   Rectal:  Large protruding internal hemorrhoids, ? Some rectal prolapse. Underpants stained with dark stool.  No stool in vault Msk:  Symmetrical without gross deformities. . Neurologic:  Alert and  oriented x4;  grossly normal neurologically. Skin:  Intact without significant lesions or rashes.   Scheduled inpatient medications  cholecalciferol  1,000 Units Oral Daily   pantoprazole (PROTONIX) IV  40 mg Intravenous Q24H   pravastatin  40 mg Oral q1800   sodium chloride flush  3 mL Intravenous Q12H     Intake/Output from previous day: 09/14 0701 - 09/15 0700 In: 1083.9 [I.V.:1083.9] Out: -  Intake/Output this shift: No intake/output data recorded.   Lab Results: Recent Labs    10/29/20 0425  WBC 7.7  HGB 9.4*  HCT 29.2*  PLT 222   BMET Recent Labs    10/28/20 1816 10/29/20 0425  NA 141 142  K 3.4* 3.4*  CL 107 110  CO2 24 24  GLUCOSE 69* 79  BUN 18 15  CREATININE 1.43* 1.20*  CALCIUM 9.0 8.6*   LFT Recent Labs    10/28/20 1816  PROT 7.0  ALBUMIN 3.5  AST 15  ALT 10  ALKPHOS 66  BILITOT 1.1  PT/INR No results for input(s): LABPROT, INR in the last 72 hours. Hepatitis Panel No results for input(s): HEPBSAG, HCVAB, HEPAIGM, HEPBIGM in the last 72 hours.   . CBC Latest Ref Rng & Units 10/29/2020 01/25/2018 12/14/2017  WBC 4.0 - 10.5 K/uL 7.7 14.1(H) 11.0(H)  Hemoglobin 12.0 - 15.0 g/dL 9.4(L) 12.7 11.3(L)  Hematocrit 36.0 - 46.0 % 29.2(L) 38.3 34.0(L)  Platelets 150 - 400 K/uL 222 390.0 310.0    . CMP Latest Ref Rng & Units 10/29/2020 10/28/2020 12/10/2017  Glucose 70 - 99 mg/dL 79 69(L) 123(H)  BUN 8 - 23 mg/dL 15 18 7(L)  Creatinine 0.44 - 1.00 mg/dL 1.20(H) 1.43(H) 0.98  Sodium 135 - 145 mmol/L 142 141 139  Potassium 3.5 - 5.1 mmol/L 3.4(L) 3.4(L) 3.5  Chloride 98 - 111 mmol/L 110 107 108  CO2 22 - 32 mmol/L '24 24  22  '$ Calcium 8.9 - 10.3 mg/dL 8.6(L) 9.0 9.1  Total Protein 6.5 - 8.1 g/dL - 7.0 -  Total Bilirubin 0.3 - 1.2 mg/dL - 1.1 -  Alkaline Phos 38 - 126 U/L - 66 -  AST 15 - 41 U/L - 15 -  ALT 0 - 44 U/L - 10 -   Studies/Results: DG Chest 2 View  Result Date: 10/28/2020 CLINICAL DATA:  Weakness. EXAM: CHEST - 2 VIEW COMPARISON:  Chest x-ray 08/11/2016. FINDINGS: The aorta is tortuous. Cardiac silhouette is mildly enlarged. There is no focal lung consolidation, pleural effusion or pneumothorax. There is stable linear atelectasis or scarring in the left lung base. Degenerative changes affect the spine. IMPRESSION: No active cardiopulmonary disease. Electronically Signed   By: Ronney Asters M.D.   On: 10/28/2020 19:34   ECHOCARDIOGRAM COMPLETE  Result Date: 10/29/2020    ECHOCARDIOGRAM REPORT   Patient Name:   Patricia English Date of Exam: 10/29/2020 Medical Rec #:  [id number]       Height:       69.0 in Accession #:    [id number]      Weight:       151.2 lb Date of Birth:  1927/06/30      BSA:          1.835 m Patient Age:    79 years        BP:           133/86 mmHg Patient Gender: F               HR:           102 bpm. Exam Location:  Inpatient Procedure: 2D Echo, Cardiac Doppler and Color Doppler Indications:     Mitral regurgitation  History:         Patient has prior history of Echocardiogram examinations, most                  recent 10/14/2012. CAD, Mitral Valve Disease and mitral                  regurgitation, Signs/Symptoms:Anemia, GI bleed; Risk                  Factors:Hypertension and Diabetes.  Sonographer:     Dustin Flock RDCS Referring Phys:  [place] Diagnosing Phys: [name] MD IMPRESSIONS  1. Left ventricular ejection fraction, by estimation, is 65 to 70%. The left ventricle has normal function. The left ventricle has no regional wall motion abnormalities. There is mild concentric left ventricular hypertrophy. Left ventricular diastolic parameters are indeterminate.  2.  Right ventricular systolic function is normal. The right ventricular size is normal. There is normal pulmonary artery systolic pressure.  3. Left atrial size was mildly dilated.  4. Right atrial size was mild to moderately dilated.  5. The mitral valve is degenerative. Mild mitral valve regurgitation.  6. Tricuspid valve regurgitation is mild to moderate.  7. The aortic valve is tricuspid. There is mild calcification of the aortic valve. Aortic valve regurgitation is not visualized. Mild aortic valve sclerosis is present, with no evidence of aortic valve stenosis.  8. There is mild (Grade II) atheroma plaque involving the aortic root and ascending aorta.  9. The inferior vena cava is normal in size with <50% respiratory variability, suggesting right atrial pressure of 8 mmHg. FINDINGS  Left Ventricle: Left ventricular ejection fraction, by estimation, is 65 to 70%. The left ventricle has normal function. The left ventricle has no regional wall motion abnormalities. The left ventricular internal cavity size was normal in size. There is  mild concentric left ventricular hypertrophy. Left ventricular diastolic parameters are indeterminate. Right Ventricle: The right ventricular size is normal. No increase in right ventricular wall thickness. Right ventricular systolic function is normal. There is normal pulmonary artery systolic pressure. The tricuspid regurgitant velocity is 2.82 m/s, and  with an assumed right atrial pressure of 3 mmHg, the estimated right ventricular systolic pressure is [id number]. Left Atrium: Left atrial size was mildly dilated. Right Atrium: Right atrial size was mild to moderately dilated. Pericardium: There is no evidence of pericardial effusion. Mitral Valve: The mitral valve is degenerative in appearance. There is mild thickening of the mitral valve leaflet(s). There is severe calcification of the mitral valve leaflet(s). Normal mobility of the mitral valve leaflets. Mild mitral valve  regurgitation. Tricuspid Valve: The tricuspid valve is normal in structure. Tricuspid valve regurgitation is mild to moderate. Aortic Valve: The aortic valve is tricuspid. There is mild calcification of the aortic valve. There is mild aortic valve annular calcification. Aortic valve regurgitation is not visualized. Mild aortic valve sclerosis is present, with no evidence of aortic valve stenosis. Pulmonic Valve: The pulmonic valve was normal in structure. Pulmonic valve regurgitation is not visualized. Aorta: The aortic root is normal in size and structure. There is mild (Grade II) atheroma plaque involving the aortic root and ascending aorta. Venous: The inferior vena cava is normal in size with less than 50% respiratory variability, suggesting right atrial pressure of 8 mmHg. IAS/Shunts: The atrial septum is grossly normal.  LEFT VENTRICLE PLAX 2D LVIDd:         4.00 cm  Diastology LVIDs:         2.50 cm  LV e' medial:    10.30 cm/s LV PW:         1.10 cm  LV E/e' medial:  9.7 LV IVS:        1.10 cm  LV e' lateral:   13.30 cm/s LVOT diam:     2.20 cm  LV E/e' lateral: 7.5 LV SV:         67 LV SV Index:   37 LVOT Area:     3.80 cm  RIGHT VENTRICLE RV Basal diam:  3.10 cm RV S prime:     10.70 cm/s TAPSE (M-mode): 3.0 cm LEFT ATRIUM           Index       RIGHT ATRIUM           Index LA diam:  3.40 cm 1.85 cm/m  RA Area:     17.50 cm LA Vol (A2C): 30.7 ml 16.73 ml/m RA Volume:   47.10 ml  25.67 ml/m LA Vol (A4C): 89.4 ml 48.73 ml/m  AORTIC VALVE LVOT Vmax:   93.80 cm/s LVOT Vmean:  63.200 cm/s LVOT VTI:    0.177 m  AORTA Ao Root diam: 3.20 cm MITRAL VALVE                TRICUSPID VALVE MV Area (PHT): 3.61 cm     TR Peak grad:   31.8 mmHg MV Decel Time: 210 msec     TR Vmax:        282.00 cm/s MV E velocity: 100.00 cm/s                             SHUNTS                             Systemic VTI:  0.18 m                             Systemic Diam: 2.20 cm [name] MD Electronically signed by [name]  MD Signature Date/Time: 10/29/2020/10:10:21 AM    Final     Principal Problem:   Acute GI bleeding Active Problems:   DM2 (diabetes mellitus, type 2) ([facility])   Weakness   Acute upper GI bleed    Tye Savoy, NP-C @  10/29/2020, 11:02 AM

## 2020-10-29 NOTE — Consult Note (Addendum)
Referring Provider:  [facility]         Primary Care Physician:  [name], MD Primary Gastroenterologist:   [name], MD  Reason for Consultation:    melena                ASSESSMENT / PLAN   # 87 yo female dark, FOBT+ stool and anemia. Stool dark for months but two days ago she had several episodes of dark red blood in stool. Her underpants have dark stains. She doesn't take oral iron the best I can tell.  Presumably she is having upper GI bleeding but the dark red blood in stool a couple of days ago is concerning. Furthermore her BUN is normal ( though it can be is this is more of a chronic upper bleed) --It is reasonable to start with an EGD. The risks and benefits of EGD with possible biopsies were discussed with the patient who agrees to proceed.  --Clear liquids today, NPO after MN.   --Increasing PPI to BID  # Bowel changes. Several month history of stools being on loose side and a little more frequent which could be secondary to GI bleeding. Of note we saw her in December 2019 for diarrhea. There was some concern that Metformin may be responsible. She is still on metformin.  --Further evaluation pending clinical course  # Hgb 9.4 ( baseline unknown other than it was 12.7 in 2019) --Trend hgb, transfuse if needed.   HISTORY OF PRESENT ILLNESS                                                                                                                         Chief Complaint: dark stools  Patricia English is a 87 y.o. female with a past medical history significant for CAD, DM, HTN, See PMH for any additional medical history.   Patient admitted yesterday by cardiology for melena and dizziness.  She was heme positive by Dr.  Doylene Canard.  No available recent labs to compare but in December 2019 her hemoglobin was 12.7, it is 9.4 today.  BUN normal  Patient says her stools have been on the loose side, more frequent and very dark if not black for several months now.  She is unsure if she takes iron but it isn't on home med list. She doesn't take Bismuth. Two days ago she had several episodes of dark red blood in stool.  She is on a daily baby asa at home, no other NSAIDS. She hasn't had any abdominal pain, nausea, vomiting or weight loss. She had transient SOB a week or so ago. No chest pain.   PREVIOUS ENDOSCOPIC EVALUATIONS    June 2019 EGD for dysphagia -Laryngeal edema was found. - Laryngeal edema was found. - The examination was otherwise normal. - No specimens collected  October 2019 flexible sigmoidoscopy for evaluation of rectal bleeding Internal hemorrhoids that prolapse with straining, but require manual replacement into the anal canal (Grade III) and perianal  skin tags found on perianal exam. SOURCE OF BLEEDING - Decreased sphincter tone found on digital rectal exam. - External and internal hemorrhoids. Banded. - Diverticulosis in the sigmoid colon. Not bleeding - The examination was otherwise normal. - No specimens collected   Past Medical History:  Diagnosis Date   Coronary artery disease    Diabetes mellitus without complication ([place])    GI bleeding 11/2017   Hypertension    Internal hemorrhoids     Past Surgical History:  Procedure Laterality Date   CATARACT EXTRACTION     ESOPHAGOGASTRODUODENOSCOPY (EGD) WITH PROPOFOL N/A 08/13/2017   Procedure: ESOPHAGOGASTRODUODENOSCOPY (EGD) WITH PROPOFOL;  Surgeon: [name], MD;  Location: [place];  Service: Endoscopy;  Laterality: N/A;   FLEXIBLE SIGMOIDOSCOPY N/A 12/10/2017   Procedure: FLEXIBLE SIGMOIDOSCOPY;  Surgeon: [name], MD;  Location: [facility] ENDOSCOPY;  Service: Endoscopy;  Laterality: N/A;   HEMORRHOID BANDING  12/10/2017   Procedure: HEMORRHOID BANDING;  Surgeon: [name], MD;  Location: [facility] ENDOSCOPY;  Service: Endoscopy;;    Prior to Admission medications   Medication Sig Start Date End Date Taking? Authorizing Provider  Cholecalciferol 25 MCG  (1000 UT) tablet Take 1,000 Units by mouth daily.   Yes [provider]  Coenzyme Q10 100 MG TABS Take 100 mg by mouth daily.   Yes [provider]  diltiazem (DILACOR XR) 180 MG 24 hr capsule Take 180 mg by mouth 2 (two) times daily.   Yes [provider]  furosemide (LASIX) 40 MG tablet Take 40 mg by mouth daily.   Yes [provider]  glipiZIDE (GLUCOTROL XL) 5 MG 24 hr tablet Take 5 mg by mouth every evening.    Yes [provider]  isosorbide mononitrate (IMDUR) 30 MG 24 hr tablet Take 30 mg by mouth every evening.    Yes [provider]  metFORMIN (GLUCOPHAGE) 500 MG tablet Take 250 mg by mouth 2 (two) times daily with a meal.   Yes [provider]  metoprolol (LOPRESSOR) 50 MG tablet Take 25 mg by mouth 2 (two) times daily.    Yes [provider]  pravastatin (PRAVACHOL) 40 MG tablet Take 40 mg by mouth daily. 06/06/17  Yes [provider]  digoxin (LANOXIN) 0.25 MG tablet Take 0.5 tablets (0.125 mg total) by mouth daily. Patient not taking: Reported on 10/28/2020 08/14/17   [name], MD    Current Facility-Administered Medications  Medication Dose Route Frequency Provider Last Rate Last Admin   0.9 %  sodium chloride infusion  250 mL Intravenous PRN [name], MD       0.9 % NaCl with KCl 40 mEq / L  infusion   Intravenous Continuous [name], MD       acetaminophen (TYLENOL) tablet 650 mg  650 mg Oral Q4H PRN [name], MD       cholecalciferol (VITAMIN D3) tablet 1,000 Units  1,000 Units Oral Daily [name], MD   1,000 Units at 10/29/20 0807   ondansetron (ZOFRAN) injection 4 mg  4 mg Intravenous Q6H PRN [name], MD       pantoprazole (PROTONIX) injection 40 mg  40 mg Intravenous Q24H [name], MD   40 mg at 10/28/20 2003   pravastatin (PRAVACHOL) tablet 40 mg  40 mg Oral q1800 [name], MD       sodium chloride flush (NS) 0.9 % injection 3 mL  3 mL Intravenous Q12H  [name], MD   3 mL at  10/29/20 0808   sodium chloride flush (NS) 0.9 % injection 3 mL  3 mL Intravenous PRN [name], MD        Allergies as of 10/28/2020 - Review Complete 10/28/2020  Allergen Reaction Noted   Lisinopril  12/08/2017    No family history on file.  Social History   Socioeconomic History   Marital status: Widowed    Spouse name: Not on file   Number of children: Not on file   Years of education: Not on file   Highest education level: Not on file  Occupational History   Not on file  Tobacco Use   Smoking status: Never   Smokeless tobacco: Never  Vaping Use   Vaping Use: Never used  Substance and Sexual Activity   Alcohol use: No   Drug use: Never   Sexual activity: Not on file  Other Topics Concern   Not on file  Social History Narrative   Patient is widowed   Denies alcohol tobacco use   Social Determinants of Health   Financial Resource Strain: Not on file  Food Insecurity: Not on file  Transportation Needs: Not on file  Physical Activity: Not on file  Stress: Not on file  Social Connections: Not on file  Intimate Partner Violence: Not on file    Review of Systems: Positive for dizziness. All other systems reviewed and negative except where noted in HPI.  OBJECTIVE    Physical Exam: Vital signs in last 24 hours: Temp:  [97.7 F (36.5 C)-98.3 F (36.8 C)] 98.3 F (36.8 C) (09/15 0803) Pulse Rate:  [76-102] 102 (09/15 0803) Resp:  [16-19] 18 (09/15 0803) BP: (113-149)/(63-86) 133/86 (09/15 0803) SpO2:  [100 %] 100 % (09/15 0803) Weight:  [68.6 kg] 68.6 kg (09/15 0317) Last BM Date: 10/28/20 General:   Alert  female in NAD Psych:  Pleasant, cooperative. Normal mood and affect. Eyes:  Pupils equal, sclera clear, no icterus.   Conjunctiva pink. Ears:  Normal auditory acuity. Nose:  No deformity, discharge,  or lesions. Neck:  Supple; no masses Lungs:  Clear throughout to auscultation.   No wheezes, crackles, or rhonchi.   Heart:  Regular rate and rhythm;  no lower extremity edema Abdomen:  Soft, non-distended, nontender, BS active, no palp mass   Rectal:  Large protruding internal hemorrhoids, ? Some rectal prolapse. Underpants stained with dark stool.  No stool in vault Msk:  Symmetrical without gross deformities. . Neurologic:  Alert and  oriented x4;  grossly normal neurologically. Skin:  Intact without significant lesions or rashes.   Scheduled inpatient medications  cholecalciferol  1,000 Units Oral Daily   pantoprazole (PROTONIX) IV  40 mg Intravenous Q24H   pravastatin  40 mg Oral q1800   sodium chloride flush  3 mL Intravenous Q12H     Intake/Output from previous day: 09/14 0701 - 09/15 0700 In: 1083.9 [I.V.:1083.9] Out: -  Intake/Output this shift: No intake/output data recorded.   Lab Results: Recent Labs    10/29/20 0425  WBC 7.7  HGB 9.4*  HCT 29.2*  PLT 222   BMET Recent Labs    10/28/20 1816 10/29/20 0425  NA 141 142  K 3.4* 3.4*  CL 107 110  CO2 24 24  GLUCOSE 69* 79  BUN 18 15  CREATININE 1.43* 1.20*  CALCIUM 9.0 8.6*   LFT Recent Labs    10/28/20 1816  PROT 7.0  ALBUMIN 3.5  AST 15  ALT 10  ALKPHOS 66  BILITOT 1.1  PT/INR No results for input(s): LABPROT, INR in the last 72 hours. Hepatitis Panel No results for input(s): HEPBSAG, HCVAB, HEPAIGM, HEPBIGM in the last 72 hours.   . CBC Latest Ref Rng & Units 10/29/2020 01/25/2018 12/14/2017  WBC 4.0 - 10.5 K/uL 7.7 14.1(H) 11.0(H)  Hemoglobin 12.0 - 15.0 g/dL 9.4(L) 12.7 11.3(L)  Hematocrit 36.0 - 46.0 % 29.2(L) 38.3 34.0(L)  Platelets 150 - 400 K/uL 222 390.0 310.0    . CMP Latest Ref Rng & Units 10/29/2020 10/28/2020 12/10/2017  Glucose 70 - 99 mg/dL 79 69(L) 123(H)  BUN 8 - 23 mg/dL 15 18 7(L)  Creatinine 0.44 - 1.00 mg/dL 1.20(H) 1.43(H) 0.98  Sodium 135 - 145 mmol/L 142 141 139  Potassium 3.5 - 5.1 mmol/L 3.4(L) 3.4(L) 3.5  Chloride 98 - 111 mmol/L 110 107 108  CO2 22 - 32 mmol/L '24 24  22  '$ Calcium 8.9 - 10.3 mg/dL 8.6(L) 9.0 9.1  Total Protein 6.5 - 8.1 g/dL - 7.0 -  Total Bilirubin 0.3 - 1.2 mg/dL - 1.1 -  Alkaline Phos 38 - 126 U/L - 66 -  AST 15 - 41 U/L - 15 -  ALT 0 - 44 U/L - 10 -   Studies/Results: DG Chest 2 View  Result Date: 10/28/2020 CLINICAL DATA:  Weakness. EXAM: CHEST - 2 VIEW COMPARISON:  Chest x-ray 08/11/2016. FINDINGS: The aorta is tortuous. Cardiac silhouette is mildly enlarged. There is no focal lung consolidation, pleural effusion or pneumothorax. There is stable linear atelectasis or scarring in the left lung base. Degenerative changes affect the spine. IMPRESSION: No active cardiopulmonary disease. Electronically Signed   By: Ronney Asters M.D.   On: 10/28/2020 19:34   ECHOCARDIOGRAM COMPLETE  Result Date: 10/29/2020    ECHOCARDIOGRAM REPORT   Patient Name:   Patricia English Date of Exam: 10/29/2020 Medical Rec #:  [id number]       Height:       69.0 in Accession #:    [id number]      Weight:       151.2 lb Date of Birth:  Jun 05, 1927      BSA:          1.835 m Patient Age:    40 years        BP:           133/86 mmHg Patient Gender: F               HR:           102 bpm. Exam Location:  Inpatient Procedure: 2D Echo, Cardiac Doppler and Color Doppler Indications:     Mitral regurgitation  History:         Patient has prior history of Echocardiogram examinations, most                  recent 10/14/2012. CAD, Mitral Valve Disease and mitral                  regurgitation, Signs/Symptoms:Anemia, GI bleed; Risk                  Factors:Hypertension and Diabetes.  Sonographer:     Dustin Flock RDCS Referring Phys:  [place] Diagnosing Phys: [name] MD IMPRESSIONS  1. Left ventricular ejection fraction, by estimation, is 65 to 70%. The left ventricle has normal function. The left ventricle has no regional wall motion abnormalities. There is mild concentric left ventricular hypertrophy. Left ventricular diastolic parameters are indeterminate.  2.  Right ventricular systolic function is normal. The right ventricular size is normal. There is normal pulmonary artery systolic pressure.  3. Left atrial size was mildly dilated.  4. Right atrial size was mild to moderately dilated.  5. The mitral valve is degenerative. Mild mitral valve regurgitation.  6. Tricuspid valve regurgitation is mild to moderate.  7. The aortic valve is tricuspid. There is mild calcification of the aortic valve. Aortic valve regurgitation is not visualized. Mild aortic valve sclerosis is present, with no evidence of aortic valve stenosis.  8. There is mild (Grade II) atheroma plaque involving the aortic root and ascending aorta.  9. The inferior vena cava is normal in size with <50% respiratory variability, suggesting right atrial pressure of 8 mmHg. FINDINGS  Left Ventricle: Left ventricular ejection fraction, by estimation, is 65 to 70%. The left ventricle has normal function. The left ventricle has no regional wall motion abnormalities. The left ventricular internal cavity size was normal in size. There is  mild concentric left ventricular hypertrophy. Left ventricular diastolic parameters are indeterminate. Right Ventricle: The right ventricular size is normal. No increase in right ventricular wall thickness. Right ventricular systolic function is normal. There is normal pulmonary artery systolic pressure. The tricuspid regurgitant velocity is 2.82 m/s, and  with an assumed right atrial pressure of 3 mmHg, the estimated right ventricular systolic pressure is [id number]. Left Atrium: Left atrial size was mildly dilated. Right Atrium: Right atrial size was mild to moderately dilated. Pericardium: There is no evidence of pericardial effusion. Mitral Valve: The mitral valve is degenerative in appearance. There is mild thickening of the mitral valve leaflet(s). There is severe calcification of the mitral valve leaflet(s). Normal mobility of the mitral valve leaflets. Mild mitral valve  regurgitation. Tricuspid Valve: The tricuspid valve is normal in structure. Tricuspid valve regurgitation is mild to moderate. Aortic Valve: The aortic valve is tricuspid. There is mild calcification of the aortic valve. There is mild aortic valve annular calcification. Aortic valve regurgitation is not visualized. Mild aortic valve sclerosis is present, with no evidence of aortic valve stenosis. Pulmonic Valve: The pulmonic valve was normal in structure. Pulmonic valve regurgitation is not visualized. Aorta: The aortic root is normal in size and structure. There is mild (Grade II) atheroma plaque involving the aortic root and ascending aorta. Venous: The inferior vena cava is normal in size with less than 50% respiratory variability, suggesting right atrial pressure of 8 mmHg. IAS/Shunts: The atrial septum is grossly normal.  LEFT VENTRICLE PLAX 2D LVIDd:         4.00 cm  Diastology LVIDs:         2.50 cm  LV e' medial:    10.30 cm/s LV PW:         1.10 cm  LV E/e' medial:  9.7 LV IVS:        1.10 cm  LV e' lateral:   13.30 cm/s LVOT diam:     2.20 cm  LV E/e' lateral: 7.5 LV SV:         67 LV SV Index:   37 LVOT Area:     3.80 cm  RIGHT VENTRICLE RV Basal diam:  3.10 cm RV S prime:     10.70 cm/s TAPSE (M-mode): 3.0 cm LEFT ATRIUM           Index       RIGHT ATRIUM           Index LA diam:  3.40 cm 1.85 cm/m  RA Area:     17.50 cm LA Vol (A2C): 30.7 ml 16.73 ml/m RA Volume:   47.10 ml  25.67 ml/m LA Vol (A4C): 89.4 ml 48.73 ml/m  AORTIC VALVE LVOT Vmax:   93.80 cm/s LVOT Vmean:  63.200 cm/s LVOT VTI:    0.177 m  AORTA Ao Root diam: 3.20 cm MITRAL VALVE                TRICUSPID VALVE MV Area (PHT): 3.61 cm     TR Peak grad:   31.8 mmHg MV Decel Time: 210 msec     TR Vmax:        282.00 cm/s MV E velocity: 100.00 cm/s                             SHUNTS                             Systemic VTI:  0.18 m                             Systemic Diam: 2.20 cm [name] MD Electronically signed by [name]  MD Signature Date/Time: 10/29/2020/10:10:21 AM    Final     Principal Problem:   Acute GI bleeding Active Problems:   DM2 (diabetes mellitus, type 2) (HCC)   Weakness   Acute upper GI bleed    Tye Savoy, NP-C @  10/29/2020, 11:02 AM

## 2020-10-30 ENCOUNTER — Inpatient Hospital Stay (HOSPITAL_COMMUNITY): Payer: Medicare Other | Admitting: Anesthesiology

## 2020-10-30 ENCOUNTER — Encounter (HOSPITAL_COMMUNITY)
Admission: AD | Disposition: A | Discharge status: Home / Self Care | Payer: Self-pay | Source: Ambulatory Visit | Attending: Cardiovascular Disease

## 2020-10-30 DIAGNOSIS — K921 Melena: Principal | ICD-10-CM

## 2020-10-30 HISTORY — PX: ESOPHAGOGASTRODUODENOSCOPY (EGD) WITH PROPOFOL: SHX5813

## 2020-10-30 HISTORY — PX: BIOPSY: SHX5522

## 2020-10-30 LAB — IRON AND TIBC
Iron: 51 ug/dL (ref 28–170)
Saturation Ratios: 16 % (ref 10.4–31.8)
TIBC: 319 ug/dL (ref 250–450)
UIBC: 268 ug/dL

## 2020-10-30 LAB — FERRITIN: Ferritin: 48 ng/mL (ref 11–307)

## 2020-10-30 LAB — CBC WITH DIFFERENTIAL/PLATELET
Abs Immature Granulocytes: 0.03 10*3/uL (ref 0.00–0.07)
Basophils Absolute: 0 10*3/uL (ref 0.0–0.1)
Basophils Relative: 1 %
Eosinophils Absolute: 0.2 10*3/uL (ref 0.0–0.5)
Eosinophils Relative: 2 %
HCT: 29.5 % — ABNORMAL LOW (ref 36.0–46.0)
Hemoglobin: 9.9 g/dL — ABNORMAL LOW (ref 12.0–15.0)
Immature Granulocytes: 0 %
Lymphocytes Relative: 24 %
Lymphs Abs: 1.9 10*3/uL (ref 0.7–4.0)
MCH: 30.2 pg (ref 26.0–34.0)
MCHC: 33.6 g/dL (ref 30.0–36.0)
MCV: 89.9 fL (ref 80.0–100.0)
Monocytes Absolute: 0.8 10*3/uL (ref 0.1–1.0)
Monocytes Relative: 10 %
Neutro Abs: 5.2 10*3/uL (ref 1.7–7.7)
Neutrophils Relative %: 63 %
Platelets: 220 10*3/uL (ref 150–400)
RBC: 3.28 MIL/uL — ABNORMAL LOW (ref 3.87–5.11)
RDW: 13 % (ref 11.5–15.5)
WBC: 8.2 10*3/uL (ref 4.0–10.5)
nRBC: 0 % (ref 0.0–0.2)

## 2020-10-30 LAB — CBC
HCT: 32.8 % — ABNORMAL LOW (ref 36.0–46.0)
Hemoglobin: 10.9 g/dL — ABNORMAL LOW (ref 12.0–15.0)
MCH: 29.8 pg (ref 26.0–34.0)
MCHC: 33.2 g/dL (ref 30.0–36.0)
MCV: 89.6 fL (ref 80.0–100.0)
Platelets: 236 10*3/uL (ref 150–400)
RBC: 3.66 MIL/uL — ABNORMAL LOW (ref 3.87–5.11)
RDW: 13.1 % (ref 11.5–15.5)
WBC: 9.4 10*3/uL (ref 4.0–10.5)
nRBC: 0 % (ref 0.0–0.2)

## 2020-10-30 LAB — BASIC METABOLIC PANEL
Anion gap: 8 (ref 5–15)
BUN: 8 mg/dL (ref 8–23)
CO2: 26 mmol/L (ref 22–32)
Calcium: 8.9 mg/dL (ref 8.9–10.3)
Chloride: 108 mmol/L (ref 98–111)
Creatinine, Ser: 1.03 mg/dL — ABNORMAL HIGH (ref 0.44–1.00)
GFR, Estimated: 52 mL/min — ABNORMAL LOW (ref >60)
Glucose, Bld: 113 mg/dL — ABNORMAL HIGH (ref 70–99)
Potassium: 3.8 mmol/L (ref 3.5–5.1)
Sodium: 142 mmol/L (ref 135–145)

## 2020-10-30 SURGERY — ESOPHAGOGASTRODUODENOSCOPY (EGD) WITH PROPOFOL
Anesthesia: Monitor Anesthesia Care

## 2020-10-30 MED ORDER — PEG-KCL-NACL-NASULF-NA ASC-C 100 G PO SOLR: 1.0000 | Freq: Once | ORAL | Status: DC

## 2020-10-30 MED ORDER — LABETALOL HCL 5 MG/ML IV SOLN
INTRAVENOUS | Status: AC
  Filled 2020-10-30: qty 4

## 2020-10-30 MED ORDER — PEG-KCL-NACL-NASULF-NA ASC-C 100 G PO SOLR
0.5000 | Freq: Once | ORAL | Status: AC
  Administered 2020-10-31: 100 g via ORAL

## 2020-10-30 MED ORDER — LACTATED RINGERS IV SOLN: INTRAVENOUS | Status: DC | PRN

## 2020-10-30 MED ORDER — PEG-KCL-NACL-NASULF-NA ASC-C 100 G PO SOLR
0.5000 | Freq: Once | ORAL | Status: AC
  Administered 2020-10-30: 100 g via ORAL
  Filled 2020-10-30: qty 1

## 2020-10-30 MED ORDER — LABETALOL HCL 5 MG/ML IV SOLN
10.0000 mg | Freq: Once | INTRAVENOUS | Status: AC
  Administered 2020-10-30: 10 mg via INTRAVENOUS

## 2020-10-30 MED ORDER — PROPOFOL 10 MG/ML IV BOLUS
INTRAVENOUS | Status: DC | PRN
  Administered 2020-10-30 (×2): 20 mg via INTRAVENOUS
  Administered 2020-10-30 (×2): 50 mg via INTRAVENOUS
  Administered 2020-10-30 (×3): 20 mg via INTRAVENOUS

## 2020-10-30 MED ORDER — LABETALOL HCL 5 MG/ML IV SOLN
INTRAVENOUS | Status: DC | PRN
  Administered 2020-10-30: 2.5 mg via INTRAVENOUS

## 2020-10-30 SURGICAL SUPPLY — 15 items

## 2020-10-30 NOTE — Interval H&P Note (Signed)
History and Physical Interval Note:  10/30/2020 2:34 PM  Patricia English  has presented today for surgery, with the diagnosis of Anemia, melena.  The various methods of treatment have been discussed with the patient and family. After consideration of risks, benefits and other options for treatment, the patient has consented to  Procedure(s): ESOPHAGOGASTRODUODENOSCOPY (EGD) WITH PROPOFOL (N/A) as a surgical intervention.  The patient's history has been reviewed, patient examined, no change in status, stable for surgery.  I have reviewed the patient's chart and labs.  Questions were answered to the patient's satisfaction.     Sharyn Creamer

## 2020-10-30 NOTE — Anesthesia Preprocedure Evaluation (Addendum)
Anesthesia Evaluation  Patient identified by MRN, date of birth, ID band Patient awake    Reviewed: Allergy & Precautions, NPO status , Patient's Chart, lab work & pertinent test results  Airway Mallampati: III  TM Distance: >3 FB Neck ROM: Full    Dental  (+) Dental Advisory Given, Chipped, Poor Dentition,    Pulmonary neg pulmonary ROS,    Pulmonary exam normal breath sounds clear to auscultation       Cardiovascular hypertension, Pt. on home beta blockers and Pt. on medications + CAD  + dysrhythmias Atrial Fibrillation  Rhythm:Irregular Rate:Abnormal     Neuro/Psych negative neurological ROS  negative psych ROS   GI/Hepatic Neg liver ROS, Melena    Endo/Other  diabetes, Type 2, Oral Hypoglycemic Agents  Renal/GU Renal InsufficiencyRenal disease     Musculoskeletal negative musculoskeletal ROS (+)   Abdominal   Peds  Hematology  (+) Blood dyscrasia, anemia ,   Anesthesia Other Findings Day of surgery medications reviewed with the patient.  Reproductive/Obstetrics                            Anesthesia Physical Anesthesia Plan  ASA: 3  Anesthesia Plan: MAC   Post-op Pain Management:    Induction: Intravenous  PONV Risk Score and Plan: 2 and Propofol infusion and Treatment may vary due to age or medical condition  Airway Management Planned: Nasal Cannula and Natural Airway  Additional Equipment:   Intra-op Plan:   Post-operative Plan:   Informed Consent: I have reviewed the patients History and Physical, chart, labs and discussed the procedure including the risks, benefits and alternatives for the proposed anesthesia with the patient or authorized representative who has indicated his/her understanding and acceptance.     Dental advisory given  Plan Discussed with: CRNA and Anesthesiologist  Anesthesia Plan Comments:         Anesthesia Quick Evaluation

## 2020-10-30 NOTE — Progress Notes (Signed)
Ref: [name], MD   Subjective:  Awake. Awaiting EGD. VS stable. No additional GI bleed. No further drop in Hgb. Hypokalemia is corrected.  Objective:  Vital Signs in the last 24 hours: Temp:  [96.3 F (35.7 C)-98.8 F (37.1 C)] 97.6 F (36.4 C) (09/16 1933) Pulse Rate:  [65-112] 92 (09/16 1933) Cardiac Rhythm: Atrial fibrillation (09/16 1900) Resp:  [18-35] 20 (09/16 1620) BP: (118-174)/(80-103) 147/80 (09/16 1933) SpO2:  [100 %] 100 % (09/16 1933) Weight:  [76.6 kg] 76.6 kg (09/16 0324)  Physical Exam: BP Readings from Last 1 Encounters:  10/30/20 (!) 147/80     Wt Readings from Last 1 Encounters:  10/30/20 76.6 kg    Weight change: 8.058 kg Body mass index is 26.45 kg/m. HEENT: [place]/AT, Eyes-Brown, Conjunctiva-Pink, Sclera-Non-icteric Neck: No JVD, No bruit, Trachea midline. Lungs:  Clear, Bilateral. Cardiac:  Regular rhythm, normal S1 and S2, no S3. II/VI systolic murmur. Abdomen:  Soft, non-tender. BS present. Extremities:  No edema present. No cyanosis. No clubbing. CNS: AxOx3, Cranial nerves grossly intact, moves all 4 extremities.  Skin: Warm and dry.   Intake/Output from previous day: 09/15 0701 - 09/16 0700 In: 2070.3 [P.O.:720; I.V.:1350.3] Out: 2 [Urine:1; Stool:1]    Lab Results: BMET    Component Value Date/Time   NA 142 10/30/2020 0410   NA 142 10/29/2020 0425   NA 141 10/28/2020 1816   K 3.8 10/30/2020 0410   K 3.4 (L) 10/29/2020 0425   K 3.4 (L) 10/28/2020 1816   CL 108 10/30/2020 0410   CL 110 10/29/2020 0425   CL 107 10/28/2020 1816   CO2 26 10/30/2020 0410   CO2 24 10/29/2020 0425   CO2 24 10/28/2020 1816   GLUCOSE 113 (H) 10/30/2020 0410   GLUCOSE 79 10/29/2020 0425   GLUCOSE 69 (L) 10/28/2020 1816   BUN 8 10/30/2020 0410   BUN 15 10/29/2020 0425   BUN 18 10/28/2020 1816   CREATININE 1.03 (H) 10/30/2020 0410   CREATININE 1.20 (H) 10/29/2020 0425   CREATININE 1.43 (H) 10/28/2020 1816   CALCIUM 8.9 10/30/2020 0410   CALCIUM  8.6 (L) 10/29/2020 0425   CALCIUM 9.0 10/28/2020 1816   GFRNONAA 52 (L) 10/30/2020 0410   GFRNONAA 43 (L) 10/29/2020 0425   GFRNONAA 35 (L) 10/28/2020 1816   GFRAA 58 (L) 12/10/2017 0015   GFRAA >60 12/09/2017 0424   GFRAA 57 (L) 12/08/2017 0758   CBC    Component Value Date/Time   WBC 9.4 10/30/2020 1127   RBC 3.66 (L) 10/30/2020 1127   HGB 10.9 (L) 10/30/2020 1127   HCT 32.8 (L) 10/30/2020 1127   PLT 236 10/30/2020 1127   MCV 89.6 10/30/2020 1127   MCH 29.8 10/30/2020 1127   MCHC 33.2 10/30/2020 1127   RDW 13.1 10/30/2020 1127   LYMPHSABS 1.9 10/30/2020 0410   MONOABS 0.8 10/30/2020 0410   EOSABS 0.2 10/30/2020 0410   BASOSABS 0.0 10/30/2020 0410   HEPATIC Function Panel Recent Labs    10/28/20 1816  PROT 7.0   HEMOGLOBIN A1C No components found for: HGA1C,  MPG CARDIAC ENZYMES Lab Results  Component Value Date   TROPONINI <0.30 11/22/2013   TROPONINI <0.30 10/13/2012   BNP No results for input(s): PROBNP in the last 8760 hours. TSH No results for input(s): TSH in the last 8760 hours. CHOLESTEROL No results for input(s): CHOL in the last 8760 hours.  Scheduled Meds:  cholecalciferol  1,000 Units Oral Daily   pantoprazole (PROTONIX) IV  40  mg Intravenous Q12H   [START ON 10/31/2020] peg 3350 powder  0.5 kit Oral Once   pravastatin  40 mg Oral q1800   sodium chloride flush  3 mL Intravenous Q12H   Continuous Infusions:  sodium chloride     0.9 % NaCl with KCl 40 mEq / L 75 mL/hr at 10/30/20 1700   PRN Meds:.sodium chloride, acetaminophen, ondansetron (ZOFRAN) IV, sodium chloride flush  Assessment/Plan: Acute GI bleed HTN Type 2 DM CAD MR  TR  Plan: Continue medical treatment. Appreciate GI consult.   LOS: 2 days   Time spent including chart review, lab review, examination, discussion with patient : 25 min   [name]  MD  10/30/2020, 9:25 PM

## 2020-10-30 NOTE — Op Note (Signed)
[facility] Patient Name: Patricia English Procedure Date : 10/30/2020 MRN: [id number] Attending MD: Georgian Co ,  Date of Birth: 03/02/1927 CSN: [id number] Age: 87 Admit Type: Inpatient Procedure:                Upper GI endoscopy Indications:              Melena, Anemia Providers:                Adline Mango" Melinda Crutch, RN, Laverda Sorenson, Technician Referring MD:             [name] Medicines:                Monitored Anesthesia Care Complications:            No immediate complications. Estimated Blood Loss:     Estimated blood loss was minimal. Procedure:                Pre-Anesthesia Assessment:                           - Prior to the procedure, a History and Physical                            was performed, and patient medications and                            allergies were reviewed. The patient is competent.                            The risks and benefits of the procedure and the                            sedation options and risks were discussed with the                            patient. All questions were answered and informed                            consent was obtained. Patient identification and                            proposed procedure were verified by the physician                            in the pre-procedure area. Prophylactic                            Antibiotics: The patient does not require                            prophylactic antibiotics. Prior Anticoagulants: The                            patient has taken  no previous anticoagulant or                            antiplatelet agents. ASA Grade Assessment: III - A                            patient with severe systemic disease. After                            reviewing the risks and benefits, the patient was                            deemed in satisfactory condition to undergo the                            procedure. The  anesthesia plan was to use monitored                            anesthesia care (MAC). Immediately prior to                            administration of medications, the patient was                            re-assessed for adequacy to receive sedatives. The                            heart rate, respiratory rate, oxygen saturations,                            blood pressure, adequacy of pulmonary ventilation,                            and response to care were monitored throughout the                            procedure. The physical status of the patient was                            re-assessed after the procedure.                           After obtaining informed consent, the endoscope was                            passed under direct vision. Throughout the                            procedure, the patient's blood pressure, pulse, and                            oxygen saturations were monitored continuously. The  [id number]-[id number] ([id number]) Olympus endoscope was introduced                            through the mouth, and advanced to the second part                            of duodenum. Scope In: Scope Out: Findings:      The examined esophagus was normal.      The entire examined stomach was normal. Biopsies were taken with a cold       forceps for Helicobacter pylori testing.      The examined duodenum was normal. Impression:               - Normal esophagus.                           - Normal stomach. Biopsied.                           - Normal examined duodenum. Recommendation:           - Return patient to hospital ward for ongoing care.                           - Perform a colonoscopy tomorrow.                           - The findings and recommendations were discussed                            with the patient. Procedure Code(s):        --- Professional ---                           [phone], Esophagogastroduodenoscopy, flexible,                             transoral; with biopsy, single or multiple Diagnosis Code(s):        --- Professional ---                           K92.1, Melena (includes Hematochezia)                           D64.9, Anemia, unspecified CPT copyright 2019 American Medical Association. All rights reserved. The codes documented in this report are preliminary and upon coder review may  be revised to meet current compliance requirements. Patricia English "Patricia English,  10/30/2020 4:15:06 PM Number of Addenda: 0

## 2020-10-30 NOTE — Transfer of Care (Signed)
Immediate Anesthesia Transfer of Care Note  Patient: Patricia English  Procedure(s) Performed: ESOPHAGOGASTRODUODENOSCOPY (EGD) WITH PROPOFOL BIOPSY  Patient Location: PACU  Anesthesia Type:MAC  Level of Consciousness: awake  Airway & Oxygen Therapy: Patient Spontanous Breathing  Post-op Assessment: Report given to RN  Post vital signs: stable  Last Vitals:  Vitals Value Taken Time  BP    Temp    Pulse    Resp    SpO2      Last Pain:  Vitals:   10/30/20 1353  TempSrc: Temporal  PainSc: 0-No pain      Patients Stated Pain Goal: 0 (29/19/16 6060)  Complications: No notable events documented.

## 2020-10-30 NOTE — Anesthesia Postprocedure Evaluation (Signed)
Anesthesia Post Note  Patient: Patricia English  Procedure(s) Performed: ESOPHAGOGASTRODUODENOSCOPY (EGD) WITH PROPOFOL BIOPSY     Patient location during evaluation: Endoscopy Anesthesia Type: MAC Level of consciousness: awake and alert Pain management: pain level controlled Vital Signs Assessment: post-procedure vital signs reviewed and stable Respiratory status: spontaneous breathing, nonlabored ventilation and respiratory function stable Cardiovascular status: stable and blood pressure returned to baseline Postop Assessment: no apparent nausea or vomiting Anesthetic complications: no   No notable events documented.  Last Vitals:  Vitals:   10/30/20 1620 10/30/20 1632  BP: (!) 142/88 (!) 142/88  Pulse: 91 91  Resp: 20   Temp: 37.1 C 37.1 C  SpO2:  100%    Last Pain:  Vitals:   10/30/20 1632  TempSrc: Oral  PainSc: 0-No pain                 Catalina Gravel

## 2020-10-31 ENCOUNTER — Inpatient Hospital Stay (HOSPITAL_COMMUNITY): Payer: Medicare Other | Admitting: Certified Registered Nurse Anesthetist

## 2020-10-31 ENCOUNTER — Encounter (HOSPITAL_COMMUNITY)
Admission: AD | Disposition: A | Discharge status: Home / Self Care | Payer: Self-pay | Source: Ambulatory Visit | Attending: Cardiovascular Disease

## 2020-10-31 ENCOUNTER — Encounter (HOSPITAL_COMMUNITY): Payer: Self-pay | Admitting: Internal Medicine

## 2020-10-31 DIAGNOSIS — K635 Polyp of colon: Secondary | ICD-10-CM

## 2020-10-31 DIAGNOSIS — K626 Ulcer of anus and rectum: Secondary | ICD-10-CM

## 2020-10-31 HISTORY — PX: COLONOSCOPY WITH PROPOFOL: SHX5780

## 2020-10-31 HISTORY — PX: POLYPECTOMY: SHX5525

## 2020-10-31 HISTORY — PX: BIOPSY: SHX5522

## 2020-10-31 HISTORY — PX: HEMOSTASIS CLIP PLACEMENT: SHX6857

## 2020-10-31 LAB — CBC WITH DIFFERENTIAL/PLATELET
Abs Immature Granulocytes: 0.08 10*3/uL — ABNORMAL HIGH (ref 0.00–0.07)
Basophils Absolute: 0 10*3/uL (ref 0.0–0.1)
Basophils Relative: 0 %
Eosinophils Absolute: 0.3 10*3/uL (ref 0.0–0.5)
Eosinophils Relative: 3 %
HCT: 33.1 % — ABNORMAL LOW (ref 36.0–46.0)
Hemoglobin: 10.8 g/dL — ABNORMAL LOW (ref 12.0–15.0)
Immature Granulocytes: 1 %
Lymphocytes Relative: 22 %
Lymphs Abs: 2.2 10*3/uL (ref 0.7–4.0)
MCH: 29.8 pg (ref 26.0–34.0)
MCHC: 32.6 g/dL (ref 30.0–36.0)
MCV: 91.2 fL (ref 80.0–100.0)
Monocytes Absolute: 1 10*3/uL (ref 0.1–1.0)
Monocytes Relative: 10 %
Neutro Abs: 6.6 10*3/uL (ref 1.7–7.7)
Neutrophils Relative %: 64 %
Platelets: 233 10*3/uL (ref 150–400)
RBC: 3.63 MIL/uL — ABNORMAL LOW (ref 3.87–5.11)
RDW: 13.2 % (ref 11.5–15.5)
WBC: 10.2 10*3/uL (ref 4.0–10.5)
nRBC: 0 % (ref 0.0–0.2)

## 2020-10-31 LAB — BASIC METABOLIC PANEL
Anion gap: 10 (ref 5–15)
BUN: 6 mg/dL — ABNORMAL LOW (ref 8–23)
CO2: 20 mmol/L — ABNORMAL LOW (ref 22–32)
Calcium: 8.9 mg/dL (ref 8.9–10.3)
Chloride: 113 mmol/L — ABNORMAL HIGH (ref 98–111)
Creatinine, Ser: 1.03 mg/dL — ABNORMAL HIGH (ref 0.44–1.00)
GFR, Estimated: 52 mL/min — ABNORMAL LOW (ref >60)
Glucose, Bld: 122 mg/dL — ABNORMAL HIGH (ref 70–99)
Potassium: 4.2 mmol/L (ref 3.5–5.1)
Sodium: 143 mmol/L (ref 135–145)

## 2020-10-31 LAB — GLUCOSE, CAPILLARY: Glucose-Capillary: 102 mg/dL — ABNORMAL HIGH (ref 70–99)

## 2020-10-31 SURGERY — COLONOSCOPY WITH PROPOFOL
Anesthesia: Monitor Anesthesia Care

## 2020-10-31 MED ORDER — LABETALOL HCL 5 MG/ML IV SOLN
INTRAVENOUS | Status: DC | PRN
  Administered 2020-10-31 (×4): 5 mg via INTRAVENOUS

## 2020-10-31 MED ORDER — LIDOCAINE 2% (20 MG/ML) 5 ML SYRINGE
INTRAMUSCULAR | Status: DC | PRN
  Administered 2020-10-31: 40 mg via INTRAVENOUS

## 2020-10-31 MED ORDER — METFORMIN HCL 500 MG PO TABS
250.0000 mg | ORAL_TABLET | Freq: Two times a day (BID) | ORAL | Status: DC
  Administered 2020-11-01: 250 mg via ORAL
  Filled 2020-10-31: qty 1

## 2020-10-31 MED ORDER — PROPOFOL 500 MG/50ML IV EMUL
INTRAVENOUS | Status: DC | PRN
  Administered 2020-10-31: 125 ug/kg/min via INTRAVENOUS

## 2020-10-31 MED ORDER — DILTIAZEM HCL ER COATED BEADS 120 MG PO CP24
120.0000 mg | ORAL_CAPSULE | Freq: Every day | ORAL | Status: DC
  Administered 2020-11-01: 120 mg via ORAL
  Filled 2020-10-31: qty 1

## 2020-10-31 MED ORDER — PROPOFOL 10 MG/ML IV BOLUS
INTRAVENOUS | Status: DC | PRN
  Administered 2020-10-31: 20 mg via INTRAVENOUS

## 2020-10-31 MED ORDER — PHENYLEPHRINE 40 MCG/ML (10ML) SYRINGE FOR IV PUSH (FOR BLOOD PRESSURE SUPPORT)
PREFILLED_SYRINGE | INTRAVENOUS | Status: DC | PRN
  Administered 2020-10-31: 80 ug via INTRAVENOUS

## 2020-10-31 MED ORDER — LACTATED RINGERS IV SOLN: INTRAVENOUS | Status: DC | PRN

## 2020-10-31 MED ORDER — METOPROLOL TARTRATE 25 MG PO TABS
25.0000 mg | ORAL_TABLET | Freq: Two times a day (BID) | ORAL | Status: DC
  Administered 2020-10-31 – 2020-11-01 (×2): 25 mg via ORAL
  Filled 2020-10-31 (×2): qty 1

## 2020-10-31 SURGICAL SUPPLY — 21 items

## 2020-10-31 NOTE — Transfer of Care (Signed)
Immediate Anesthesia Transfer of Care Note  Patient: Andersyn Fragoso  Procedure(s) Performed: COLONOSCOPY WITH PROPOFOL POLYPECTOMY HEMOSTASIS CLIP PLACEMENT BIOPSY  Patient Location: PACU  Anesthesia Type:MAC  Level of Consciousness: sedated  Airway & Oxygen Therapy: Patient Spontanous Breathing and Patient connected to nasal cannula oxygen  Post-op Assessment: Report given to RN and Post -op Vital signs reviewed and stable  Post vital signs: Reviewed and stable  Last Vitals:  Vitals Value Taken Time  BP 99/56 10/31/20 1533  Temp 36.5 C 10/31/20 1533  Pulse 84 10/31/20 1535  Resp 25 10/31/20 1535  SpO2 100 % 10/31/20 1535  Vitals shown include unvalidated device data.  Last Pain:  Vitals:   10/31/20 1533  TempSrc:   PainSc: 0-No pain      Patients Stated Pain Goal: 0 (21/97/58 8325)  Complications: No notable events documented.

## 2020-10-31 NOTE — Anesthesia Postprocedure Evaluation (Signed)
Anesthesia Post Note  Patient: Corrinna Karapetyan  Procedure(s) Performed: COLONOSCOPY WITH PROPOFOL POLYPECTOMY HEMOSTASIS CLIP PLACEMENT BIOPSY     Patient location during evaluation: Endoscopy Anesthesia Type: MAC Level of consciousness: awake and alert Pain management: pain level controlled Vital Signs Assessment: post-procedure vital signs reviewed and stable Respiratory status: spontaneous breathing, nonlabored ventilation, respiratory function stable and patient connected to nasal cannula oxygen Cardiovascular status: stable and blood pressure returned to baseline Postop Assessment: no apparent nausea or vomiting Anesthetic complications: no   No notable events documented.  Last Vitals:  Vitals:   10/31/20 1548 10/31/20 1603  BP: 115/83 (!) 126/91  Pulse: 90 87  Resp: 20 20  Temp:  36.6 C  SpO2: 99% 100%    Last Pain:  Vitals:   10/31/20 1603  TempSrc:   PainSc: 0-No pain                 Belenda Cruise P Wilhelmenia Addis

## 2020-10-31 NOTE — Op Note (Signed)
[facility] Patient Name: Patricia English Procedure Date : 10/31/2020 MRN: [id number] Attending MD: Georgian Co ,  Date of Birth: Sep 07, 1927 CSN: [id number] Age: 87 Admit Type: Inpatient Procedure:                Colonoscopy Indications:              Hematochezia Providers:                Adline Mango" Marlynn Perking, RN, Laverda Sorenson, Technician, Clearnce Sorrel, CRNA Referring MD:             [name] Medicines:                Monitored Anesthesia Care Complications:            No immediate complications. Estimated Blood Loss:     Estimated blood loss was minimal. Procedure:                Pre-Anesthesia Assessment:                           - Prior to the procedure, a History and Physical                            was performed, and patient medications and                            allergies were reviewed. The patient is competent.                            The risks and benefits of the procedure and the                            sedation options and risks were discussed with the                            patient. All questions were answered and informed                            consent was obtained. Patient identification and                            proposed procedure were verified by the physician                            in the pre-procedure area. Mental Status                            Examination: normal. Prophylactic Antibiotics: The                            patient does not require prophylactic antibiotics.                            Prior Anticoagulants:  The patient has taken no                            previous anticoagulant or antiplatelet agents. ASA                            Grade Assessment: III - A patient with severe                            systemic disease. After reviewing the risks and                            benefits, the patient was deemed in satisfactory                             condition to undergo the procedure. The anesthesia                            plan was to use monitored anesthesia care (MAC).                            Immediately prior to administration of medications,                            the patient was re-assessed for adequacy to receive                            sedatives. The heart rate, respiratory rate, oxygen                            saturations, blood pressure, adequacy of pulmonary                            ventilation, and response to care were monitored                            throughout the procedure. The physical status of                            the patient was re-assessed after the procedure.                           After obtaining informed consent, the colonoscope                            was passed under direct vision. Throughout the                            procedure, the patient's blood pressure, pulse, and                            oxygen saturations were monitored continuously. The  [id number]-[id number] ([id number] Olympus peds colonoscope was                            introduced through the anus and advanced to the the                            terminal ileum. The quality of the bowel                            preparation was inadequate. The colonoscopy was                            performed without difficulty. The patient tolerated                            the procedure well. Scope In: 2:49:37 PM Scope Out: 3:23:12 PM Scope Withdrawal Time: 0 hours 28 minutes 8 seconds  Total Procedure Duration: 0 hours 33 minutes 35 seconds  Findings:      The terminal ileum appeared normal.      Semi-solid stool was found in the entire colon, making visualization       difficult.      Multiple small and large-mouthed diverticula were found in the sigmoid       colon, descending colon, transverse colon and ascending colon.      Three semi-pedunculated polyps were found in the descending colon,        transverse colon and ascending colon. The polyp in the ascending colon       had some overlying exudate/ulceration. The polyps were 4 to 8 mm in       size. These polyps were removed with a cold snare. Resection and       retrieval were complete. To prevent bleeding after the polypectomy, two       hemostatic clips were successfully placed at the polypectomy site of the       descending colon. There was no bleeding at the end of the procedure.      A few ulcers were found in the rectum. No bleeding was present. Biopsies       were taken with a cold forceps for histology.      Non-bleeding internal hemorrhoids were found during retroflexion and       during perianal exam. Rectal prolapse was noted. Impression:               - Preparation of the colon was inadequate.                           - The examined portion of the ileum was normal.                           - Stool in the entire examined colon.                           - Diverticulosis in the sigmoid colon, in the                            descending colon, in the transverse colon and in  the ascending colon.                           - Three 4 to 8 mm polyps in the descending colon,                            in the transverse colon and in the ascending colon,                            removed with a cold snare. Resected and retrieved.                            Clips were placed.                           - A few ulcers in the rectum, suspected to be due                            to rectal prolapse. Biopsied.                           - Non-bleeding internal hemorrhoids. Rectal                            prolapse. Recommendation:           - Return patient to hospital ward for ongoing care.                           - No signs of active bleeding. It is suspected that                            the patient had bleeding from an inflamed polyp                            that was removed, diverticular  bleeding that has                            resolved, or hemorrhoidal bleeding.                           - Await pathology results.                           - The findings and recommendations were discussed                            with the patient and/or the primary team. Procedure Code(s):        --- Professional ---                           [phone], Colonoscopy, flexible; with removal of                            tumor(s), polyp(s), or other lesion(s) by snare  technique                           45380, 59, Colonoscopy, flexible; with biopsy,                            single or multiple Diagnosis Code(s):        --- Professional ---                           K64.8, Other hemorrhoids                           K63.5, Polyp of colon                           K62.6, Ulcer of anus and rectum                           K92.1, Melena (includes Hematochezia)                           K57.30, Diverticulosis of large intestine without                            perforation or abscess without bleeding CPT copyright 2019 American Medical Association. All rights reserved. The codes documented in this report are preliminary and upon coder review may  be revised to meet current compliance requirements. Sonny Masters "Christia Reading,  10/31/2020 3:46:45 PM Number of Addenda: 0

## 2020-10-31 NOTE — Interval H&P Note (Signed)
History and Physical Interval Note:  10/31/2020 2:41 PM  Patricia English  has presented today for surgery, with the diagnosis of gastrointestinal bleeding.  The various methods of treatment have been discussed with the patient and family. After consideration of risks, benefits and other options for treatment, the patient has consented to  Procedure(s): COLONOSCOPY WITH PROPOFOL (N/A) as a surgical intervention.  The patient's history has been reviewed, patient examined, no change in status, stable for surgery.  I have reviewed the patient's chart and labs.  Questions were answered to the patient's satisfaction.     Sharyn Creamer

## 2020-10-31 NOTE — Anesthesia Preprocedure Evaluation (Signed)
Anesthesia Evaluation  Patient identified by MRN, date of birth, ID band Patient awake    Airway Mallampati: II  TM Distance: >3 FB Neck ROM: Full    Dental  (+) Poor Dentition   Pulmonary neg pulmonary ROS,    Pulmonary exam normal        Cardiovascular hypertension, Pt. on medications and Pt. on home beta blockers + CAD  + dysrhythmias Atrial Fibrillation  Rhythm:Irregular Rate:Tachycardia     Neuro/Psych negative neurological ROS  negative psych ROS   GI/Hepatic Neg liver ROS, GIB   Endo/Other  diabetes, Type 2, Oral Hypoglycemic Agents  Renal/GU negative Renal ROS  negative genitourinary   Musculoskeletal negative musculoskeletal ROS (+)   Abdominal (+)  Abdomen: soft.    Peds  Hematology  (+) anemia ,   Anesthesia Other Findings   Reproductive/Obstetrics                             Anesthesia Physical Anesthesia Plan  ASA: 3  Anesthesia Plan: MAC   Post-op Pain Management:    Induction: Intravenous  PONV Risk Score and Plan: 2 and Treatment may vary due to age or medical condition and Propofol infusion  Airway Management Planned: Simple Face Mask, Natural Airway and Nasal Cannula  Additional Equipment: None  Intra-op Plan:   Post-operative Plan:   Informed Consent: I have reviewed the patients History and Physical, chart, labs and discussed the procedure including the risks, benefits and alternatives for the proposed anesthesia with the patient or authorized representative who has indicated his/her understanding and acceptance.     Dental advisory given  Plan Discussed with: CRNA  Anesthesia Plan Comments:         Anesthesia Quick Evaluation

## 2020-10-31 NOTE — Progress Notes (Signed)
Ref: [name], MD   Subjective:  Awake. Awaiting colonoscopy. EGD was unremarkable yesterday.  Objective:  Vital Signs in the last 24 hours: Temp:  [96.3 F (35.7 C)-98.8 F (37.1 C)] 98.3 F (36.8 C) (09/17 1100) Pulse Rate:  [90-113] 113 (09/17 1100) Cardiac Rhythm: Atrial fibrillation;Atrial flutter;Other (Comment) (09/17 0700) Resp:  [18-35] 20 (09/17 1100) BP: (118-174)/(80-103) 150/89 (09/17 1100) SpO2:  [100 %] 100 % (09/17 1100) Weight:  [76.6 kg] 76.6 kg (09/17 0310)  Physical Exam: BP Readings from Last 1 Encounters:  10/31/20 (!) 150/89     Wt Readings from Last 1 Encounters:  10/31/20 76.6 kg    Weight change: -0.058 kg Body mass index is 26.45 kg/m. HEENT: [place]/AT, Eyes-Brown, Conjunctiva-Pale pink, Sclera-Non-icteric Neck: No JVD, No bruit, Trachea midline. Lungs:  Clear, Bilateral. Cardiac:  Regular rhythm, normal S1 and S2, no S3. II/VI systolic murmur. Abdomen:  Soft, non-tender. BS present. Extremities:  No edema present. No cyanosis. No clubbing. CNS: AxOx3, Cranial nerves grossly intact, moves all 4 extremities.  Skin: Warm and dry.   Intake/Output from previous day: 09/16 0701 - 09/17 0700 In: 2101.3 [P.O.:720; I.V.:1381.3] Out: 250 [Urine:250]    Lab Results: BMET    Component Value Date/Time   NA 143 10/31/2020 0437   NA 142 10/30/2020 0410   NA 142 10/29/2020 0425   K 4.2 10/31/2020 0437   K 3.8 10/30/2020 0410   K 3.4 (L) 10/29/2020 0425   CL 113 (H) 10/31/2020 0437   CL 108 10/30/2020 0410   CL 110 10/29/2020 0425   CO2 20 (L) 10/31/2020 0437   CO2 26 10/30/2020 0410   CO2 24 10/29/2020 0425   GLUCOSE 122 (H) 10/31/2020 0437   GLUCOSE 113 (H) 10/30/2020 0410   GLUCOSE 79 10/29/2020 0425   BUN 6 (L) 10/31/2020 0437   BUN 8 10/30/2020 0410   BUN 15 10/29/2020 0425   CREATININE 1.03 (H) 10/31/2020 0437   CREATININE 1.03 (H) 10/30/2020 0410   CREATININE 1.20 (H) 10/29/2020 0425   CALCIUM 8.9 10/31/2020 0437   CALCIUM 8.9  10/30/2020 0410   CALCIUM 8.6 (L) 10/29/2020 0425   GFRNONAA 52 (L) 10/31/2020 0437   GFRNONAA 52 (L) 10/30/2020 0410   GFRNONAA 43 (L) 10/29/2020 0425   GFRAA 58 (L) 12/10/2017 0015   GFRAA >60 12/09/2017 0424   GFRAA 57 (L) 12/08/2017 0758   CBC    Component Value Date/Time   WBC 10.2 10/31/2020 0437   RBC 3.63 (L) 10/31/2020 0437   HGB 10.8 (L) 10/31/2020 0437   HCT 33.1 (L) 10/31/2020 0437   PLT 233 10/31/2020 0437   MCV 91.2 10/31/2020 0437   MCH 29.8 10/31/2020 0437   MCHC 32.6 10/31/2020 0437   RDW 13.2 10/31/2020 0437   LYMPHSABS 2.2 10/31/2020 0437   MONOABS 1.0 10/31/2020 0437   EOSABS 0.3 10/31/2020 0437   BASOSABS 0.0 10/31/2020 0437   HEPATIC Function Panel Recent Labs    10/28/20 1816  PROT 7.0   HEMOGLOBIN A1C No components found for: HGA1C,  MPG CARDIAC ENZYMES Lab Results  Component Value Date   TROPONINI <0.30 11/22/2013   TROPONINI <0.30 10/13/2012   BNP No results for input(s): PROBNP in the last 8760 hours. TSH No results for input(s): TSH in the last 8760 hours. CHOLESTEROL No results for input(s): CHOL in the last 8760 hours.  Scheduled Meds:  cholecalciferol  1,000 Units Oral Daily   pantoprazole (PROTONIX) IV  40 mg Intravenous Q12H   pravastatin  40 mg Oral q1800   sodium chloride flush  3 mL Intravenous Q12H   Continuous Infusions:  sodium chloride     0.9 % NaCl with KCl 40 mEq / L 75 mL/hr at 10/31/20 0554   PRN Meds:.sodium chloride, acetaminophen, ondansetron (ZOFRAN) IV, sodium chloride flush  Assessment/Plan: Acute GI bleed HTN Type 2 DM CAD MR  TR  Plan: Awaiting colonoscopy today. Decrease IV fluids.   LOS: 3 days   Time spent including chart review, lab review, examination, discussion with patient/Nurse/Doctor : 30 min   [name]  MD  10/31/2020, 1:20 PM

## 2020-11-01 ENCOUNTER — Encounter (HOSPITAL_COMMUNITY): Payer: Self-pay | Admitting: Internal Medicine

## 2020-11-01 LAB — CBC WITH DIFFERENTIAL/PLATELET
Abs Immature Granulocytes: 0.06 10*3/uL (ref 0.00–0.07)
Basophils Absolute: 0 10*3/uL (ref 0.0–0.1)
Basophils Relative: 0 %
Eosinophils Absolute: 0.2 10*3/uL (ref 0.0–0.5)
Eosinophils Relative: 2 %
HCT: 28.3 % — ABNORMAL LOW (ref 36.0–46.0)
Hemoglobin: 9.4 g/dL — ABNORMAL LOW (ref 12.0–15.0)
Immature Granulocytes: 1 %
Lymphocytes Relative: 24 %
Lymphs Abs: 2.4 10*3/uL (ref 0.7–4.0)
MCH: 30.2 pg (ref 26.0–34.0)
MCHC: 33.2 g/dL (ref 30.0–36.0)
MCV: 91 fL (ref 80.0–100.0)
Monocytes Absolute: 0.9 10*3/uL (ref 0.1–1.0)
Monocytes Relative: 9 %
Neutro Abs: 6.4 10*3/uL (ref 1.7–7.7)
Neutrophils Relative %: 64 %
Platelets: 209 10*3/uL (ref 150–400)
RBC: 3.11 MIL/uL — ABNORMAL LOW (ref 3.87–5.11)
RDW: 13.4 % (ref 11.5–15.5)
WBC: 10 10*3/uL (ref 4.0–10.5)
nRBC: 0 % (ref 0.0–0.2)

## 2020-11-01 MED ORDER — PANTOPRAZOLE SODIUM 40 MG PO TBEC
40.0000 mg | DELAYED_RELEASE_TABLET | Freq: Two times a day (BID) | ORAL | 3 refills | Status: DC
Start: 2020-11-01 — End: 2021-07-06

## 2020-11-01 MED ORDER — PANTOPRAZOLE SODIUM 40 MG PO TBEC
40.0000 mg | DELAYED_RELEASE_TABLET | Freq: Two times a day (BID) | ORAL | Status: DC
  Filled 2020-11-01: qty 1

## 2020-11-01 MED ORDER — ISOSORBIDE MONONITRATE ER 30 MG PO TB24: 30.0000 mg | ORAL_TABLET | Freq: Every evening | ORAL | Status: DC

## 2020-11-01 MED ORDER — DILTIAZEM HCL ER COATED BEADS 180 MG PO CP24: 180.0000 mg | ORAL_CAPSULE | Freq: Every day | ORAL | Status: DC

## 2020-11-01 MED ORDER — FUROSEMIDE 40 MG PO TABS: 40.0000 mg | ORAL_TABLET | ORAL | Status: DC

## 2020-11-01 MED ORDER — DILTIAZEM HCL ER 180 MG PO CP24: 180.0000 mg | ORAL_CAPSULE | Freq: Every day | ORAL | Status: DC

## 2020-11-01 NOTE — Progress Notes (Signed)
Pt has orders to be discharged. Discharge instructions given and pt has no additional questions at this time. Medication regimen reviewed and pt educated. Pt verbalized understanding and has no additional questions. Telemetry box removed. IV removed and site in good condition. Pt stable and waiting for transportation. 

## 2020-11-01 NOTE — Discharge Summary (Signed)
Physician Discharge Summary  Patient ID: Patricia English MRN: [id number] DOB/AGE: 1928-01-15 87 y.o.  Admit date: 10/28/2020 Discharge date: 11/01/2020  Admission Diagnoses: Acute GI bleeding Dizziness from above HTN Type 2 DM CAD  Discharge Diagnoses:  Principal Problem:   Acute GI bleeding Active Problems:   Weakness   DM2 (diabetes mellitus, type 2) (HCC)   Chronic atrial fibrillation   Hypertension, essential   CAD   Dizziness, resolved   Anemia of acute and chronic blood loss   Colonic diverticulosis, multiple   Multiple colonic polyps   CKD, II    Discharged Condition: fair  Hospital Course: 87 years old black female with PMH of HTN, type 2 DM, CAD and anemia of chronic blood loss had dark stools and dizziness without chest pain or palpitations. She had significant blood loss hence ahe underwent EGD, followed by colonoscopy. EGD was unremarkable. Colonoscopy was positive for diverticuloses and polyps. She has chronic atrial fibrillation but not a candidate for anticoagulation. She was discharged home in stable with f/u by me in 4-5 days.  Consults: GI  Significant Diagnostic Studies: labs: Hgb 9.4 gm. Normal WBC and platelets count. Normal S. Iron and ferritin.  Normal electrolytes, glucose mildly elevated and potassium borderline low at 3.4 mmol. Creatinine was 1.43 on admission and 1.03 on discharge.  EKG: Atrial fibrillation and low voltage, old anterior and inferior infarcts.  Chest x-ray: Unremarkable.  Echocardiogram: Normal LV systolic function, mild LVH, mild MR and mild to moderate TR.  EGD: Unremarkable  Colonoscopy: Multiple diverticulosis and polyps, non-bleeding internal hemorrhoids with rectal prolapse.  Treatments: Protonix bid, IV fluids, EGD and Colonoscopy.  Discharge Exam: Blood pressure (!) 143/97, pulse 72, temperature 97.9 F (36.6 C), temperature source Oral, resp. rate 20, height '5\' 7"'$  (1.702 m), weight 77 kg, SpO2 100 %. General  appearance: alert, cooperative and appears stated age. Head: Normocephalic, atraumatic. Eyes: Brown eyes, pale pink conjunctiva, corneas clear.   Neck: No adenopathy, no carotid bruit, no JVD, supple, symmetrical, trachea midline and thyroid not enlarged. Resp: Clear to auscultation bilaterally. Cardio: Irregular rate and rhythm, S1, S2 normal, II/VI systolic murmur, no click, rub or gallop. GI: Soft, non-tender; bowel sounds normal; no organomegaly. Extremities: No edema, cyanosis or clubbing. Skin: Warm and dry.  Neurologic: Alert and oriented X 3, normal strength and tone. Normal coordination and slow gait.  Disposition: Discharge disposition: 01-Home or Self Care        Allergies as of 11/01/2020       Reactions   Lisinopril    This caused laryngeal edema        Medication List     STOP taking these medications    digoxin 0.25 MG tablet Commonly known as: LANOXIN       TAKE these medications    Cholecalciferol 25 MCG (1000 UT) tablet Take 1,000 Units by mouth daily.   Coenzyme Q10 100 MG Tabs Take 100 mg by mouth daily.   diltiazem 180 MG 24 hr capsule Commonly known as: DILACOR XR Take 1 capsule (180 mg total) by mouth daily. What changed: when to take this   furosemide 40 MG tablet Commonly known as: LASIX Take 1 tablet (40 mg total) by mouth every Monday, Wednesday, and Friday. Start taking on: November 02, 2020 What changed: when to take this   glipiZIDE 5 MG 24 hr tablet Commonly known as: GLUCOTROL XL Take 5 mg by mouth every evening.   isosorbide mononitrate 30 MG 24 hr tablet Commonly known as:  IMDUR Take 30 mg by mouth every evening.   metFORMIN 500 MG tablet Commonly known as: GLUCOPHAGE Take 250 mg by mouth 2 (two) times daily with a meal.   metoprolol tartrate 50 MG tablet Commonly known as: LOPRESSOR Take 25 mg by mouth 2 (two) times daily.   pantoprazole 40 MG tablet Commonly known as: PROTONIX Take 1 tablet (40 mg total)  by mouth 2 (two) times daily.   pravastatin 40 MG tablet Commonly known as: PRAVACHOL Take 40 mg by mouth daily.        Follow-up Information     [name], MD. Schedule an appointment as soon as possible for a visit in 5 day(s).   Specialty: Cardiology Contact information: [place] [address] [phone]                 Time spent: Review of old chart, current chart, lab, x-ray, cardiac tests and discussion with patient over 60 minutes.  Signed: Birdie Riddle 11/01/2020, 11:24 AM

## 2020-11-03 LAB — SURGICAL PATHOLOGY

## 2020-11-04 ENCOUNTER — Encounter: Payer: Self-pay | Admitting: Internal Medicine

## 2021-01-19 ENCOUNTER — Other Ambulatory Visit: Payer: Self-pay

## 2021-01-19 ENCOUNTER — Emergency Department (HOSPITAL_COMMUNITY)
Admission: EM | Admit: 2021-01-19 | Discharge: 2021-01-20 | Disposition: A | Discharge status: Home / Self Care | Payer: Medicare Other | Attending: Emergency Medicine | Admitting: Emergency Medicine

## 2021-01-19 ENCOUNTER — Emergency Department (HOSPITAL_COMMUNITY): Payer: Medicare Other

## 2021-01-19 ENCOUNTER — Encounter (HOSPITAL_COMMUNITY): Payer: Self-pay

## 2021-01-19 DIAGNOSIS — Z79899 Other long term (current) drug therapy: Secondary | ICD-10-CM | POA: Insufficient documentation

## 2021-01-19 DIAGNOSIS — Z7984 Long term (current) use of oral hypoglycemic drugs: Secondary | ICD-10-CM | POA: Insufficient documentation

## 2021-01-19 DIAGNOSIS — I10 Essential (primary) hypertension: Secondary | ICD-10-CM | POA: Insufficient documentation

## 2021-01-19 DIAGNOSIS — E119 Type 2 diabetes mellitus without complications: Secondary | ICD-10-CM | POA: Insufficient documentation

## 2021-01-19 DIAGNOSIS — I2510 Atherosclerotic heart disease of native coronary artery without angina pectoris: Secondary | ICD-10-CM | POA: Insufficient documentation

## 2021-01-19 DIAGNOSIS — R112 Nausea with vomiting, unspecified: Secondary | ICD-10-CM | POA: Diagnosis present

## 2021-01-19 DIAGNOSIS — R000 Tachycardia, unspecified: Secondary | ICD-10-CM | POA: Insufficient documentation

## 2021-01-19 DIAGNOSIS — N3001 Acute cystitis with hematuria: Secondary | ICD-10-CM | POA: Diagnosis not present

## 2021-01-19 DIAGNOSIS — Z20822 Contact with and (suspected) exposure to covid-19: Secondary | ICD-10-CM | POA: Insufficient documentation

## 2021-01-19 DIAGNOSIS — R1110 Vomiting, unspecified: Secondary | ICD-10-CM

## 2021-01-19 DIAGNOSIS — R791 Abnormal coagulation profile: Secondary | ICD-10-CM | POA: Diagnosis not present

## 2021-01-19 LAB — COMPREHENSIVE METABOLIC PANEL
ALT: 10 U/L (ref 0–44)
AST: 19 U/L (ref 15–41)
Albumin: 3.9 g/dL (ref 3.5–5.0)
Alkaline Phosphatase: 70 U/L (ref 38–126)
Anion gap: 13 (ref 5–15)
BUN: 22 mg/dL (ref 8–23)
CO2: 21 mmol/L — ABNORMAL LOW (ref 22–32)
Calcium: 8.8 mg/dL — ABNORMAL LOW (ref 8.9–10.3)
Chloride: 101 mmol/L (ref 98–111)
Creatinine, Ser: 1.46 mg/dL — ABNORMAL HIGH (ref 0.44–1.00)
GFR, Estimated: 34 mL/min — ABNORMAL LOW (ref >60)
Glucose, Bld: 76 mg/dL (ref 70–99)
Potassium: 3.7 mmol/L (ref 3.5–5.1)
Sodium: 135 mmol/L (ref 135–145)
Total Bilirubin: 1.2 mg/dL (ref 0.3–1.2)
Total Protein: 8.1 g/dL (ref 6.5–8.1)

## 2021-01-19 LAB — CBC WITH DIFFERENTIAL/PLATELET
Abs Immature Granulocytes: 0.06 10*3/uL (ref 0.00–0.07)
Basophils Absolute: 0.1 10*3/uL (ref 0.0–0.1)
Basophils Relative: 0 %
Eosinophils Absolute: 0 10*3/uL (ref 0.0–0.5)
Eosinophils Relative: 0 %
HCT: 39.9 % (ref 36.0–46.0)
Hemoglobin: 12.7 g/dL (ref 12.0–15.0)
Immature Granulocytes: 0 %
Lymphocytes Relative: 6 %
Lymphs Abs: 0.8 10*3/uL (ref 0.7–4.0)
MCH: 28.5 pg (ref 26.0–34.0)
MCHC: 31.8 g/dL (ref 30.0–36.0)
MCV: 89.5 fL (ref 80.0–100.0)
Monocytes Absolute: 0.9 10*3/uL (ref 0.1–1.0)
Monocytes Relative: 6 %
Neutro Abs: 12.3 10*3/uL — ABNORMAL HIGH (ref 1.7–7.7)
Neutrophils Relative %: 88 %
Platelets: 273 10*3/uL (ref 150–400)
RBC: 4.46 MIL/uL (ref 3.87–5.11)
RDW: 13.2 % (ref 11.5–15.5)
WBC: 14.1 10*3/uL — ABNORMAL HIGH (ref 4.0–10.5)
nRBC: 0 % (ref 0.0–0.2)

## 2021-01-19 LAB — URINALYSIS, MICROSCOPIC (REFLEX)

## 2021-01-19 LAB — APTT: aPTT: 30 seconds (ref 24–36)

## 2021-01-19 LAB — URINALYSIS, ROUTINE W REFLEX MICROSCOPIC
Bilirubin Urine: NEGATIVE
Glucose, UA: NEGATIVE mg/dL
Ketones, ur: NEGATIVE mg/dL
Nitrite: POSITIVE — AB
Protein, ur: NEGATIVE mg/dL
Specific Gravity, Urine: 1.01 (ref 1.005–1.030)
pH: 5.5 (ref 5.0–8.0)

## 2021-01-19 LAB — RESP PANEL BY RT-PCR (FLU A&B, COVID) ARPGX2
Influenza A by PCR: NEGATIVE
Influenza B by PCR: NEGATIVE
SARS Coronavirus 2 by RT PCR: NEGATIVE

## 2021-01-19 LAB — PROTIME-INR
INR: 1.2 (ref 0.8–1.2)
Prothrombin Time: 14.9 seconds (ref 11.4–15.2)

## 2021-01-19 LAB — LACTIC ACID, PLASMA
Lactic Acid, Venous: 3 mmol/L (ref 0.5–1.9)
Lactic Acid, Venous: 2.4 mmol/L (ref 0.5–1.9)

## 2021-01-19 IMAGING — CR DG CHEST 1V
1 series · 1 of 1 positions shown · non-contrast
Comparison: Chest x-ray [DATE].

CLINICAL DATA: Vomiting.

EXAM:
CHEST  1 VIEW

[chest pa]
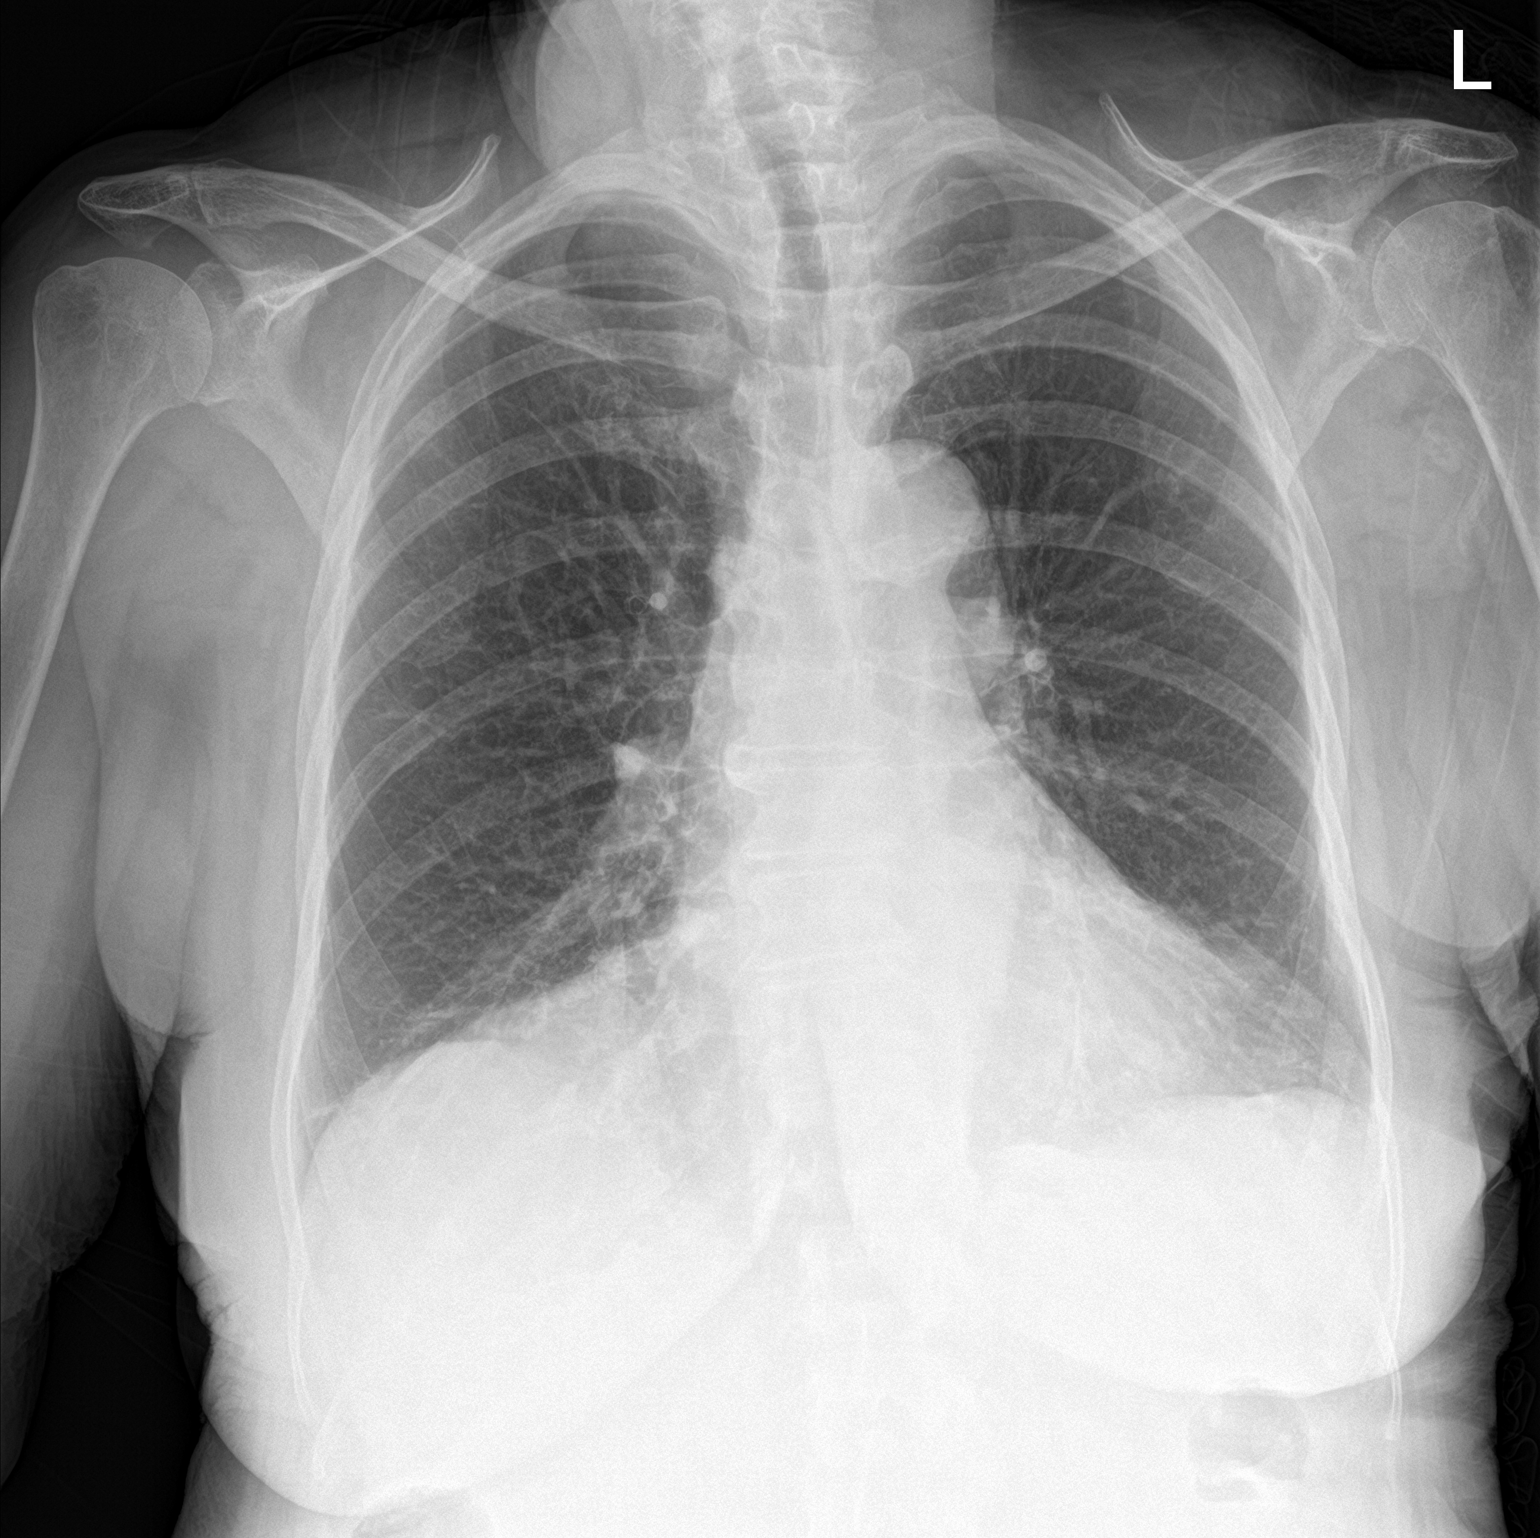

[1 of 1 positions shown; findings below may reference images not displayed]

FINDINGS: The heart size and mediastinal contours are within normal limits.
There is bibasilar atelectasis. Lungs are otherwise clear. There is
no pleural effusion or pneumothorax.
IMPRESSION: No active disease.

## 2021-01-19 IMAGING — CT CT ABD-PELV W/ CM
2 of 5 series · 16 of 46 positions shown, 18 images · IV contrast (omnipaque)
Comparison: None.

CLINICAL DATA: Abdominal pain and fever.

EXAM:
CT ABDOMEN AND PELVIS WITH CONTRAST
TECHNIQUE: Multidetector CT imaging of the abdomen and pelvis was performed
using the standard protocol following bolus administration of
intravenous contrast.
CONTRAST:  80mL OMNIPAQUE IOHEXOL 300 MG/ML  SOLN

[Series 3: a/p w/ 5mm · axial · 0.90mm/px · z∈[-512,-62]mm · 13 of 102 slices shown, 15 images]
[im 6/102  soft-tissue]
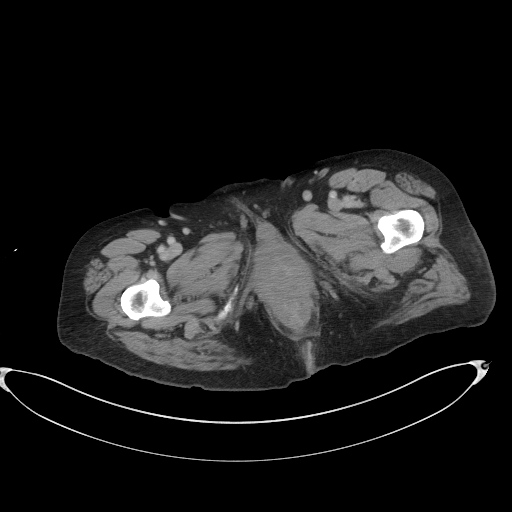
[im 6/102  bone]
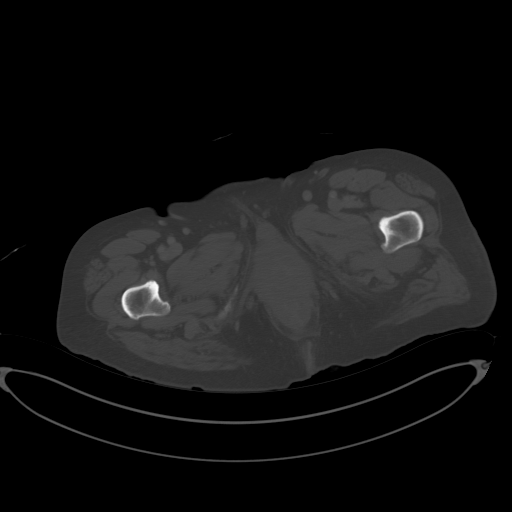
[im 16/102  soft-tissue]
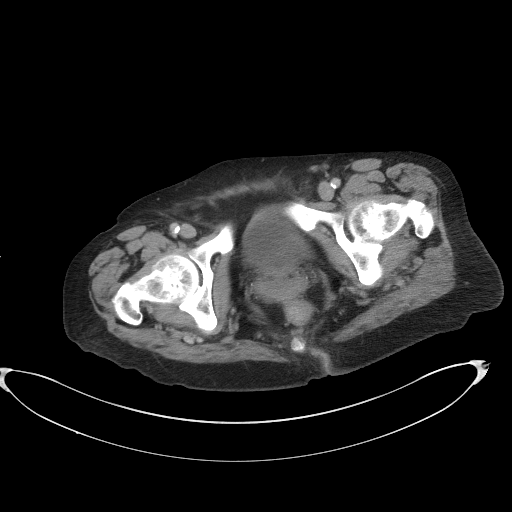
[im 22/102  soft-tissue]
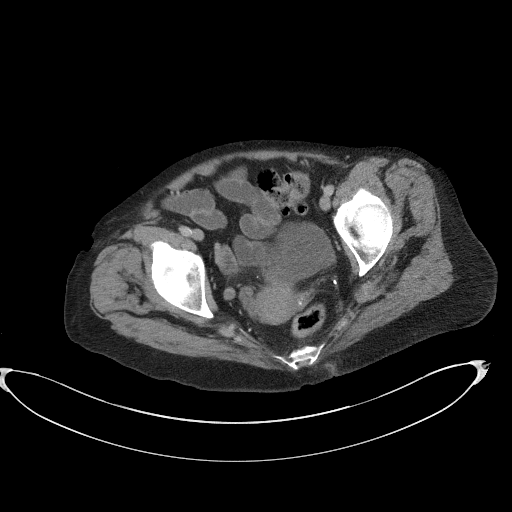
[im 27/102  soft-tissue]
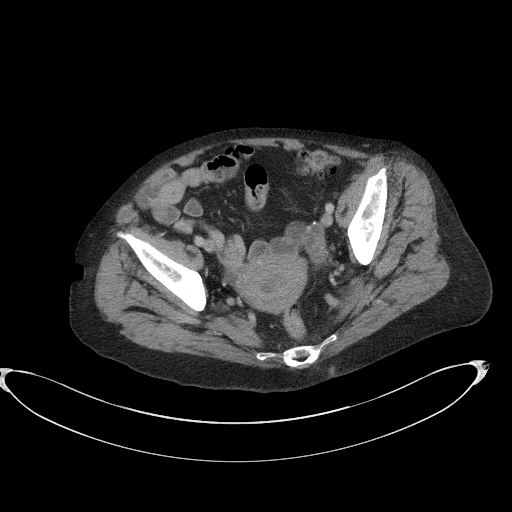
[im 38/102  soft-tissue]
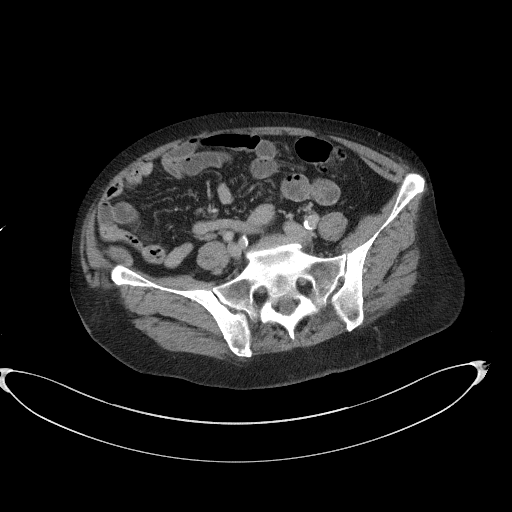
[im 43/102  soft-tissue]
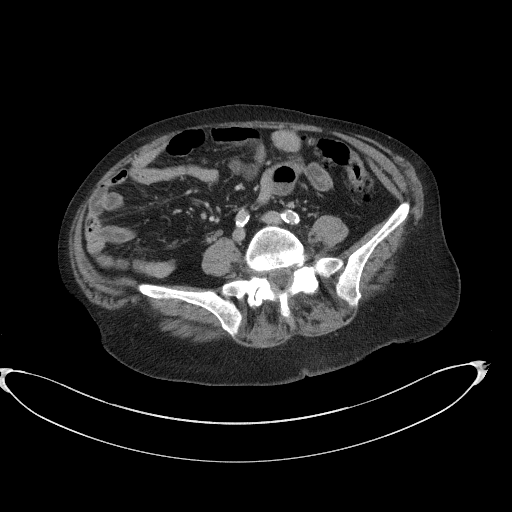
[im 54/102  soft-tissue]
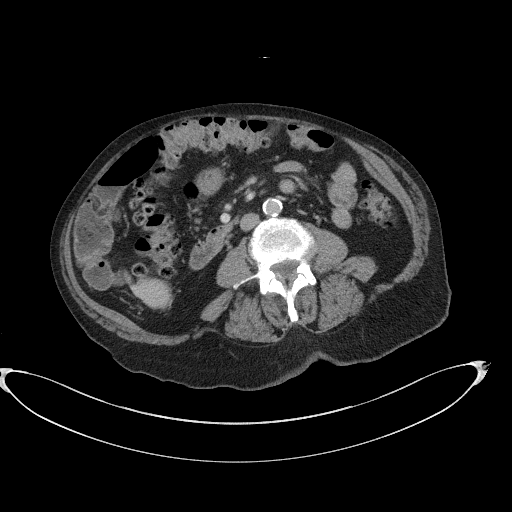
[im 59/102  soft-tissue]
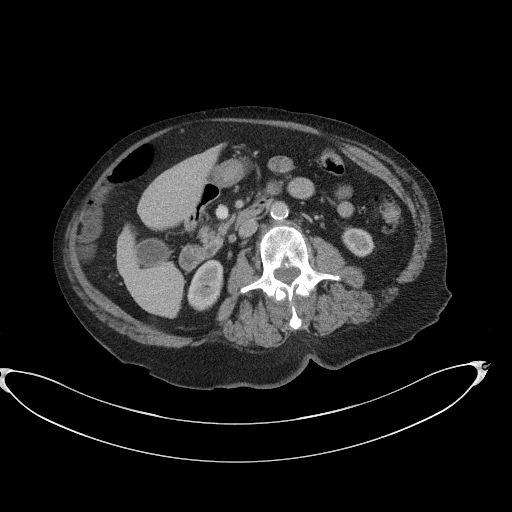
[im 64/102  soft-tissue]
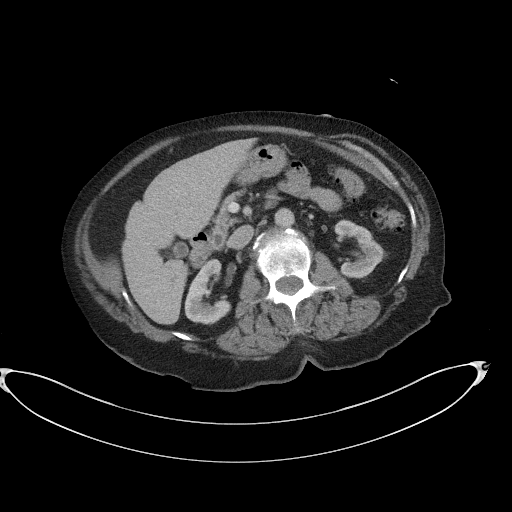
[im 64/102  bone]
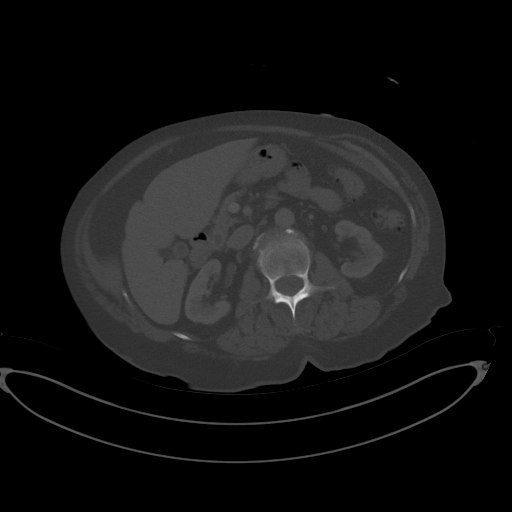
[im 75/102  soft-tissue]
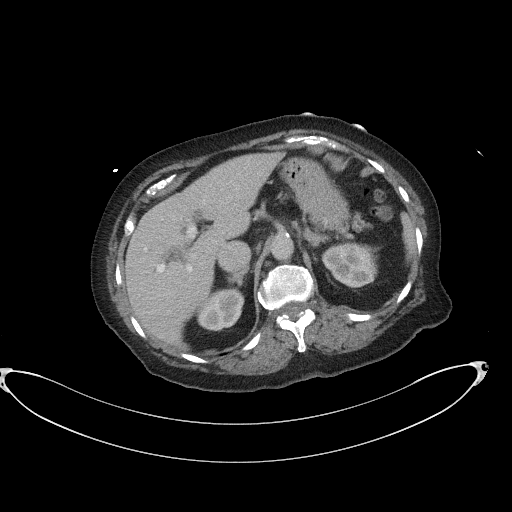
[im 80/102  soft-tissue]
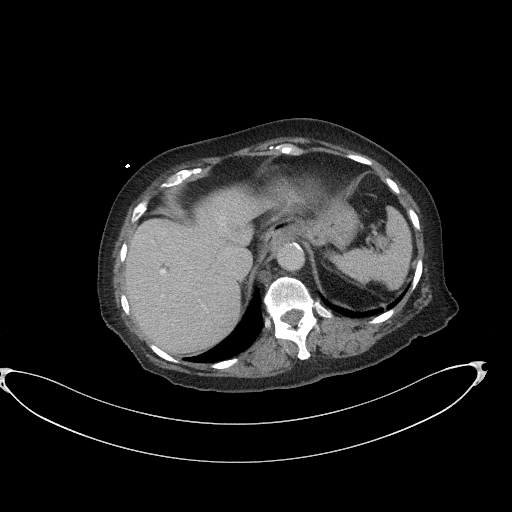
[im 86/102  soft-tissue]
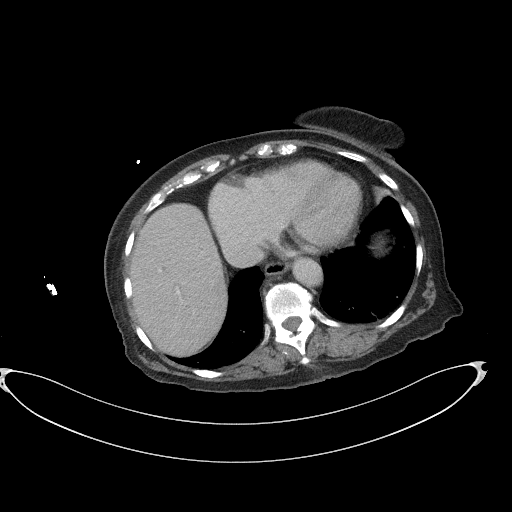
[im 96/102  soft-tissue]
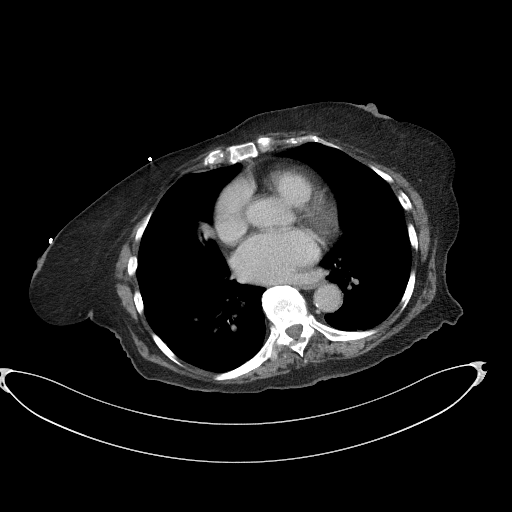

[Series 6: a/p w/ cor · coronal · 0.99mm/px · 3 of 149 slices shown]
[im 50/149  soft-tissue]
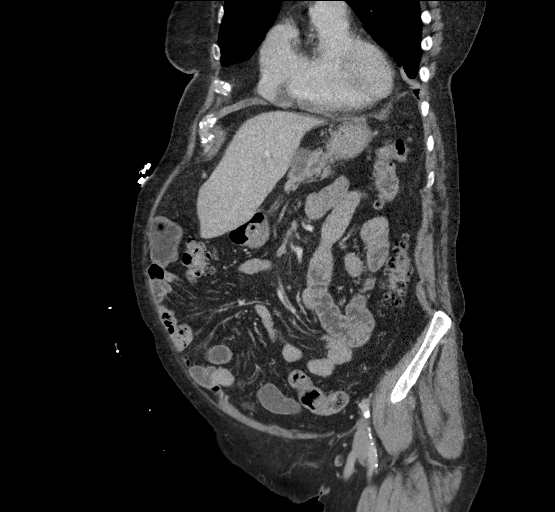
[im 66/149  soft-tissue]
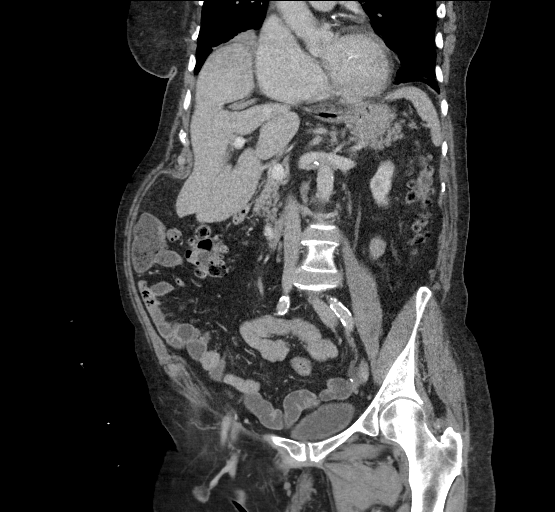
[im 83/149  soft-tissue]
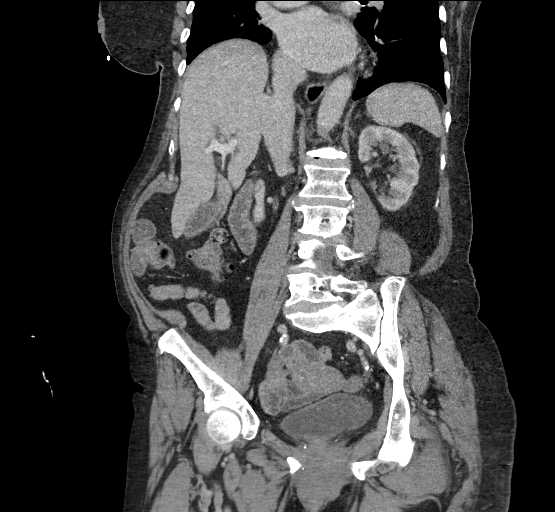

[16 of 46 positions shown; findings below may reference images not displayed]

FINDINGS: Lower chest: Bibasilar linear atelectasis/scarring. There is
coronary vascular calcification.

No intra-abdominal free air or free fluid.

Hepatobiliary: The liver is unremarkable. There is mild biliary
ductal dilatation. The gallbladder is unremarkable.

Pancreas: There is a 2.4 x 2.0 cm hypodense lesion in the region of
the neck of the pancreas which is not characterized on this CT but
may represent a side branch IPMN or a mucinous lesion. A malignancy
is not excluded. Further characterization with MRI without and with
contrast on a nonemergent/outpatient basis recommended. No active
inflammatory changes.

Spleen: Normal in size without focal abnormality.

Adrenals/Urinary Tract: The adrenal glands unremarkable. There is no
hydronephrosis on either side. There is symmetric enhancement and
excretion of contrast by both kidneys. Subcentimeter left renal
inferior pole hypodense lesion is not characterized. The visualized
ureters and urinary bladder appear unremarkable.

Stomach/Bowel: Severe diffuse colonic diverticulosis without active
inflammatory changes. There is no bowel obstruction or active
inflammation. The appendix is normal.

Vascular/Lymphatic: Advanced aortoiliac atherosclerotic disease. The
IVC is unremarkable. No portal venous gas. There is no adenopathy.

Reproductive: The uterus is anteverted. There is thickened
appearance of the endometrium measuring approximately 9 mm.
Underlying endometrial neoplasm is not excluded further evaluation
with ultrasound on a nonemergent/outpatient basis recommended. No
adnexal masses.

Other: None

Musculoskeletal: Degenerative changes of the spine. No acute osseous
pathology.
IMPRESSION: 1. No acute intra-abdominal or pelvic pathology.
2. Severe diffuse colonic diverticulosis. No bowel obstruction.
Normal appendix.
3. Thickened appearance of the endometrium. Further evaluation with
ultrasound on a nonemergent/outpatient basis recommended.
4. Indeterminate 2.4 x 2.0 cm hypodense lesion in the region of the
neck of the pancreas. Further characterization with pancreatic
protocol MRI on a nonemergent/outpatient basis recommended.
5. Aortic Atherosclerosis ([2E]-[2E]).

## 2021-01-19 MED ORDER — SODIUM CHLORIDE 0.9 % IV BOLUS
1000.0000 mL | Freq: Once | INTRAVENOUS | Status: AC
  Administered 2021-01-19: 1000 mL via INTRAVENOUS

## 2021-01-19 MED ORDER — SODIUM CHLORIDE 0.9 % IV SOLN
1.0000 g | Freq: Once | INTRAVENOUS | Status: AC
  Administered 2021-01-19: 1 g via INTRAVENOUS
  Filled 2021-01-19: qty 10

## 2021-01-19 MED ORDER — ACETAMINOPHEN 325 MG PO TABS
650.0000 mg | ORAL_TABLET | Freq: Once | ORAL | Status: AC
  Administered 2021-01-19: 650 mg via ORAL
  Filled 2021-01-19: qty 2

## 2021-01-19 MED ORDER — IOHEXOL 300 MG/ML  SOLN
80.0000 mL | Freq: Once | INTRAMUSCULAR | Status: AC | PRN
  Administered 2021-01-19: 80 mL via INTRAVENOUS

## 2021-01-19 NOTE — ED Triage Notes (Signed)
Patient complains of vomiting since 0100 this am. No diarrhea, no abdominal pain. Alert and oriented.  Reports feelio\ng cold with same

## 2021-01-19 NOTE — ED Provider Notes (Signed)
Emergency Medicine Provider Triage Evaluation Note  Patricia English , a 87 y.o. female  was evaluated in triage.  Pt complains of 4 episodes of nonbloody, nonbilious emesis that started around 1 AM this morning.  No diarrhea or abdominal pain.  Son at bedside and provided history.  Patient admits to feeling cold.  No sick contacts or known COVID exposures.  Patient is vaccinated against COVID-19.  Review of Systems  Positive: Nausea and vomiting Negative: Abdominal pain  Physical Exam  BP (!) 143/63 (BP Location: Right Arm)   Pulse (!) 110   Temp (!) 100.4 F (38 C) (Oral)   Resp 18   SpO2 96%  Gen:   Awake, no distress   Resp:  Normal effort  MSK:   Moves extremities without difficulty  Other:  Abdomen soft, nondistended, nontender to palpation in all quadrants without guarding or peritoneal signs. No rebound.    Medical Decision Making  Medically screening exam initiated at 4:55 PM.  Appropriate orders placed.  Pavneet Leis was informed that the remainder of the evaluation will be completed by another provider, this initial triage assessment does not replace that evaluation, and the importance of remaining in the ED until their evaluation is complete.  Febrile and tachycardic upon arrival with 4 episodes of emesis. Abdomen soft, non-distended, and non-tender. Will obtain sepsis screaming labs. COVID/influenza test. Patient non-toxic appearing. Informed charge RN about patient.    Suzy Bouchard, PA-C 01/19/21 [id number]    Blanchie Dessert, MD 01/19/21 [id number]

## 2021-01-19 NOTE — ED Provider Notes (Signed)
[facility] EMERGENCY DEPARTMENT Provider Note   CSN: [id number] Arrival date & time: 01/19/21  1633     History Chief Complaint  Patient presents with   Emesis    Patricia English is a 87 y.o. female.  Patient with history of GI bleed and diabetes presents today with chief complaint of emesis.  She states that same awoke her from sleep this morning around 1 AM and she proceeded to have 4 episodes of nonbloody nonbilious emesis with the last episode occurring around 1 p.m. this afternoon.  She denies any history of similar, and states that she was well when she went to bed last night. She states that she had a normal bowel movement this morning. She denies fevers, chills, cough, congestion, chest pain, shortness of breath, abdominal pain, dysuria, melena, or hematochezia. Patient lives at home with her son, is ambulatory without assistance and is able to complete ADLs without difficulty.  The history is provided by the patient. No language interpreter was used.  Emesis Associated symptoms: fever   Associated symptoms: no abdominal pain, no chills, no cough, no diarrhea and no headaches       Past Medical History:  Diagnosis Date   Coronary artery disease    Diabetes mellitus without complication ([place])    GI bleeding 11/2017   Hypertension    Internal hemorrhoids     Patient Active Problem List   Diagnosis Date Noted   Acute GI bleeding 10/28/2020   DM2 (diabetes mellitus, type 2) ([place]) 10/28/2020   Weakness 10/28/2020    Class: Acute   Acute upper GI bleed 10/28/2020   Internal and external bleeding hemorrhoids    Acute lower GI bleeding 12/08/2017   Dysphagia 08/11/2017    Class: Acute    Past Surgical History:  Procedure Laterality Date   BIOPSY  10/30/2020   Procedure: BIOPSY;  Surgeon: Sharyn Creamer, MD;  Location: [place];  Service: Gastroenterology;;   BIOPSY  10/31/2020   Procedure: BIOPSY;  Surgeon: Sharyn Creamer, MD;  Location: [place];  Service: Gastroenterology;;   CATARACT EXTRACTION     COLONOSCOPY WITH PROPOFOL N/A 10/31/2020   Procedure: COLONOSCOPY WITH PROPOFOL;  Surgeon: Sharyn Creamer, MD;  Location: [place];  Service: Gastroenterology;  Laterality: N/A;   ESOPHAGOGASTRODUODENOSCOPY (EGD) WITH PROPOFOL N/A 08/13/2017   Procedure: ESOPHAGOGASTRODUODENOSCOPY (EGD) WITH PROPOFOL;  Surgeon: [name], MD;  Location: [place];  Service: Endoscopy;  Laterality: N/A;   ESOPHAGOGASTRODUODENOSCOPY (EGD) WITH PROPOFOL N/A 10/30/2020   Procedure: ESOPHAGOGASTRODUODENOSCOPY (EGD) WITH PROPOFOL;  Surgeon: Sharyn Creamer, MD;  Location: [facility];  Service: Gastroenterology;  Laterality: N/A;   FLEXIBLE SIGMOIDOSCOPY N/A 12/10/2017   Procedure: FLEXIBLE SIGMOIDOSCOPY;  Surgeon: [name], MD;  Location: [facility] ENDOSCOPY;  Service: Endoscopy;  Laterality: N/A;   HEMORRHOID BANDING  12/10/2017   Procedure: HEMORRHOID BANDING;  Surgeon: [name], MD;  Location: [place];  Service: Endoscopy;;   HEMOSTASIS CLIP PLACEMENT  10/31/2020   Procedure: HEMOSTASIS CLIP PLACEMENT;  Surgeon: Sharyn Creamer, MD;  Location: [place];  Service: Gastroenterology;;   POLYPECTOMY  10/31/2020   Procedure: POLYPECTOMY;  Surgeon: Sharyn Creamer, MD;  Location: [facility] ENDOSCOPY;  Service: Gastroenterology;;     OB History   No obstetric history on file.     No family history on file.  Social History   Tobacco Use   Smoking status: Never   Smokeless tobacco: Never  Vaping Use   Vaping Use: Never  used  Substance Use Topics   Alcohol use: No   Drug use: Never    Home Medications Prior to Admission medications   Medication Sig Start Date End Date Taking? Authorizing Provider  Cholecalciferol 25 MCG (1000 UT) tablet Take 1,000 Units by mouth daily.    [provider]  Coenzyme Q10 100 MG TABS Take 100 mg by mouth daily.    [provider]  diltiazem (DILACOR XR) 180 MG 24 hr capsule  Take 1 capsule (180 mg total) by mouth daily. 11/01/20   [name], MD  furosemide (LASIX) 40 MG tablet Take 1 tablet (40 mg total) by mouth every Monday, Wednesday, and Friday. 11/02/20   [name], MD  glipiZIDE (GLUCOTROL XL) 5 MG 24 hr tablet Take 5 mg by mouth every evening.     [provider]  isosorbide mononitrate (IMDUR) 30 MG 24 hr tablet Take 30 mg by mouth every evening.     [provider]  metFORMIN (GLUCOPHAGE) 500 MG tablet Take 250 mg by mouth 2 (two) times daily with a meal.    [provider]  metoprolol (LOPRESSOR) 50 MG tablet Take 25 mg by mouth 2 (two) times daily.     [provider]  pantoprazole (PROTONIX) 40 MG tablet Take 1 tablet (40 mg total) by mouth 2 (two) times daily. 11/01/20   [name], MD  pravastatin (PRAVACHOL) 40 MG tablet Take 40 mg by mouth daily. 06/06/17   [provider]    Allergies    Lisinopril  Review of Systems   Review of Systems  Constitutional:  Positive for fever. Negative for chills.  HENT:  Negative for congestion and rhinorrhea.   Respiratory:  Negative for cough and shortness of breath.   Cardiovascular:  Negative for chest pain and leg swelling.  Gastrointestinal:  Positive for nausea and vomiting. Negative for abdominal distention, abdominal pain, constipation and diarrhea.  Genitourinary:  Negative for difficulty urinating, dysuria, frequency and pelvic pain.  Skin:  Negative for wound.  Neurological:  Negative for dizziness, tremors, seizures, syncope, facial asymmetry, speech difficulty, weakness, light-headedness, numbness and headaches.  Psychiatric/Behavioral:  Negative for confusion and decreased concentration.   All other systems reviewed and are negative.  Physical Exam Updated Vital Signs BP 123/61   Pulse 96   Temp (!) 100.4 F (38 C) (Oral)   Resp (!) 27   SpO2 100%   Physical Exam Vitals and nursing note reviewed.  Constitutional:      General: She is  not in acute distress.    Appearance: Normal appearance. She is normal weight. She is not ill-appearing, toxic-appearing or diaphoretic.  HENT:     Head: Normocephalic and atraumatic.     Mouth/Throat:     Mouth: Mucous membranes are moist.  Eyes:     Extraocular Movements: Extraocular movements intact.     Conjunctiva/sclera: Conjunctivae normal.     Pupils: Pupils are equal, round, and reactive to light.  Cardiovascular:     Rate and Rhythm: Regular rhythm. Tachycardia present.     Heart sounds: Normal heart sounds.  Pulmonary:     Effort: Pulmonary effort is normal. No respiratory distress.     Breath sounds: Normal breath sounds.     Comments: Lungs clear to ausciltation in all fields Abdominal:     General: Abdomen is flat. Bowel sounds are normal. There is no distension.     Palpations: Abdomen is soft. There is no mass.     Tenderness:  There is no abdominal tenderness. There is no right CVA tenderness, left CVA tenderness, guarding or rebound.     Hernia: No hernia is present.  Musculoskeletal:        General: No swelling, tenderness, deformity or signs of injury. Normal range of motion.     Right lower leg: No edema.     Left lower leg: No edema.  Skin:    General: Skin is warm and dry.  Neurological:     General: No focal deficit present.     Mental Status: She is alert and oriented to person, place, and time.  Psychiatric:        Mood and Affect: Mood normal.        Behavior: Behavior normal.    ED Results / Procedures / Treatments   Labs (all labs ordered are listed, but only abnormal results are displayed) Labs Reviewed  LACTIC ACID, PLASMA - Abnormal; Notable for the following components:      Result Value   Lactic Acid, Venous 3.0 (*)    All other components within normal limits  LACTIC ACID, PLASMA - Abnormal; Notable for the following components:   Lactic Acid, Venous 2.4 (*)    All other components within normal limits  COMPREHENSIVE METABOLIC PANEL -  Abnormal; Notable for the following components:   CO2 21 (*)    Creatinine, Ser 1.46 (*)    Calcium 8.8 (*)    GFR, Estimated 34 (*)    All other components within normal limits  CBC WITH DIFFERENTIAL/PLATELET - Abnormal; Notable for the following components:   WBC 14.1 (*)    Neutro Abs 12.3 (*)    All other components within normal limits  URINALYSIS, ROUTINE W REFLEX MICROSCOPIC - Abnormal; Notable for the following components:   APPearance HAZY (*)    Hgb urine dipstick TRACE (*)    Nitrite POSITIVE (*)    Leukocytes,Ua SMALL (*)    All other components within normal limits  URINALYSIS, MICROSCOPIC (REFLEX) - Abnormal; Notable for the following components:   Bacteria, UA MANY (*)    All other components within normal limits  RESP PANEL BY RT-PCR (FLU A&B, COVID) ARPGX2  CULTURE, BLOOD (ROUTINE X 2)  CULTURE, BLOOD (ROUTINE X 2)  URINE CULTURE  PROTIME-INR  APTT    EKG EKG Interpretation  Date/Time:  Tuesday January 19 2021 17:10:08 EST Ventricular Rate:  99 PR Interval:    QRS Duration: 60 QT Interval:  314 QTC Calculation: 402 R Axis:   -65 Text Interpretation: Atrial fibrillation with premature ventricular or aberrantly conducted complexes Left axis deviation Low voltage QRS Possible Anterolateral infarct , age undetermined Abnormal ECG No significant change since last tracing Confirmed by Wandra Arthurs [id number]) on 01/19/2021 6:25:47 PM  Radiology DG Chest 1 View  Result Date: 01/19/2021 CLINICAL DATA:  Vomiting. EXAM: CHEST  1 VIEW COMPARISON:  Chest x-ray 10/28/2020. FINDINGS: The heart size and mediastinal contours are within normal limits. There is bibasilar atelectasis. Lungs are otherwise clear. There is no pleural effusion or pneumothorax. IMPRESSION: No active disease. Electronically Signed   By: Ronney Asters M.D.   On: 01/19/2021 17:48   CT ABDOMEN PELVIS W CONTRAST  Result Date: 01/19/2021 CLINICAL DATA:  Abdominal pain and fever. EXAM: CT ABDOMEN AND  PELVIS WITH CONTRAST TECHNIQUE: Multidetector CT imaging of the abdomen and pelvis was performed using the standard protocol following bolus administration of intravenous contrast. CONTRAST:  80mL OMNIPAQUE IOHEXOL 300 MG/ML  SOLN COMPARISON:  None.  FINDINGS: Lower chest: Bibasilar linear atelectasis/scarring. There is coronary vascular calcification. No intra-abdominal free air or free fluid. Hepatobiliary: The liver is unremarkable. There is mild biliary ductal dilatation. The gallbladder is unremarkable. Pancreas: There is a 2.4 x 2.0 cm hypodense lesion in the region of the neck of the pancreas which is not characterized on this CT but may represent a side branch IPMN or a mucinous lesion. A malignancy is not excluded. Further characterization with MRI without and with contrast on a nonemergent/outpatient basis recommended. No active inflammatory changes. Spleen: Normal in size without focal abnormality. Adrenals/Urinary Tract: The adrenal glands unremarkable. There is no hydronephrosis on either side. There is symmetric enhancement and excretion of contrast by both kidneys. Subcentimeter left renal inferior pole hypodense lesion is not characterized. The visualized ureters and urinary bladder appear unremarkable. Stomach/Bowel: Severe diffuse colonic diverticulosis without active inflammatory changes. There is no bowel obstruction or active inflammation. The appendix is normal. Vascular/Lymphatic: Advanced aortoiliac atherosclerotic disease. The IVC is unremarkable. No portal venous gas. There is no adenopathy. Reproductive: The uterus is anteverted. There is thickened appearance of the endometrium measuring approximately 9 mm. Underlying endometrial neoplasm is not excluded further evaluation with ultrasound on a nonemergent/outpatient basis recommended. No adnexal masses. Other: None Musculoskeletal: Degenerative changes of the spine. No acute osseous pathology. IMPRESSION: 1. No acute intra-abdominal or  pelvic pathology. 2. Severe diffuse colonic diverticulosis. No bowel obstruction. Normal appendix. 3. Thickened appearance of the endometrium. Further evaluation with ultrasound on a nonemergent/outpatient basis recommended. 4. Indeterminate 2.4 x 2.0 cm hypodense lesion in the region of the neck of the pancreas. Further characterization with pancreatic protocol MRI on a nonemergent/outpatient basis recommended. 5. Aortic Atherosclerosis (ICD10-I70.0). Electronically Signed   By: Anner Crete M.D.   On: 01/19/2021 21:39    Procedures Procedures   Medications Ordered in ED Medications  sodium chloride 0.9 % bolus 1,000 mL (0 mLs Intravenous Stopped 01/19/21 2245)  acetaminophen (TYLENOL) tablet 650 mg (650 mg Oral Given 01/19/21 1926)  iohexol (OMNIPAQUE) 300 MG/ML solution 80 mL (80 mLs Intravenous Contrast Given 01/19/21 2127)  cefTRIAXone (ROCEPHIN) 1 g in sodium chloride 0.9 % 100 mL IVPB (0 g Intravenous Stopped 01/19/21 2348)    ED Course  I have reviewed the triage vital signs and the nursing notes.  Pertinent labs & imaging results that were available during my care of the patient were reviewed by me and considered in my medical decision making (see chart for details).    MDM Rules/Calculators/A&P                         Patient with 4 episodes of NBNB emesis since this morning. She is found to be febrile at 100.4, tachycardic and tachypnea, sepsis workup initiated. Lungs clear to auscultation in all fields, chest x-ray clear, COVID and flu negative. Lactic 3.0, creatinine bump from 1.03 --> 1.46 from 2 months ago, no electrolyte abnormalities. Leukocytosis at 14.1, no anemia. Will obtain CT abdomen and pelvis with contrast for further evaluation in the presence of emesis without clear etiology.  CT scan without emergent findings, thickened endometrium with [place] outpatient and hypodense region on the neck of the pancreas with MRI outpatient recommendations. Doubt either of these to be the  etiology of the patients symptoms this evening. Will recommend outpatient imaging for evaluation of this through her PCP.  UA reveals infection, will treat for same with 1g Rocephin in the ED this evening. Culture pending.  Following fluid bolus, repeat lactic down to 2.4. She was originally found to be tachycardic and febrile both of which have now resolved. She is asymptomatic without emesis in the ED this evening. Very well appearing and at her baseline according to her son. After thorough discussion of admission vs discharge, patient is adamant that she would like to manage at home. She is alert and oriented and able to make healthcare decisions at this time. She is afebrile, non-toxic appearing, and in no acute distress speaking in complete sentences. Plan to discharge with close PCP follow-up in the next few days. Will give oral antibiotics and antiemetics, encourage plenty of oral hydration and close return precautions. She has been educated on red flag symptoms that would prompt immediate return. Discharged in stable condition.   This is a shared visit with supervising physician Dr. Darl Householder who has independently evaluated patient & provided guidance in evaluation/management/disposition, in agreement with care   Final Clinical Impression(s) / ED Diagnoses Final diagnoses:  Nausea and vomiting, unspecified vomiting type  Acute cystitis with hematuria    Rx / DC Orders ED Discharge Orders          Ordered    cephALEXin (KEFLEX) 500 MG capsule  3 times daily        01/20/21 0015    ondansetron (ZOFRAN) 4 MG tablet  Every 6 hours        01/20/21 0015          An After Visit Summary was printed and given to the patient.    Nestor Lewandowsky 01/20/21 0020    Drenda Freeze, MD 01/26/21 [phone]

## 2021-01-19 NOTE — ED Notes (Signed)
Patient transported to CT 

## 2021-01-20 MED ORDER — CEPHALEXIN 500 MG PO CAPS: 500.0000 mg | ORAL_CAPSULE | Freq: Three times a day (TID) | ORAL | 0 refills | Status: AC

## 2021-01-20 MED ORDER — ONDANSETRON HCL 4 MG PO TABS: 4.0000 mg | ORAL_TABLET | Freq: Four times a day (QID) | ORAL | 0 refills | Status: DC

## 2021-01-20 NOTE — Discharge Instructions (Addendum)
You were diagnosed with a UTI in the ED this evening. You received 1 dose of IV antibiotics for this, I have also prescribed you oral antibiotics for you to take at home. Please take entire dose as prescribed. I have also given you a med for nausea called Zofran should you feeling nauseous or have vomiting at home.  Please call you PCP to schedule a follow-up appointment in the next few days. In the interim, make sure you are staying adequately hydrated.  Additionally, if anything changes in your condition, please return to the ER

## 2021-01-21 LAB — URINE CULTURE

## 2021-01-24 LAB — CULTURE, BLOOD (ROUTINE X 2)
Culture: NO GROWTH
Culture: NO GROWTH

## 2021-02-18 ENCOUNTER — Encounter (HOSPITAL_COMMUNITY): Payer: Self-pay | Admitting: Radiology

## 2021-06-13 ENCOUNTER — Other Ambulatory Visit: Payer: Self-pay

## 2021-06-13 ENCOUNTER — Emergency Department (HOSPITAL_COMMUNITY)
Admission: EM | Admit: 2021-06-13 | Discharge: 2021-06-14 | Disposition: A | Discharge status: Home / Self Care | Payer: Medicare Other | Source: Home / Self Care | Attending: Emergency Medicine | Admitting: Emergency Medicine

## 2021-06-13 ENCOUNTER — Emergency Department (HOSPITAL_COMMUNITY): Payer: Medicare Other

## 2021-06-13 ENCOUNTER — Encounter (HOSPITAL_COMMUNITY): Payer: Self-pay | Admitting: Emergency Medicine

## 2021-06-13 DIAGNOSIS — W1830XA Fall on same level, unspecified, initial encounter: Secondary | ICD-10-CM | POA: Insufficient documentation

## 2021-06-13 DIAGNOSIS — E119 Type 2 diabetes mellitus without complications: Secondary | ICD-10-CM | POA: Insufficient documentation

## 2021-06-13 DIAGNOSIS — I10 Essential (primary) hypertension: Secondary | ICD-10-CM | POA: Insufficient documentation

## 2021-06-13 DIAGNOSIS — Z7984 Long term (current) use of oral hypoglycemic drugs: Secondary | ICD-10-CM | POA: Insufficient documentation

## 2021-06-13 DIAGNOSIS — Z79899 Other long term (current) drug therapy: Secondary | ICD-10-CM | POA: Insufficient documentation

## 2021-06-13 DIAGNOSIS — I2510 Atherosclerotic heart disease of native coronary artery without angina pectoris: Secondary | ICD-10-CM | POA: Insufficient documentation

## 2021-06-13 DIAGNOSIS — Y92019 Unspecified place in single-family (private) house as the place of occurrence of the external cause: Secondary | ICD-10-CM | POA: Insufficient documentation

## 2021-06-13 DIAGNOSIS — R220 Localized swelling, mass and lump, head: Secondary | ICD-10-CM | POA: Insufficient documentation

## 2021-06-13 DIAGNOSIS — I63511 Cerebral infarction due to unspecified occlusion or stenosis of right middle cerebral artery: Secondary | ICD-10-CM | POA: Diagnosis not present

## 2021-06-13 DIAGNOSIS — S0512XA Contusion of eyeball and orbital tissues, left eye, initial encounter: Secondary | ICD-10-CM

## 2021-06-13 DIAGNOSIS — S0012XA Contusion of left eyelid and periocular area, initial encounter: Secondary | ICD-10-CM | POA: Insufficient documentation

## 2021-06-13 DIAGNOSIS — W19XXXA Unspecified fall, initial encounter: Secondary | ICD-10-CM

## 2021-06-13 DIAGNOSIS — I639 Cerebral infarction, unspecified: Secondary | ICD-10-CM | POA: Diagnosis not present

## 2021-06-13 IMAGING — CT CT CERVICAL SPINE W/O CM
3 of 4 series · 12 of 33 positions shown, 14 images · non-contrast
Comparison: None.

CLINICAL DATA: Trauma.



[Series 4: c_spine 2.0 st · axial · 0.41mm/px · z∈[-244,-104]mm · 4 of 106 slices shown, 5 images]
[im 18/106  soft-tissue]
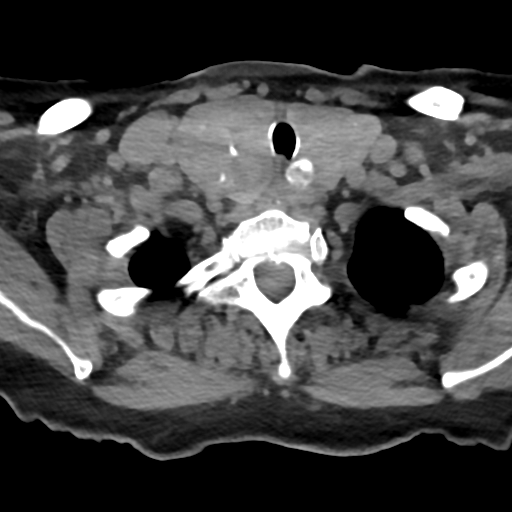
[im 18/106  bone]
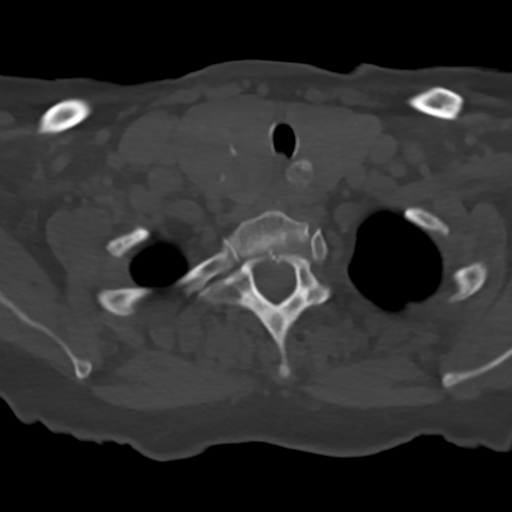
[im 36/106  bone]
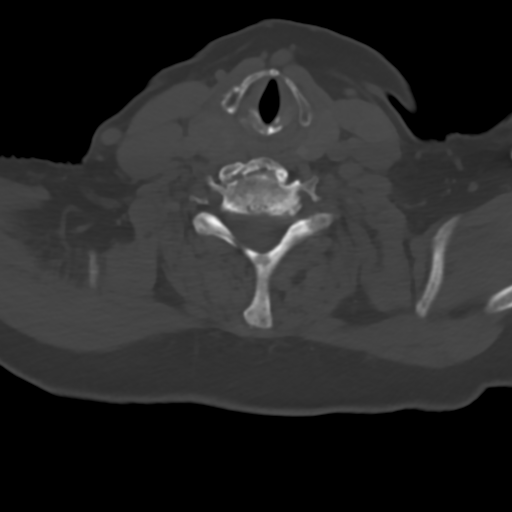
[im 71/106  bone]
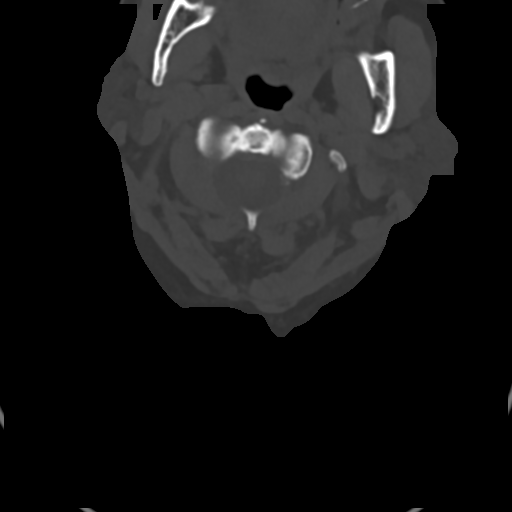
[im 88/106  bone]
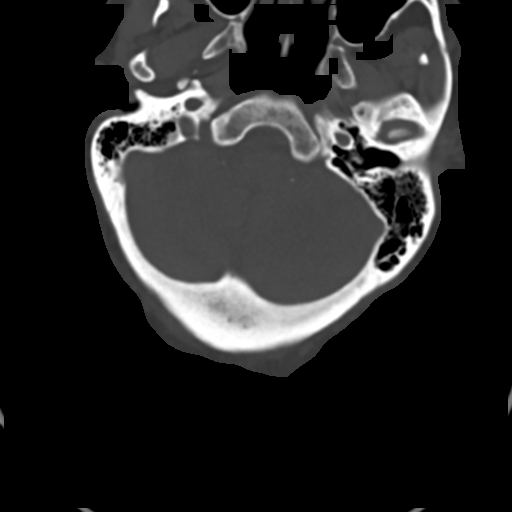

[Series 9: c_spine 2.0 sag bone · sagittal · 0.31mm/px · 5 of 61 slices shown, 6 images]
[im 21/61  bone]
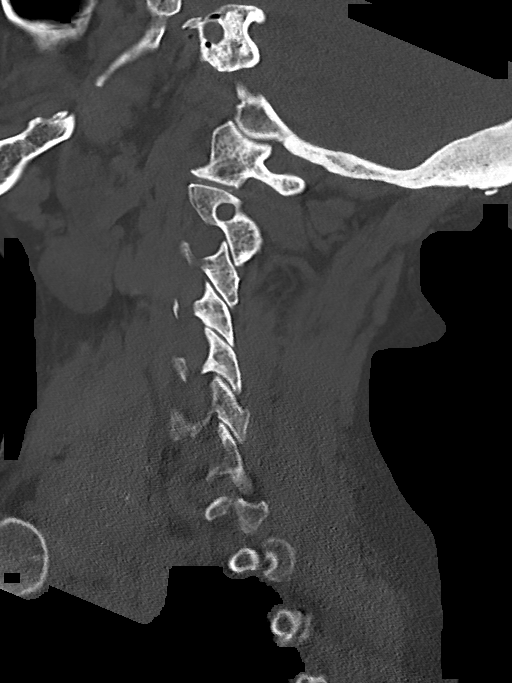
[im 26/61  bone]
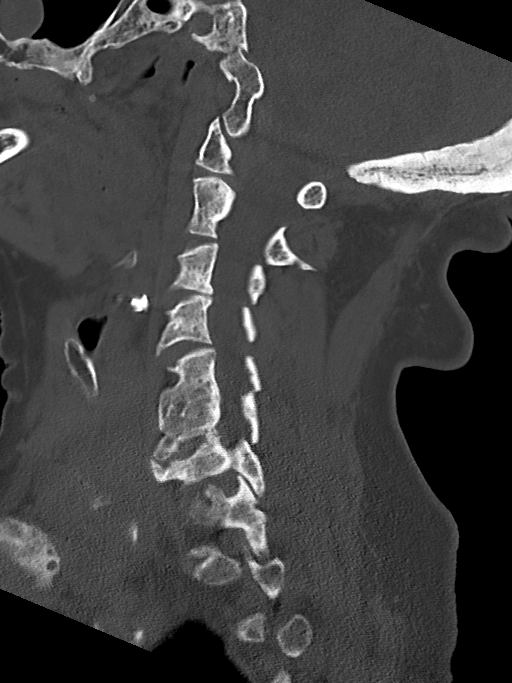
[im 31/61  soft-tissue]
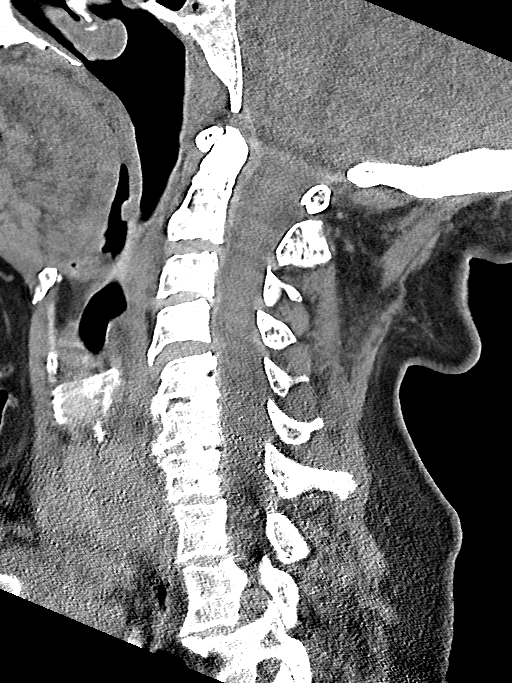
[im 31/61  bone]
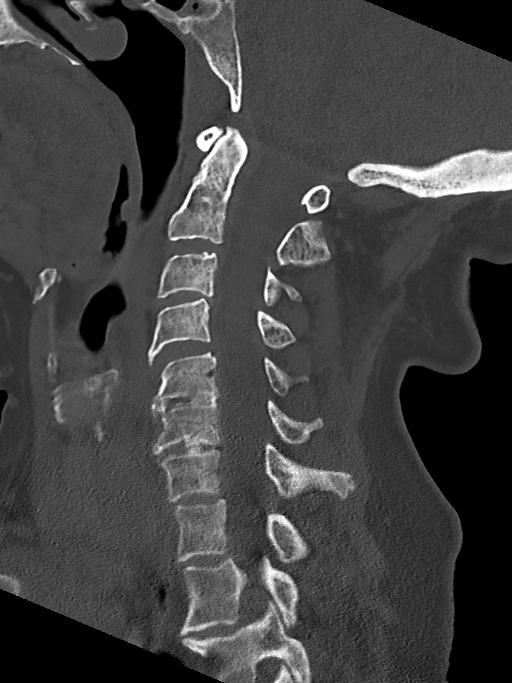
[im 36/61  bone]
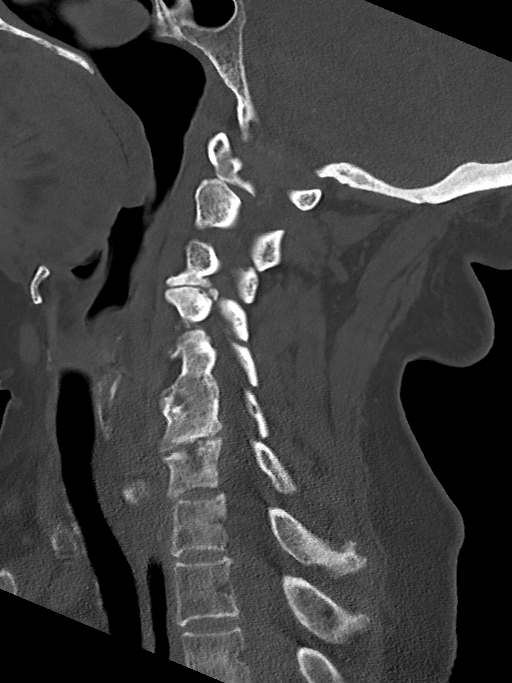
[im 41/61  bone]
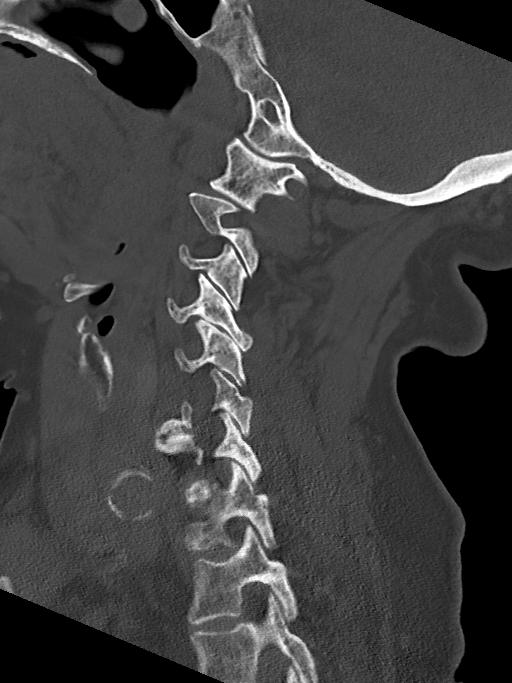

[Series 10: c_spine 2.0 cor bone · coronal · 0.23mm/px · 3 of 80 slices shown]
[im 21/80  bone]
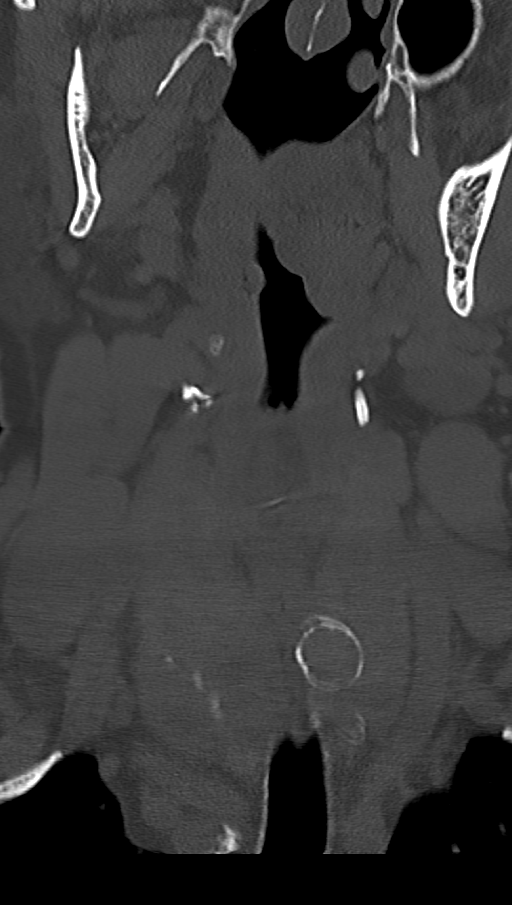
[im 34/80  bone]
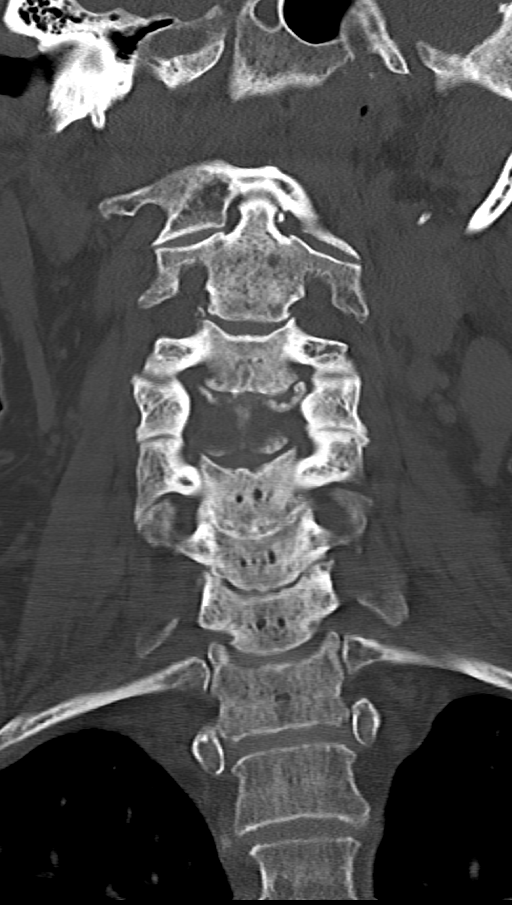
[im 46/80  bone]
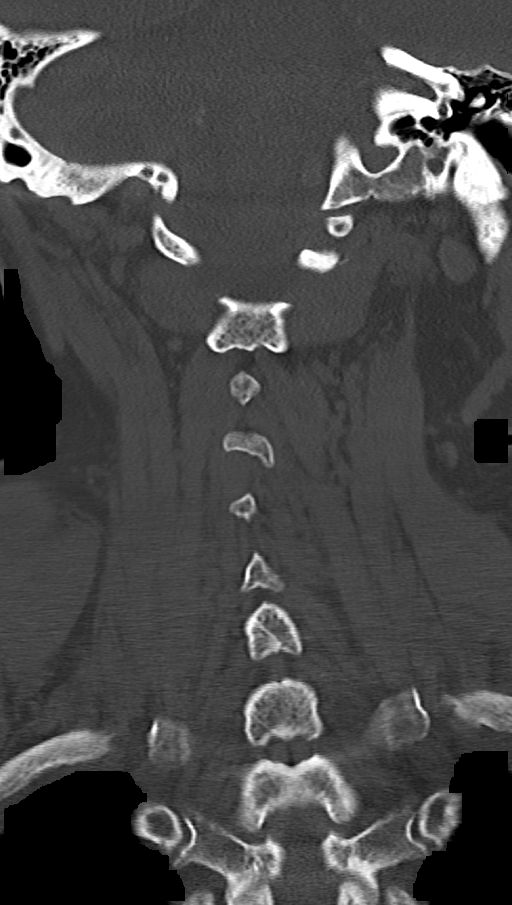

[12 of 33 positions shown; findings below may reference images not displayed]

FINDINGS: Alignment: Normal.

Skull base and vertebrae: No acute fracture. No primary bone lesion
or focal pathologic process.

Soft tissues and spinal canal: No prevertebral fluid or swelling. No
visible canal hematoma.

Disc levels: There is disc space narrowing and endplate osteophyte
formation throughout the cervical spine most significant at C5-C6
and C6-C7. There is no severe central canal or neural foraminal
stenosis at any level.

Upper chest: Negative.

Other: The thyroid gland is moderately diffusely enlarged and
contains numerous nodules, some of which have calcifications. Is
nodules are not well defined on noncontrast CT.
IMPRESSION: 1. No evidence for acute fracture or malalignment.
2. Mild multilevel degenerative changes.
3. Multinodular goiter. Recommend clinical correlation and
follow-up.

## 2021-06-13 IMAGING — CT CT HEAD W/O CM
4 series · 15 of 47 positions shown, 17 images · non-contrast
Comparison: None.

CLINICAL DATA: Head trauma

EXAM:
CT HEAD WITHOUT CONTRAST
CT MAXILLOFACIAL WITHOUT CONTRAST
TECHNIQUE: Multidetector CT imaging of the head and maxillofacial structures
were performed using the standard protocol without intravenous
contrast. Multiplanar CT image reconstructions of the maxillofacial
structures were also generated.
RADIATION DOSE REDUCTION: This exam was performed according to the
departmental dose-optimization program which includes automated
exposure control, adjustment of the mA and/or kV according to
patient size and/or use of iterative reconstruction technique.

[Series 3: head without · axial · non-contrast · 0.47mm/px · z∈[-116,+10]mm · 7 of 35 slices shown, 9 images]
[im 5/35  brain]
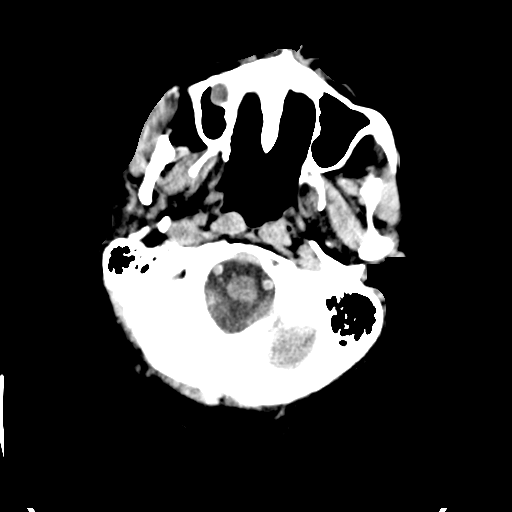
[im 5/35  bone]
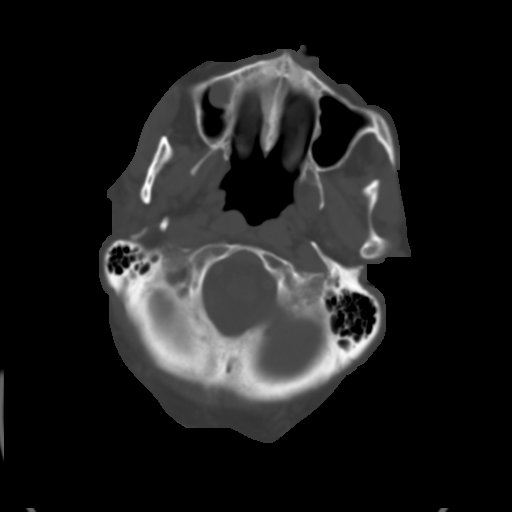
[im 9/35  brain]
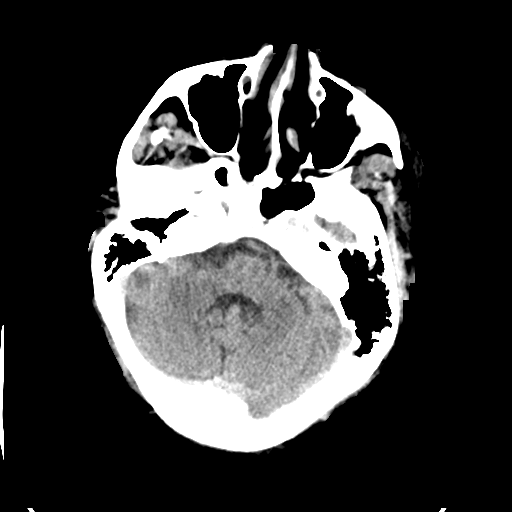
[im 13/35  brain]
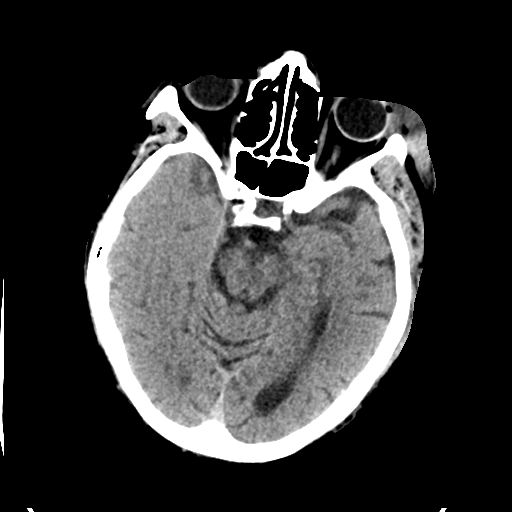
[im 18/35  brain]
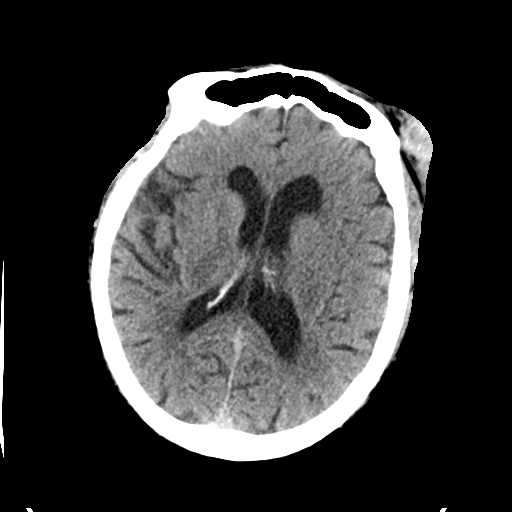
[im 22/35  brain]
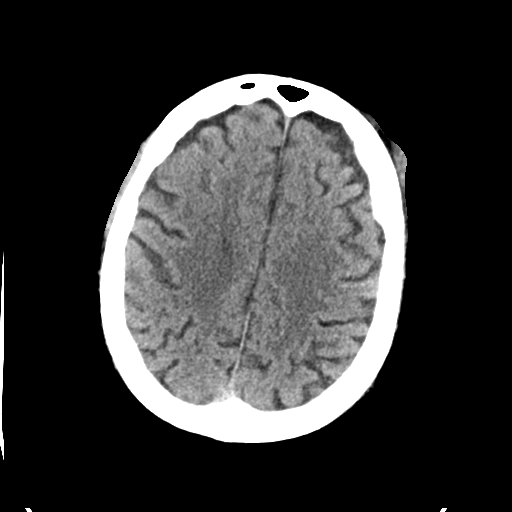
[im 22/35  bone]
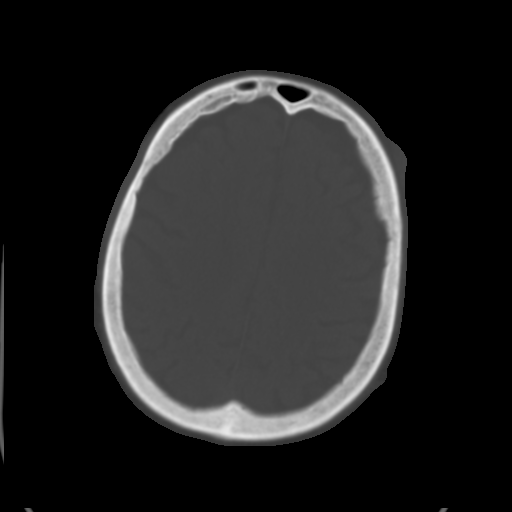
[im 26/35  brain]
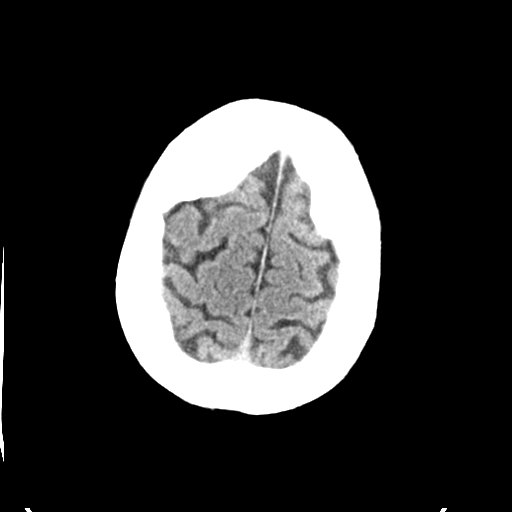
[im 30/35  brain]
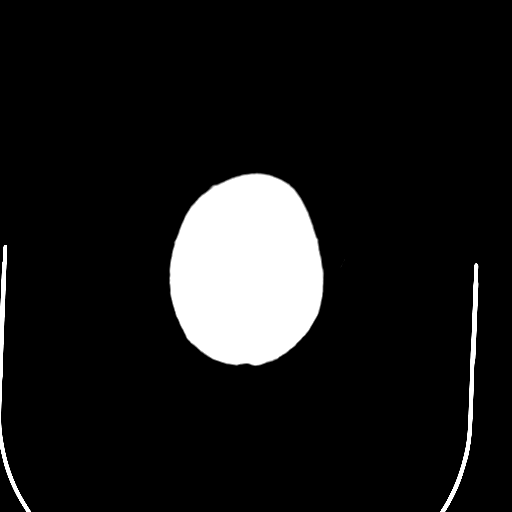

[Series 4: head bone · axial · 0.47mm/px · z∈[-120,-102]mm · 2 of 88 slices shown]
[im 9/88  bone]
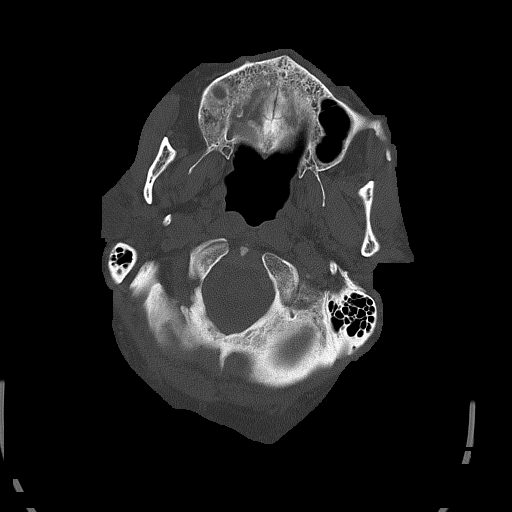
[im 18/88  bone]
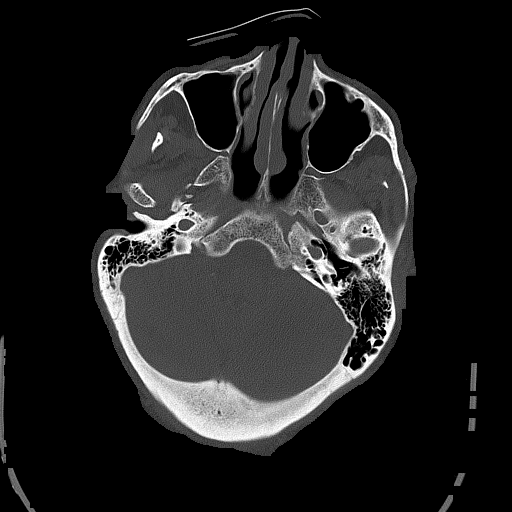

[Series 5: head without cor · coronal · non-contrast · 0.34mm/px · 3 of 78 slices shown]
[im 26/78  brain]
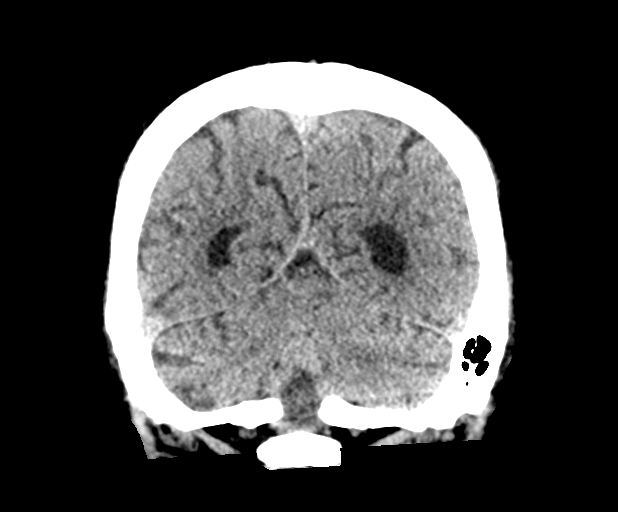
[im 35/78  brain]
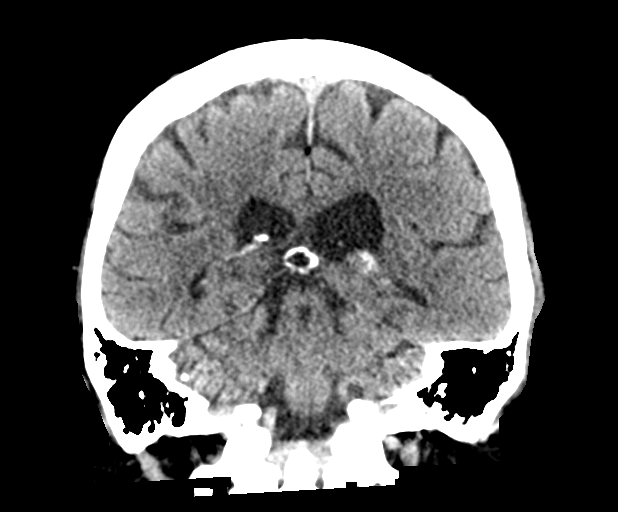
[im 43/78  brain]
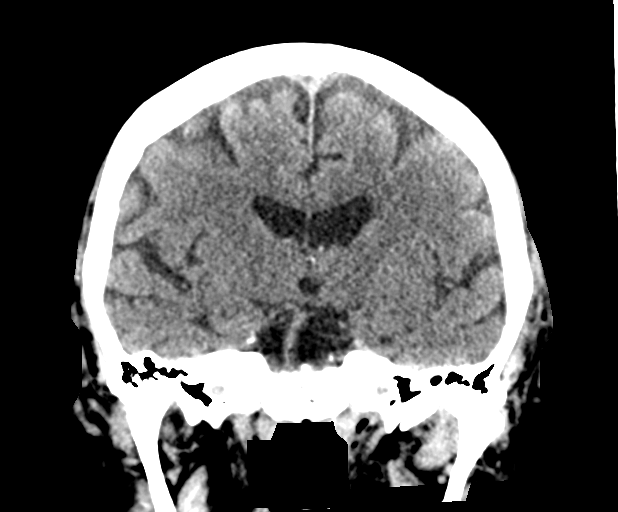

[Series 6: head without sag · sagittal · non-contrast · 0.34mm/px · 3 of 71 slices shown]
[im 24/71  brain]
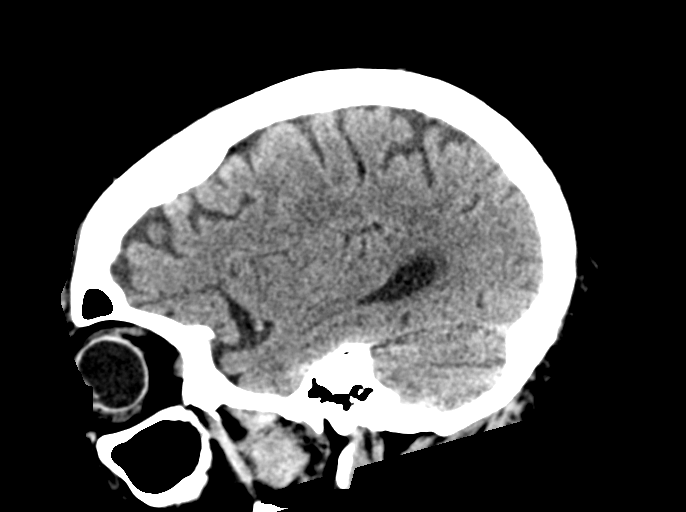
[im 36/71  brain]
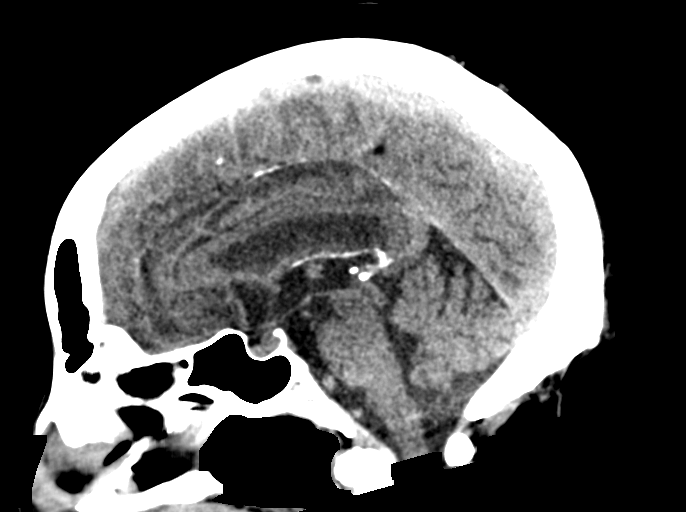
[im 47/71  brain]
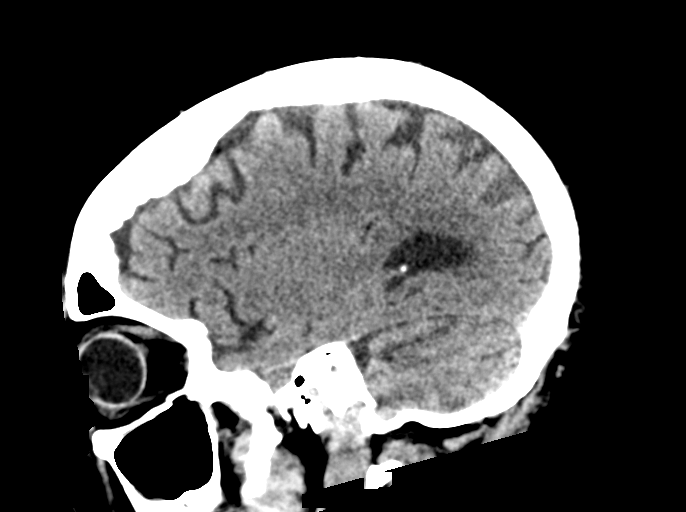

[15 of 47 positions shown; findings below may reference images not displayed]

FINDINGS: CT HEAD FINDINGS

Brain: No evidence of acute infarction, hemorrhage, hydrocephalus,
extra-axial collection or mass lesion/mass effect.

Vascular: Hyperdense vessel.

Skull: No acute fracture or suspicious osseous lesion.

Other: None.

CT MAXILLOFACIAL FINDINGS

Osseous: No fracture or mandibular dislocation. No destructive
process.

Orbits: Negative. No traumatic or inflammatory finding. Status post
bilateral lens replacements.

Sinuses: Mucous retention cyst in the right maxillary sinus.
Otherwise clear.

Soft tissues: Left frontal scalp hematoma, extending to the left
periorbital region.
IMPRESSION: 1.  No acute intracranial process.
2. No acute facial bone fracture.
3. Hematoma in the left frontal scalp and periorbital soft tissues.

## 2021-06-13 IMAGING — CT CT MAXILLOFACIAL W/O CM
3 series · 15 of 47 positions shown, 18 images · non-contrast
Comparison: None.

CLINICAL DATA: Head trauma

EXAM:
CT HEAD WITHOUT CONTRAST
CT MAXILLOFACIAL WITHOUT CONTRAST
TECHNIQUE: Multidetector CT imaging of the head and maxillofacial structures
were performed using the standard protocol without intravenous
contrast. Multiplanar CT image reconstructions of the maxillofacial
structures were also generated.
RADIATION DOSE REDUCTION: This exam was performed according to the
departmental dose-optimization program which includes automated
exposure control, adjustment of the mA and/or kV according to
patient size and/or use of iterative reconstruction technique.

[Series 3: facialbone 2.0 st · axial · 0.40mm/px · z∈[-196,-34]mm · 9 of 95 slices shown, 12 images]
[im 7/95  brain]
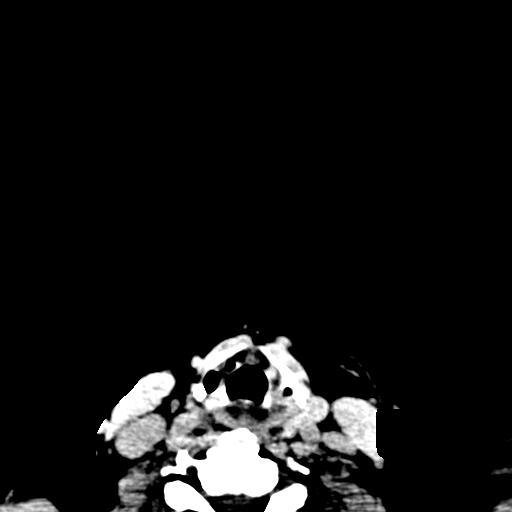
[im 7/95  bone]
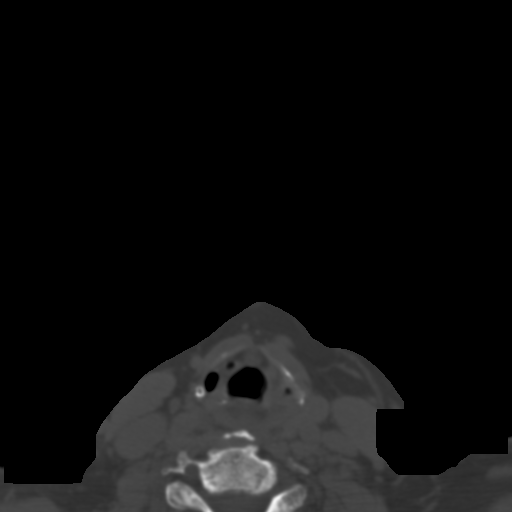
[im 17/95  bone]
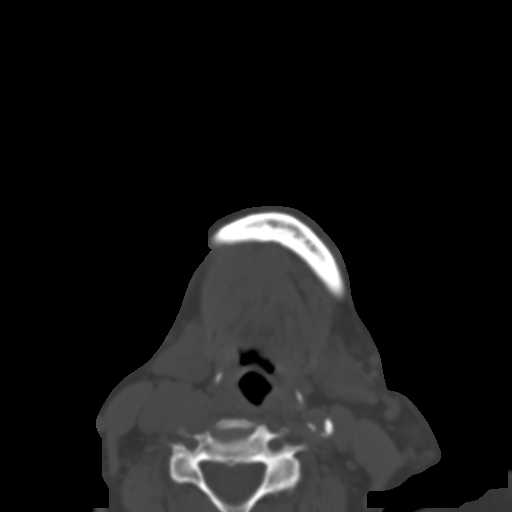
[im 26/95  bone]
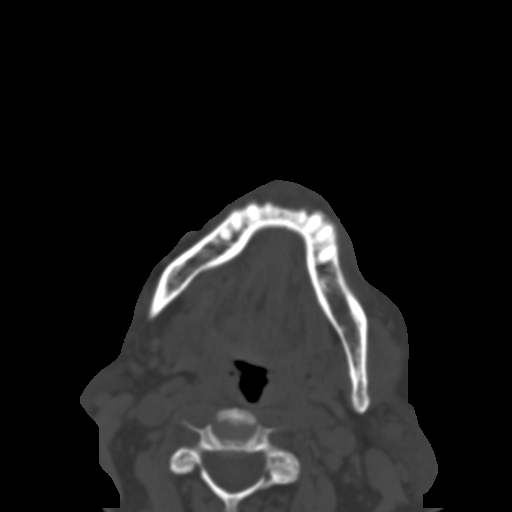
[im 36/95  bone]
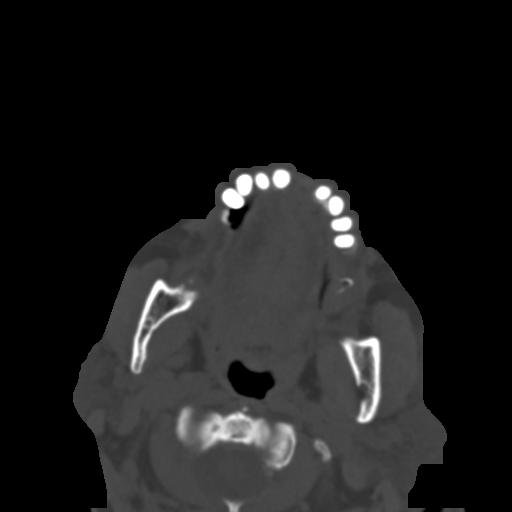
[im 49/95  brain]
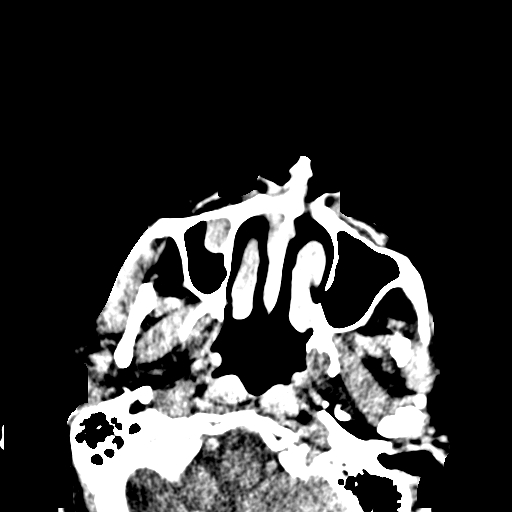
[im 49/95  bone]
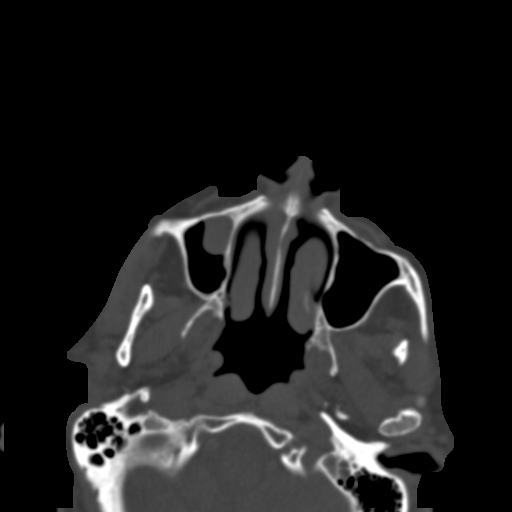
[im 59/95  bone]
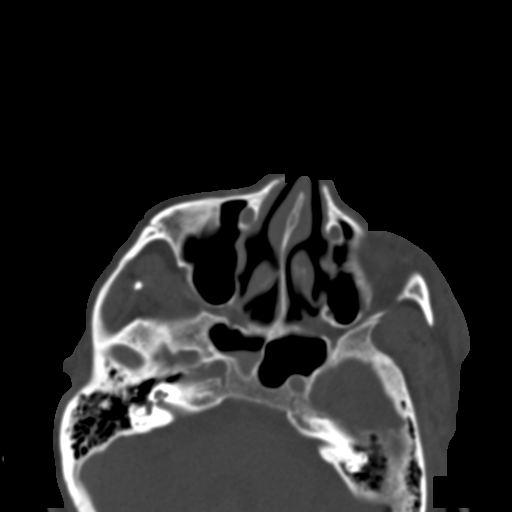
[im 69/95  bone]
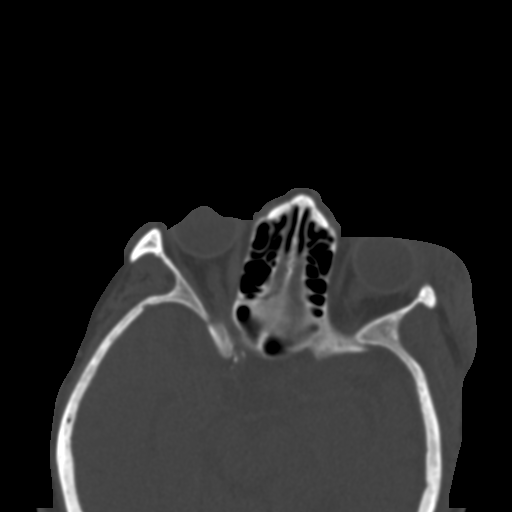
[im 78/95  bone]
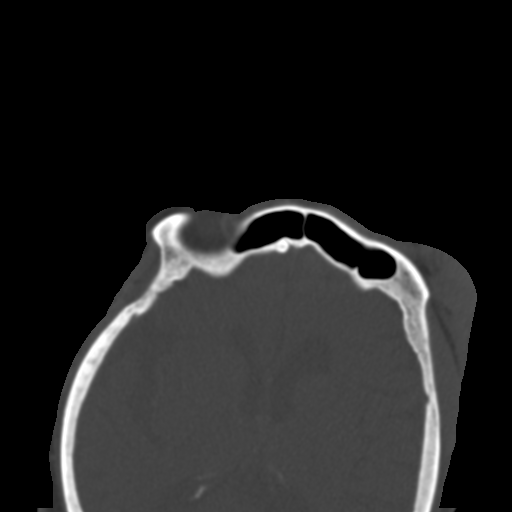
[im 88/95  brain]
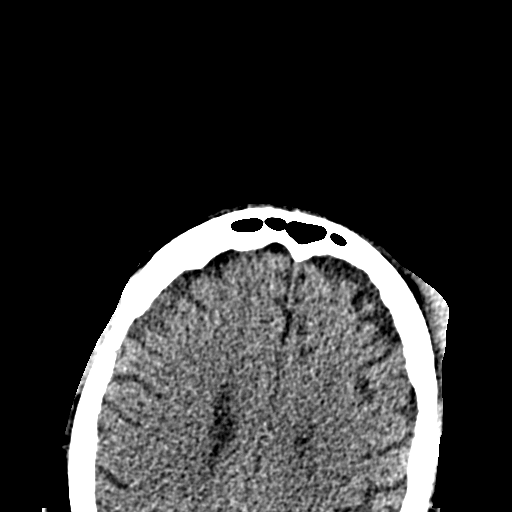
[im 88/95  bone]
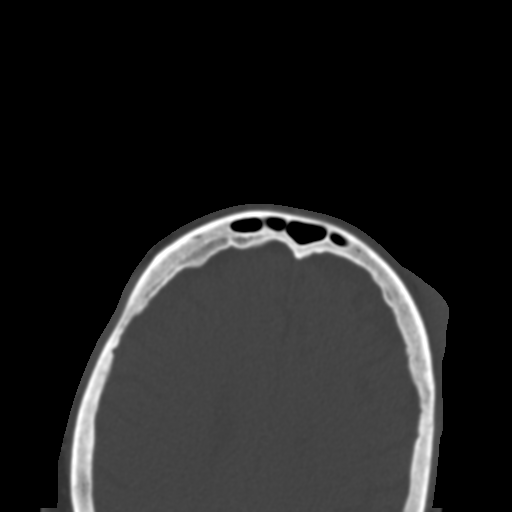

[Series 9: facialbone 2.0 sag st · sagittal · 0.29mm/px · 3 of 92 slices shown]
[im 31/92  bone]
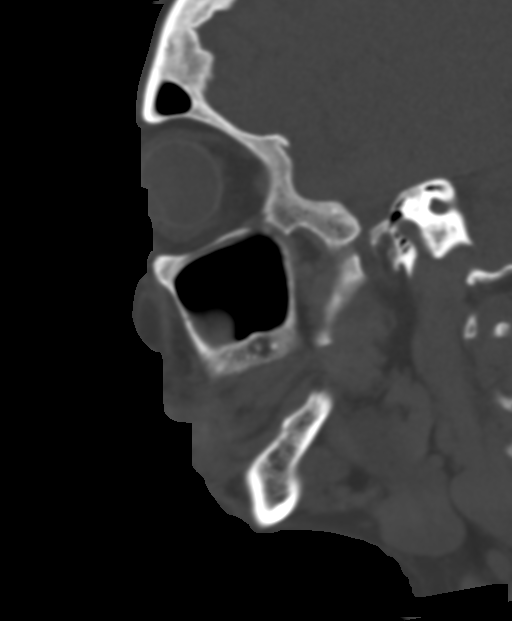
[im 46/92  bone]
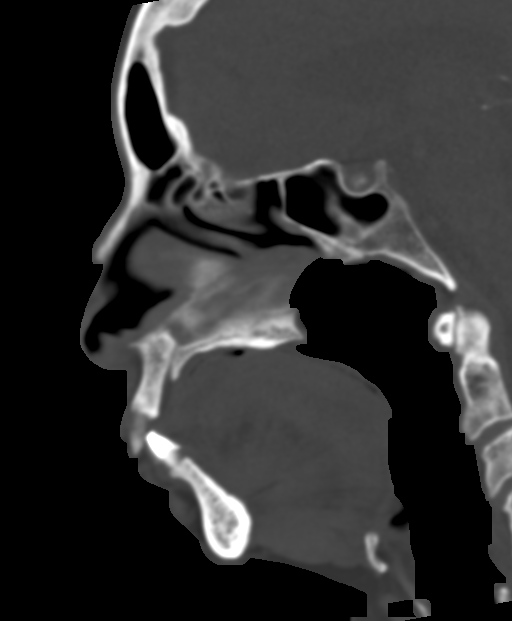
[im 61/92  bone]
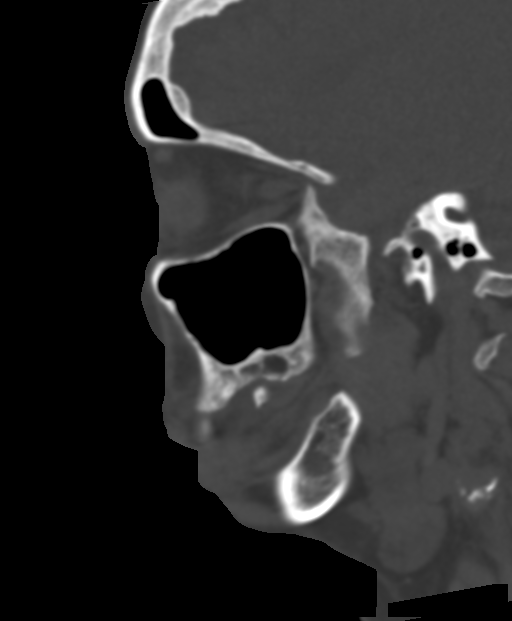

[Series 10: facialbone 2.0 cor st · coronal · 0.36mm/px · 3 of 76 slices shown]
[im 26/76  bone]
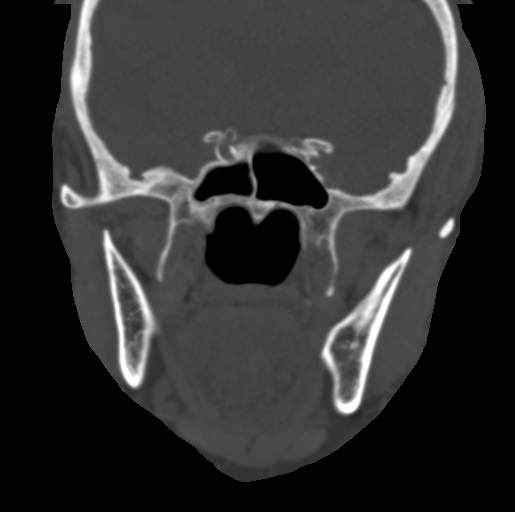
[im 34/76  bone]
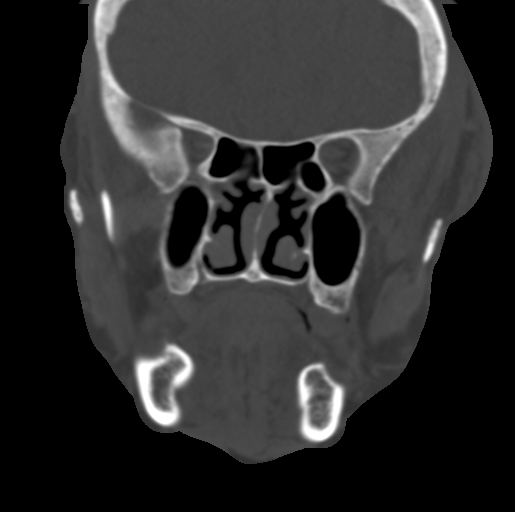
[im 42/76  bone]
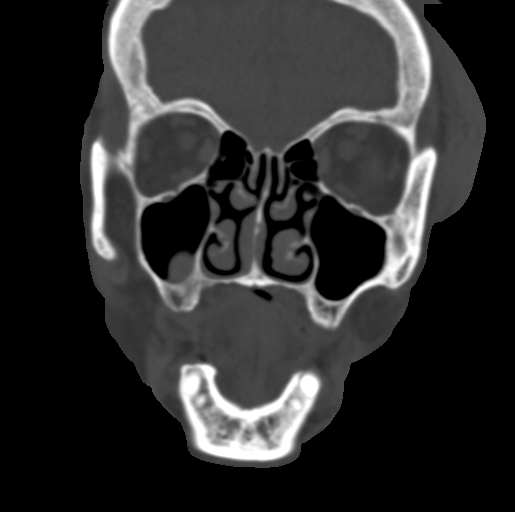

[15 of 47 positions shown; findings below may reference images not displayed]

FINDINGS: CT HEAD FINDINGS

Brain: No evidence of acute infarction, hemorrhage, hydrocephalus,
extra-axial collection or mass lesion/mass effect.

Vascular: Hyperdense vessel.

Skull: No acute fracture or suspicious osseous lesion.

Other: None.

CT MAXILLOFACIAL FINDINGS

Osseous: No fracture or mandibular dislocation. No destructive
process.

Orbits: Negative. No traumatic or inflammatory finding. Status post
bilateral lens replacements.

Sinuses: Mucous retention cyst in the right maxillary sinus.
Otherwise clear.

Soft tissues: Left frontal scalp hematoma, extending to the left
periorbital region.
IMPRESSION: 1.  No acute intracranial process.
2. No acute facial bone fracture.
3. Hematoma in the left frontal scalp and periorbital soft tissues.

## 2021-06-13 NOTE — ED Triage Notes (Addendum)
Pt reports falling from a sitting position into a wooden potato bin, striking the left eye/forehead; large hematoma noted; pt denies LOC or other injuries, a&o x 4; denies taking thinners ? ?

## 2021-06-14 MED ORDER — ACETAMINOPHEN 325 MG PO TABS
650.0000 mg | ORAL_TABLET | Freq: Once | ORAL | Status: AC
Start: 2021-06-14 — End: 2021-06-14
  Administered 2021-06-14: 650 mg via ORAL
  Filled 2021-06-14: qty 2

## 2021-06-14 NOTE — Discharge Instructions (Signed)
Apply ice for 30 minutes at a time, 4 times a day. ? ?The swelling will probably take 3-4 weeks to go down completely.  The black and blue will also take a similar amount of time to clear up. ? ?It is okay to take acetaminophen or ibuprofen as needed for pain. ?

## 2021-06-14 NOTE — ED Provider Notes (Signed)
?[facility] ?Provider Note ? ? ?CSN: [id number] ?Arrival date & time: 06/13/21  2103 ? ?  ? ?History ? ?Chief Complaint  ?Patient presents with  ? Fall  ? ? ?Patricia English is a 88 y.o. female. ? ?The history is provided by the patient.  ?Fall ?She has history of hypertension, diabetes, coronary artery disease and comes in following a fall at home.  She slid off of her chair and thinks she hit her forehead on a potato bin.  There was no loss of consciousness but she did have a lot of swelling.  She denies any nausea or vomiting.  She is not on any anticoagulants or antiplatelet agents. ?  ?Home Medications ?Prior to Admission medications   ?Medication Sig Start Date End Date Taking? Authorizing Provider  ?Cholecalciferol 25 MCG (1000 UT) tablet Take 1,000 Units by mouth daily.    [provider]  ?Coenzyme Q10 100 MG TABS Take 100 mg by mouth daily.    [provider]  ?diltiazem (DILACOR XR) 180 MG 24 hr capsule Take 1 capsule (180 mg total) by mouth daily. 11/01/20   [name], MD  ?furosemide (LASIX) 40 MG tablet Take 1 tablet (40 mg total) by mouth every Monday, Wednesday, and Friday. 11/02/20   [name], MD  ?glipiZIDE (GLUCOTROL XL) 5 MG 24 hr tablet Take 5 mg by mouth every evening.     [provider]  ?isosorbide mononitrate (IMDUR) 30 MG 24 hr tablet Take 30 mg by mouth every evening.     [provider]  ?metFORMIN (GLUCOPHAGE) 500 MG tablet Take 250 mg by mouth 2 (two) times daily with a meal.    [provider]  ?metoprolol (LOPRESSOR) 50 MG tablet Take 25 mg by mouth 2 (two) times daily.     [provider]  ?ondansetron (ZOFRAN) 4 MG tablet Take 1 tablet (4 mg total) by mouth every 6 (six) hours. 01/20/21   Smoot, Sarah A, PA-C  ?pantoprazole (PROTONIX) 40 MG tablet Take 1 tablet (40 mg total) by mouth 2 (two) times daily. 11/01/20   [name], MD  ?pravastatin (PRAVACHOL) 40 MG tablet Take 40 mg by  mouth daily. 06/06/17   [provider]  ?   ? ?Allergies    ?Lisinopril   ? ?Review of Systems   ?Review of Systems  ?All other systems reviewed and are negative. ? ?Physical Exam ?Updated Vital Signs ?BP (!) 144/95 (BP Location: Right Arm)   Pulse 86   Temp 97.9 ?F (36.6 ?C) (Oral)   Resp 17   Ht '5\' 7"'$  (1.702 m)   Wt 76.7 kg   SpO2 100%   BMI 26.47 kg/m?  ?Physical Exam ?Vitals and nursing note reviewed.  ?88 year old female, resting comfortably and in no acute distress. Vital signs are significant for elevated blood pressure. Oxygen saturation is 100%, which is normal. ?Head is normocephalic and atraumatic. PERRLA, EOMI. Oropharynx is clear. ?Neck is nontender and supple without adenopathy or JVD. ?Back is nontender and there is no CVA tenderness. ?Lungs are clear without rales, wheezes, or rhonchi. ?Chest is nontender. ?Heart has regular rate and rhythm without murmur. ?Abdomen is soft, flat, nontender without masses or hepatosplenomegaly and peristalsis is normoactive. ?Extremities have no cyanosis or edema, full range of motion is present. ?Skin is warm and dry without rash. ?Neurologic: Mental status is normal, cranial nerves are intact, there are no motor or sensory deficits. ? ?ED Results / Procedures /  Treatments   ? ?Radiology ?CT Head Wo Contrast ? ?Result Date: 06/13/2021 ?CLINICAL DATA:  Head trauma EXAM: CT HEAD WITHOUT CONTRAST CT MAXILLOFACIAL WITHOUT CONTRAST TECHNIQUE: Multidetector CT imaging of the head and maxillofacial structures were performed using the standard protocol without intravenous contrast. Multiplanar CT image reconstructions of the maxillofacial structures were also generated. RADIATION DOSE REDUCTION: This exam was performed according to the departmental dose-optimization program which includes automated exposure control, adjustment of the mA and/or kV according to patient size and/or use of iterative reconstruction technique. COMPARISON:  None. FINDINGS: CT HEAD  FINDINGS Brain: No evidence of acute infarction, hemorrhage, hydrocephalus, extra-axial collection or mass lesion/mass effect. Vascular: Hyperdense vessel. Skull: No acute fracture or suspicious osseous lesion. Other: None. CT MAXILLOFACIAL FINDINGS Osseous: No fracture or mandibular dislocation. No destructive process. Orbits: Negative. No traumatic or inflammatory finding. Status post bilateral lens replacements. Sinuses: Mucous retention cyst in the right maxillary sinus. Otherwise clear. Soft tissues: Left frontal scalp hematoma, extending to the left periorbital region. IMPRESSION: 1.  No acute intracranial process. 2. No acute facial bone fracture. 3. Hematoma in the left frontal scalp and periorbital soft tissues. Electronically Signed   By: Merilyn Baba M.D.   On: 06/13/2021 22:27  ? ?CT Cervical Spine Wo Contrast ? ?Result Date: 06/13/2021 ?CLINICAL DATA:  Trauma. EXAM: CT CERVICAL SPINE WITHOUT CONTRAST TECHNIQUE: Multidetector CT imaging of the cervical spine was performed without intravenous contrast. Multiplanar CT image reconstructions were also generated. RADIATION DOSE REDUCTION: This exam was performed according to the departmental dose-optimization program which includes automated exposure control, adjustment of the mA and/or kV according to patient size and/or use of iterative reconstruction technique. COMPARISON:  None. FINDINGS: Alignment: Normal. Skull base and vertebrae: No acute fracture. No primary bone lesion or focal pathologic process. Soft tissues and spinal canal: No prevertebral fluid or swelling. No visible canal hematoma. Disc levels: There is disc space narrowing and endplate osteophyte formation throughout the cervical spine most significant at C5-C6 and C6-C7. There is no severe central canal or neural foraminal stenosis at any level. Upper chest: Negative. Other: The thyroid gland is moderately diffusely enlarged and contains numerous nodules, some of which have calcifications.  Is nodules are not well defined on noncontrast CT. IMPRESSION: 1. No evidence for acute fracture or malalignment. 2. Mild multilevel degenerative changes. 3. Multinodular goiter. Recommend clinical correlation and follow-up. Electronically Signed   By: Ronney Asters M.D.   On: 06/13/2021 22:26  ? ?CT Maxillofacial Wo Contrast ? ?Result Date: 06/13/2021 ?CLINICAL DATA:  Head trauma EXAM: CT HEAD WITHOUT CONTRAST CT MAXILLOFACIAL WITHOUT CONTRAST TECHNIQUE: Multidetector CT imaging of the head and maxillofacial structures were performed using the standard protocol without intravenous contrast. Multiplanar CT image reconstructions of the maxillofacial structures were also generated. RADIATION DOSE REDUCTION: This exam was performed according to the departmental dose-optimization program which includes automated exposure control, adjustment of the mA and/or kV according to patient size and/or use of iterative reconstruction technique. COMPARISON:  None. FINDINGS: CT HEAD FINDINGS Brain: No evidence of acute infarction, hemorrhage, hydrocephalus, extra-axial collection or mass lesion/mass effect. Vascular: Hyperdense vessel. Skull: No acute fracture or suspicious osseous lesion. Other: None. CT MAXILLOFACIAL FINDINGS Osseous: No fracture or mandibular dislocation. No destructive process. Orbits: Negative. No traumatic or inflammatory finding. Status post bilateral lens replacements. Sinuses: Mucous retention cyst in the right maxillary sinus. Otherwise clear. Soft tissues: Left frontal scalp hematoma, extending to the left periorbital region. IMPRESSION: 1.  No acute intracranial process. 2. No acute  facial bone fracture. 3. Hematoma in the left frontal scalp and periorbital soft tissues. Electronically Signed   By: Merilyn Baba M.D.   On: 06/13/2021 22:27   ? ?Procedures ?Procedures  ? ? ?Medications Ordered in ED ?Medications - No data to display ? ?ED Course/ Medical Decision Making/ A&P ?  ?                         ?Medical Decision Making ? ?Left periorbital contusion.  She is sent for CT of head, maxillofacial bones, cervical spine to look for evidence of orbital fracture, intracranial injury or cervical spine fracture.  C

## 2021-06-16 ENCOUNTER — Emergency Department (HOSPITAL_COMMUNITY): Payer: Medicare Other

## 2021-06-16 ENCOUNTER — Inpatient Hospital Stay (HOSPITAL_COMMUNITY)
Admission: EM | Admit: 2021-06-16 | Discharge: 2021-06-21 | DRG: 065 | Disposition: A | Discharge status: Rehabilitation Facility | Payer: Medicare Other | Attending: Cardiovascular Disease | Admitting: Cardiovascular Disease

## 2021-06-16 ENCOUNTER — Other Ambulatory Visit: Payer: Self-pay

## 2021-06-16 DIAGNOSIS — I69328 Other speech and language deficits following cerebral infarction: Secondary | ICD-10-CM | POA: Diagnosis not present

## 2021-06-16 DIAGNOSIS — I454 Nonspecific intraventricular block: Secondary | ICD-10-CM | POA: Diagnosis present

## 2021-06-16 DIAGNOSIS — I63511 Cerebral infarction due to unspecified occlusion or stenosis of right middle cerebral artery: Secondary | ICD-10-CM | POA: Diagnosis present

## 2021-06-16 DIAGNOSIS — I480 Paroxysmal atrial fibrillation: Secondary | ICD-10-CM | POA: Diagnosis present

## 2021-06-16 DIAGNOSIS — R4781 Slurred speech: Secondary | ICD-10-CM | POA: Diagnosis present

## 2021-06-16 DIAGNOSIS — Z7984 Long term (current) use of oral hypoglycemic drugs: Secondary | ICD-10-CM

## 2021-06-16 DIAGNOSIS — K648 Other hemorrhoids: Secondary | ICD-10-CM | POA: Diagnosis present

## 2021-06-16 DIAGNOSIS — N1831 Chronic kidney disease, stage 3a: Secondary | ICD-10-CM | POA: Diagnosis present

## 2021-06-16 DIAGNOSIS — E785 Hyperlipidemia, unspecified: Secondary | ICD-10-CM | POA: Diagnosis present

## 2021-06-16 DIAGNOSIS — Z8719 Personal history of other diseases of the digestive system: Secondary | ICD-10-CM | POA: Diagnosis not present

## 2021-06-16 DIAGNOSIS — I082 Rheumatic disorders of both aortic and tricuspid valves: Secondary | ICD-10-CM | POA: Diagnosis present

## 2021-06-16 DIAGNOSIS — T45515S Adverse effect of anticoagulants, sequela: Secondary | ICD-10-CM | POA: Diagnosis not present

## 2021-06-16 DIAGNOSIS — E1149 Type 2 diabetes mellitus with other diabetic neurological complication: Secondary | ICD-10-CM | POA: Diagnosis not present

## 2021-06-16 DIAGNOSIS — R29810 Facial weakness: Secondary | ICD-10-CM | POA: Diagnosis present

## 2021-06-16 DIAGNOSIS — S0012XD Contusion of left eyelid and periocular area, subsequent encounter: Secondary | ICD-10-CM | POA: Diagnosis not present

## 2021-06-16 DIAGNOSIS — R531 Weakness: Secondary | ICD-10-CM | POA: Diagnosis present

## 2021-06-16 DIAGNOSIS — R2681 Unsteadiness on feet: Secondary | ICD-10-CM | POA: Diagnosis present

## 2021-06-16 DIAGNOSIS — Z79899 Other long term (current) drug therapy: Secondary | ICD-10-CM

## 2021-06-16 DIAGNOSIS — I129 Hypertensive chronic kidney disease with stage 1 through stage 4 chronic kidney disease, or unspecified chronic kidney disease: Secondary | ICD-10-CM | POA: Diagnosis present

## 2021-06-16 DIAGNOSIS — E1122 Type 2 diabetes mellitus with diabetic chronic kidney disease: Secondary | ICD-10-CM | POA: Diagnosis present

## 2021-06-16 DIAGNOSIS — N189 Chronic kidney disease, unspecified: Secondary | ICD-10-CM | POA: Diagnosis present

## 2021-06-16 DIAGNOSIS — I639 Cerebral infarction, unspecified: Secondary | ICD-10-CM | POA: Diagnosis present

## 2021-06-16 DIAGNOSIS — I4891 Unspecified atrial fibrillation: Secondary | ICD-10-CM | POA: Diagnosis present

## 2021-06-16 DIAGNOSIS — Z888 Allergy status to other drugs, medicaments and biological substances status: Secondary | ICD-10-CM | POA: Diagnosis not present

## 2021-06-16 DIAGNOSIS — I2510 Atherosclerotic heart disease of native coronary artery without angina pectoris: Secondary | ICD-10-CM | POA: Diagnosis present

## 2021-06-16 DIAGNOSIS — E8809 Other disorders of plasma-protein metabolism, not elsewhere classified: Secondary | ICD-10-CM | POA: Diagnosis present

## 2021-06-16 DIAGNOSIS — E663 Overweight: Secondary | ICD-10-CM | POA: Diagnosis present

## 2021-06-16 DIAGNOSIS — Y92009 Unspecified place in unspecified non-institutional (private) residence as the place of occurrence of the external cause: Secondary | ICD-10-CM | POA: Diagnosis not present

## 2021-06-16 DIAGNOSIS — K148 Other diseases of tongue: Secondary | ICD-10-CM | POA: Diagnosis present

## 2021-06-16 DIAGNOSIS — R32 Unspecified urinary incontinence: Secondary | ICD-10-CM | POA: Diagnosis present

## 2021-06-16 DIAGNOSIS — Z7901 Long term (current) use of anticoagulants: Secondary | ICD-10-CM | POA: Diagnosis not present

## 2021-06-16 DIAGNOSIS — W07XXXA Fall from chair, initial encounter: Secondary | ICD-10-CM | POA: Diagnosis present

## 2021-06-16 DIAGNOSIS — S0012XA Contusion of left eyelid and periocular area, initial encounter: Secondary | ICD-10-CM | POA: Diagnosis present

## 2021-06-16 DIAGNOSIS — G8194 Hemiplegia, unspecified affecting left nondominant side: Secondary | ICD-10-CM | POA: Diagnosis present

## 2021-06-16 DIAGNOSIS — I69354 Hemiplegia and hemiparesis following cerebral infarction affecting left non-dominant side: Secondary | ICD-10-CM | POA: Diagnosis present

## 2021-06-16 DIAGNOSIS — S0083XA Contusion of other part of head, initial encounter: Secondary | ICD-10-CM | POA: Diagnosis present

## 2021-06-16 DIAGNOSIS — I4811 Longstanding persistent atrial fibrillation: Secondary | ICD-10-CM | POA: Diagnosis not present

## 2021-06-16 DIAGNOSIS — W19XXXD Unspecified fall, subsequent encounter: Secondary | ICD-10-CM | POA: Diagnosis present

## 2021-06-16 DIAGNOSIS — I3139 Other pericardial effusion (noninflammatory): Secondary | ICD-10-CM | POA: Diagnosis present

## 2021-06-16 DIAGNOSIS — I6523 Occlusion and stenosis of bilateral carotid arteries: Secondary | ICD-10-CM | POA: Diagnosis not present

## 2021-06-16 DIAGNOSIS — Z6825 Body mass index (BMI) 25.0-25.9, adult: Secondary | ICD-10-CM | POA: Diagnosis not present

## 2021-06-16 DIAGNOSIS — I69392 Facial weakness following cerebral infarction: Secondary | ICD-10-CM | POA: Diagnosis not present

## 2021-06-16 LAB — I-STAT CHEM 8, ED
BUN: 28 mg/dL — ABNORMAL HIGH (ref 8–23)
Calcium, Ion: 1.12 mmol/L — ABNORMAL LOW (ref 1.15–1.40)
Chloride: 106 mmol/L (ref 98–111)
Creatinine, Ser: 1.5 mg/dL — ABNORMAL HIGH (ref 0.44–1.00)
Glucose, Bld: 124 mg/dL — ABNORMAL HIGH (ref 70–99)
HCT: 40 % (ref 36.0–46.0)
Hemoglobin: 13.6 g/dL (ref 12.0–15.0)
Potassium: 3.7 mmol/L (ref 3.5–5.1)
Sodium: 143 mmol/L (ref 135–145)
TCO2: 27 mmol/L (ref 22–32)

## 2021-06-16 LAB — COMPREHENSIVE METABOLIC PANEL
ALT: 9 U/L (ref 0–44)
AST: 29 U/L (ref 15–41)
Albumin: 4 g/dL (ref 3.5–5.0)
Alkaline Phosphatase: 75 U/L (ref 38–126)
Anion gap: 9 (ref 5–15)
BUN: 22 mg/dL (ref 8–23)
CO2: 22 mmol/L (ref 22–32)
Calcium: 9.1 mg/dL (ref 8.9–10.3)
Chloride: 109 mmol/L (ref 98–111)
Creatinine, Ser: 1.61 mg/dL — ABNORMAL HIGH (ref 0.44–1.00)
GFR, Estimated: 30 mL/min — ABNORMAL LOW (ref >60)
Glucose, Bld: 120 mg/dL — ABNORMAL HIGH (ref 70–99)
Potassium: 3.9 mmol/L (ref 3.5–5.1)
Sodium: 140 mmol/L (ref 135–145)
Total Bilirubin: 1.7 mg/dL — ABNORMAL HIGH (ref 0.3–1.2)
Total Protein: 7.7 g/dL (ref 6.5–8.1)

## 2021-06-16 LAB — CBC
HCT: 40.6 % (ref 36.0–46.0)
Hemoglobin: 13.1 g/dL (ref 12.0–15.0)
MCH: 28.6 pg (ref 26.0–34.0)
MCHC: 32.3 g/dL (ref 30.0–36.0)
MCV: 88.6 fL (ref 80.0–100.0)
Platelets: 250 10*3/uL (ref 150–400)
RBC: 4.58 MIL/uL (ref 3.87–5.11)
RDW: 13.6 % (ref 11.5–15.5)
WBC: 10.5 10*3/uL (ref 4.0–10.5)
nRBC: 0 % (ref 0.0–0.2)

## 2021-06-16 LAB — DIFFERENTIAL
Abs Immature Granulocytes: 0.02 10*3/uL (ref 0.00–0.07)
Basophils Absolute: 0 10*3/uL (ref 0.0–0.1)
Basophils Relative: 0 %
Eosinophils Absolute: 0.1 10*3/uL (ref 0.0–0.5)
Eosinophils Relative: 1 %
Immature Granulocytes: 0 %
Lymphocytes Relative: 21 %
Lymphs Abs: 2.2 10*3/uL (ref 0.7–4.0)
Monocytes Absolute: 0.8 10*3/uL (ref 0.1–1.0)
Monocytes Relative: 8 %
Neutro Abs: 7.4 10*3/uL (ref 1.7–7.7)
Neutrophils Relative %: 70 %

## 2021-06-16 LAB — URINALYSIS, ROUTINE W REFLEX MICROSCOPIC
Bilirubin Urine: NEGATIVE
Glucose, UA: NEGATIVE mg/dL
Ketones, ur: NEGATIVE mg/dL
Leukocytes,Ua: NEGATIVE
Nitrite: POSITIVE — AB
Protein, ur: NEGATIVE mg/dL
Specific Gravity, Urine: 1.006 (ref 1.005–1.030)
pH: 5 (ref 5.0–8.0)

## 2021-06-16 LAB — RAPID URINE DRUG SCREEN, HOSP PERFORMED
Amphetamines: NOT DETECTED
Barbiturates: NOT DETECTED
Benzodiazepines: NOT DETECTED
Cocaine: NOT DETECTED
Opiates: NOT DETECTED
Tetrahydrocannabinol: NOT DETECTED

## 2021-06-16 LAB — APTT: aPTT: 26 seconds (ref 24–36)

## 2021-06-16 LAB — PROTIME-INR
INR: 1.1 (ref 0.8–1.2)
Prothrombin Time: 14 seconds (ref 11.4–15.2)

## 2021-06-16 LAB — ETHANOL: Alcohol, Ethyl (B): <10 mg/dL (ref <10)

## 2021-06-16 IMAGING — CT CT HEAD W/O CM
4 series · 16 of 47 positions shown, 18 images · non-contrast
Comparison: [DATE]

CLINICAL DATA: Neurological deficit



[Series 3: head wo · axial · 0.44mm/px · z∈[-88,+32]mm · 7 of 34 slices shown, 9 images]
[im 5/34  brain]
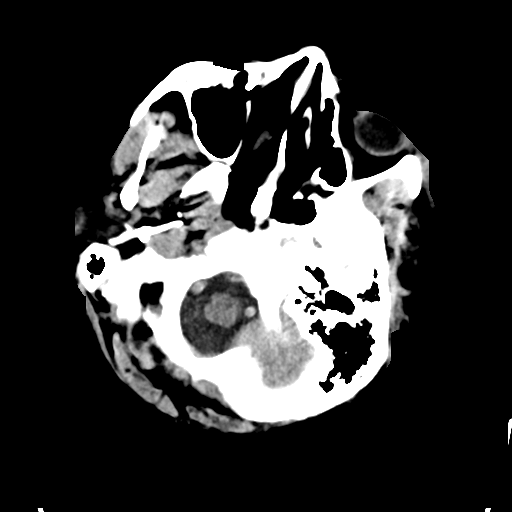
[im 5/34  bone]
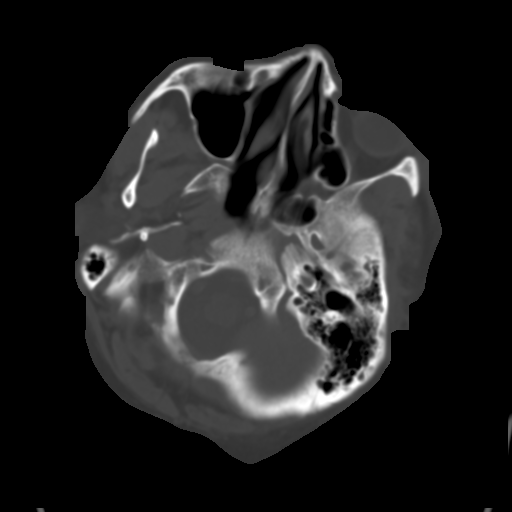
[im 9/34  brain]
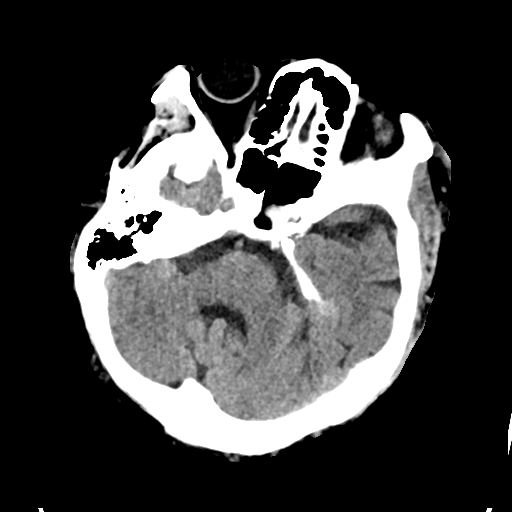
[im 13/34  brain]
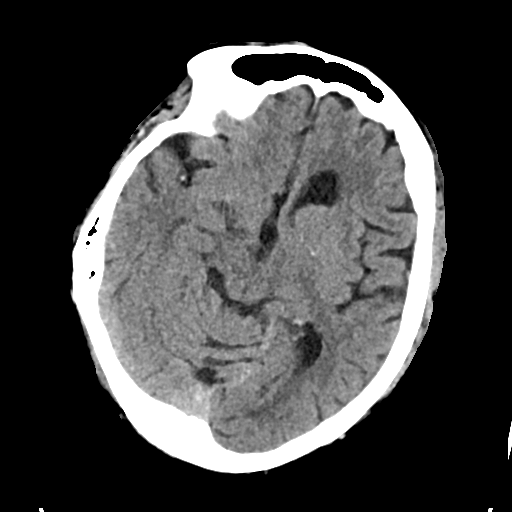
[im 17/34  brain]
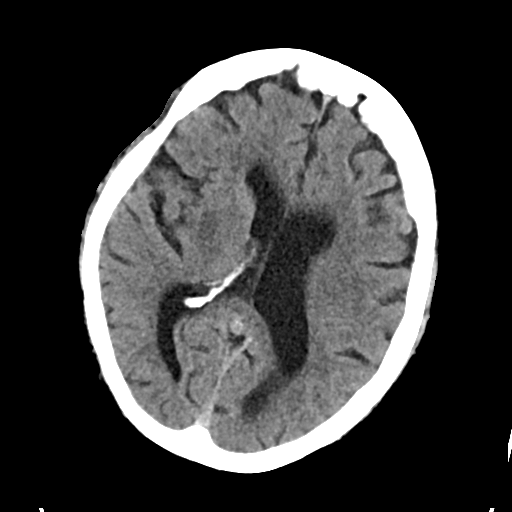
[im 21/34  brain]
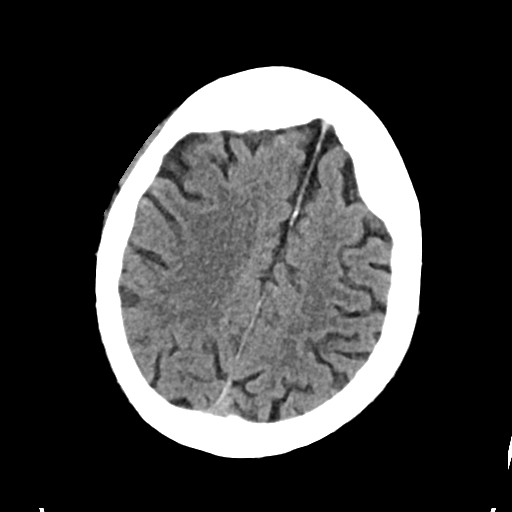
[im 21/34  bone]
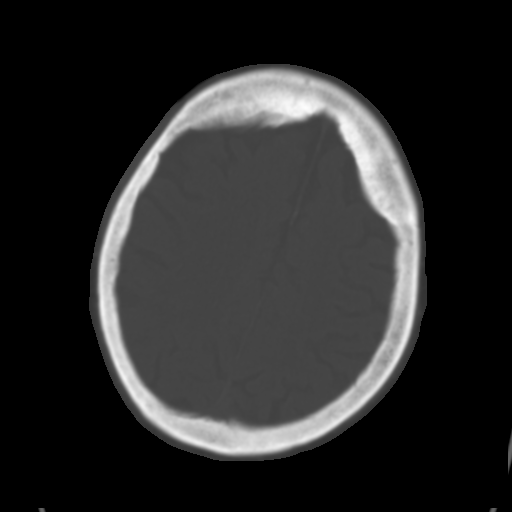
[im 25/34  brain]
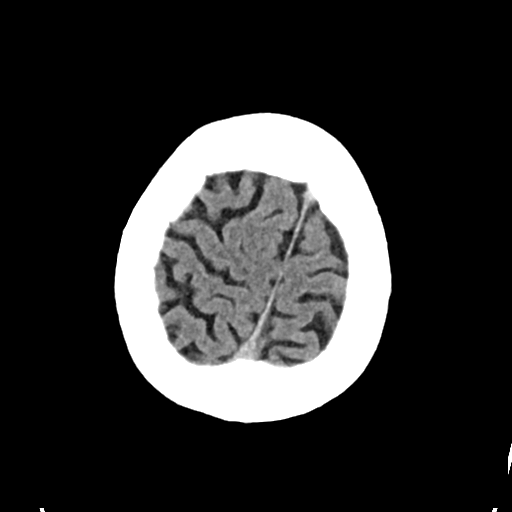
[im 29/34  brain]
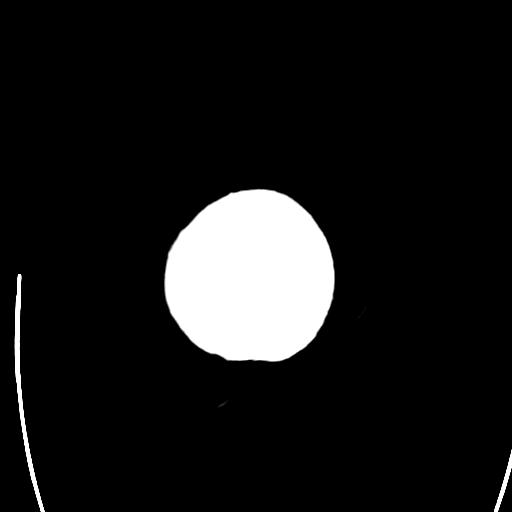

[Series 4: head bone · axial · 0.44mm/px · z∈[-92,-60]mm · 3 of 83 slices shown]
[im 9/83  bone]
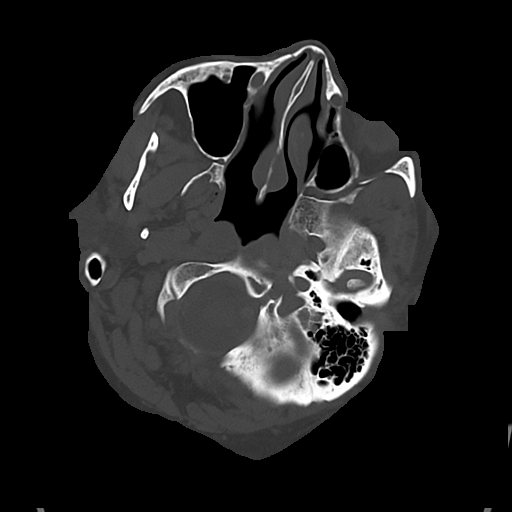
[im 17/83  bone]
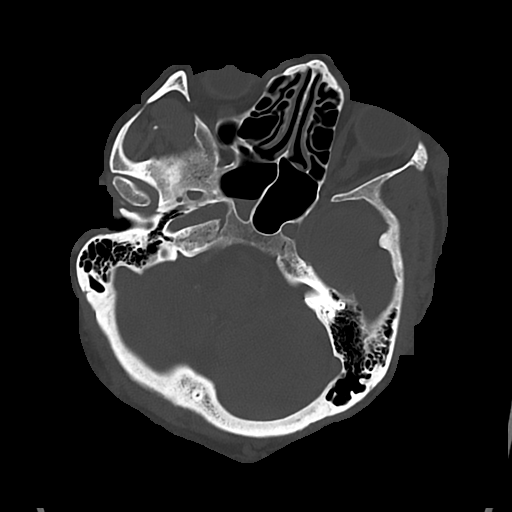
[im 25/83  bone]
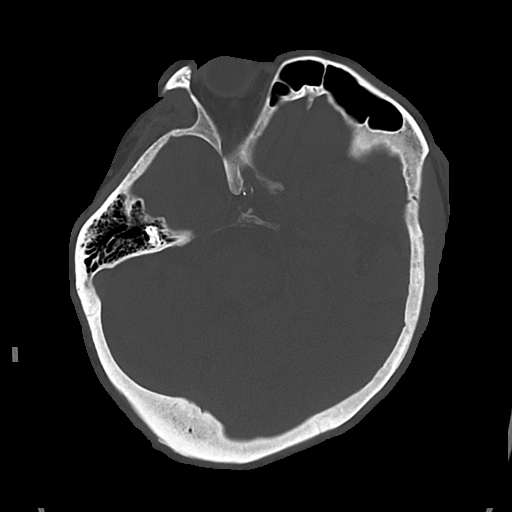

[Series 5: cor soft · coronal · 0.31mm/px · 3 of 68 slices shown]
[im 23/68  brain]
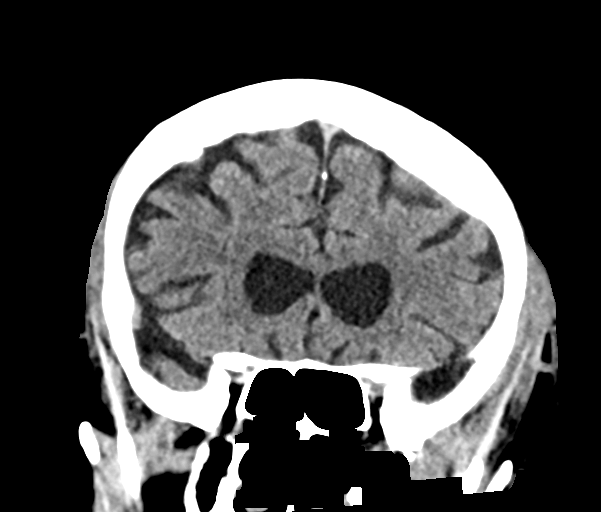
[im 30/68  brain]
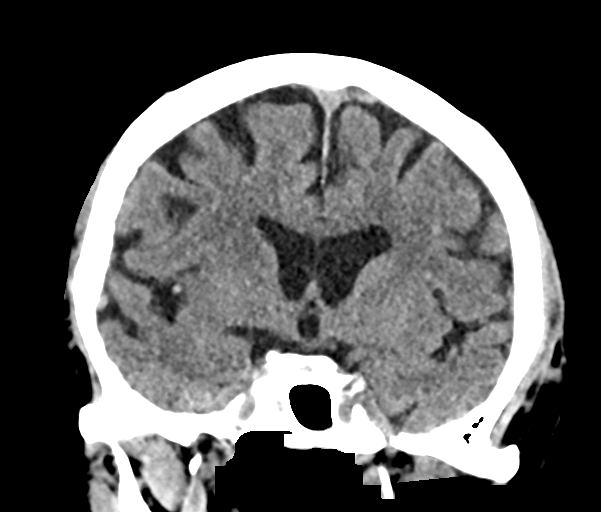
[im 38/68  brain]
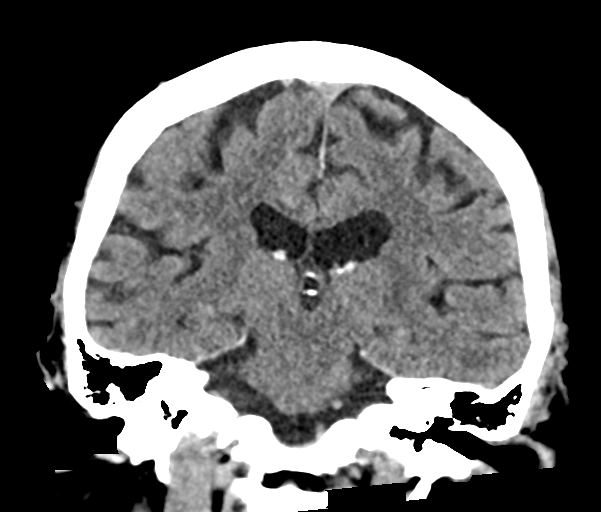

[Series 6: sag soft · sagittal · 0.31mm/px · 3 of 59 slices shown]
[im 20/59  brain]
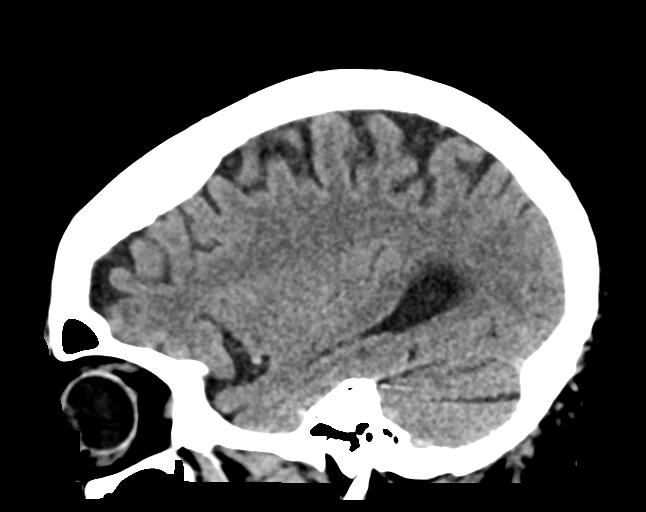
[im 30/59  brain]
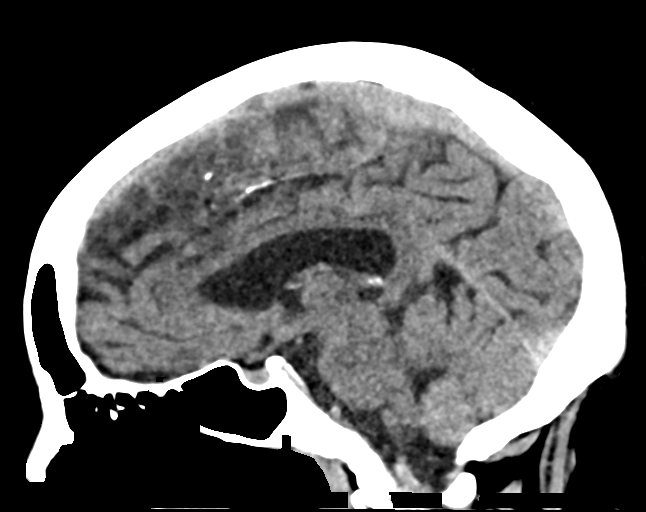
[im 39/59  brain]
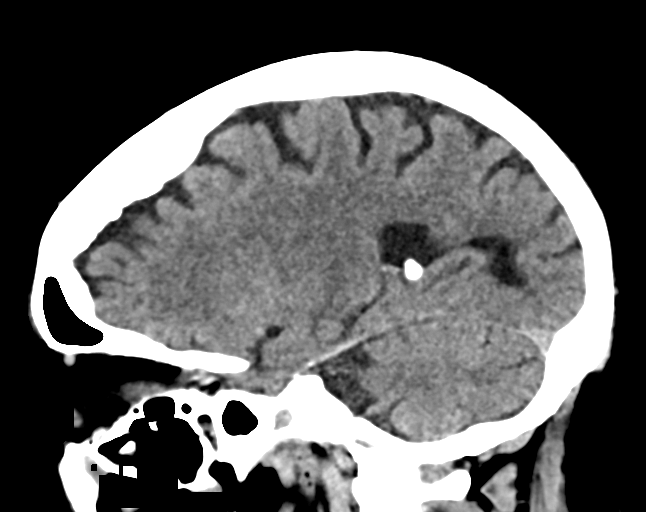

[16 of 47 positions shown; findings below may reference images not displayed]

FINDINGS: Brain: No acute intracranial findings are seen. There are no signs
of bleeding within the cranium. Cortical sulci are prominent. There
is decreased density in the periventricular and subcortical white
matter.

Vascular: Scattered arterial calcifications are seen.

Skull: No fracture is seen in the calvarium. Hyperostosis frontalis
interna is seen. There is interval decrease in left frontal scalp
hematoma.

Sinuses/Orbits: There is mild mucosal thickening in the ethmoid
sinus. There is mucous retention cyst in the right maxillary sinus.
There is mucosal thickening and possibly small air-fluid level in
the right side sphenoid sinus.

Other: None
IMPRESSION: No acute intracranial findings are seen.  Atrophy.

Chronic sinusitis.

## 2021-06-16 IMAGING — MR MR HEAD W/O CM
9 of 10 series · 37 of 48 positions shown · non-contrast
Comparison: None Available.

CLINICAL DATA: Neuro deficit, acute, stroke suspected

EXAM:
MRI HEAD WITHOUT CONTRAST
TECHNIQUE: Multiplanar, multiecho pulse sequences of the brain and surrounding
structures were obtained without intravenous contrast.

[Series 3: DWI · axial · 3.0mm · 1.09mm/px · z∈[-93,+66]mm · 11 of 110 slices shown (1 of 4)]
[im 1/110]
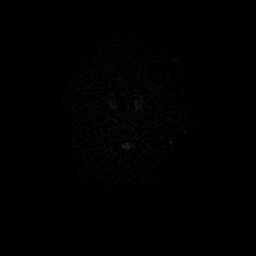
[im 11/110]
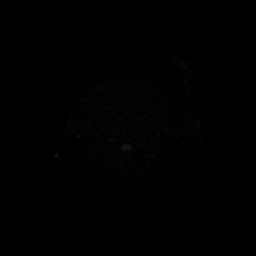
[im 22/110]
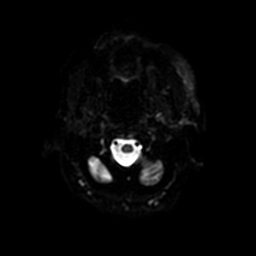
[im 33/110]
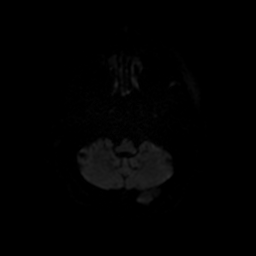
[im 44/110]
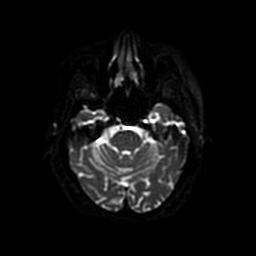
[im 55/110]
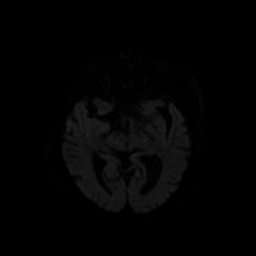
[im 66/110]
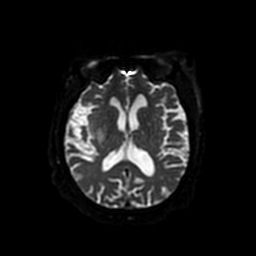
[im 77/110]
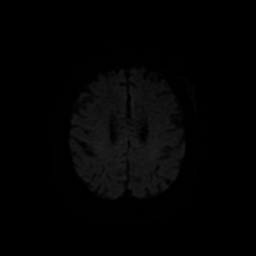
[im 88/110]
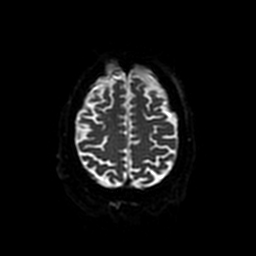
[im 99/110]
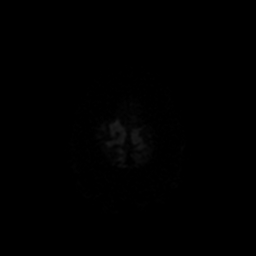
[im 110/110]
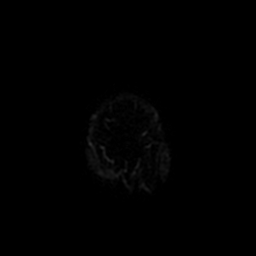

[Series 4: DWI · coronal · 5.0mm · 1.09mm/px · 7 of 76 slices shown (2 of 4)]
[im 1/76]
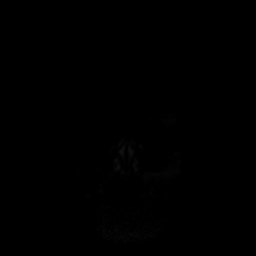
[im 13/76]
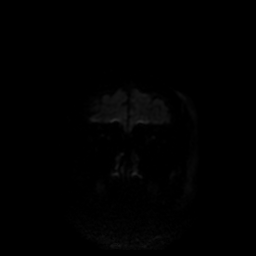
[im 26/76]
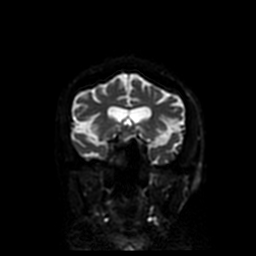
[im 38/76]
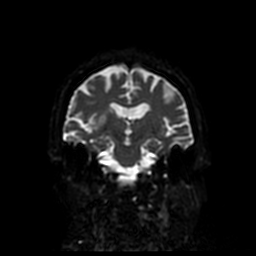
[im 51/76]
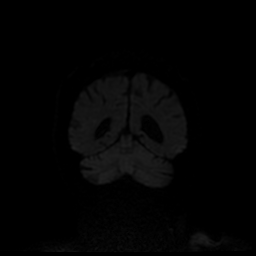
[im 63/76]
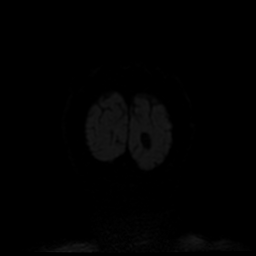
[im 76/76]
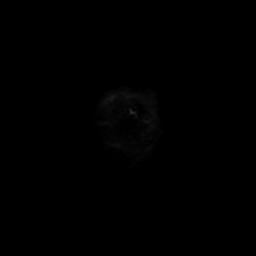

[Series 5: T1 · sagittal · 5.0mm · 0.47mm/px · 2 of 27 slices shown (1 of 2)]
[im 1/27]
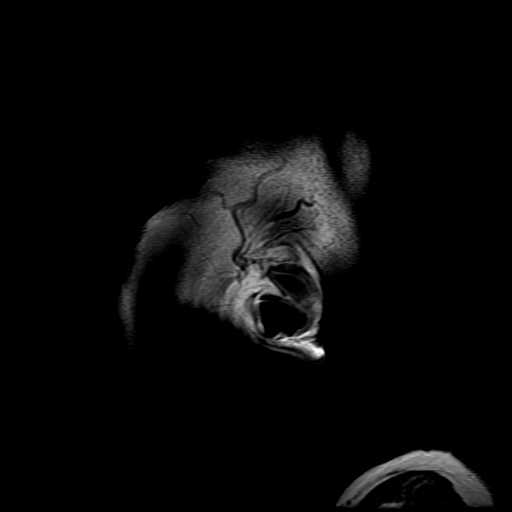
[im 27/27]
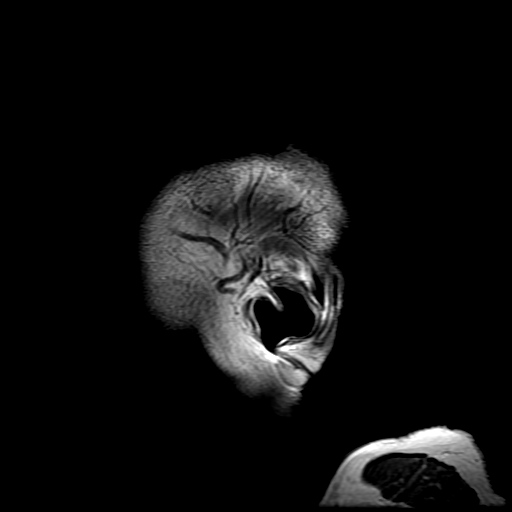

[Series 6: T2 · axial · 5.0mm · 0.45mm/px · z∈[-75,+72]mm · 2 of 26 slices shown (1 of 2)]
[im 1/26]
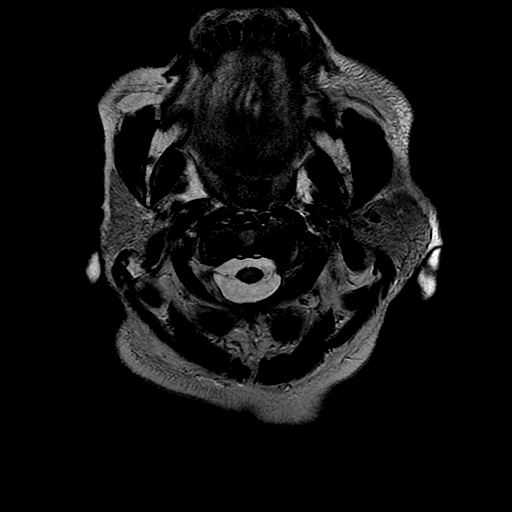
[im 26/26]
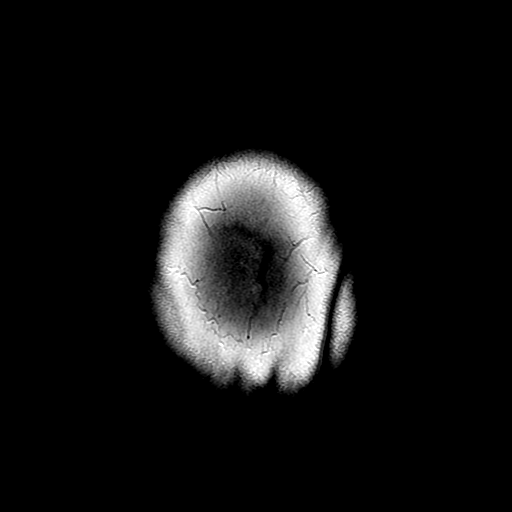

[Series 7: FLAIR · axial · 3.0mm · 0.45mm/px · z∈[-81,+78]mm · 3 of 28 slices shown]
[im 1/28]
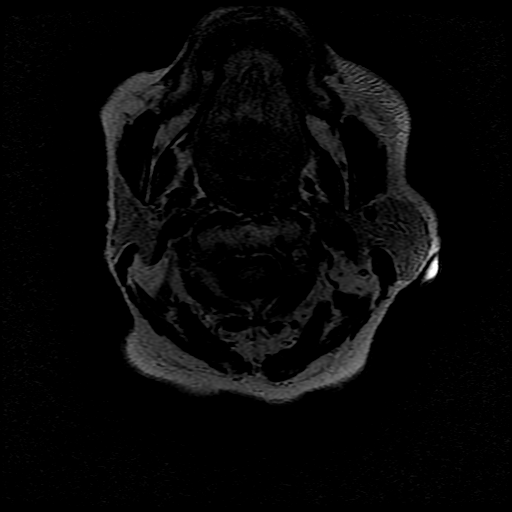
[im 14/28]
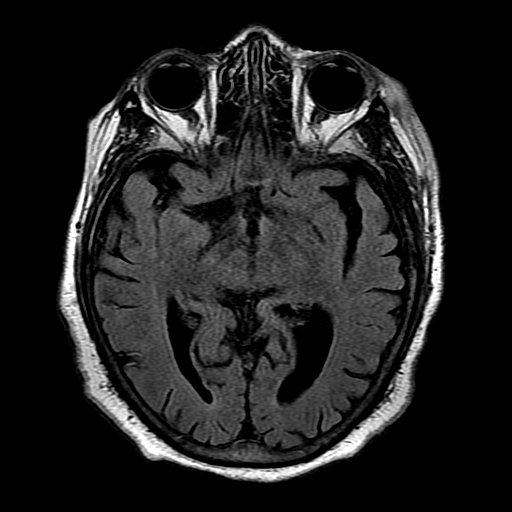
[im 28/28]
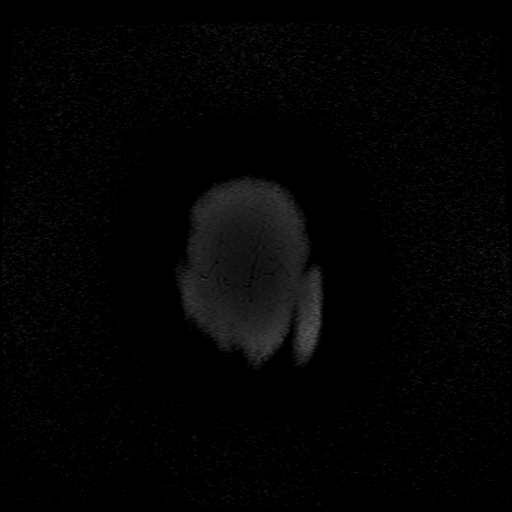

[Series 9: T1 · axial · 3.0mm · 0.47mm/px · 1 of 112 slices shown (2 of 2)]
[im 1/112]
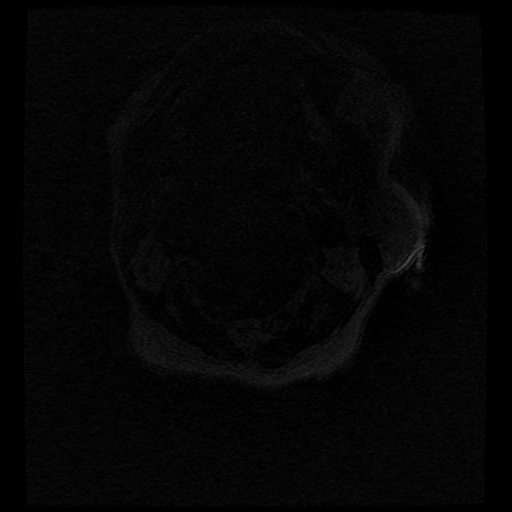

[Series 10: T2 · coronal · 5.0mm · 0.41mm/px · 3 of 30 slices shown (2 of 2)]
[im 1/30]
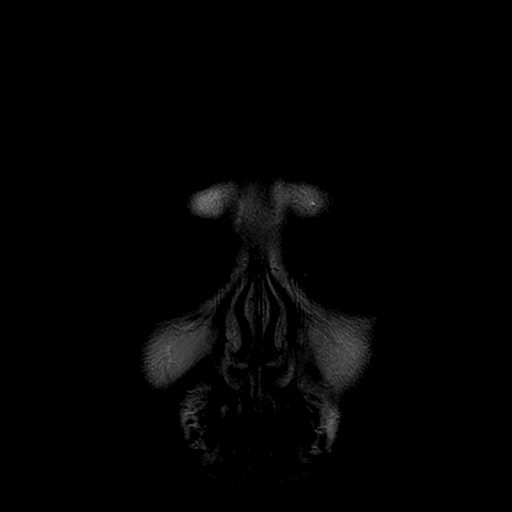
[im 15/30]
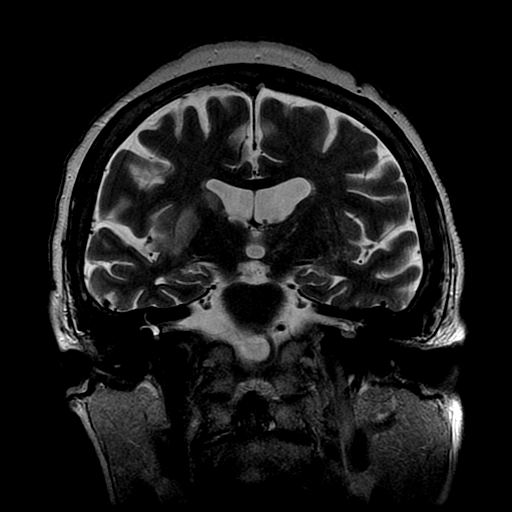
[im 30/30]
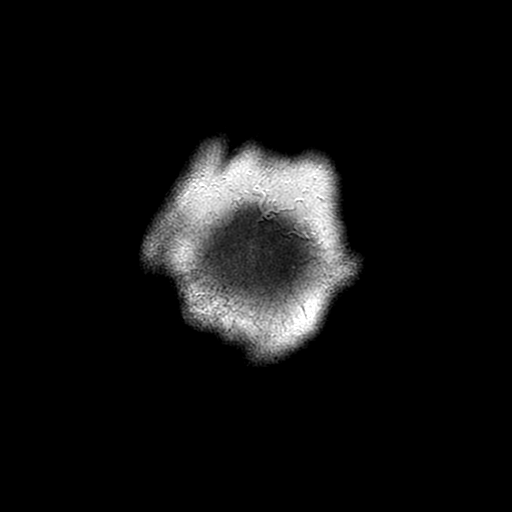

[Series 300: DWI · axial · 3.0mm · 1.09mm/px · z∈[-93,+66]mm · 5 of 55 slices shown (3 of 4)]
[im 1/55]
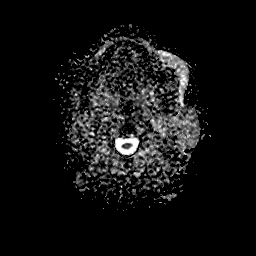
[im 14/55]
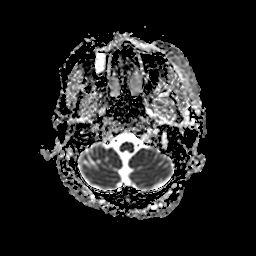
[im 28/55]
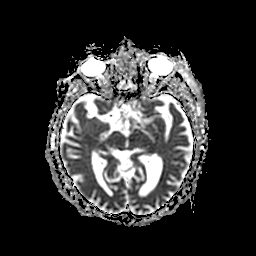
[im 41/55]
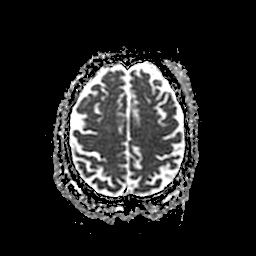
[im 55/55]
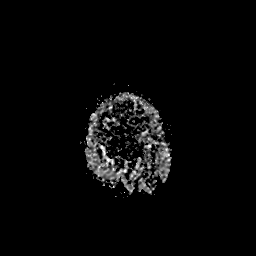

[Series 400: DWI · coronal · 5.0mm · 1.09mm/px · 3 of 38 slices shown (4 of 4)]
[im 1/38]
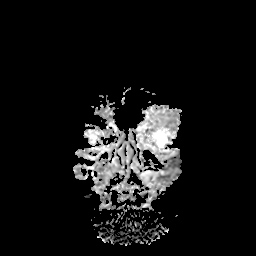
[im 19/38]
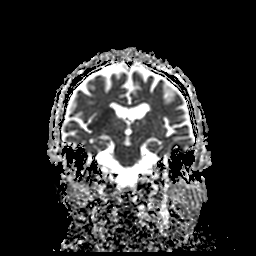
[im 38/38]
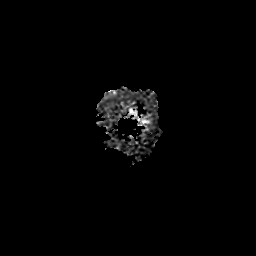

[37 of 48 positions shown; findings below may reference images not displayed]

FINDINGS: Brain: Acute infarcts in the right basal ganglia, right insula and
right parietal lobe. Associated edema without significant mass
effect. No midline shift. No evidence of acute hemorrhage,
hydrocephalus, mass lesion or extra-axial fluid collection. Cerebral
atrophy with ex vacuo ventricular dilation.

Vascular: Poor visualization of the distal right M1 MCA flow void.

Skull and upper cervical spine: Normal marrow signal.

Sinuses/Orbits: Inferior right maxillary sinus retention cyst. No
acute orbital findings.

Other: No sizable mastoid effusions.
IMPRESSION: 1. Acute infarcts in the right basal ganglia, right insula and right
parietal lobe. Associated edema without significant mass effect.
2. Poor visualization of the distal right M1 MCA flow void. This
could potentially be artifactual; however, given the above findings
recommend CTA to further evaluate for stenosis or occlusion.

## 2021-06-16 MED ORDER — HEPARIN SODIUM (PORCINE) 5000 UNIT/ML IJ SOLN
5000.0000 [IU] | Freq: Three times a day (TID) | INTRAMUSCULAR | Status: DC
  Administered 2021-06-16: 5000 [IU] via SUBCUTANEOUS
  Filled 2021-06-16: qty 1

## 2021-06-16 MED ORDER — STROKE: EARLY STAGES OF RECOVERY BOOK
Freq: Once | Status: DC
  Filled 2021-06-16: qty 1

## 2021-06-16 MED ORDER — ACETAMINOPHEN 650 MG RE SUPP: 650.0000 mg | RECTAL | Status: DC | PRN

## 2021-06-16 MED ORDER — ACETAMINOPHEN 160 MG/5ML PO SOLN: 650.0000 mg | ORAL | Status: DC | PRN

## 2021-06-16 MED ORDER — LORAZEPAM 2 MG/ML IJ SOLN
0.5000 mg | Freq: Once | INTRAMUSCULAR | Status: AC | PRN
  Administered 2021-06-17: 0.5 mg via INTRAVENOUS
  Filled 2021-06-16: qty 1

## 2021-06-16 MED ORDER — SENNOSIDES-DOCUSATE SODIUM 8.6-50 MG PO TABS: 1.0000 | ORAL_TABLET | Freq: Every evening | ORAL | Status: DC | PRN

## 2021-06-16 MED ORDER — ACETAMINOPHEN 325 MG PO TABS: 650.0000 mg | ORAL_TABLET | ORAL | Status: DC | PRN

## 2021-06-16 MED ORDER — SODIUM CHLORIDE 0.9 % IV SOLN: INTRAVENOUS | Status: DC

## 2021-06-16 NOTE — ED Notes (Signed)
Patient transported to MRI 

## 2021-06-16 NOTE — Progress Notes (Signed)
Arrived to patient room. Patient is off the unit at this time. Secure chat sent to nurse to reconsult if PIV still needed when patient returns. Fran Lowes, RN VAST ?

## 2021-06-16 NOTE — Consult Note (Signed)
?                    NEURO HOSPITALIST CONSULT NOTE  ? ?Requestig physician: Dr. Diamond Nickel ? ?Reason for Consult: New onset of left sided weakness ? ?History obtained from:  Patient and Chart    ? ?HPI:                                                                                                                                         ? Patricia English is an 88 y.o. female with a PMHx of CAD, DM, HTN, PAF (not on anticoagulation due to GI bleeds) and recent evaluation on 4/30 after a sliding fall from a chair in which she struck her forehead and periorbital region without LOC, who presented to the ED this AM via EMS with new onset of left sided weakness, left facial droop and slurred speech first noted on awakening this AM. LKN was 2100 on Tuesday night. Exam by EDP revealed mild LUE and LLE weakness. CT showed no acute intracranial findings, with cerebral atrophy noted. Neurology has been consulted for stroke evaluation.  ? ?Past Medical History:  ?Diagnosis Date  ? Coronary artery disease   ? Diabetes mellitus without complication ([place])   ? GI bleeding 11/2017  ? Hypertension   ? Internal hemorrhoids   ? ? ?Past Surgical History:  ?Procedure Laterality Date  ? BIOPSY  10/30/2020  ? Procedure: BIOPSY;  Surgeon: Sharyn Creamer, MD;  Location: [facility];  Service: Gastroenterology;;  ? BIOPSY  10/31/2020  ? Procedure: BIOPSY;  Surgeon: Sharyn Creamer, MD;  Location: [facility] ENDOSCOPY;  Service: Gastroenterology;;  ? CATARACT EXTRACTION    ? COLONOSCOPY WITH PROPOFOL N/A 10/31/2020  ? Procedure: COLONOSCOPY WITH PROPOFOL;  Surgeon: Sharyn Creamer, MD;  Location: [facility];  Service: Gastroenterology;  Laterality: N/A;  ? ESOPHAGOGASTRODUODENOSCOPY (EGD) WITH PROPOFOL N/A 08/13/2017  ? Procedure: ESOPHAGOGASTRODUODENOSCOPY (EGD) WITH PROPOFOL;  Surgeon: [name], MD;  Location: [facility] ENDOSCOPY;  Service: Endoscopy;  Laterality: N/A;  ? ESOPHAGOGASTRODUODENOSCOPY (EGD) WITH PROPOFOL N/A 10/30/2020  ? Procedure:  ESOPHAGOGASTRODUODENOSCOPY (EGD) WITH PROPOFOL;  Surgeon: Sharyn Creamer, MD;  Location: [facility];  Service: Gastroenterology;  Laterality: N/A;  ? FLEXIBLE SIGMOIDOSCOPY N/A 12/10/2017  ? Procedure: FLEXIBLE SIGMOIDOSCOPY;  Surgeon: [name], MD;  Location: [facility] ENDOSCOPY;  Service: Endoscopy;  Laterality: N/A;  ? HEMORRHOID BANDING  12/10/2017  ? Procedure: HEMORRHOID BANDING;  Surgeon: [name], MD;  Location: [facility] ENDOSCOPY;  Service: Endoscopy;;  ? HEMOSTASIS CLIP PLACEMENT  10/31/2020  ? Procedure: HEMOSTASIS CLIP PLACEMENT;  Surgeon: Sharyn Creamer, MD;  Location: [facility];  Service: Gastroenterology;;  ? POLYPECTOMY  10/31/2020  ? Procedure: POLYPECTOMY;  Surgeon: Sharyn Creamer, MD;  Location: [facility];  Service: Gastroenterology;;  ? ? ?No family history on file.           ? ?  Social History:  reports that she has never smoked. She has never used smokeless tobacco. She reports that she does not drink alcohol and does not use drugs. ? ?Allergies  ?Allergen Reactions  ? Lisinopril   ?  This caused laryngeal edema  ? ? ?MEDICATIONS:                                                                                                                     ?No current facility-administered medications on file prior to encounter.  ? ?Current Outpatient Medications on File Prior to Encounter  ?Medication Sig Dispense Refill  ? Cholecalciferol 25 MCG (1000 UT) tablet Take 1,000 Units by mouth daily.    ? Coenzyme Q10 100 MG TABS Take 100 mg by mouth daily.    ? diltiazem (DILACOR XR) 180 MG 24 hr capsule Take 1 capsule (180 mg total) by mouth daily.    ? furosemide (LASIX) 40 MG tablet Take 1 tablet (40 mg total) by mouth every Monday, Wednesday, and Friday.    ? glipiZIDE (GLUCOTROL XL) 5 MG 24 hr tablet Take 5 mg by mouth every evening.     ? isosorbide mononitrate (IMDUR) 30 MG 24 hr tablet Take 30 mg by mouth every evening.     ? metFORMIN (GLUCOPHAGE) 500 MG tablet Take 250 mg by mouth 2 (two)  times daily with a meal.    ? metoprolol (LOPRESSOR) 50 MG tablet Take 25 mg by mouth 2 (two) times daily.     ? ondansetron (ZOFRAN) 4 MG tablet Take 1 tablet (4 mg total) by mouth every 6 (six) hours. 12 tablet 0  ? pantoprazole (PROTONIX) 40 MG tablet Take 1 tablet (40 mg total) by mouth 2 (two) times daily. 180 tablet 3  ? pravastatin (PRAVACHOL) 40 MG tablet Take 40 mg by mouth daily.  3  ? ? ?Scheduled: ?  stroke: early stages of recovery book   Does not apply Once  ? heparin  5,000 Units Subcutaneous Q8H  ? ?Continuous: ? sodium chloride    ? ? ?ROS:                                                                                                                                       ?As per HPI. The patient does not endorse any additional symptoms.  ? ? ?Blood pressure (!) 173/66, pulse 81, temperature 97.6 ?F (36.4 ?C), temperature  source Oral, resp. rate (!) 28, SpO2 100 %. ? ? ?General Examination:                                                                                                      ? ?Physical Exam  ?HEENT-  Normocephalic. Bruising to left periorbital region is noted. Left subconjunctival hemorrhage noted.  ?Lungs- Respirations unlabored ?Extremities- Warm and well perfused ? ? ?Neurological Examination ?Mental Status: Awake and alert. Speech mildly dysarthric with occasional word finding difficulties and hesitation. Able to name a thumb and pinky finger, but not an index finger. Made one error when asked to repeat a phrase. Comprehension for basic commands and questions is intact. Oriented to the city, state and day of the week but not the year or the month.  ?Cranial Nerves: ?II: Left homonymous hemianopsia.   ?III,IV, VI: EOM are full but with hesitancy tracking past the midline to the left as well as left sided gaze impersistence ?V: Temp sensation equal bilaterally ?VII: Left facial droop ?VIII: Hearing intact to voice ?IX,X: No hypophonia or hoarseness ?XI: Head position tends to be  rotated slightly to the right of midline ?XII: Mild leftward tongue deviation ?Motor: ?RUE and RLE 5/5 proximally and distally ?LUE 4/5 proximally and distally with mild bradykinesia and decreased spontaneous movements.  ?LLE 4+/5 proximally and distally.  ?Sensory: Subjectively with equal temp sensation bilateral arms and legs. FT subjectively intact to BUE and BLE, but there is extinction on the left to DSS.  ?Deep Tendon Reflexes: 1-2+ and symmetric throughout ?Cerebellar: No ataxia with FNF bilaterally, but slowed on the left.  ?Gait: Deferred ?  ?Lab Results: ?Basic Metabolic Panel: ?Recent Labs  ?Lab 06/16/21 ?1540 06/16/21 ?1552  ?NA 140 143  ?K 3.9 3.7  ?CL 109 106  ?CO2 22  --   ?GLUCOSE 120* 124*  ?BUN 22 28*  ?CREATININE 1.61* 1.50*  ?CALCIUM 9.1  --   ? ? ?CBC: ?Recent Labs  ?Lab 06/16/21 ?1540 06/16/21 ?1552  ?WBC 10.5  --   ?NEUTROABS 7.4  --   ?HGB 13.1 13.6  ?HCT 40.6 40.0  ?MCV 88.6  --   ?PLT 250  --   ? ? ?Cardiac Enzymes: ?No results for input(s): CKTOTAL, CKMB, CKMBINDEX, TROPONINI in the last 168 hours. ? ?Lipid Panel: ?No results for input(s): CHOL, TRIG, HDL, CHOLHDL, VLDL, LDLCALC in the last 168 hours. ? ?Imaging: ?CT HEAD WO CONTRAST ? ?Result Date: 06/16/2021 ?CLINICAL DATA:  Neurological deficit EXAM: CT HEAD WITHOUT CONTRAST TECHNIQUE: Contiguous axial images were obtained from the base of the skull through the vertex without intravenous contrast. RADIATION DOSE REDUCTION: This exam was performed according to the departmental dose-optimization program which includes automated exposure control, adjustment of the mA and/or kV according to patient size and/or use of iterative reconstruction technique. COMPARISON:  06/13/2021 FINDINGS: Brain: No acute intracranial findings are seen. There are no signs of bleeding within the cranium. Cortical sulci are prominent. There is decreased density in the periventricular and subcortical white matter. Vascular: Scattered arterial calcifications are  seen. Skull: No fracture is  seen in the calvarium. Hyperostosis frontalis interna is seen. There is interval decrease in left frontal scalp hematoma. Sinuses/Orbits: There is mild mucosal thickening in the ethmoid sinu

## 2021-06-16 NOTE — ED Triage Notes (Signed)
Pt BIB [facility] from home with reports of stroke like symptoms. LKW 06-15-21 2100. EMS reports left sided weakness, facial droop, and slightly slurred speech noted upon waking this am. Pt A&O at this time, NAD. ?

## 2021-06-16 NOTE — H&P (Signed)
Referring Physician:  ? ?Patricia English is an 88 y.o. female.                       ?Chief Complaint: Weakness and facial droop ? ?HPI: 88 years old female with PMH of CAD, type 2 DM, HTN, Atrial fibrillation, GI bleed and hemorrhoids has weakness and left sided facial droop since she woke up this AM. She was last seen normal last night before she she went to bed. She denies chest pain or palpitations. She is off anticoagulation due to recurrent GI bleed. ? ?Past Medical History:  ?Diagnosis Date  ? Coronary artery disease   ? Diabetes mellitus without complication ([place])   ? GI bleeding 11/2017  ? Hypertension   ? Internal hemorrhoids   ?  ? ? ?Past Surgical History:  ?Procedure Laterality Date  ? BIOPSY  10/30/2020  ? Procedure: BIOPSY;  Surgeon: Sharyn Creamer, MD;  Location: [place];  Service: Gastroenterology;;  ? BIOPSY  10/31/2020  ? Procedure: BIOPSY;  Surgeon: Sharyn Creamer, MD;  Location: [facility] ENDOSCOPY;  Service: Gastroenterology;;  ? CATARACT EXTRACTION    ? COLONOSCOPY WITH PROPOFOL N/A 10/31/2020  ? Procedure: COLONOSCOPY WITH PROPOFOL;  Surgeon: Sharyn Creamer, MD;  Location: [place];  Service: Gastroenterology;  Laterality: N/A;  ? ESOPHAGOGASTRODUODENOSCOPY (EGD) WITH PROPOFOL N/A 08/13/2017  ? Procedure: ESOPHAGOGASTRODUODENOSCOPY (EGD) WITH PROPOFOL;  Surgeon: [name], MD;  Location: [facility] ENDOSCOPY;  Service: Endoscopy;  Laterality: N/A;  ? ESOPHAGOGASTRODUODENOSCOPY (EGD) WITH PROPOFOL N/A 10/30/2020  ? Procedure: ESOPHAGOGASTRODUODENOSCOPY (EGD) WITH PROPOFOL;  Surgeon: Sharyn Creamer, MD;  Location: [place];  Service: Gastroenterology;  Laterality: N/A;  ? FLEXIBLE SIGMOIDOSCOPY N/A 12/10/2017  ? Procedure: FLEXIBLE SIGMOIDOSCOPY;  Surgeon: [name], MD;  Location: [facility] ENDOSCOPY;  Service: Endoscopy;  Laterality: N/A;  ? HEMORRHOID BANDING  12/10/2017  ? Procedure: HEMORRHOID BANDING;  Surgeon: [name], MD;  Location: [facility] ENDOSCOPY;  Service: Endoscopy;;  ?  HEMOSTASIS CLIP PLACEMENT  10/31/2020  ? Procedure: HEMOSTASIS CLIP PLACEMENT;  Surgeon: Sharyn Creamer, MD;  Location: [place];  Service: Gastroenterology;;  ? POLYPECTOMY  10/31/2020  ? Procedure: POLYPECTOMY;  Surgeon: Sharyn Creamer, MD;  Location: [place];  Service: Gastroenterology;;  ? ? ?No family history on file. ?Social History:  reports that she has never smoked. She has never used smokeless tobacco. She reports that she does not drink alcohol and does not use drugs. ? ?Allergies:  ?Allergies  ?Allergen Reactions  ? Lisinopril   ?  This caused laryngeal edema  ? ? ?(Not in a hospital admission) ? ? ?Results for orders placed or performed during the hospital encounter of 06/16/21 (from the past 48 hour(s))  ?Urinalysis, Routine w reflex microscopic Urine, Clean Catch     Status: Abnormal  ? Collection Time: 06/16/21 11:54 AM  ?Result Value Ref Range  ? Color, Urine STRAW (A) YELLOW  ? APPearance CLEAR CLEAR  ? Specific Gravity, Urine 1.006 1.005 - 1.030  ? pH 5.0 5.0 - 8.0  ? Glucose, UA NEGATIVE NEGATIVE mg/dL  ? Hgb urine dipstick SMALL (A) NEGATIVE  ? Bilirubin Urine NEGATIVE NEGATIVE  ? Ketones, ur NEGATIVE NEGATIVE mg/dL  ? Protein, ur NEGATIVE NEGATIVE mg/dL  ? Nitrite POSITIVE (A) NEGATIVE  ? Leukocytes,Ua NEGATIVE NEGATIVE  ? RBC / HPF 0-5 0 - 5 RBC/hpf  ? WBC, UA 0-5 0 - 5 WBC/hpf  ? Bacteria, UA FEW (A) NONE SEEN  ? Squamous  Epithelial / LPF 0-5 0 - 5  ?  Comment: Performed at [facility], [place] [address], [place], [place] [address]  ?Urine rapid drug screen (hosp performed)     Status: None  ? Collection Time: 06/16/21  1:08 PM  ?Result Value Ref Range  ? Opiates NONE DETECTED NONE DETECTED  ? Cocaine NONE DETECTED NONE DETECTED  ? Benzodiazepines NONE DETECTED NONE DETECTED  ? Amphetamines NONE DETECTED NONE DETECTED  ? Tetrahydrocannabinol NONE DETECTED NONE DETECTED  ? Barbiturates NONE DETECTED NONE DETECTED  ?  Comment: (NOTE) ?DRUG SCREEN FOR MEDICAL PURPOSES ?ONLY.  IF  CONFIRMATION IS NEEDED ?FOR ANY PURPOSE, NOTIFY LAB ?WITHIN 5 DAYS. ? ?LOWEST DETECTABLE LIMITS ?FOR URINE DRUG SCREEN ?Drug Class                     Cutoff (ng/mL) ?Amphetamine and metabolites    1000 ?Barbiturate and metabolites    200 ?Benzodiazepine                 200 ?Tricyclics and metabolites     300 ?Opiates and metabolites        300 ?Cocaine and metabolites        300 ?THC                            50 ?Performed at [facility], [place] [address] [place], [place] ?[address] ?  ?Protime-INR     Status: None  ? Collection Time: 06/16/21  3:40 PM  ?Result Value Ref Range  ? Prothrombin Time 14.0 11.4 - 15.2 seconds  ? INR 1.1 0.8 - 1.2  ?  Comment: (NOTE) ?INR goal varies based on device and disease states. ?Performed at [facility], [place] [address], [place], [place] ?[address] ?  ?APTT     Status: None  ? Collection Time: 06/16/21  3:40 PM  ?Result Value Ref Range  ? aPTT 26 24 - 36 seconds  ?  Comment: Performed at [facility], [place] [address] [place], [place] [address]  ?CBC     Status: None  ? Collection Time: 06/16/21  3:40 PM  ?Result Value Ref Range  ? WBC 10.5 4.0 - 10.5 K/uL  ? RBC 4.58 3.87 - 5.11 MIL/uL  ? Hemoglobin 13.1 12.0 - 15.0 g/dL  ? HCT 40.6 36.0 - 46.0 %  ? MCV 88.6 80.0 - 100.0 fL  ? MCH 28.6 26.0 - 34.0 pg  ? MCHC 32.3 30.0 - 36.0 g/dL  ? RDW 13.6 11.5 - 15.5 %  ? Platelets 250 150 - 400 K/uL  ? nRBC 0.0 0.0 - 0.2 %  ?  Comment: Performed at [facility], [place] [address], [place],  [address]  ?Differential     Status: None  ? Collection Time: 06/16/21  3:40 PM  ?Result Value Ref Range  ? Neutrophils Relative % 70 %  ? Neutro Abs 7.4 1.7 - 7.7 K/uL  ? Lymphocytes Relative 21 %  ? Lymphs Abs 2.2 0.7 - 4.0 K/uL  ? Monocytes Relative 8 %  ? Monocytes Absolute 0.8 0.1 - 1.0 K/uL  ? Eosinophils Relative 1 %  ? Eosinophils Absolute 0.1 0.0 - 0.5 K/uL  ? Basophils Relative 0 %  ? Basophils Absolute 0.0 0.0 - 0.1 K/uL  ? Immature Granulocytes 0 %  ? Abs Immature  Granulocytes 0.02 0.00 - 0.07 K/uL  ?  Comment: Performed at [facility], [place] [address] [place], [place] [address]  ?Comprehensive metabolic panel     Status: Abnormal  ? Collection Time: 06/16/21  3:40 PM  ?Result Value Ref Range  ? Sodium 140 135 - 145 mmol/L  ? Potassium 3.9 3.5 - 5.1 mmol/L  ? Chloride 109 98 - 111 mmol/L  ? CO2 22 22 - 32 mmol/L  ? Glucose, Bld 120 (H) 70 - 99 mg/dL  ?  Comment: Glucose reference range applies only to samples taken after fasting for at least 8 hours.  ? BUN 22 8 - 23 mg/dL  ? Creatinine, Ser 1.61 (H) 0.44 - 1.00 mg/dL  ? Calcium 9.1 8.9 - 10.3 mg/dL  ? Total Protein 7.7 6.5 - 8.1 g/dL  ? Albumin 4.0 3.5 - 5.0 g/dL  ? AST 29 15 - 41 U/L  ? ALT 9 0 - 44 U/L  ? Alkaline Phosphatase 75 38 - 126 U/L  ? Total Bilirubin 1.7 (H) 0.3 - 1.2 mg/dL  ? GFR, Estimated 30 (L) >60 mL/min  ?  Comment: (NOTE) ?Calculated using the CKD-EPI Creatinine Equation (2021) ?  ? Anion gap 9 5 - 15  ?  Comment: Performed at [facility], [place] [address], [place], [place] [address]  ?I-stat chem 8, ED     Status: Abnormal  ? Collection Time: 06/16/21  3:52 PM  ?Result Value Ref Range  ? Sodium 143 135 - 145 mmol/L  ? Potassium 3.7 3.5 - 5.1 mmol/L  ? Chloride 106 98 - 111 mmol/L  ? BUN 28 (H) 8 - 23 mg/dL  ? Creatinine, Ser 1.50 (H) 0.44 - 1.00 mg/dL  ? Glucose, Bld 124 (H) 70 - 99 mg/dL  ?  Comment: Glucose reference range applies only to samples taken after fasting for at least 8 hours.  ? Calcium, Ion 1.12 (L) 1.15 - 1.40 mmol/L  ? TCO2 27 22 - 32 mmol/L  ? Hemoglobin 13.6 12.0 - 15.0 g/dL  ? HCT 40.0 36.0 - 46.0 %  ? ?CT HEAD WO CONTRAST ? ?Result Date: 06/16/2021 ?CLINICAL DATA:  Neurological deficit EXAM: CT HEAD WITHOUT CONTRAST TECHNIQUE: Contiguous axial images were obtained from the base of the skull through the vertex without intravenous contrast. RADIATION DOSE REDUCTION: This exam was performed according to the departmental dose-optimization program which includes automated  exposure control, adjustment of the mA and/or kV according to patient size and/or use of iterative reconstruction technique. COMPARISON:  06/13/2021 FINDINGS: Brain: No acute intracranial findings are seen. There are no si

## 2021-06-16 NOTE — ED Provider Notes (Signed)
?[place] [facility] ?Provider Note ? ? ?CSN: [id number] ?Arrival date & time: 06/16/21  1152 ? ?  ? ?History ? ?Chief Complaint  ?Patient presents with  ? Weakness  ? ? ?Patricia English is a 88 y.o. female. ? ?Pt is a 88 yo female with a pmhx significant for CAD, DM2, HTN, paroxysmal afib not on thinners (due to GI bleeds), and hx GI bleed.  Pt fell on Sunday, 4/30.  She hit her head and was seen in the ED.  She had a CT then which was nl. Pt lives by herself and woke up with left sided weakness and facial droop.  The pt said she could not get out of bed.  She denies any vision changes or speech problems.  LSN last night at 2100.  ? ? ?  ? ?Home Medications ?Prior to Admission medications   ?Medication Sig Start Date End Date Taking? Authorizing Provider  ?Cholecalciferol 25 MCG (1000 UT) tablet Take 1,000 Units by mouth daily.    [provider]  ?Coenzyme Q10 100 MG TABS Take 100 mg by mouth daily.    [provider]  ?diltiazem (DILACOR XR) 180 MG 24 hr capsule Take 1 capsule (180 mg total) by mouth daily. 11/01/20   [name], MD  ?furosemide (LASIX) 40 MG tablet Take 1 tablet (40 mg total) by mouth every Monday, Wednesday, and Friday. 11/02/20   [name], MD  ?glipiZIDE (GLUCOTROL XL) 5 MG 24 hr tablet Take 5 mg by mouth every evening.     [provider]  ?isosorbide mononitrate (IMDUR) 30 MG 24 hr tablet Take 30 mg by mouth every evening.     [provider]  ?metFORMIN (GLUCOPHAGE) 500 MG tablet Take 250 mg by mouth 2 (two) times daily with a meal.    [provider]  ?metoprolol (LOPRESSOR) 50 MG tablet Take 25 mg by mouth 2 (two) times daily.     [provider]  ?ondansetron (ZOFRAN) 4 MG tablet Take 1 tablet (4 mg total) by mouth every 6 (six) hours. 01/20/21   Smoot, Sarah A, PA-C  ?pantoprazole (PROTONIX) 40 MG tablet Take 1 tablet (40 mg total) by mouth 2 (two) times daily. 11/01/20   [name], MD   ?pravastatin (PRAVACHOL) 40 MG tablet Take 40 mg by mouth daily. 06/06/17   [provider]  ?   ? ?Allergies    ?Lisinopril   ? ?Review of Systems   ?Review of Systems  ?Neurological:  Positive for weakness.  ?All other systems reviewed and are negative. ? ?Physical Exam ?Updated Vital Signs ?BP (!) 173/66   Pulse 81   Temp 97.6 ?F (36.4 ?C) (Oral)   Resp (!) 28   SpO2 100%  ?Physical Exam ?Vitals and nursing note reviewed.  ?Constitutional:   ?   Appearance: Normal appearance.  ?HENT:  ?   Head: Normocephalic.  ?   Comments: Bruising and swelling around left eye. ?Left facial droop. ?   Nose: Nose normal.  ?   Mouth/Throat:  ?   Mouth: Mucous membranes are moist.  ?   Pharynx: Oropharynx is clear.  ?Eyes:  ?   Extraocular Movements: Extraocular movements intact.  ?   Conjunctiva/sclera: Conjunctivae normal.  ?   Pupils: Pupils are equal, round, and reactive to light.  ?Cardiovascular:  ?   Rate and Rhythm: Normal rate. Rhythm irregular.  ?   Pulses: Normal pulses.  ?   Heart sounds: Normal heart sounds.  ?  Pulmonary:  ?   Effort: Pulmonary effort is normal.  ?   Breath sounds: Normal breath sounds.  ?Abdominal:  ?   General: Abdomen is flat. Bowel sounds are normal.  ?   Palpations: Abdomen is soft.  ?Musculoskeletal:     ?   General: Normal range of motion.  ?   Cervical back: Normal range of motion and neck supple.  ?Skin: ?   General: Skin is warm.  ?   Capillary Refill: Capillary refill takes less than 2 seconds.  ?Neurological:  ?   General: No focal deficit present.  ?   Mental Status: She is alert.  ?   Comments: Left arm and leg weakness (mild)  ?Psychiatric:     ?   Mood and Affect: Mood normal.     ?   Behavior: Behavior normal.  ? ? ?ED Results / Procedures / Treatments   ?Labs ?(all labs ordered are listed, but only abnormal results are displayed) ?Labs Reviewed  ?URINALYSIS, ROUTINE W REFLEX MICROSCOPIC - Abnormal; Notable for the following components:  ?    Result Value  ? Color, Urine  STRAW (*)   ? Hgb urine dipstick SMALL (*)   ? Nitrite POSITIVE (*)   ? Bacteria, UA FEW (*)   ? All other components within normal limits  ?I-STAT CHEM 8, ED - Abnormal; Notable for the following components:  ? BUN 28 (*)   ? Creatinine, Ser 1.50 (*)   ? Glucose, Bld 124 (*)   ? Calcium, Ion 1.12 (*)   ? All other components within normal limits  ?PROTIME-INR  ?APTT  ?CBC  ?DIFFERENTIAL  ?RAPID URINE DRUG SCREEN, HOSP PERFORMED  ?ETHANOL  ?COMPREHENSIVE METABOLIC PANEL  ? ? ?EKG ?EKG Interpretation ? ?Date/Time:  Wednesday Jun 16 2021 12:13:11 EDT ?Ventricular Rate:  84 ?PR Interval:    ?QRS Duration: 87 ?QT Interval:  385 ?QTC Calculation: 458 ?R Axis:   -38 ?Text Interpretation: Atrial fibrillation Abnormal R-wave progression, late transition Inferior infarct, age indeterminate No significant change since last tracing Confirmed by Isla Pence [id number]) on 06/16/2021 12:19:22 PM ? ?Radiology ?CT HEAD WO CONTRAST ? ?Result Date: 06/16/2021 ?CLINICAL DATA:  Neurological deficit EXAM: CT HEAD WITHOUT CONTRAST TECHNIQUE: Contiguous axial images were obtained from the base of the skull through the vertex without intravenous contrast. RADIATION DOSE REDUCTION: This exam was performed according to the departmental dose-optimization program which includes automated exposure control, adjustment of the mA and/or kV according to patient size and/or use of iterative reconstruction technique. COMPARISON:  06/13/2021 FINDINGS: Brain: No acute intracranial findings are seen. There are no signs of bleeding within the cranium. Cortical sulci are prominent. There is decreased density in the periventricular and subcortical white matter. Vascular: Scattered arterial calcifications are seen. Skull: No fracture is seen in the calvarium. Hyperostosis frontalis interna is seen. There is interval decrease in left frontal scalp hematoma. Sinuses/Orbits: There is mild mucosal thickening in the ethmoid sinus. There is mucous retention cyst in  the right maxillary sinus. There is mucosal thickening and possibly small air-fluid level in the right side sphenoid sinus. Other: None IMPRESSION: No acute intracranial findings are seen.  Atrophy. Chronic sinusitis. Electronically Signed   By: Elmer Picker M.D.   On: 06/16/2021 12:53   ? ?Procedures ?Aspiration of blood/fluid ? ?Date/Time: 06/16/2021 4:35 PM ?Performed by: Isla Pence, MD ?Authorized by: Isla Pence, MD  ?Consent: Verbal consent obtained. ?Time out: Immediately prior to procedure a "time out" was called to verify  the correct patient, procedure, equipment, support staff and site/side marked as required. ?Preparation: Patient was prepped and draped in the usual sterile fashion. ?Local anesthesia used: no ? ?Anesthesia: ?Local anesthesia used: no ? ?Sedation: ?Patient sedated: no ? ?Patient tolerance: patient tolerated the procedure well with no immediate complications ?Comments: Right femoral vein stick done as the nurses were unable to obtain blood. ? ?  ? ? ?Medications Ordered in ED ?Medications  ?LORazepam (ATIVAN) injection 0.5 mg (has no administration in time range)  ? ? ?ED Course/ Medical Decision Making/ A&P ?  ?                        ?Medical Decision Making ?Amount and/or Complexity of Data Reviewed ?Labs: ordered. ?Radiology: ordered. ? ?Risk ?Prescription drug management. ? ? ?This patient presents to the ED for concern of weakness + facial droop, this involves an extensive number of treatment options, and is a complaint that carries with it a high risk of complications and morbidity.  The differential diagnosis includes cva, tia, head bleed, electrolyte abn ? ? ?Co morbidities that complicate the patient evaluation ? ?CAD, DM2, HTN, paroxysmal afib not on thinners (due to GI bleeds), and hx GI bleed ? ? ?Additional history obtained: ? ?Additional history obtained from epic chart review ?External records from outside source obtained and reviewed including granddaughter,  EMS report ? ? ?Lab Tests: ? ?I Ordered, and personally interpreted labs.  The pertinent results include:  cbc nl; istat with cr 1.5; inr 1.1; ua neg ? ? ?Imaging Studies ordered: ? ?I ordered imaging studies including

## 2021-06-16 NOTE — ED Notes (Signed)
Patient transported to CT 

## 2021-06-17 ENCOUNTER — Encounter (HOSPITAL_COMMUNITY): Payer: Self-pay | Admitting: Cardiovascular Disease

## 2021-06-17 ENCOUNTER — Other Ambulatory Visit (HOSPITAL_COMMUNITY): Payer: Self-pay

## 2021-06-17 ENCOUNTER — Inpatient Hospital Stay (HOSPITAL_COMMUNITY): Payer: Medicare Other

## 2021-06-17 DIAGNOSIS — I6523 Occlusion and stenosis of bilateral carotid arteries: Secondary | ICD-10-CM

## 2021-06-17 DIAGNOSIS — I639 Cerebral infarction, unspecified: Secondary | ICD-10-CM | POA: Diagnosis not present

## 2021-06-17 DIAGNOSIS — I10 Essential (primary) hypertension: Secondary | ICD-10-CM

## 2021-06-17 DIAGNOSIS — W19XXXS Unspecified fall, sequela: Secondary | ICD-10-CM

## 2021-06-17 LAB — CBC
HCT: 40.2 % (ref 36.0–46.0)
Hemoglobin: 13.3 g/dL (ref 12.0–15.0)
MCH: 29.3 pg (ref 26.0–34.0)
MCHC: 33.1 g/dL (ref 30.0–36.0)
MCV: 88.5 fL (ref 80.0–100.0)
Platelets: 234 10*3/uL (ref 150–400)
RBC: 4.54 MIL/uL (ref 3.87–5.11)
RDW: 13.5 % (ref 11.5–15.5)
WBC: 10.6 10*3/uL — ABNORMAL HIGH (ref 4.0–10.5)
nRBC: 0 % (ref 0.0–0.2)

## 2021-06-17 LAB — LIPID PANEL
Cholesterol: 155 mg/dL (ref 0–200)
HDL: 48 mg/dL (ref >40)
LDL Cholesterol: 79 mg/dL (ref 0–99)
Total CHOL/HDL Ratio: 3.2 RATIO
Triglycerides: 138 mg/dL (ref <150)
VLDL: 28 mg/dL (ref 0–40)

## 2021-06-17 LAB — HEMOGLOBIN A1C
Hgb A1c MFr Bld: 6.3 % — ABNORMAL HIGH (ref 4.8–5.6)
Mean Plasma Glucose: 134.11 mg/dL

## 2021-06-17 LAB — CREATININE, SERUM
Creatinine, Ser: 1.51 mg/dL — ABNORMAL HIGH (ref 0.44–1.00)
GFR, Estimated: 32 mL/min — ABNORMAL LOW (ref >60)

## 2021-06-17 LAB — GLUCOSE, CAPILLARY: Glucose-Capillary: 118 mg/dL — ABNORMAL HIGH (ref 70–99)

## 2021-06-17 IMAGING — MR MR MRA HEAD W/O CM
2 series · 7 of 48 positions shown · non-contrast
Comparison: MRI yesterday.

CLINICAL DATA: Stroke, follow-up. Right brain strokes by previous
MR.

EXAM:
MRA HEAD WITHOUT CONTRAST
TECHNIQUE: Angiographic images of the Circle of Willis were acquired using MRA
technique without intravenous contrast.

[Series 2: FLAIR · sagittal · 5.0mm · 0.23mm/px · 4 of 23 slices shown]
[im 1/23]
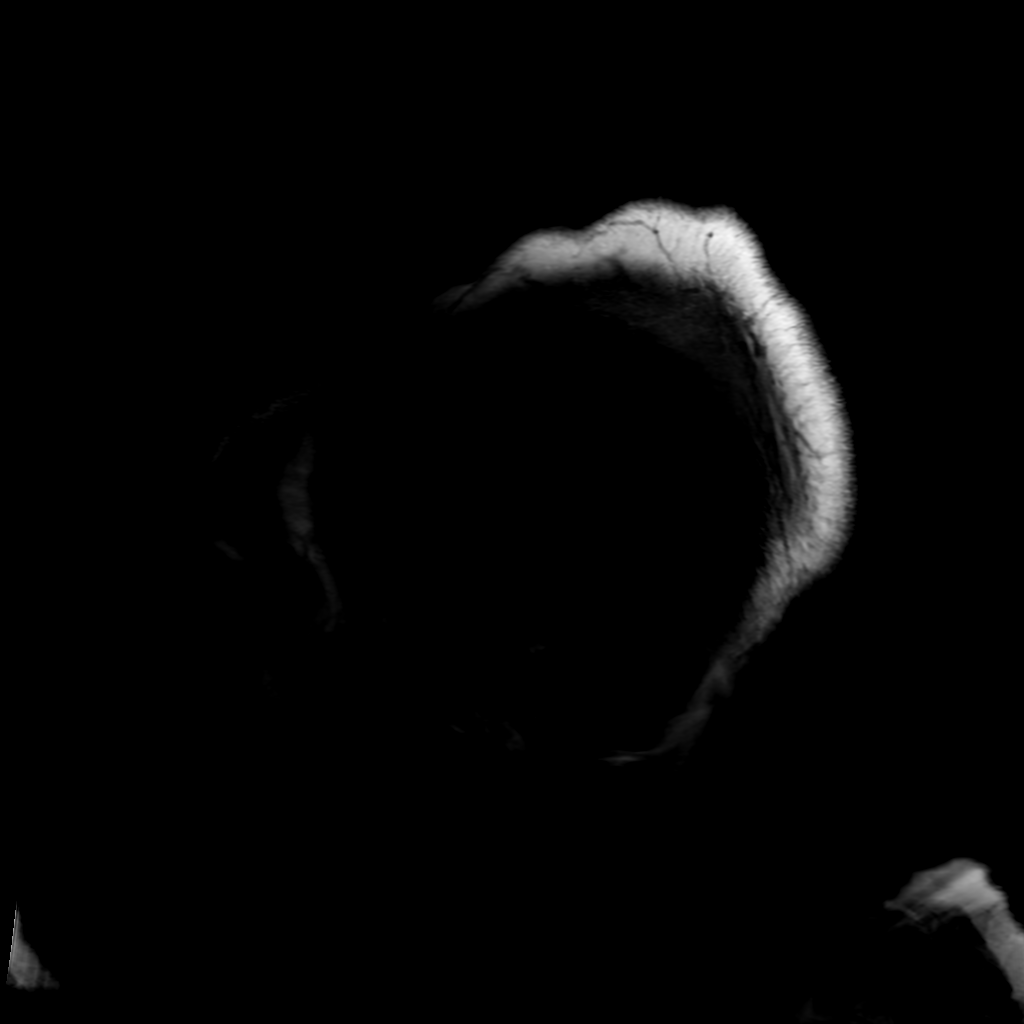
[im 6/23]
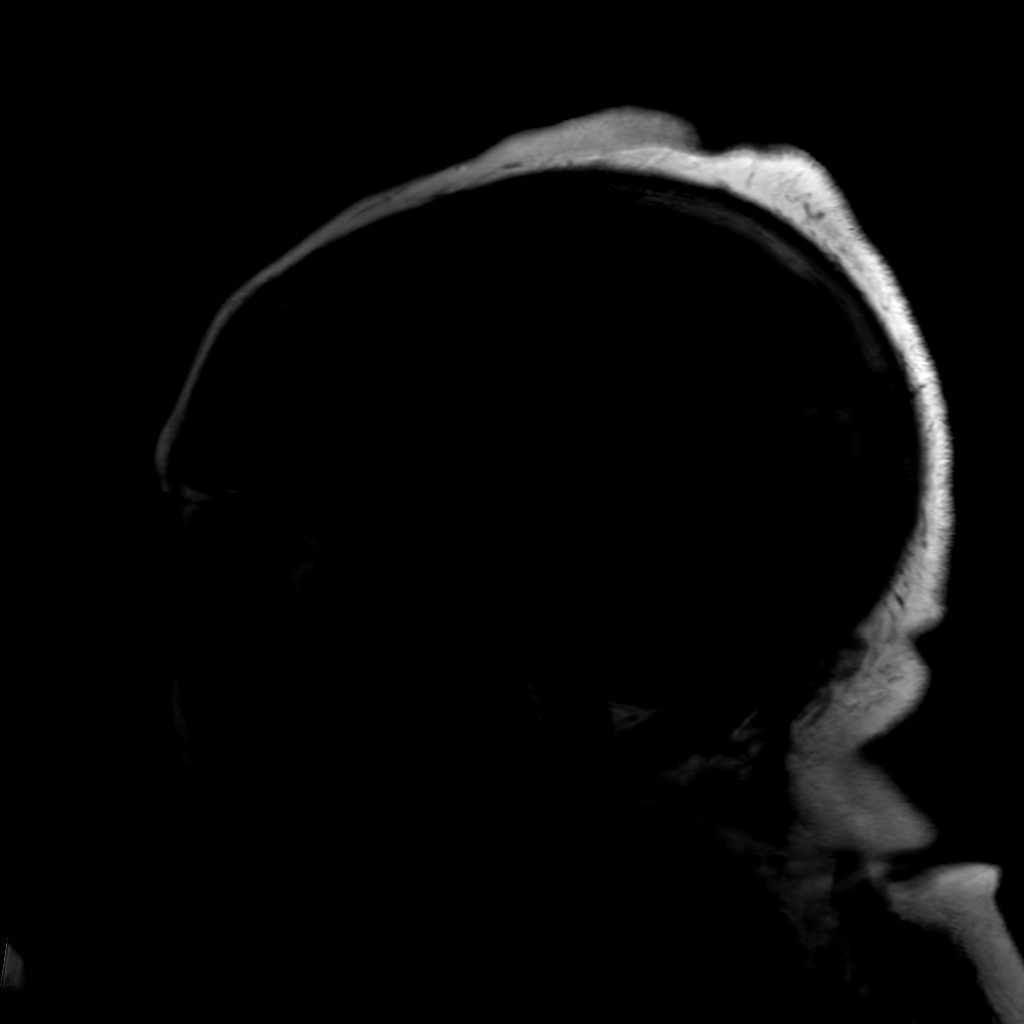
[im 12/23]
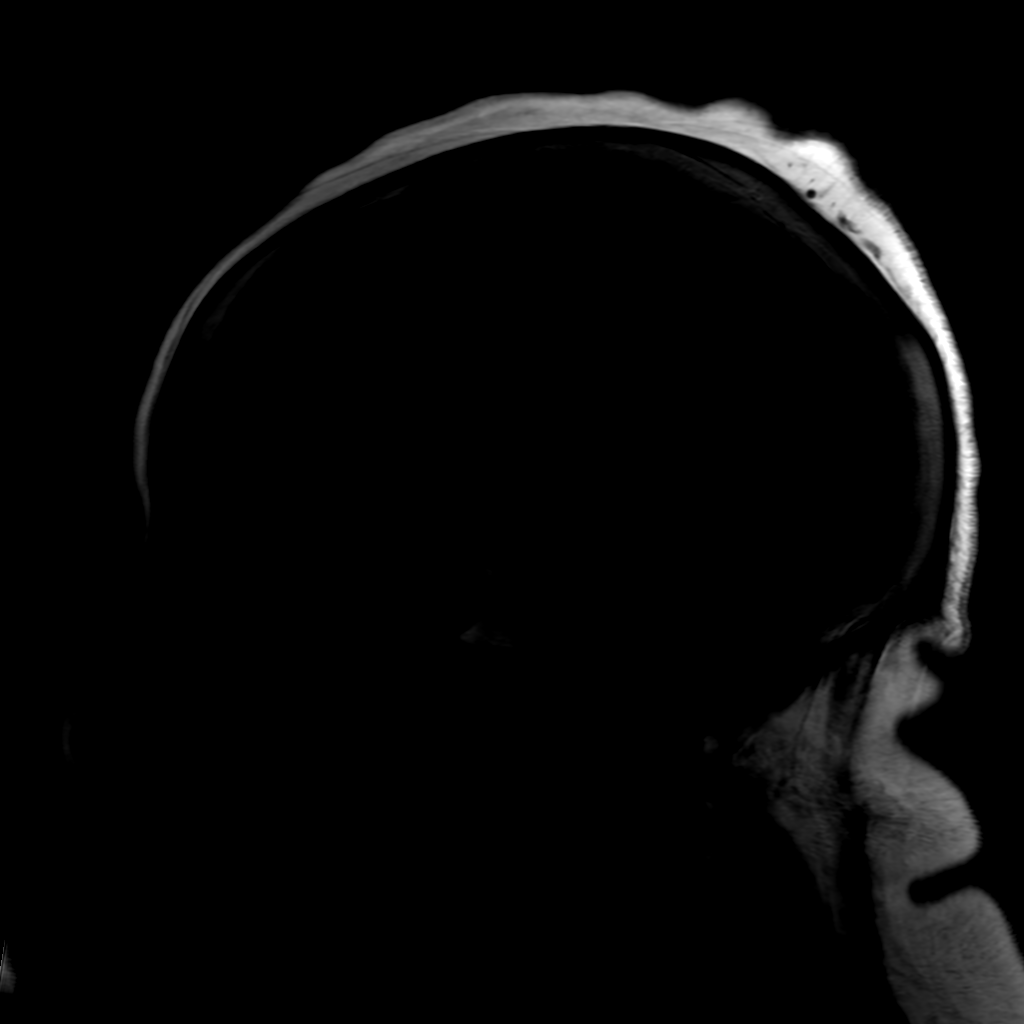
[im 23/23]
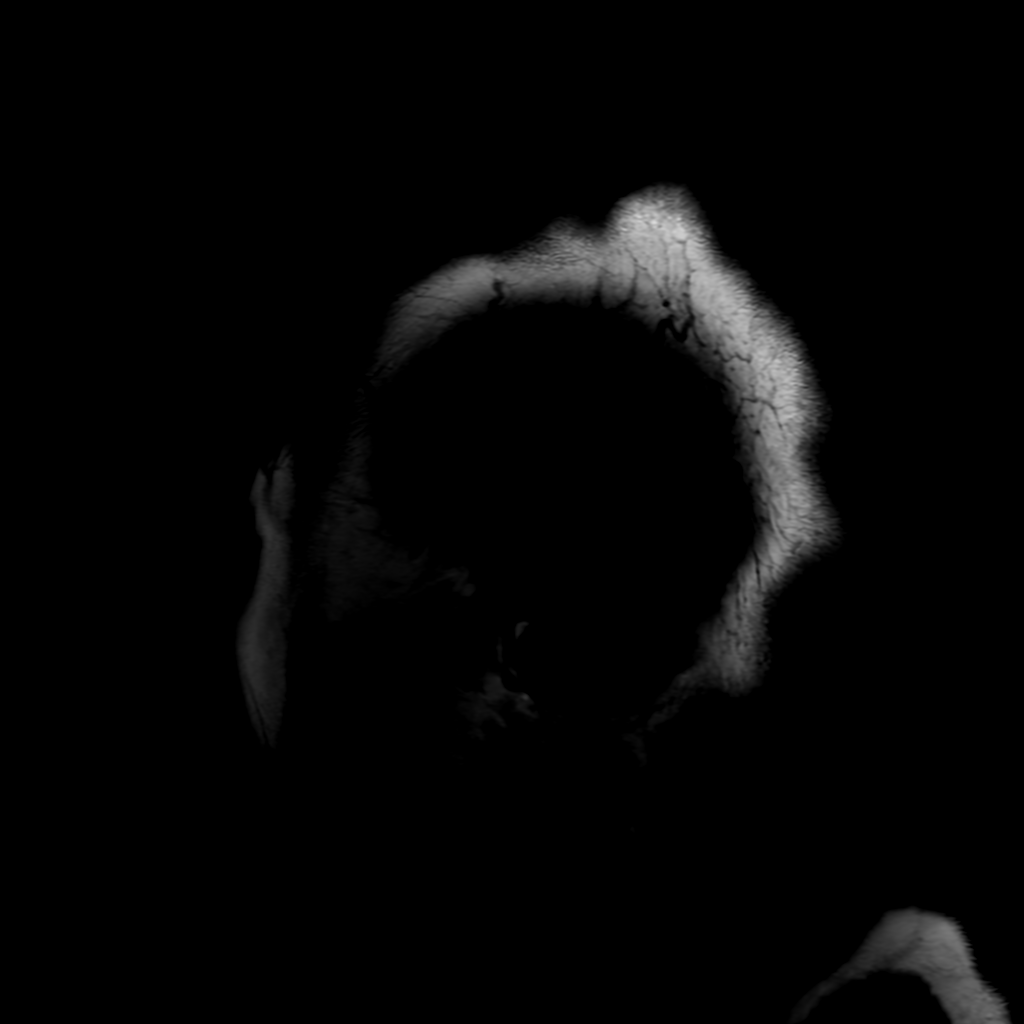

[Series 3: ax (id) · axial · 1.0mm · 0.43mm/px · z∈[-75,-11]mm · 3 of 193 slices shown]
[im 33/193]
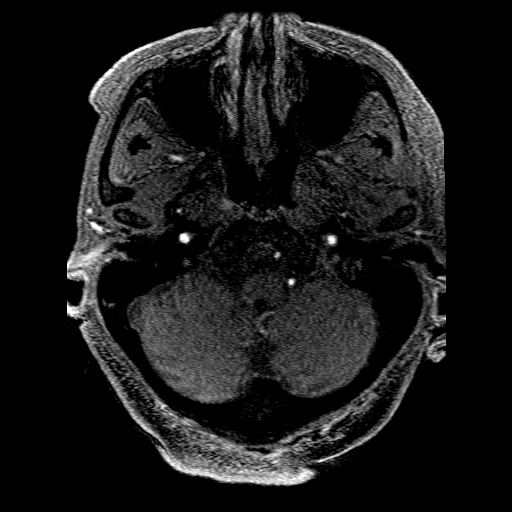
[im 97/193]
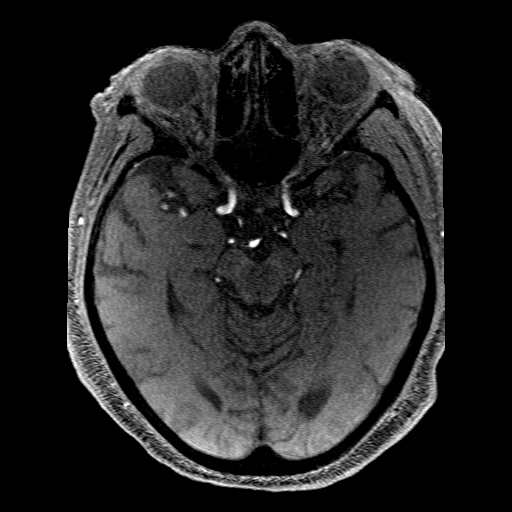
[im 161/193]
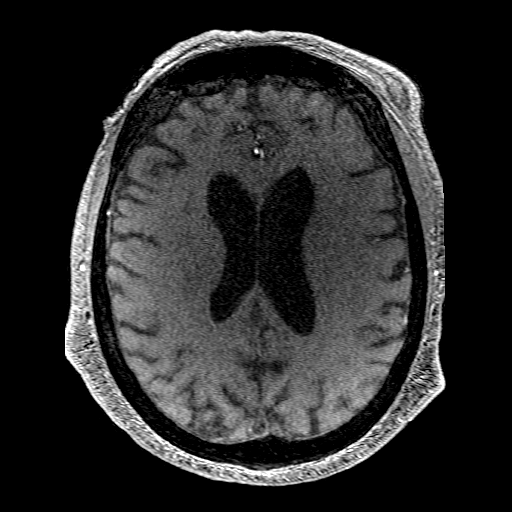

[7 of 48 positions shown; findings below may reference images not displayed]

FINDINGS: Anterior circulation: Both internal carotid arteries are patent
through the skull base and siphon regions. The left anterior and
middle cerebral arteries are normal. The right anterior and middle
cerebral vessel arteries are patent. The right MCA is tortuous in
there is signal loss related to caudal directed flow. I do not
believe this represents a true large vessel lesion. More distal
branch vessels show flow, though there is a missing M2 branch in the
inferior division.

Posterior circulation: Normal

Anatomic variants: None other significant.

Other: None.
IMPRESSION: I believe there is artifactual signal loss in the right MCA
bifurcation region because the vessel turns caudally. I do not think
there is an M1 occlusion. More distal branch vessels show flow,
which would not be the case if there were an M1 occlusion. There
does appear to be a missing M2 branch, probably within the inferior
division.

## 2021-06-17 MED ORDER — ASPIRIN EC 81 MG PO TBEC
81.0000 mg | DELAYED_RELEASE_TABLET | Freq: Every day | ORAL | Status: DC
  Administered 2021-06-17 – 2021-06-18 (×2): 81 mg via ORAL
  Filled 2021-06-17 (×2): qty 1

## 2021-06-17 MED ORDER — APIXABAN 5 MG PO TABS
5.0000 mg | ORAL_TABLET | Freq: Two times a day (BID) | ORAL | Status: DC
Start: 2021-06-17 — End: 2021-06-20
  Administered 2021-06-17 – 2021-06-19 (×6): 5 mg via ORAL
  Filled 2021-06-17 (×6): qty 1

## 2021-06-17 MED ORDER — INSULIN ASPART 100 UNIT/ML IJ SOLN
0.0000 [IU] | Freq: Three times a day (TID) | INTRAMUSCULAR | Status: DC
  Administered 2021-06-18: 2 [IU] via SUBCUTANEOUS
  Administered 2021-06-18: 5 [IU] via SUBCUTANEOUS
  Administered 2021-06-19 (×3): 3 [IU] via SUBCUTANEOUS
  Administered 2021-06-20: 2 [IU] via SUBCUTANEOUS
  Administered 2021-06-20: 3 [IU] via SUBCUTANEOUS

## 2021-06-17 MED ORDER — PRAVASTATIN SODIUM 40 MG PO TABS
80.0000 mg | ORAL_TABLET | Freq: Every day | ORAL | Status: DC
  Administered 2021-06-17 – 2021-06-21 (×5): 80 mg via ORAL
  Filled 2021-06-17 (×5): qty 2

## 2021-06-17 NOTE — Progress Notes (Signed)
Carotid duplex has been completed.  ? ?Preliminary results in CV Proc.  ? ?Myra Weng [place] Fant ?06/17/2021 2:23 PM    ?

## 2021-06-17 NOTE — Progress Notes (Signed)
ANTICOAGULATION CONSULT NOTE - Initial Consult ? ?Pharmacy Consult for Eliquis ?Indication: atrial fibrillation and stroke ? ?Allergies  ?Allergen Reactions  ? Lisinopril   ?  This caused laryngeal edema  ? ? ?Patient Measurements: ?Height: '5\' 7"'$  (170.2 cm) ?Weight: 76.7 kg (169 lb) ?IBW/kg (Calculated): 61.6 kg  ? ?Vital Signs: ?BP: 114/58 (05/04 1145) ?Pulse Rate: 102 (05/04 1145) ? ?Labs: ?Recent Labs  ?  06/16/21 ?1540 06/16/21 ?1552 06/17/21 ?3244  ?HGB 13.1 13.6 13.3  ?HCT 40.6 40.0 40.2  ?PLT 250  --  234  ?APTT 26  --   --   ?LABPROT 14.0  --   --   ?INR 1.1  --   --   ?CREATININE 1.61* 1.50* 1.51*  ? ? ?Estimated Creatinine Clearance: 25.9 mL/min (A) (by C-G formula based on SCr of 1.51 mg/dL (H)). ? ? ?Medical History: ?Past Medical History:  ?Diagnosis Date  ? Coronary artery disease   ? Diabetes mellitus without complication ([place])   ? GI bleeding 11/2017  ? Hypertension   ? Internal hemorrhoids   ? ? ?Medications:  ?Scheduled:  ?  stroke: early stages of recovery book   Does not apply Once  ? aspirin EC  81 mg Oral Daily  ? heparin  5,000 Units Subcutaneous Q8H  ? insulin aspart  0-15 Units Subcutaneous TID WC  ? pravastatin  80 mg Oral Daily  ? ? ?Assessment: ?88 yo F presenting with stroke, history of atrial fibrillation (not on anticoagulation) and recent GI bleed (10/2020). Per chart review, patient was previously on Warfarin for anticoagulation; Warfarin was discontinued in 2014 d/t GI bleed. Baseline CBC is ok. Pharmacy consulted for Eliquis dosing.  ? ?Goal of Therapy:  ?Monitor platelets by anticoagulation protocol: Yes ?  ?Plan:  ?Start Eliquis 5 mg PO BID ?Monitor CBC and for signs/symptoms of bleeding  ? ? ?Vance Peper, PharmD ?PGY1 Pharmacy Resident ?06/17/2021 2:12 PM  ? ?Please check AMION for all [facility] phone numbers ?After 10:00 PM, call [place] [phone] ? ? ? ?

## 2021-06-17 NOTE — Evaluation (Signed)
Physical Therapy Evaluation ?Patient Details ?Name: Patricia English ?MRN: [id number] ?DOB: [birth year] ?Today's Date: 06/17/2021 ? ?History of Present Illness ? 88 y.o. F admitted on 06/16/21 due to L sided facial droop and weakness. 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 PMH significant for DM2, Afib, CAD, HTN, GI bleeds.  ?Clinical Impression ? Pt admitted secondary to problem above with deficits below. Pt requiring min A for bed mobility and min A +2 for transfers and gait this session. Notable weakness in LLE and unsteadiness throughout. Pt was previously very independent and able to care for herself. Recommending AIR level therapies to increase independence and safety with functional mobility tasks. Will continue to follow acutely.  ?   ? ?Recommendations for follow up therapy are one component of a multi-disciplinary discharge planning process, led by the attending physician.  Recommendations may be updated based on patient status, additional functional criteria and insurance authorization. ? ?Follow Up Recommendations Acute inpatient rehab (3hours/day) ? ?  ?Assistance Recommended at Discharge Frequent or constant Supervision/Assistance  ?Patient can return home with the following ? A lot of help with walking and/or transfers;A little help with bathing/dressing/bathroom;Assistance with cooking/housework;Assist for transportation;Help with stairs or ramp for entrance ? ?  ?Equipment Recommendations Other (comment) (TBD pending progression)  ?Recommendations for Other Services ?    ?  ?Functional Status Assessment Patient has had a recent decline in their functional status and demonstrates the ability to make significant improvements in function in a reasonable and predictable amount of time.  ? ?  ?Precautions / Restrictions Precautions ?Precautions: Fall ?Restrictions ?Weight Bearing Restrictions: No  ? ?  ? ?Mobility ? Bed Mobility ?Overal bed mobility: Needs  Assistance ?Bed Mobility: Supine to Sit, Sit to Supine ?  ?  ?Supine to sit: Min assist ?Sit to supine: Min guard ?  ?General bed mobility comments: Min A to assist with lifting trunk to sitting, pt able to return to supine with no assist. Multimodal cues to scoot forward at EOB ?  ? ?Transfers ?Overall transfer level: Needs assistance ?Equipment used: 2 person hand held assist ?Transfers: Sit to/from Stand ?Sit to Stand: Min assist, +2 safety/equipment, +2 physical assistance ?  ?  ?  ?  ?  ?General transfer comment: Min A +2 for steadying to stand. Pt with mild posterior lean upon standing. ?  ? ?Ambulation/Gait ?Ambulation/Gait assistance: Min assist, +2 physical assistance ?Gait Distance (Feet): 40 Feet ?Assistive device: 2 person hand held assist ?Gait Pattern/deviations: Step-through pattern, Decreased stride length, Narrow base of support ?Gait velocity: Decreased ?  ?  ?General Gait Details: Slow, unsteady gait. Narrow BOS throughout. Min A +2 for steadying. ? ?Stairs ?  ?  ?  ?  ?  ? ?Wheelchair Mobility ?  ? ?Modified Rankin (Stroke Patients Only) ?  ? ?  ? ?Balance Overall balance assessment: Needs assistance ?Sitting-balance support: Single extremity supported, Feet unsupported ?Sitting balance-Leahy Scale: Fair ?Sitting balance - Comments: Mild posterior lean due to weakness ?Postural control: Posterior lean ?Standing balance support: Bilateral upper extremity supported ?Standing balance-Leahy Scale: Poor ?Standing balance comment: Pt with posterior lean upon initial standing, requiring cuing and assist to lean forward. ?  ?  ?  ?  ?  ?  ?  ?  ?  ?  ?  ?   ? ? ? ?Pertinent Vitals/Pain Pain Assessment ?Pain Assessment: No/denies pain  ? ? ?Home Living Family/patient expects to be discharged to:: Private residence ?Living Arrangements:  Children ?Available Help at Discharge: Family;Available PRN/intermittently ?Type of Home: House ?Home Access: Stairs to enter ?Entrance Stairs-Rails: Right ?Entrance  Stairs-Number of Steps: 3-4 ?  ?Home Layout: One level ?Home Equipment: None ?   ?  ?Prior Function Prior Level of Function : Independent/Modified Independent ?  ?  ?  ?  ?  ?  ?  ?  ?  ? ? ?Hand Dominance  ? Dominant Hand: Right ? ?  ?Extremity/Trunk Assessment  ? Upper Extremity Assessment ?Upper Extremity Assessment: Defer to OT evaluation ?  ? ?Lower Extremity Assessment ?Lower Extremity Assessment: LLE deficits/detail ?LLE Deficits / Details: LLE grossly 3+/5 throughout ?  ? ?Cervical / Trunk Assessment ?Cervical / Trunk Assessment: Kyphotic  ?Communication  ? Communication: No difficulties  ?Cognition Arousal/Alertness: Awake/alert ?Behavior During Therapy: [facility] for tasks assessed/performed ?Overall Cognitive Status: Within Functional Limits for tasks assessed ?  ?  ?  ?  ?  ?  ?  ?  ?  ?  ?  ?  ?  ?  ?  ?  ?  ?  ?  ? ?  ?General Comments General comments (skin integrity, edema, etc.): VSS ? ?  ?Exercises    ? ?Assessment/Plan  ?  ?PT Assessment Patient needs continued PT services  ?PT Problem List Decreased strength;Decreased balance;Decreased mobility;Decreased safety awareness;Decreased knowledge of precautions;Decreased knowledge of use of DME ? ?   ?  ?PT Treatment Interventions Functional mobility training;DME instruction;Stair training;Gait training;Therapeutic activities;Therapeutic exercise;Balance training;Patient/family education   ? ?PT Goals (Current goals can be found in the Care Plan section)  ?Acute Rehab PT Goals ?Patient Stated Goal: to be independent ?PT Goal Formulation: With patient/family ?Time For Goal Achievement: 07/01/21 ?Potential to Achieve Goals: Good ? ?  ?Frequency Min 4X/week ?  ? ? ?Co-evaluation   ?  ?  ?  ?  ? ? ?  ?AM-PAC PT "6 Clicks" Mobility  ?Outcome Measure Help needed turning from your back to your side while in a flat bed without using bedrails?: A Little ?Help needed moving from lying on your back to sitting on the side of a flat bed without using bedrails?: A  Little ?Help needed moving to and from a bed to a chair (including a wheelchair)?: A Lot ?Help needed standing up from a chair using your arms (e.g., wheelchair or bedside chair)?: A Lot ?Help needed to walk in hospital room?: A Lot ?Help needed climbing 3-5 steps with a railing? : Total ?6 Click Score: 13 ? ?  ?End of Session Equipment Utilized During Treatment: Gait belt ?Activity Tolerance: Patient tolerated treatment well ?Patient left: in bed;with call bell/phone within reach;with family/visitor present (on stretcher in ED) ?Nurse Communication: Mobility status ?PT Visit Diagnosis: Unsteadiness on feet (R26.81);Muscle weakness (generalized) (M62.81) ?  ? ?Time: 2542-7062 ?PT Time Calculation (min) (ACUTE ONLY): 20 min ? ? ?Charges:   PT Evaluation ?$PT Eval Moderate Complexity: 1 Mod ?  ?  ?   ? ? ?Reuel Derby, PT, DPT  ?Acute Rehabilitation Services  ?Pager: [phone] ?Office: [phone] ? ? ?[place] ?06/17/2021, 3:29 PM ? ?

## 2021-06-17 NOTE — Progress Notes (Signed)
SLP Cancellation Note ? ?Patient Details ?Name: Patricia English ?MRN: [id number] ?DOB: [birth year] ? ? ?Cancelled treatment:       Reason Eval/Treat Not Completed: Patient at procedure or test/unavailable; pt off the floor for a procedure; ST will continue efforts as able. ? ? ?Elvina Sidle, M.S., CCC-SLP ?06/17/2021, 3:38 PM ?

## 2021-06-17 NOTE — ED Notes (Signed)
Patient transported to vascular. 

## 2021-06-17 NOTE — TOC Benefit Eligibility Note (Signed)
Patient Advocate Encounter  Insurance verification completed.    The patient is currently admitted and upon discharge could be taking Eliquis 5 mg.  The current 30 day co-pay is, $47.00.   The patient is insured through [employer] Medicare Part D    Tatiyanna Lashley, CPhT Pharmacy Patient Advocate Specialist [facility] Patient Advocate Team Direct Number: [phone]  Fax: [phone]        

## 2021-06-17 NOTE — Progress Notes (Signed)
?  Echocardiogram ?2D Echocardiogram has been performed. ? ?Joette Catching ?06/17/2021, 11:58 AM ?

## 2021-06-17 NOTE — Progress Notes (Signed)
Ref: [name], MD ? ? ?Subjective:  ?Small decrease in left side of forehead and orbital hematoma.  ?Small improvement in speech. Passed swallow test. ? ?Objective:  ?Vital Signs in the last 24 hours: ?Temp:  [98.4 ?F (36.9 ?C)] 98.4 ?F (36.9 ?C) (05/04 1508) ?Pulse Rate:  [58-120] 62 (05/04 1508) ?Cardiac Rhythm: Atrial fibrillation;Bundle branch block (05/04 1507) ?Resp:  [13-26] 24 (05/04 1145) ?BP: (102-157)/(54-88) 157/77 (05/04 1508) ?SpO2:  [81 %-100 %] 96 % (05/04 1508) ?Weight:  [75.3 kg] 75.3 kg (05/04 1508) ? ?Physical Exam: ?BP Readings from Last 1 Encounters:  ?06/17/21 (!) 157/77  ?   ?Wt Readings from Last 1 Encounters:  ?06/17/21 75.3 kg  ?  Weight change:  Body mass index is 25.98 kg/m?. ?HEENT: [place]/AT, Eyes-Brown, Conjunctiva-Pink, Sclera-Non-icteric ?Neck: No JVD, No bruit, Trachea midline. ?Lungs:  Clear, Bilateral. ?Cardiac:  Irregular rhythm, normal S1 and S2, no S3. II/VI systolic murmur. ?Abdomen:  Soft, non-tender. BS present. ?Extremities:  No edema present. No cyanosis. No clubbing. ?CNS: AxOx3, Cranial nerves grossly intact, moves all 4 extremities.  ?Skin: Warm and dry. ? ? ?Intake/Output from previous day: ?No intake/output data recorded. ? ? ? ?Lab Results: ?BMET ?   ?Component Value Date/Time  ? NA 143 06/16/2021 1552  ? NA 140 06/16/2021 1540  ? NA 135 01/19/2021 1703  ? K 3.7 06/16/2021 1552  ? K 3.9 06/16/2021 1540  ? K 3.7 01/19/2021 1703  ? CL 106 06/16/2021 1552  ? CL 109 06/16/2021 1540  ? CL 101 01/19/2021 1703  ? CO2 22 06/16/2021 1540  ? CO2 21 (L) 01/19/2021 1703  ? CO2 20 (L) 10/31/2020 0437  ? GLUCOSE 124 (H) 06/16/2021 1552  ? GLUCOSE 120 (H) 06/16/2021 1540  ? GLUCOSE 76 01/19/2021 1703  ? BUN 28 (H) 06/16/2021 1552  ? BUN 22 06/16/2021 1540  ? BUN 22 01/19/2021 1703  ? CREATININE 1.51 (H) 06/17/2021 0349  ? CREATININE 1.50 (H) 06/16/2021 1552  ? CREATININE 1.61 (H) 06/16/2021 1540  ? CALCIUM 9.1 06/16/2021 1540  ? CALCIUM 8.8 (L) 01/19/2021 1703  ? CALCIUM 8.9  10/31/2020 0437  ? GFRNONAA 32 (L) 06/17/2021 0349  ? GFRNONAA 30 (L) 06/16/2021 1540  ? GFRNONAA 34 (L) 01/19/2021 1703  ? GFRAA 58 (L) 12/10/2017 0015  ? GFRAA >60 12/09/2017 0424  ? GFRAA 57 (L) 12/08/2017 0758  ? ?CBC ?   ?Component Value Date/Time  ? WBC 10.6 (H) 06/17/2021 0349  ? RBC 4.54 06/17/2021 0349  ? HGB 13.3 06/17/2021 0349  ? HCT 40.2 06/17/2021 0349  ? PLT 234 06/17/2021 0349  ? MCV 88.5 06/17/2021 0349  ? MCH 29.3 06/17/2021 0349  ? MCHC 33.1 06/17/2021 0349  ? RDW 13.5 06/17/2021 0349  ? LYMPHSABS 2.2 06/16/2021 1540  ? MONOABS 0.8 06/16/2021 1540  ? EOSABS 0.1 06/16/2021 1540  ? BASOSABS 0.0 06/16/2021 1540  ? ?HEPATIC Function Panel ?Recent Labs  ?  10/28/20 ?1816 01/19/21 ?1703 06/16/21 ?1540  ?PROT 7.0 8.1 7.7  ? ?HEMOGLOBIN A1C ?No components found for: HGA1C,  MPG ?CARDIAC ENZYMES ?Lab Results  ?Component Value Date  ? TROPONINI <0.30 11/22/2013  ? TROPONINI <0.30 10/13/2012  ? ?BNP ?No results for input(s): PROBNP in the last 8760 hours. ?TSH ?No results for input(s): TSH in the last 8760 hours. ?CHOLESTEROL ?Recent Labs  ?  06/17/21 ?5009  ?CHOL 155  ? ? ?Scheduled Meds: ?  stroke: early stages of recovery book   Does not apply Once  ?  apixaban  5 mg Oral BID  ? aspirin EC  81 mg Oral Daily  ? insulin aspart  0-15 Units Subcutaneous TID WC  ? pravastatin  80 mg Oral Daily  ? ?Continuous Infusions: ? sodium chloride Stopped (06/17/21 1448)  ? ?PRN Meds:.acetaminophen **OR** acetaminophen (TYLENOL) oral liquid 160 mg/5 mL **OR** acetaminophen, LORazepam, senna-docusate ? ?Assessment/Plan: ? Acute right basal ganglia, insula and parietal lobe infarct ?Chronic atrial fibrillation ?HTN ?Type 2 DM ?CAD ?H/O GI bleed ? ?Plan: ?Patient willing to take risk with oral anticoagulation, lower dose due to advanced age. ?Consider inpatient rehab. ?Appreciate neurology consultation. ? ? LOS: 1 day  ? ?Time spent including chart review, lab review, examination, discussion with patient/Nurse/Daughter : 30  min ? ? ?[name]  MD  ?06/17/2021, 7:35 PM ? ? ? ? ?

## 2021-06-17 NOTE — Progress Notes (Addendum)
STROKE TEAM PROGRESS NOTE  ? ?INTERVAL HISTORY ?Patient is seen in her room with her daughter at the bedside.  Yesterday, she awoke with left-sided weakness and was unable to get out of bed.  Of note, she had a fall on Sunday with LOC.  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 ? ?Vitals:  ? 06/17/21 0445 06/17/21 0700 06/17/21 0715 06/17/21 0915  ?BP: (!) 149/78 121/81 112/86 102/70  ?Pulse:  89 86 (!) 120  ?Resp: (!) 21 (!) '26 13 17  '$ ?Temp:      ?TempSrc:      ?SpO2:  95% 99% (!) 81%  ? ?CBC:  ?Recent Labs  ?Lab 06/16/21 ?1540 06/16/21 ?1552 06/17/21 ?9678  ?WBC 10.5  --  10.6*  ?NEUTROABS 7.4  --   --   ?HGB 13.1 13.6 13.3  ?HCT 40.6 40.0 40.2  ?MCV 88.6  --  88.5  ?PLT 250  --  234  ? ?Basic Metabolic Panel:  ?Recent Labs  ?Lab 06/16/21 ?1540 06/16/21 ?1552 06/17/21 ?9381  ?NA 140 143  --   ?K 3.9 3.7  --   ?CL 109 106  --   ?CO2 22  --   --   ?GLUCOSE 120* 124*  --   ?BUN 22 28*  --   ?CREATININE 1.61* 1.50* 1.51*  ?CALCIUM 9.1  --   --   ? ?Lipid Panel:  ?Recent Labs  ?Lab 06/17/21 ?0175  ?CHOL 155  ?TRIG 138  ?HDL 48  ?CHOLHDL 3.2  ?VLDL 28  ?Francisco 79  ? ?HgbA1c:  ?Recent Labs  ?Lab 06/17/21 ?1025  ?HGBA1C 6.3*  ? ?Urine Drug Screen:  ?Recent Labs  ?Lab 06/16/21 ?1308  ?LABOPIA NONE DETECTED  ?COCAINSCRNUR NONE DETECTED  ?LABBENZ NONE DETECTED  ?AMPHETMU NONE DETECTED  ?THCU NONE DETECTED  ?LABBARB NONE DETECTED  ?  ?Alcohol Level  ?Recent Labs  ?Lab 06/16/21 ?1540  ?ETH <10  ? ? ?IMAGING past 24 hours ?CT HEAD WO CONTRAST ? ?Result Date: 06/16/2021 ?CLINICAL DATA:  Neurological deficit EXAM: CT HEAD WITHOUT CONTRAST TECHNIQUE: Contiguous axial images were obtained from the base of the skull through the vertex without intravenous contrast. RADIATION DOSE REDUCTION: This exam was performed according to the departmental dose-optimization program which includes automated exposure control, adjustment of the mA and/or kV according to patient size and/or use of  iterative reconstruction technique. COMPARISON:  06/13/2021 FINDINGS: Brain: No acute intracranial findings are seen. There are no signs of bleeding within the cranium. Cortical sulci are prominent. There is decreased density in the periventricular and subcortical white matter. Vascular: Scattered arterial calcifications are seen. Skull: No fracture is seen in the calvarium. Hyperostosis frontalis interna is seen. There is interval decrease in left frontal scalp hematoma. Sinuses/Orbits: There is mild mucosal thickening in the ethmoid sinus. There is mucous retention cyst in the right maxillary sinus. There is mucosal thickening and possibly small air-fluid level in the right side sphenoid sinus. Other: None IMPRESSION: No acute intracranial findings are seen.  Atrophy. Chronic sinusitis. Electronically Signed   By: Elmer Picker M.D.   On: 06/16/2021 12:53  ? ?MR BRAIN WO CONTRAST ? ?Result Date: 06/16/2021 ?CLINICAL DATA:  Neuro deficit, acute, stroke suspected EXAM: MRI HEAD WITHOUT CONTRAST TECHNIQUE: Multiplanar, multiecho pulse sequences of the brain and surrounding structures were obtained without intravenous contrast. COMPARISON:  None Available. FINDINGS: Brain: Acute infarcts in the right basal ganglia, right insula and right parietal lobe. Associated edema without  significant mass effect. No midline shift. No evidence of acute hemorrhage, hydrocephalus, mass lesion or extra-axial fluid collection. Cerebral atrophy with ex vacuo ventricular dilation. Vascular: Poor visualization of the distal right M1 MCA flow void. Skull and upper cervical spine: Normal marrow signal. Sinuses/Orbits: Inferior right maxillary sinus retention cyst. No acute orbital findings. Other: No sizable mastoid effusions. IMPRESSION: 1. Acute infarcts in the right basal ganglia, right insula and right parietal lobe. Associated edema without significant mass effect. 2. Poor visualization of the distal right M1 MCA flow void. This  could potentially be artifactual; however, given the above findings recommend CTA to further evaluate for stenosis or occlusion. Electronically Signed   By: Margaretha Sheffield M.D.   On: 06/16/2021 17:30   ? ?PHYSICAL EXAM ?General:  Alert, well-nourished, well-developed patient in no acute distress ?Respiratory: Regular, unlabored respirations on room air ? ?NEURO:  ?Mental Status: AA&Ox3  ?Speech/Language: speech is without dysarthria or aphasia.  Fluency, and comprehension intact. ? ?Cranial Nerves:  ?II: PERRL. Visual fields full.  ?III, IV, VI: EOMI. Eyelids elevate symmetrically.  ?V: Sensation is intact to light touch and symmetrical to face.  ?VII: Left sided facial droop present ?VIII: hearing intact to voice. ?IX, X:Phonation is normal.  ?WJ:[id number] shrug 5/5. ?XII: tongue is midline without fasciculations. ?Motor: 5/5 strength to RUE, 4/5 strength to LUE and BLE ?Tone: is normal and bulk is normal ?Sensation- Intact to light touch bilaterally.  ?Coordination: FTN intact bilaterally.No drift.  ?Gait- deferred ? ?ASSESSMENT/PLAN ?Ms. Jozy Morford is a 88 y.o. female with history of CAD, DM, HTN, atrial fibrillation (not on anticoagulation) and GI bleed presenting with left-sided weakness that occurred upon awakening and was unable to get out of bed.  Of note, she had a fall on Sunday with LOC.  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 ? ?Stroke:  right MCA infarct in basal ganglia, insula and parietal lobe likely secondary to atrial fibrillation without anticoagulation ?CT head No acute abnormality. Atrophy.  ?MRI  acute infarcts in right basal ganglia, right insula and right parietal lobe ?MRA  pending ?Carotid Doppler unremarkable ?2D Echo pending ?LDL 79 ?HgbA1c 6.3 ?VTE prophylaxis - eliquis ?No antithrombotic prior to admission, now on aspirin 81 mg daily. Will consider full anticoagulation with Eliquis after discussed with Dr. Doylene Canard and pt and  family ?Therapy recommendations:  CIR ?Disposition:  pending ? ?Atrial Fibrillation ?Patient has a history of atrial fibrillation ?On diltiazem and metoprolol at home ?Not on anticoagulation due to GI bleed last year ?Will consider full anticoagulation with Eliquis after discussed with Dr. Doylene Canard and pt and family ? ?Hypertension ?Home meds:  none ?Stable ?Long-term BP goal normotensive ? ?Hyperlipidemia ?Home meds: Pravastatin 40 ?LDL 79, goal < 70 ?Increase to pravastatin 80 mg daily  ?Continue statin at discharge ? ?Diabetes type II Controlled ?Home meds:  metformin 500 mg daily ?HgbA1c 6.3, goal < 7.0 ?CBGs ?SSI ?Close PCP follow-up ? ?Other Stroke Risk Factors ?Advanced Age >/= 11  ?Coronary artery disease ? ?Other Active Problems ?10/2020 GIB - EGD was unremarkable. Colonoscopy was positive for diverticuloses and polyps. ?Recent fall on 06/13/2021, CT negative ? ?Hospital day # 1 ? ?[place] , MSN, AGACNP-BC ?[facility] ?See Amion for schedule and pager information ?06/17/2021 12:17 PM ? ?ATTENDING NOTE: ?I reviewed above note and agree with the assessment and plan. Pt was seen and examined.  ? ?88 year old female with history of CAD, hypertension, diabetes,  hyperlipidemia, PAF not on AC due to GI bleeding in 10/2020, recent fall hitting left forehead on 06/13/2021 presented with left facial droop and left sided weakness and slurred speech.  CT no acute abnormality.  MRI showed right BG/CR/insular cortex/temporal lobe small infarct.  LDL 79, A1c 6.3.  UDS negative.  UA negative.  2D echo pending.  MRI pending.  WBC 10.6. ? ?On exam, daughter at bedside.  Patient lying in bed, awake alert orientated to place, people, age and months, but not to year.  No aphasia, mild to moderate dysarthria, follows simple commands.  Able to name and repeat.  No gaze palsy, visual field full, left facial droop.  Left upper extremity drift and decreased finger grip.  Left lower extremity equal strength to the  right.  Sensation symmetrical.  Finger-to-nose intact grossly. ? ?Etiology for patient so likely due to A-fib not on [facility].  Patient had GI bleeding in 10/2020, EGD and colonoscopy only found patient have diver

## 2021-06-17 NOTE — Evaluation (Signed)
Occupational Therapy Evaluation ?Patient Details ?Name: Patricia English ?MRN: [id number] ?DOB: [birth year] ?Today's Date: 06/17/2021 ? ? ?History of Present Illness 88 y.o. F admitted on 06/16/21 due to L sided facial droop and weakness. 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 PMH significant for DM2, Afib, CAD, HTN, GI bleeds.  ? ?Clinical Impression ?  ?Pt admitted for concerns listed above. PTA pt reported that she was independent with all ADL's and IADL's, using no DME. At this time, pt presents with increased weakness, balance deficits, and decreased activity tolerance. Overall requiring min-mod A for functional mobility and  min guard to mod A for BADL's. Recommending AIR to maximize her independence, as she was completely independent prior to admission. OT will follow acutely.   ?   ? ?Recommendations for follow up therapy are one component of a multi-disciplinary discharge planning process, led by the attending physician.  Recommendations may be updated based on patient status, additional functional criteria and insurance authorization.  ? ?Follow Up Recommendations ? Acute inpatient rehab (3hours/day)  ?  ?Assistance Recommended at Discharge Frequent or constant Supervision/Assistance  ?Patient can return home with the following A little help with walking and/or transfers;A lot of help with bathing/dressing/bathroom;Assistance with cooking/housework;Direct supervision/assist for medications management;Help with stairs or ramp for entrance;Assist for transportation ? ?  ?Functional Status Assessment ? Patient has had a recent decline in their functional status and demonstrates the ability to make significant improvements in function in a reasonable and predictable amount of time.  ?Equipment Recommendations ? Other (comment) (TBD)  ?  ?Recommendations for Other Services Rehab consult ? ? ?  ?Precautions / Restrictions Precautions ?Precautions:  Fall ?Restrictions ?Weight Bearing Restrictions: No  ? ?  ? ?Mobility Bed Mobility ?Overal bed mobility: Needs Assistance ?Bed Mobility: Supine to Sit, Sit to Supine ?  ?  ?Supine to sit: Min assist ?Sit to supine: Min guard ?  ?General bed mobility comments: Min A to assist with lifting trunk to sitting, pt able to return to supine with no assist. ?  ? ?Transfers ?Overall transfer level: Needs assistance ?Equipment used: 2 person hand held assist ?Transfers: Sit to/from Stand ?Sit to Stand: Min assist ?  ?  ?  ?  ?  ?General transfer comment: Min A from ED stretcher ?  ? ?  ?Balance Overall balance assessment: Needs assistance ?Sitting-balance support: Single extremity supported, Feet unsupported ?Sitting balance-Leahy Scale: Fair ?Sitting balance - Comments: Mild posterior lean due to weakness ?Postural control: Posterior lean ?Standing balance support: Bilateral upper extremity supported ?Standing balance-Leahy Scale: Poor ?Standing balance comment: Pt with posterior lean upon initial standing, requiring cuing and assist to lean forward. ?  ?  ?  ?  ?  ?  ?  ?  ?  ?  ?  ?   ? ?ADL either performed or assessed with clinical judgement  ? ?ADL Overall ADL's : Needs assistance/impaired ?Eating/Feeding: Set up;Sitting ?  ?Grooming: Set up;Sitting ?  ?Upper Body Bathing: Min guard;Sitting ?  ?Lower Body Bathing: Moderate assistance;Sitting/lateral leans;Sit to/from stand ?  ?Upper Body Dressing : Min guard;Sitting ?  ?Lower Body Dressing: Moderate assistance;Sitting/lateral leans;Sit to/from stand ?  ?Toilet Transfer: Moderate assistance;Ambulation ?  ?Toileting- Clothing Manipulation and Hygiene: Moderate assistance;Sitting/lateral lean;Sit to/from stand ?  ?  ?  ?Functional mobility during ADLs: Moderate assistance ?General ADL Comments: Pt presents with weakness and mild balance deficits, requiring up to mod A for OOB mobility and ADLs  ? ? ? ?  Vision Baseline Vision/History: 1 Wears glasses ?Ability to See in  Adequate Light: 0 Adequate ?Patient Visual Report: No change from baseline ?Vision Assessment?: No apparent visual deficits ?Additional Comments: Pt had a fall on 5/1, her eye is red and swollen from hitting her head, pt reports mild blurriness, however still able to see most things.  ?   ?Perception   ?  ?Praxis   ?  ? ?Pertinent Vitals/Pain Pain Assessment ?Pain Assessment: No/denies pain  ? ? ? ?Hand Dominance Right ?  ?Extremity/Trunk Assessment Upper Extremity Assessment ?Upper Extremity Assessment: Generalized weakness ?  ?Lower Extremity Assessment ?Lower Extremity Assessment: Defer to PT evaluation ?  ?Cervical / Trunk Assessment ?Cervical / Trunk Assessment: Kyphotic ?  ?Communication Communication ?Communication: No difficulties ?  ?Cognition Arousal/Alertness: Awake/alert ?Behavior During Therapy: [facility] for tasks assessed/performed ?Overall Cognitive Status: Within Functional Limits for tasks assessed ?  ?  ?  ?  ?  ?  ?  ?  ?  ?  ?  ?  ?  ?  ?  ?  ?  ?  ?  ?General Comments  VSS on Ra ? ?  ?Exercises   ?  ?Shoulder Instructions    ? ? ?Home Living Family/patient expects to be discharged to:: Private residence ?Living Arrangements: Children ?Available Help at Discharge: Family;Available PRN/intermittently ?Type of Home: House ?Home Access: Stairs to enter ?Entrance Stairs-Number of Steps: 3-4 ?Entrance Stairs-Rails: Right ?Home Layout: One level ?  ?  ?Bathroom Shower/Tub: Tub/shower unit ?  ?Bathroom Toilet: Standard ?  ?  ?Home Equipment: None ?  ?  ?  ? ?  ?Prior Functioning/Environment Prior Level of Function : Independent/Modified Independent ?  ?  ?  ?  ?  ?  ?  ?  ?  ? ?  ?  ?OT Problem List: Decreased strength;Decreased activity tolerance;Impaired balance (sitting and/or standing) ?  ?   ?OT Treatment/Interventions: Self-care/ADL training;Therapeutic exercise;Energy conservation;DME and/or AE instruction;Therapeutic activities;Patient/family education;Balance training  ?  ?OT Goals(Current goals can  be found in the care plan section) Acute Rehab OT Goals ?Patient Stated Goal: To go home ?OT Goal Formulation: With patient/family ?Time For Goal Achievement: 07/01/21 ?Potential to Achieve Goals: Good ?ADL Goals ?Pt Will Perform Grooming: with modified independence;standing ?Pt Will Perform Lower Body Bathing: with modified independence;sitting/lateral leans;sit to/from stand ?Pt Will Perform Lower Body Dressing: with modified independence;sit to/from stand;sitting/lateral leans ?Pt Will Transfer to Toilet: with modified independence;ambulating ?Pt Will Perform Toileting - Clothing Manipulation and hygiene: with modified independence;sitting/lateral leans;sit to/from stand  ?OT Frequency: Min 2X/week ?  ? ?Co-evaluation   ?  ?  ?  ?  ? ?  ?AM-PAC OT "6 Clicks" Daily Activity     ?Outcome Measure Help from another person eating meals?: A Little ?Help from another person taking care of personal grooming?: A Little ?Help from another person toileting, which includes using toliet, bedpan, or urinal?: A Lot ?Help from another person bathing (including washing, rinsing, drying)?: A Lot ?Help from another person to put on and taking off regular upper body clothing?: A Little ?Help from another person to put on and taking off regular lower body clothing?: A Lot ?6 Click Score: 15 ?  ?End of Session Equipment Utilized During Treatment: Gait belt ?Nurse Communication: Mobility status ? ?Activity Tolerance: Patient tolerated treatment well ?Patient left: in bed;with call bell/phone within reach;with family/visitor present ? ?OT Visit Diagnosis: Unsteadiness on feet (R26.81);Other abnormalities of gait and mobility (R26.89);Muscle weakness (generalized) (M62.81)  ?              ?  Time: 3403-5248 ?OT Time Calculation (min): 19 min ?Charges:  OT Evaluation ?$OT Eval Moderate Complexity: 1 Mod ? ?Fredrika Canby H., OTR/L ?Acute Rehabilitation ? ?Clema Skousen Elane Yolanda Bonine ?06/17/2021, 2:34 PM ?

## 2021-06-17 NOTE — ED Notes (Signed)
Got patient straighten up in the bed patient is now sitting up eating with call bell in reach  ?

## 2021-06-17 NOTE — Progress Notes (Signed)
Inpatient Rehab Admissions Coordinator Note:  ? ?Per therapy recommendations patient was screened for CIR candidacy by Michel Santee, PT. At this time, pt appears to be a potential candidate for CIR. I will place an order for rehab consult for full assessment, per our protocol.  Please contact me any with questions.. ? ?Shann Medal, PT, DPT ?[phone] ?06/17/21 ?5:23 PM  ?

## 2021-06-18 LAB — GLUCOSE, CAPILLARY
Glucose-Capillary: 147 mg/dL — ABNORMAL HIGH (ref 70–99)
Glucose-Capillary: 204 mg/dL — ABNORMAL HIGH (ref 70–99)
Glucose-Capillary: 291 mg/dL — ABNORMAL HIGH (ref 70–99)

## 2021-06-18 LAB — ECHOCARDIOGRAM COMPLETE
AR max vel: 1.8 cm2
AV Area VTI: 1.62 cm2
AV Area mean vel: 1.7 cm2
AV Mean grad: 4 mmHg
AV Peak grad: 5.7 mmHg
Ao pk vel: 1.19 m/s
Area-P 1/2: 5.79 cm2
S' Lateral: 2.9 cm

## 2021-06-18 LAB — BASIC METABOLIC PANEL
Anion gap: 9 (ref 5–15)
BUN: 21 mg/dL (ref 8–23)
CO2: 26 mmol/L (ref 22–32)
Calcium: 9 mg/dL (ref 8.9–10.3)
Chloride: 106 mmol/L (ref 98–111)
Creatinine, Ser: 1.35 mg/dL — ABNORMAL HIGH (ref 0.44–1.00)
GFR, Estimated: 37 mL/min — ABNORMAL LOW (ref >60)
Glucose, Bld: 129 mg/dL — ABNORMAL HIGH (ref 70–99)
Potassium: 3.8 mmol/L (ref 3.5–5.1)
Sodium: 141 mmol/L (ref 135–145)

## 2021-06-18 LAB — CBC
HCT: 39.8 % (ref 36.0–46.0)
Hemoglobin: 13.1 g/dL (ref 12.0–15.0)
MCH: 29.3 pg (ref 26.0–34.0)
MCHC: 32.9 g/dL (ref 30.0–36.0)
MCV: 89 fL (ref 80.0–100.0)
Platelets: 231 10*3/uL (ref 150–400)
RBC: 4.47 MIL/uL (ref 3.87–5.11)
RDW: 13.5 % (ref 11.5–15.5)
WBC: 10.5 10*3/uL (ref 4.0–10.5)
nRBC: 0 % (ref 0.0–0.2)

## 2021-06-18 MED ORDER — GLIPIZIDE ER 5 MG PO TB24
5.0000 mg | ORAL_TABLET | Freq: Every evening | ORAL | Status: DC
  Administered 2021-06-18 – 2021-06-20 (×3): 5 mg via ORAL
  Filled 2021-06-18 (×5): qty 1

## 2021-06-18 MED ORDER — ISOSORBIDE MONONITRATE ER 30 MG PO TB24
30.0000 mg | ORAL_TABLET | Freq: Every evening | ORAL | Status: DC
  Administered 2021-06-18: 30 mg via ORAL
  Filled 2021-06-18: qty 1

## 2021-06-18 MED ORDER — DILTIAZEM HCL ER 90 MG PO CP12
180.0000 mg | ORAL_CAPSULE | Freq: Two times a day (BID) | ORAL | Status: DC
  Administered 2021-06-18 – 2021-06-21 (×7): 180 mg via ORAL
  Filled 2021-06-18 (×8): qty 2

## 2021-06-18 MED ORDER — FUROSEMIDE 40 MG PO TABS
40.0000 mg | ORAL_TABLET | ORAL | Status: DC
  Administered 2021-06-18 – 2021-06-21 (×2): 40 mg via ORAL
  Filled 2021-06-18 (×2): qty 1

## 2021-06-18 MED ORDER — FERROUS SULFATE 325 (65 FE) MG PO TABS
325.0000 mg | ORAL_TABLET | Freq: Every day | ORAL | Status: DC
  Administered 2021-06-19 – 2021-06-21 (×3): 325 mg via ORAL
  Filled 2021-06-18 (×3): qty 1

## 2021-06-18 MED ORDER — METOPROLOL TARTRATE 25 MG PO TABS
25.0000 mg | ORAL_TABLET | Freq: Every evening | ORAL | Status: DC
  Administered 2021-06-18: 25 mg via ORAL
  Filled 2021-06-18: qty 1

## 2021-06-18 MED ORDER — VITAMIN D 25 MCG (1000 UNIT) PO TABS
1000.0000 [IU] | ORAL_TABLET | Freq: Every day | ORAL | Status: DC
  Administered 2021-06-18 – 2021-06-21 (×4): 1000 [IU] via ORAL
  Filled 2021-06-18 (×4): qty 1

## 2021-06-18 MED ORDER — METFORMIN HCL 500 MG PO TABS
500.0000 mg | ORAL_TABLET | Freq: Two times a day (BID) | ORAL | Status: DC
  Administered 2021-06-18 – 2021-06-21 (×6): 500 mg via ORAL
  Filled 2021-06-18 (×6): qty 1

## 2021-06-18 MED ORDER — PANTOPRAZOLE SODIUM 40 MG PO TBEC
40.0000 mg | DELAYED_RELEASE_TABLET | Freq: Two times a day (BID) | ORAL | Status: DC
  Administered 2021-06-18 – 2021-06-21 (×7): 40 mg via ORAL
  Filled 2021-06-18 (×7): qty 1

## 2021-06-18 MED ORDER — COENZYME Q10 100 MG PO TABS: 100.0000 mg | ORAL_TABLET | Freq: Every day | ORAL | Status: DC

## 2021-06-18 NOTE — Progress Notes (Signed)
Inpatient Rehabilitation Admissions Coordinator  ? ?I spoke with patient's daughter, Ms Corey Skains, by phone. We discussed goals and expectations of a possible Cir admit. She prefers CIR and not SNF. Family can provide 24/7 assist as needed. Patient was completely independent prior to admit. She could benefit from CIR admit. I await medical workup completion and bed availability. ? ?Danne Baxter, RN, MSN ?Rehab Admissions Coordinator ?[phone] ?06/18/2021 11:41 AM ? ?

## 2021-06-18 NOTE — Evaluation (Signed)
Speech Language Pathology Evaluation ?Patient Details ?Name: Patricia English ?MRN: [id number] ?DOB: 1927/06/24 ?Today's Date: 06/18/2021 ?Time: 1003-1020 ?SLP Time Calculation (min) (ACUTE ONLY): 17 min ? ?Problem List:  ?Patient Active Problem List  ? Diagnosis Date Noted  ? Acute stroke due to ischemia ([place]) 06/16/2021  ? Acute GI bleeding 10/28/2020  ? DM2 (diabetes mellitus, type 2) ([place]) 10/28/2020  ? Weakness 10/28/2020  ?  Class: Acute  ? Acute upper GI bleed 10/28/2020  ? Internal and external bleeding hemorrhoids   ? Acute lower GI bleeding 12/08/2017  ? Dysphagia 08/11/2017  ?  Class: Acute  ? ?Past Medical History:  ?Past Medical History:  ?Diagnosis Date  ? Coronary artery disease   ? Diabetes mellitus without complication ([place])   ? GI bleeding 11/2017  ? Hypertension   ? Internal hemorrhoids   ? ?Past Surgical History:  ?Past Surgical History:  ?Procedure Laterality Date  ? BIOPSY  10/30/2020  ? Procedure: BIOPSY;  Surgeon: Sharyn Creamer, MD;  Location: [place];  Service: Gastroenterology;;  ? BIOPSY  10/31/2020  ? Procedure: BIOPSY;  Surgeon: Sharyn Creamer, MD;  Location: [facility] ENDOSCOPY;  Service: Gastroenterology;;  ? CATARACT EXTRACTION    ? COLONOSCOPY WITH PROPOFOL N/A 10/31/2020  ? Procedure: COLONOSCOPY WITH PROPOFOL;  Surgeon: Sharyn Creamer, MD;  Location: [place];  Service: Gastroenterology;  Laterality: N/A;  ? ESOPHAGOGASTRODUODENOSCOPY (EGD) WITH PROPOFOL N/A 08/13/2017  ? Procedure: ESOPHAGOGASTRODUODENOSCOPY (EGD) WITH PROPOFOL;  Surgeon: [name], MD;  Location: [facility] ENDOSCOPY;  Service: Endoscopy;  Laterality: N/A;  ? ESOPHAGOGASTRODUODENOSCOPY (EGD) WITH PROPOFOL N/A 10/30/2020  ? Procedure: ESOPHAGOGASTRODUODENOSCOPY (EGD) WITH PROPOFOL;  Surgeon: Sharyn Creamer, MD;  Location: [place];  Service: Gastroenterology;  Laterality: N/A;  ? FLEXIBLE SIGMOIDOSCOPY N/A 12/10/2017  ? Procedure: FLEXIBLE SIGMOIDOSCOPY;  Surgeon: [name], MD;  Location: [facility] ENDOSCOPY;   Service: Endoscopy;  Laterality: N/A;  ? HEMORRHOID BANDING  12/10/2017  ? Procedure: HEMORRHOID BANDING;  Surgeon: [name], MD;  Location: [facility] ENDOSCOPY;  Service: Endoscopy;;  ? HEMOSTASIS CLIP PLACEMENT  10/31/2020  ? Procedure: HEMOSTASIS CLIP PLACEMENT;  Surgeon: Sharyn Creamer, MD;  Location: [place];  Service: Gastroenterology;;  ? POLYPECTOMY  10/31/2020  ? Procedure: POLYPECTOMY;  Surgeon: Sharyn Creamer, MD;  Location: [facility] ENDOSCOPY;  Service: Gastroenterology;;  ? ?HPI:  ?88 y.o. F admitted on 06/16/21 due to L sided facial droop and weakness. 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 PMH significant for DM2, Afib, CAD, HTN, GI bleeds.  ? ?Assessment / Plan / Recommendation ?Clinical Impression ? Pt seen for evaluation of cognitive linguistic function. She is 88 years of age with an 11th grade education. Pt reports her son lives with her, but pt manages her finances and medications, cooking and cleaning independently. She is no longer driving. Pt speech is fully intelligible, and receptive and expressive language appear intact. Today, the [place] Mental Status (SLUMS) Examination was administered. Pt scored 22/30, raising concern for mild neurocognitive deficits for her level of education. Pt exhibited difficulty with mental math, digit reversal, and auditory attention and recall. Given pt's age, this may be close to her baseline level of function. Pt plans to transfter to CIR. Recommend further workup with SLP if needs arise. Acute SLP signing off. ?   ?SLP Assessment ? SLP Recommendation/Assessment: All further Speech Language Pathology needs can be addressed in the next venue of care if needs arise.  ? ?  SLP Visit Diagnosis: Cognitive communication deficit (R41.841)  ?  ?Recommendations for follow up therapy are one component of a multi-disciplinary discharge planning process, led by the attending physician.  Recommendations may be  updated based on patient status, additional functional criteria and insurance authorization. ?   ?Follow Up Recommendations ? Acute inpatient rehab (3hours/day)  ?  ?Assistance Recommended at Discharge ? Intermittent Supervision/Assistance  ?   ?SLP Evaluation ?Cognition ? Overall Cognitive Status: Within Functional Limits for tasks assessed ?Arousal/Alertness: Awake/alert ?Orientation Level: Oriented X4 ?Year: 2023 ?Month: May ?Day of Week: Correct  ?  ?   ?Comprehension ? Auditory Comprehension ?Overall Auditory Comprehension: Appears within functional limits for tasks assessed  ?  ?Expression Expression ?Primary Mode of Expression: Verbal ?Verbal Expression ?Overall Verbal Expression: Appears within functional limits for tasks assessed ?Written Expression ?Dominant Hand: Right   ?Oral / Motor ? Oral Motor/Sensory Function ?Overall Oral Motor/Sensory Function: Within functional limits ?Motor Speech ?Overall Motor Speech: Appears within functional limits for tasks assessed ?Articulation: Within functional limitis ?Intelligibility: Intelligible   ?        ? ?Patricia English, MSP, CCC-SLP ?Speech Language Pathologist ?Office: [phone] ? ?Patricia English ?06/18/2021, 10:29 AM ? ?

## 2021-06-18 NOTE — Progress Notes (Signed)
Physical Therapy Treatment ?Patient Details ?Name: Patricia English ?MRN: [id number] ?DOB: October 12, 1927 ?Today's Date: 06/18/2021 ? ? ?History of Present Illness 88 y.o. F admitted on 06/16/21 due to L sided facial droop and weakness. 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 PMH significant for DM2, Afib, CAD, HTN, GI bleeds. ? ?  ?PT Comments  ? ? Pt progressing well toward goals.  Emphasis on safety and technique with sit to stands and control of stand to sit and progression of gait stability/stamina.   ?Recommendations for follow up therapy are one component of a multi-disciplinary discharge planning process, led by the attending physician.  Recommendations may be updated based on patient status, additional functional criteria and insurance authorization. ? ?Follow Up Recommendations ? Acute inpatient rehab (3hours/day) ?  ?  ?Assistance Recommended at Discharge Frequent or constant Supervision/Assistance  ?Patient can return home with the following A little help with walking and/or transfers;A little help with bathing/dressing/bathroom;Assistance with cooking/housework;Assist for transportation;Help with stairs or ramp for entrance ?  ?Equipment Recommendations ? Other (comment) (may need RW, but still TBD)  ?  ?Recommendations for Other Services   ? ? ?  ?Precautions / Restrictions Precautions ?Precautions: Fall  ?  ? ?Mobility ? Bed Mobility ?Overal bed mobility: Needs Assistance ?Bed Mobility: Sit to Supine ?  ?  ?  ?  ?  ?General bed mobility comments: to get pt in a squared up position in the bed, needed min assist, but pt transitioned to supine from sitting with min guard. ?  ? ?Transfers ?Overall transfer level: Needs assistance ?  ?Transfers: Sit to/from Stand ?Sit to Stand: Min assist, Min guard ?  ?  ?  ?  ?  ?General transfer comment: with cuing, demo and practice, pt progressed from min to min guard assist.  Pt still a little unsteady with initial stance  occasionally using the bed frame for stability.  Worked on hand placement to control descent. ?  ? ?Ambulation/Gait ?Ambulation/Gait assistance: Min assist ?Gait Distance (Feet): 105 Feet ?Assistive device: Rolling walker (2 wheels) ?Gait Pattern/deviations: Step-through pattern ?Gait velocity: Decreased ?Gait velocity interpretation: <1.8 ft/sec, indicate of risk for recurrent falls ?  ?General Gait Details: weak heel/to pattern on the L LE.  mildly unsteady with variable step lengths.  Some improvement with cuing, but also muscle fatigue toward end of ambulation. ? ? ?Stairs ?  ?  ?  ?  ?  ? ? ?Wheelchair Mobility ?  ? ?Modified Rankin (Stroke Patients Only) ?Modified Rankin (Stroke Patients Only) ?Pre-Morbid Rankin Score: No symptoms ?Modified Rankin: Moderately severe disability ? ? ?  ?Balance Overall balance assessment: Needs assistance ?Sitting-balance support: Feet supported ?Sitting balance-Leahy Scale: Fair ?  ?  ?Standing balance support: Single extremity supported, Bilateral upper extremity supported, During functional activity ?Standing balance-Leahy Scale: Poor ?Standing balance comment: reliant on the AD or external support, mild posterior lean ?  ?  ?  ?  ?  ?  ?  ?  ?  ?  ?  ?  ? ?  ?Cognition Arousal/Alertness: Awake/alert ?Behavior During Therapy: [facility] for tasks assessed/performed ?Overall Cognitive Status: Within Functional Limits for tasks assessed ?  ?  ?  ?  ?  ?  ?  ?  ?  ?  ?  ?  ?  ?  ?  ?  ?  ?  ?  ? ?  ?Exercises Other Exercises ?Other Exercises: standing hip flexion x 10 reps bil ?  Other Exercises: sit to stand to sit x8 reps with emphasis on appropriate use of UE's and controlled descent. ? ?  ?General Comments   ?  ?  ? ?Pertinent Vitals/Pain Pain Assessment ?Pain Assessment: No/denies pain  ? ? ?Home Living   ?  ?  ?  ?  ?  ?  ?  ?  ?  ?   ?  ?Prior Function    ?  ?  ?   ? ?PT Goals (current goals can now be found in the care plan section) Acute Rehab PT Goals ?Patient Stated Goal: to  be independent ?PT Goal Formulation: With patient/family ?Time For Goal Achievement: 07/01/21 ?Potential to Achieve Goals: Good ?Progress towards PT goals: Progressing toward goals ? ?  ?Frequency ? ? ? Min 4X/week ? ? ? ?  ?PT Plan Current plan remains appropriate  ? ? ?Co-evaluation   ?  ?  ?  ?  ? ?  ?AM-PAC PT "6 Clicks" Mobility   ?Outcome Measure ? Help needed turning from your back to your side while in a flat bed without using bedrails?: A Little ?Help needed moving from lying on your back to sitting on the side of a flat bed without using bedrails?: A Little ?Help needed moving to and from a bed to a chair (including a wheelchair)?: A Little ?Help needed standing up from a chair using your arms (e.g., wheelchair or bedside chair)?: A Little ?Help needed to walk in hospital room?: A Little ?Help needed climbing 3-5 steps with a railing? : A Lot ?6 Click Score: 17 ? ?  ?End of Session   ?Activity Tolerance: Patient tolerated treatment well ?Patient left: in bed;with call bell/phone within reach;with family/visitor present ?Nurse Communication: Mobility status ?PT Visit Diagnosis: Unsteadiness on feet (R26.81);Muscle weakness (generalized) (M62.81) ?  ? ? ?Time: 0141-0301 ?PT Time Calculation (min) (ACUTE ONLY): 20 min ? ?Charges:  $Gait Training: 8-22 mins          ?          ? ?06/18/2021 ? ?Ginger Carne., PT ?Acute Rehabilitation Services ?[phone]  (pager) ?[phone]  (office) ? ? ?[name] ?06/18/2021, 4:05 PM ? ?

## 2021-06-18 NOTE — Progress Notes (Signed)
SLP Cancellation Note ? ?Patient Details ?Name: Patricia English ?MRN: [id number] ?DOB: 05/05/1927 ? ? ?Cancelled treatment:       Reason Eval/Treat Not Completed: Patient at procedure or test/unavailable ? ?Unable to complete SLE at this time. Pt is currently with nursing for toileting needs. Will continue efforts.  ? ?Aqueelah Cotrell B. Rechel Delosreyes, MSP, CCC-SLP ?Speech Language Pathologist ?Office: [phone] ? ?Shonna Chock ?06/18/2021, 9:20 AM ?

## 2021-06-18 NOTE — Progress Notes (Signed)
Patient is from home with daughter. Plan is for CIR. TOC following. ?

## 2021-06-18 NOTE — Progress Notes (Signed)
Ref: [name], MD ? ? ?Subjective:  ?Awake. ?A.fibrillation with RVR. Missed diltiazem dose yesterday. ? ?Objective:  ?Vital Signs in the last 24 hours: ?Temp:  [97.8 ?F (36.6 ?C)-98.4 ?F (36.9 ?C)] 97.8 ?F (36.6 ?C) (05/05 3875) ?Pulse Rate:  [62-102] 86 (05/05 0724) ?Cardiac Rhythm: Atrial fibrillation (05/04 2024) ?Resp:  [14-24] 14 (05/05 0724) ?BP: (114-157)/(54-77) 135/65 (05/05 0724) ?SpO2:  [83 %-100 %] 100 % (05/05 0724) ?Weight:  [75.3 kg] 75.3 kg (05/04 1508) ? ?Physical Exam: ?BP Readings from Last 1 Encounters:  ?06/18/21 135/65  ?   ?Wt Readings from Last 1 Encounters:  ?06/17/21 75.3 kg  ?  Weight change:  Body mass index is 25.98 kg/m?. ?HEENT: [place]/AT, Eyes-Brown, Conjunctiva-Pink,  left eyebrow area hematoma is holding and changing color, Sclera-Non-icteric ?Neck: No JVD, No bruit, Trachea midline. ?Lungs:  Clear, Bilateral. ?Cardiac:  Tachy, Irregular rhythm, normal S1 and S2, no S3. II/VI systolic murmur. ?Abdomen:  Soft, non-tender. BS present. ?Extremities:  No edema present. No cyanosis. No clubbing. ?CNS: AxOx3, Cranial nerves grossly intact, moves all 4 extremities.  ?Skin: Warm and dry. ? ? ?Intake/Output from previous day: ?05/04 0701 - 05/05 0700 ?In: 209.1 [P.O.:80; I.V.:129.1] ?Out: -  ? ? ? ?Lab Results: ?BMET ?   ?Component Value Date/Time  ? NA 141 06/18/2021 0313  ? NA 143 06/16/2021 1552  ? NA 140 06/16/2021 1540  ? K 3.8 06/18/2021 0313  ? K 3.7 06/16/2021 1552  ? K 3.9 06/16/2021 1540  ? CL 106 06/18/2021 0313  ? CL 106 06/16/2021 1552  ? CL 109 06/16/2021 1540  ? CO2 26 06/18/2021 0313  ? CO2 22 06/16/2021 1540  ? CO2 21 (L) 01/19/2021 1703  ? GLUCOSE 129 (H) 06/18/2021 0313  ? GLUCOSE 124 (H) 06/16/2021 1552  ? GLUCOSE 120 (H) 06/16/2021 1540  ? BUN 21 06/18/2021 0313  ? BUN 28 (H) 06/16/2021 1552  ? BUN 22 06/16/2021 1540  ? CREATININE 1.35 (H) 06/18/2021 0313  ? CREATININE 1.51 (H) 06/17/2021 0349  ? CREATININE 1.50 (H) 06/16/2021 1552  ? CALCIUM 9.0 06/18/2021 0313  ?  CALCIUM 9.1 06/16/2021 1540  ? CALCIUM 8.8 (L) 01/19/2021 1703  ? GFRNONAA 37 (L) 06/18/2021 0313  ? GFRNONAA 32 (L) 06/17/2021 0349  ? GFRNONAA 30 (L) 06/16/2021 1540  ? GFRAA 58 (L) 12/10/2017 0015  ? GFRAA >60 12/09/2017 0424  ? GFRAA 57 (L) 12/08/2017 0758  ? ?CBC ?   ?Component Value Date/Time  ? WBC 10.5 06/18/2021 0313  ? RBC 4.47 06/18/2021 0313  ? HGB 13.1 06/18/2021 0313  ? HCT 39.8 06/18/2021 0313  ? PLT 231 06/18/2021 0313  ? MCV 89.0 06/18/2021 0313  ? MCH 29.3 06/18/2021 0313  ? MCHC 32.9 06/18/2021 0313  ? RDW 13.5 06/18/2021 0313  ? LYMPHSABS 2.2 06/16/2021 1540  ? MONOABS 0.8 06/16/2021 1540  ? EOSABS 0.1 06/16/2021 1540  ? BASOSABS 0.0 06/16/2021 1540  ? ?HEPATIC Function Panel ?Recent Labs  ?  10/28/20 ?1816 01/19/21 ?1703 06/16/21 ?1540  ?PROT 7.0 8.1 7.7  ? ?HEMOGLOBIN A1C ?No components found for: HGA1C,  MPG ?CARDIAC ENZYMES ?Lab Results  ?Component Value Date  ? TROPONINI <0.30 11/22/2013  ? TROPONINI <0.30 10/13/2012  ? ?BNP ?No results for input(s): PROBNP in the last 8760 hours. ?TSH ?No results for input(s): TSH in the last 8760 hours. ?CHOLESTEROL ?Recent Labs  ?  06/17/21 ?6433  ?CHOL 155  ? ? ?Scheduled Meds: ?  stroke: early stages of recovery book  Does not apply Once  ? apixaban  5 mg Oral BID  ? aspirin EC  81 mg Oral Daily  ? Cholecalciferol  1,000 Units Oral Daily  ? Coenzyme Q10  100 mg Oral Daily  ? diltiazem  180 mg Oral BID  ? [START ON 06/19/2021] ferrous sulfate  325 mg Oral Q breakfast  ? furosemide  40 mg Oral Q M,W,F  ? glipiZIDE  5 mg Oral QPM  ? insulin aspart  0-15 Units Subcutaneous TID WC  ? isosorbide mononitrate  30 mg Oral QPM  ? metFORMIN  500 mg Oral BID WC  ? metoprolol tartrate  25 mg Oral QPM  ? pantoprazole  40 mg Oral BID  ? pravastatin  80 mg Oral Daily  ? ?Continuous Infusions: ? sodium chloride Stopped (06/17/21 1448)  ? ?PRN Meds:.acetaminophen **OR** acetaminophen (TYLENOL) oral liquid 160 mg/5 mL **OR** acetaminophen,  senna-docusate ? ?Assessment/Plan: ? Acute right basal, insula and parietal lobe infarct ?Left eye brow area hematoma ?Atrial fibrillation with RVR ?HTN ?Type 2 DM ?CAAD ?H/O GI bleed ? ?Plan: ?Resume Diltiazem, metoprolol and other home medications ? ? LOS: 2 days  ? ?Time spent including chart review, lab review, examination, discussion with patient/family : 30 min ? ? ?[name]  MD  ?06/18/2021, 9:44 AM ? ? ? ? ? ?

## 2021-06-18 NOTE — Care Management Important Message (Signed)
Important Message ? ?Patient Details  ?Name: Patricia English ?MRN: [id number] ?Date of Birth: [birth year] ? ? ?Medicare Important Message Given:  Yes ? ? ? ? ?Tristy Udovich ?06/18/2021, 4:08 PM ?

## 2021-06-18 NOTE — PMR Pre-admission (Signed)
PMR Admission Coordinator Pre-Admission Assessment ? ?Patient: Patricia English is an 88 y.o., female ?MRN: [id number] ?DOB: [birth year] ?Height: '5\' 7"'$  (170.2 cm) ?Weight: 75.3 kg ? ?Insurance Information ?HMO:     PPO:      PCP:      IPA:      80/20:      OTHER:  ?PRIMARY: Medicare a and b      Policy#: [id number]      Subscriber: pt ?Benefits:  Phone #: passport one source     Name: 5/5 ?Eff. Date: 01/15/1995     Deduct: $1600      Out of Pocket Max: none      Life Max: none ?CIR: 100%      SNF: 20 full days ?Outpatient: 80%     Co-Pay: 20% ?Home Health: 100%      Co-Pay: none ?DME: 80%     Co-Pay: 20% ?Providers: pt choice ? ?SECONDARY: [employer]      Policy#: [id number] ? ?Financial Counselor:       Phone#:  ? ?The ?Data Collection Information Summary? for patients in Inpatient Rehabilitation Facilities with attached ?Privacy Act [place] Records? was provided and verbally reviewed with: Patient and Family ? ?Emergency Contact Information ?Contact Information   ? ? Name Relation Home Work Mobile  ? [place], Gingerich [phone]    ? [name],Daeja Daughter [phone]    ? ?  ? ?Current Medical History  ?Patient Admitting Diagnosis: CVA ? ?History of Present Illness: 88 year old female with history of CAD, type 2 DM, HTN, atrial fibrillation, GI bleed and hemorrhoids. Presented on 06/16/21 with weakness and left sided facial droop. She has been off anticoagulation due to recurrent GI bleed.  ? ?Neurology consulted. MRI revealed acute infract in the right basal ganglia, right insula and right parietal lobe. Felt likely secondary to atrial fibrillation without anticoagulation. Placed on [place].  GI bleeding in 10/2020 with EGD and colonoscopy found diverticulosis and polyps. Began Eliquis for stroke prevention. On diltiazem and metoprolol at home. LDL 79, increased pravastatin.  ? ?Complete NIHSS TOTAL: 3 ? ?Patient's medical record from [facility] has been reviewed by the rehabilitation admission  coordinator and physician. ? ?Past Medical History  ?Past Medical History:  ?Diagnosis Date  ? Coronary artery disease   ? Diabetes mellitus without complication ([place])   ? GI bleeding 11/2017  ? Hypertension   ? Internal hemorrhoids   ? ?Has the patient had major surgery during 100 days prior to admission? No ? ?Family History   ?family history is not on file. ? ?Current Medications ? ?Current Facility-Administered Medications:  ?   stroke: early stages of recovery book, , Does not apply, Once, [name], MD ?  0.9 %  sodium chloride infusion, , Intravenous, Continuous, [name], MD, Stopped at 06/17/21 1448 ?  acetaminophen (TYLENOL) tablet 650 mg, 650 mg, Oral, Q4H PRN **OR** acetaminophen (TYLENOL) 160 MG/5ML solution 650 mg, 650 mg, Per Tube, Q4H PRN **OR** acetaminophen (TYLENOL) suppository 650 mg, 650 mg, Rectal, Q4H PRN, [name], MD ?  apixaban (ELIQUIS) tablet 2.5 mg, 2.5 mg, Oral, BID, Darlina Sicilian, RPH, 2.5 mg at 06/21/21 1021 ?  cholecalciferol (VITAMIN D3) tablet 1,000 Units, 1,000 Units, Oral, Daily, [name], MD, 1,000 Units at 06/21/21 1021 ?  diltiazem (CARDIZEM SR) 12 hr capsule 180 mg, 180 mg, Oral, BID, [name], MD, 180 mg at 06/21/21 1024 ?  ferrous sulfate tablet 325 mg, 325 mg, Oral, Q breakfast, [name],  MD, 325 mg at 06/21/21 0815 ?  furosemide (LASIX) tablet 40 mg, 40 mg, Oral, Q M,W,F, [name], MD, 40 mg at 06/21/21 1021 ?  glipiZIDE (GLUCOTROL XL) 24 hr tablet 5 mg, 5 mg, Oral, QPM, [name], MD, 5 mg at 06/20/21 1712 ?  insulin aspart (novoLOG) injection 0-15 Units, 0-15 Units, Subcutaneous, TID WC, [name], MD, 3 Units at 06/20/21 1712 ?  levalbuterol (XOPENEX) nebulizer solution 0.63 mg, 0.63 mg, Nebulization, Q8H PRN, Charolette Forward, MD ?  metFORMIN (GLUCOPHAGE) tablet 500 mg, 500 mg, Oral, BID WC, [name], MD, 500 mg at 06/21/21 0815 ?  metoprolol tartrate (LOPRESSOR) tablet 12.5 mg, 12.5 mg, Oral, QPM, Harwani, Mohan, MD,  12.5 mg at 06/20/21 1712 ?  pantoprazole (PROTONIX) EC tablet 40 mg, 40 mg, Oral, BID, [name], MD, 40 mg at 06/21/21 1021 ?  pravastatin (PRAVACHOL) tablet 80 mg, 80 mg, Oral, Daily, Rosalin Hawking, MD, 80 mg at 06/21/21 1021 ?  senna-docusate (Senokot-S) tablet 1 tablet, 1 tablet, Oral, QHS PRN, [name], MD ? ?Patients Current Diet:  ?Diet Order   ? ?       ?  Diet regular Room service appropriate? Yes; Fluid consistency: Thin  Diet effective now       ?  ? ?  ?  ? ?  ? ?Precautions / Restrictions ?Precautions ?Precautions: Fall ?Restrictions ?Weight Bearing Restrictions: No  ? ?Has the patient had 2 or more falls or a fall with injury in the past year? No ? ?Prior Activity Level ?Community (5-7x/wk): independent; self sufficient per daughter ? ?Prior Functional Level ?Self Care: Did the patient need help bathing, dressing, using the toilet or eating? Independent ? ?Indoor Mobility: Did the patient need assistance with walking from room to room (with or without device)? Independent ? ?Stairs: Did the patient need assistance with internal or external stairs (with or without device)? Independent ? ?Functional Cognition: Did the patient need help planning regular tasks such as shopping or remembering to take medications? Independent ? ?Patient Information ?Are you of Hispanic, Latino/a,or Spanish origin?: A. No, not of Hispanic, Latino/a, or Spanish origin ?What is your race?: B. Black or African American ?Do you need or want an interpreter to communicate with a doctor or health care staff?: 0. No ? ?Patient's Response To:  ?Health Literacy and Transportation ?Is the patient able to respond to health literacy and transportation needs?: Yes ?Health Literacy - How often do you need to have someone help you when you read instructions, pamphlets, or other written material from your doctor or pharmacy?: Never ?In the past 12 months, has lack of transportation kept you from medical appointments or from getting  medications?: No ?In the past 12 months, has lack of transportation kept you from meetings, work, or from getting things needed for daily living?: No ? ?Home Assistive Devices / Equipment ?Home Assistive Devices/Equipment: None ?Home Equipment: None ? ?Prior Device Use: Indicate devices/aids used by the patient prior to current illness, exacerbation or injury? None of the above ? ?Current Functional Level ?Cognition ? Arousal/Alertness: Awake/alert ?Overall Cognitive Status: Within Functional Limits for tasks assessed ?Orientation Level: Oriented X4 ?   ?Extremity Assessment ?(includes Sensation/Coordination) ? Upper Extremity Assessment: Defer to OT evaluation  ?Lower Extremity Assessment: LLE deficits/detail ?LLE Deficits / Details: LLE grossly 3+/5 throughout  ?  ?ADLs ? Overall ADL's : Needs assistance/impaired ?Eating/Feeding: Set up, Sitting ?Grooming: Min guard, Standing, Wash/dry hands, Wash/dry face, Applying deodorant, Oral care, Brushing hair ?Grooming Details (indicate  cue type and reason): limited tolerance in standing during ADLs ?Upper Body Bathing: Set up, Sitting ?Lower Body Bathing: Minimal assistance, Sit to/from stand ?Upper Body Dressing : Set up, Sitting ?Upper Body Dressing Details (indicate cue type and reason): new gown ?Lower Body Dressing: Moderate assistance, Sitting/lateral leans, Sit to/from stand ?Lower Body Dressing Details (indicate cue type and reason): difficulty reaching feet to doff or don. may benefit from A/E? ?Toilet Transfer: Minimal assistance, Ambulation, Rolling walker (2 wheels) ?Toileting- Clothing Manipulation and Hygiene: Moderate assistance, Sitting/lateral lean, Sit to/from stand ?Functional mobility during ADLs: Minimal assistance, Rolling walker (2 wheels), Cueing for safety ?General ADL Comments: cueing for RW management, safety  ?  ?Mobility ? Overal bed mobility: Needs Assistance ?Bed Mobility: Sit to Supine ?Supine to sit: Min guard ?Sit to supine: Min  guard ?General bed mobility comments: for safety, increased time and HOB elevated  ?  ?Transfers ? Overall transfer level: Needs assistance ?Equipment used: Rolling walker (2 wheels) ?Transfers: Sit to/from Stand ?Si

## 2021-06-19 LAB — GLUCOSE, CAPILLARY
Glucose-Capillary: 158 mg/dL — ABNORMAL HIGH (ref 70–99)
Glucose-Capillary: 154 mg/dL — ABNORMAL HIGH (ref 70–99)
Glucose-Capillary: 151 mg/dL — ABNORMAL HIGH (ref 70–99)
Glucose-Capillary: 141 mg/dL — ABNORMAL HIGH (ref 70–99)

## 2021-06-19 MED ORDER — METOPROLOL TARTRATE 12.5 MG HALF TABLET
12.5000 mg | ORAL_TABLET | Freq: Every evening | ORAL | Status: DC
Start: 2021-06-19 — End: 2021-06-21
  Administered 2021-06-19 – 2021-06-20 (×2): 12.5 mg via ORAL
  Filled 2021-06-19 (×2): qty 1

## 2021-06-19 MED ORDER — LEVALBUTEROL HCL 0.63 MG/3ML IN NEBU: 0.6300 mg | INHALATION_SOLUTION | Freq: Three times a day (TID) | RESPIRATORY_TRACT | Status: DC | PRN

## 2021-06-19 NOTE — Progress Notes (Signed)
Subjective:  ?Patient denies any chest pain or shortness of breath at present.  States had difficulty breathing last night required breathing treatment with improvement in her breathing.  No further episodes of A-fib with RVR. ? ?Objective:  ?Vital Signs in the last 24 hours: ?Temp:  [97.8 ?F (36.6 ?C)-98.6 ?F (37 ?C)] 98.4 ?F (36.9 ?C) (05/06 3354) ?Pulse Rate:  [66-102] 66 (05/06 0731) ?Resp:  [18-20] 20 (05/06 0731) ?BP: (97-137)/(50-88) 97/59 (05/06 0731) ?SpO2:  [99 %-100 %] 100 % (05/06 0731) ? ?Intake/Output from previous day: ?05/05 0701 - 05/06 0700 ?In: 900 [P.O.:900] ?Out: 400 [Urine:400] ?Intake/Output from this shift: ?Total I/O ?In: 240 [P.O.:240] ?Out: -  ? ?Physical Exam: ?Neck: no adenopathy, no carotid bruit, no JVD, and supple, symmetrical, trachea midline ?Lungs: clear to auscultation bilaterally ?Heart: irregularly irregular rhythm, S1, S2 normal, and 2/6 systolic murmur noted ?Abdomen: soft, non-tender; bowel sounds normal; no masses,  no organomegaly ?Extremities: extremities normal, atraumatic, no cyanosis or edema ? ?Lab Results: ?Recent Labs  ?  06/17/21 ?5625 06/18/21 ?0313  ?WBC 10.6* 10.5  ?HGB 13.3 13.1  ?PLT 234 231  ? ?Recent Labs  ?  06/16/21 ?1540 06/16/21 ?1552 06/17/21 ?6389 06/18/21 ?0313  ?NA 140 143  --  141  ?K 3.9 3.7  --  3.8  ?CL 109 106  --  106  ?CO2 22  --   --  26  ?GLUCOSE 120* 124*  --  129*  ?BUN 22 28*  --  21  ?CREATININE 1.61* 1.50* 1.51* 1.35*  ? ?No results for input(s): TROPONINI in the last 72 hours. ? ?Invalid input(s): CK, MB ?Hepatic Function Panel ?Recent Labs  ?  06/16/21 ?1540  ?PROT 7.7  ?ALBUMIN 4.0  ?AST 29  ?ALT 9  ?ALKPHOS 75  ?BILITOT 1.7*  ? ?Recent Labs  ?  06/17/21 ?3734  ?CHOL 155  ? ?No results for input(s): PROTIME in the last 72 hours. ? ?Imaging: ?Imaging results have been reviewed and MR ANGIO HEAD WO CONTRAST ? ?Result Date: 06/17/2021 ?CLINICAL DATA:  Stroke, follow-up. Right brain strokes by previous MR. EXAM: MRA HEAD WITHOUT CONTRAST  TECHNIQUE: Angiographic images of the Circle of Willis were acquired using MRA technique without intravenous contrast. COMPARISON:  MRI yesterday. FINDINGS: Anterior circulation: Both internal carotid arteries are patent through the skull base and siphon regions. The left anterior and middle cerebral arteries are normal. The right anterior and middle cerebral vessel arteries are patent. The right MCA is tortuous in there is signal loss related to caudal directed flow. I do not believe this represents a true large vessel lesion. More distal branch vessels show flow, though there is a missing M2 branch in the inferior division. Posterior circulation: Normal Anatomic variants: None other significant. Other: None. IMPRESSION: I believe there is artifactual signal loss in the right MCA bifurcation region because the vessel turns caudally. I do not think there is an M1 occlusion. More distal branch vessels show flow, which would not be the case if there were an M1 occlusion. There does appear to be a missing M2 branch, probably within the inferior division. Electronically Signed   By: Nelson Chimes M.D.   On: 06/17/2021 21:45  ? ?ECHOCARDIOGRAM COMPLETE ? ?Result Date: 06/18/2021 ?   ECHOCARDIOGRAM REPORT   Patient Name:   Patricia English Date of Exam: 06/17/2021 Medical Rec #:  [id number]       Height:       67.0 in Accession #:    [id number]  Weight:       169.0 lb Date of Birth:  [birth year]      BSA:          1.883 m? Patient Age:    88 years        BP:           120/115 mmHg Patient Gender: F               HR:           97 bpm. Exam Location:  Inpatient Procedure: 2D Echo, Cardiac Doppler and Color Doppler Indications:     Stroke  History:         Patient has prior history of Echocardiogram examinations, most                  recent 10/29/2020. Risk Factors:Hypertension, Diabetes and CAD.  Sonographer:     Joette Catching RCS Referring Phys:  [place] Diagnosing Phys: [name] MD IMPRESSIONS  1. Left  ventricular ejection fraction, by estimation, is 55 to 60%. The left ventricle has normal function. The left ventricle has no regional wall motion abnormalities. There is mild concentric left ventricular hypertrophy. Left ventricular diastolic parameters are indeterminate.  2. Right ventricular systolic function is normal. The right ventricular size is normal.  3. Left atrial size was mild to moderately dilated.  4. Right atrial size was mildly dilated.  5. The pericardial effusion is anterior to the right ventricle. There is no evidence of cardiac tamponade.  6. The mitral valve is degenerative. Mild mitral valve regurgitation. Moderate mitral annular calcification.  7. Tricuspid valve regurgitation is moderate.  8. The aortic valve is tricuspid. There is mild calcification of the aortic valve. There is mild thickening of the aortic valve. Aortic valve regurgitation is not visualized. Aortic valve sclerosis is present, with no evidence of aortic valve stenosis.  9. There is Moderate (Grade III) atheroma plaque involving the ascending aorta. 10. The inferior vena cava is normal in size with greater than 50% respiratory variability, suggesting right atrial pressure of 3 mmHg. FINDINGS  Left Ventricle: Left ventricular ejection fraction, by estimation, is 55 to 60%. The left ventricle has normal function. The left ventricle has no regional wall motion abnormalities. The left ventricular internal cavity size was normal in size. There is  mild concentric left ventricular hypertrophy. Left ventricular diastolic parameters are indeterminate. Right Ventricle: The right ventricular size is normal. No increase in right ventricular wall thickness. Right ventricular systolic function is normal. Left Atrium: Left atrial size was mild to moderately dilated. Right Atrium: Right atrial size was mildly dilated. Pericardium: Trivial pericardial effusion is present. The pericardial effusion is anterior to the right ventricle. There  is no evidence of cardiac tamponade. Mitral Valve: The mitral valve is degenerative in appearance. There is mild thickening of the mitral valve leaflet(s). There is moderate calcification of the mitral valve leaflet(s). Moderate mitral annular calcification. Mild mitral valve regurgitation. Tricuspid Valve: The tricuspid valve is normal in structure. Tricuspid valve regurgitation is moderate. Aortic Valve: The aortic valve is tricuspid. There is mild calcification of the aortic valve. There is mild thickening of the aortic valve. Aortic valve regurgitation is not visualized. Aortic valve sclerosis is present, with no evidence of aortic valve stenosis. Aortic valve mean gradient measures 4.0 mmHg. Aortic valve peak gradient measures 5.7 mmHg. Aortic valve area, by VTI measures 1.62 cm?. Pulmonic Valve: The pulmonic valve was normal in structure. Pulmonic valve regurgitation is trivial. Aorta:  The aortic root is normal in size and structure. There is moderate (Grade III) atheroma plaque involving the ascending aorta. Venous: The inferior vena cava is normal in size with greater than 50% respiratory variability, suggesting right atrial pressure of 3 mmHg. IAS/Shunts: The atrial septum is grossly normal.  LEFT VENTRICLE PLAX 2D LVIDd:         4.10 cm   Diastology LVIDs:         2.90 cm   LV e' medial:    6.31 cm/s LV PW:         1.10 cm   LV E/e' medial:  14.3 LV IVS:        1.20 cm   LV e' lateral:   8.59 cm/s LVOT diam:     2.00 cm   LV E/e' lateral: 10.5 LV SV:         34 LV SV Index:   18 LVOT Area:     3.14 cm?  RIGHT VENTRICLE          IVC RV Basal diam:  3.65 cm  IVC diam: 1.70 cm RV Mid diam:    2.70 cm LEFT ATRIUM             Index        RIGHT ATRIUM           Index LA diam:        3.90 cm 2.07 cm/m?   RA Area:     16.00 cm? LA Vol (A2C):   43.8 ml 23.27 ml/m?  RA Volume:   34.00 ml  18.06 ml/m? LA Vol (A4C):   53.9 ml 28.63 ml/m? LA Biplane Vol: 52.1 ml 27.68 ml/m?  AORTIC VALVE                    PULMONIC  VALVE AV Area (Vmax):    1.80 cm?     PV Vmax:          1.21 m/s AV Area (Vmean):   1.70 cm?     PV Peak grad:     5.9 mmHg AV Area (VTI):     1.62 cm?     PR End Diast Vel: 6.12 msec AV Vmax:           119.00 cm

## 2021-06-19 NOTE — Progress Notes (Signed)
Physical Therapy Treatment ?Patient Details ?Name: Patricia English ?MRN: [id number] ?DOB: February 26, 1927 ?Today's Date: 06/19/2021 ? ? ?History of Present Illness 88 y.o. F admitted on 06/16/21 due to L sided facial droop and weakness. 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 PMH significant for DM2, Afib, CAD, HTN, GI bleeds. ? ?  ?PT Comments  ? ? Pt required min assist sit to stand, and min assist ambulation 180' with RW. Difficulty maneuvering around obstacles on L. Decreased foot clearance on L with fatigue. Pt in recliner with feet elevated at end of session. Current POC remains appropriate. ?   ?Recommendations for follow up therapy are one component of a multi-disciplinary discharge planning process, led by the attending physician.  Recommendations may be updated based on patient status, additional functional criteria and insurance authorization. ? ?Follow Up Recommendations ? Acute inpatient rehab (3hours/day) ?  ?  ?Assistance Recommended at Discharge Frequent or constant Supervision/Assistance  ?Patient can return home with the following A little help with walking and/or transfers;A little help with bathing/dressing/bathroom;Assistance with cooking/housework;Assist for transportation;Help with stairs or ramp for entrance ?  ?Equipment Recommendations ? Rolling walker (2 wheels)  ?  ?Recommendations for Other Services   ? ? ?  ?Precautions / Restrictions Precautions ?Precautions: Fall  ?  ? ?Mobility ? Bed Mobility ?  ?  ?  ?  ?  ?  ?  ?General bed mobility comments: Pt in recliner. ?  ? ?Transfers ?Overall transfer level: Needs assistance ?Equipment used: Rolling walker (2 wheels) ?Transfers: Sit to/from Stand ?Sit to Stand: Min assist ?  ?  ?  ?  ?  ?General transfer comment: increased time, cues for sequencing and hand placement ?  ? ?Ambulation/Gait ?Ambulation/Gait assistance: Min assist ?Gait Distance (Feet): 180 Feet ?Assistive device: Rolling walker (2  wheels) ?Gait Pattern/deviations: Step-through pattern ?Gait velocity: decreased ?Gait velocity interpretation: <1.31 ft/sec, indicative of household ambulator ?  ?General Gait Details: Assist to maintain balance and manage RW on L. Difficulty maneuvering around obstacles on L. Decreased foot clearance on L with fatigue. ? ? ?Stairs ?  ?  ?  ?  ?  ? ? ?Wheelchair Mobility ?  ? ?Modified Rankin (Stroke Patients Only) ?Modified Rankin (Stroke Patients Only) ?Pre-Morbid Rankin Score: No symptoms ?Modified Rankin: Moderately severe disability ? ? ?  ?Balance Overall balance assessment: Needs assistance ?Sitting-balance support: Feet supported, No upper extremity supported ?Sitting balance-Leahy Scale: Fair ?  ?  ?Standing balance support: During functional activity, Bilateral upper extremity supported, Reliant on assistive device for balance ?Standing balance-Leahy Scale: Poor ?  ?  ?  ?  ?  ?  ?  ?  ?  ?  ?  ?  ?  ? ?  ?Cognition Arousal/Alertness: Awake/alert ?Behavior During Therapy: Flat affect ?Overall Cognitive Status: Within Functional Limits for tasks assessed ?  ?  ?  ?  ?  ?  ?  ?  ?  ?  ?  ?  ?  ?  ?  ?  ?  ?  ?  ? ?  ?Exercises   ? ?  ?General Comments   ?  ?  ? ?Pertinent Vitals/Pain Pain Assessment ?Pain Assessment: No/denies pain  ? ? ?Home Living   ?  ?  ?  ?  ?  ?  ?  ?  ?  ?   ?  ?Prior Function    ?  ?  ?   ? ?PT  Goals (current goals can now be found in the care plan section) Acute Rehab PT Goals ?Patient Stated Goal: to be independent ?Progress towards PT goals: Progressing toward goals ? ?  ?Frequency ? ? ? Min 4X/week ? ? ? ?  ?PT Plan Current plan remains appropriate  ? ? ?Co-evaluation   ?  ?  ?  ?  ? ?  ?AM-PAC PT "6 Clicks" Mobility   ?Outcome Measure ? Help needed turning from your back to your side while in a flat bed without using bedrails?: A Little ?Help needed moving from lying on your back to sitting on the side of a flat bed without using bedrails?: A Little ?Help needed moving to and  from a bed to a chair (including a wheelchair)?: A Little ?Help needed standing up from a chair using your arms (e.g., wheelchair or bedside chair)?: A Little ?Help needed to walk in hospital room?: A Little ?Help needed climbing 3-5 steps with a railing? : A Lot ?6 Click Score: 17 ? ?  ?End of Session Equipment Utilized During Treatment: Gait belt ?Activity Tolerance: Patient tolerated treatment well ?Patient left: in chair;with call bell/phone within reach;with chair alarm set ?Nurse Communication: Mobility status ?PT Visit Diagnosis: Unsteadiness on feet (R26.81);Muscle weakness (generalized) (M62.81) ?  ? ? ?Time: 9935-7017 ?PT Time Calculation (min) (ACUTE ONLY): 14 min ? ?Charges:  $Gait Training: 8-22 mins          ?          ? ?Lorrin Goodell, PT  ?Office # [phone] ?Pager [phone] ? ? ? ?[name] ?06/19/2021, 12:09 PM ? ?

## 2021-06-19 NOTE — Progress Notes (Signed)
Occupational Therapy Treatment ?Patient Details ?Name: Patricia English ?MRN: [id number] ?DOB: 1927/04/12 ?Today's Date: 06/19/2021 ? ? ?History of present illness 88 y.o. F admitted on 06/16/21 due to L sided facial droop and weakness. 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 PMH significant for DM2, Afib, CAD, HTN, GI bleeds. ?  ?OT comments ? Pt. Seen for skilled OT treatment session.  Making gains with stated goals.  Mod a for LB dressing. May benefit from A/E trial next session.  Able to state need for rest break in between each don/doff of sock.  Reviewed energy conservation strategies with pt. And she was receptive.  Agree with current d/c recommendations as pt. Would benefit from continued therapies prior to home.    ? ?Recommendations for follow up therapy are one component of a multi-disciplinary discharge planning process, led by the attending physician.  Recommendations may be updated based on patient status, additional functional criteria and insurance authorization. ?   ?Follow Up Recommendations ? Acute inpatient rehab (3hours/day)  ?  ?Assistance Recommended at Discharge Frequent or constant Supervision/Assistance  ?Patient can return home with the following ? A little help with walking and/or transfers;A lot of help with bathing/dressing/bathroom;Assistance with cooking/housework;Direct supervision/assist for medications management;Help with stairs or ramp for entrance;Assist for transportation ?  ?Equipment Recommendations ? Other (comment)  ?  ?Recommendations for Other Services Rehab consult ? ?  ?Precautions / Restrictions Precautions ?Precautions: Fall  ? ? ?  ? ?Mobility Bed Mobility ?  ?  ?  ?  ?  ?  ?  ?General bed mobility comments: seated in recliner for duration of session ?  ? ?Transfers ?  ?  ?  ?  ?  ?  ?  ?  ?  ?  ?  ?  ?Balance   ?  ?  ?  ?  ?  ?  ?  ?  ?  ?  ?  ?  ?  ?  ?  ?  ?  ?  ?   ? ?ADL either performed or assessed with clinical  judgement  ? ?ADL Overall ADL's : Needs assistance/impaired ?  ?  ?  ?  ?  ?  ?  ?  ?  ?  ?Lower Body Dressing: Moderate assistance;Sitting/lateral leans;Sit to/from stand ?Lower Body Dressing Details (indicate cue type and reason): difficulty reaching feet to doff or don. may benefit from A/E?  States "im tired" after 1st sock.  Reviewed recommendation to take rest breaks as needed during any adls.  Also reviewed energy conservation strategies.   ?  ?  ?  ?  ?  ?  ?  ?General ADL Comments: pt. seated in recliner at beginning of session declines standing task or need for b.room. agreeable to seated LB dressing task ?  ? ?Extremity/Trunk Assessment   ?  ?  ?  ?  ?  ? ?Vision   ?  ?  ?Perception   ?  ?Praxis   ?  ? ?Cognition Arousal/Alertness: Awake/alert ?Behavior During Therapy: Flat affect ?Overall Cognitive Status: Within Functional Limits for tasks assessed ?  ?  ?  ?  ?  ?  ?  ?  ?  ?  ?  ?  ?  ?  ?  ?  ?  ?  ?  ?   ?Exercises   ? ?  ?Shoulder Instructions   ? ? ?  ?General Comments    ? ? ?  Pertinent Vitals/ Pain       Pain Assessment ?Pain Assessment: No/denies pain ? ?Home Living   ?  ?  ?  ?  ?  ?  ?  ?  ?  ?  ?  ?  ?  ?  ?  ?  ?  ?  ? ?  ?Prior Functioning/Environment    ?  ?  ?  ?   ? ?Frequency ? Min 2X/week  ? ? ? ? ?  ?Progress Toward Goals ? ?OT Goals(current goals can now be found in the care plan section) ? Progress towards OT goals: Progressing toward goals ? ?   ?Plan Discharge plan remains appropriate   ? ?Co-evaluation ? ? ?   ?  ?  ?  ?  ? ?  ?AM-PAC OT "6 Clicks" Daily Activity     ?Outcome Measure ? ? Help from another person eating meals?: A Little ?Help from another person taking care of personal grooming?: A Little ?Help from another person toileting, which includes using toliet, bedpan, or urinal?: A Lot ?Help from another person bathing (including washing, rinsing, drying)?: A Lot ?Help from another person to put on and taking off regular upper body clothing?: A Little ?Help from another  person to put on and taking off regular lower body clothing?: A Lot ?6 Click Score: 15 ? ?  ?End of Session   ? ?OT Visit Diagnosis: Unsteadiness on feet (R26.81);Other abnormalities of gait and mobility (R26.89);Muscle weakness (generalized) (M62.81) ?  ?Activity Tolerance Patient tolerated treatment well ?  ?Patient Left in chair;with call bell/phone within reach ?  ?Nurse Communication   ?  ? ?   ? ?Time: 7340-3709 ?OT Time Calculation (min): 11 min ? ?Charges: OT General Charges ?$OT Visit: 1 Visit ?OT Treatments ?$Self Care/Home Management : 8-22 mins ? ?Sonia Baller, COTA/L ?Acute Rehabilitation ?[phone]  ? ?Tanya Nones ?06/19/2021, 11:31 AM ?

## 2021-06-20 LAB — BASIC METABOLIC PANEL
Anion gap: 7 (ref 5–15)
BUN: 30 mg/dL — ABNORMAL HIGH (ref 8–23)
CO2: 25 mmol/L (ref 22–32)
Calcium: 8.9 mg/dL (ref 8.9–10.3)
Chloride: 105 mmol/L (ref 98–111)
Creatinine, Ser: 1.61 mg/dL — ABNORMAL HIGH (ref 0.44–1.00)
GFR, Estimated: 30 mL/min — ABNORMAL LOW (ref >60)
Glucose, Bld: 122 mg/dL — ABNORMAL HIGH (ref 70–99)
Potassium: 3.9 mmol/L (ref 3.5–5.1)
Sodium: 137 mmol/L (ref 135–145)

## 2021-06-20 LAB — GLUCOSE, CAPILLARY
Glucose-Capillary: 102 mg/dL — ABNORMAL HIGH (ref 70–99)
Glucose-Capillary: 140 mg/dL — ABNORMAL HIGH (ref 70–99)
Glucose-Capillary: 175 mg/dL — ABNORMAL HIGH (ref 70–99)
Glucose-Capillary: 151 mg/dL — ABNORMAL HIGH (ref 70–99)

## 2021-06-20 MED ORDER — APIXABAN 2.5 MG PO TABS
2.5000 mg | ORAL_TABLET | Freq: Two times a day (BID) | ORAL | Status: DC
  Administered 2021-06-20 – 2021-06-21 (×3): 2.5 mg via ORAL
  Filled 2021-06-20 (×3): qty 1

## 2021-06-20 NOTE — Progress Notes (Signed)
ANTICOAGULATION CONSULT NOTE - Follow Up Consult ? ?Pharmacy Consult for Apixaban ?Indication: atrial fibrillation and stroke ? ?Allergies  ?Allergen Reactions  ? Lisinopril   ?  This caused laryngeal edema  ? ? ?Patient Measurements: ?Height: '5\' 7"'$  (170.2 cm) ?Weight: 75.3 kg (165 lb 14.4 oz) ?IBW/kg (Calculated) : 61.6 kg ? ?Vital Signs: ?Temp: 97.5 ?F (36.4 ?C) (05/07 0740) ?Temp Source: Oral (05/07 0740) ?BP: 115/56 (05/07 0740) ?Pulse Rate: 77 (05/07 0740) ? ?Labs: ?Recent Labs  ?  06/18/21 ?0313 06/20/21 ?0140  ?HGB 13.1  --   ?HCT 39.8  --   ?PLT 231  --   ?CREATININE 1.35* 1.61*  ? ? ?Estimated Creatinine Clearance: 24.1 mL/min (A) (by C-G formula based on SCr of 1.61 mg/dL (H)). ? ? ?Medications:  ?Medications Prior to Admission  ?Medication Sig Dispense Refill Last Dose  ? Cholecalciferol 25 MCG (1000 UT) tablet Take 1,000 Units by mouth daily.   06/15/2021  ? Coenzyme Q10 100 MG TABS Take 100 mg by mouth daily.   06/15/2021  ? diltiazem (DILACOR XR) 180 MG 24 hr capsule Take 1 capsule (180 mg total) by mouth daily. (Patient taking differently: Take 180 mg by mouth 2 (two) times daily.)   06/16/2021  ? ferrous sulfate 325 (65 FE) MG tablet Take 325 mg by mouth daily with breakfast.   06/15/2021  ? furosemide (LASIX) 40 MG tablet Take 1 tablet (40 mg total) by mouth every Monday, Wednesday, and Friday. (Patient taking differently: Take 40 mg by mouth daily.)   06/15/2021  ? glipiZIDE (GLUCOTROL XL) 5 MG 24 hr tablet Take 5 mg by mouth every evening.    06/15/2021  ? isosorbide mononitrate (IMDUR) 30 MG 24 hr tablet Take 30 mg by mouth every evening.    06/15/2021  ? metFORMIN (GLUCOPHAGE) 500 MG tablet Take 500 mg by mouth 2 (two) times daily with a meal.   06/15/2021  ? metoprolol (LOPRESSOR) 50 MG tablet Take 25 mg by mouth every evening.   06/15/2021 at 8 pm  ? pantoprazole (PROTONIX) 40 MG tablet Take 1 tablet (40 mg total) by mouth 2 (two) times daily. 180 tablet 3 06/15/2021  ? pravastatin (PRAVACHOL) 40 MG tablet Take  40 mg by mouth daily.  3 06/15/2021  ? ondansetron (ZOFRAN) 4 MG tablet Take 1 tablet (4 mg total) by mouth every 6 (six) hours. (Patient not taking: Reported on 06/16/2021) 12 tablet 0 Not Taking  ? ?Scheduled:  ?  stroke: early stages of recovery book   Does not apply Once  ? apixaban  2.5 mg Oral BID  ? cholecalciferol  1,000 Units Oral Daily  ? diltiazem  180 mg Oral BID  ? ferrous sulfate  325 mg Oral Q breakfast  ? furosemide  40 mg Oral Q M,W,F  ? glipiZIDE  5 mg Oral QPM  ? insulin aspart  0-15 Units Subcutaneous TID WC  ? metFORMIN  500 mg Oral BID WC  ? metoprolol tartrate  12.5 mg Oral QPM  ? pantoprazole  40 mg Oral BID  ? pravastatin  80 mg Oral Daily  ? ? ?Assessment: ?88 yo female presenting with stroke, history of atrial fibrillation and recent GI bleed (10/2020). Per chart review, the patient was previously on warfarin, but was discontinued in 2014 due to GI bleeding. PTA the patient is not on anticoagulation. Pharmacy is consulted to dose apixaban. ? ?The patient is currently on apixaban PO 5 mg BID for atrial fibrillation, however, will adjust dose  to 2.5 mg BID as the patient is >43 years old and has a SCr >1.5. The patient's SCr has ranged from 1.35-1.61 while admitted, but tends to hover around 1.5 which is the dose adjustment cutoff. Due to the patient's borderline SCr, history of bleeding, and overall fall/bleed risk, will adjust the patient's apixaban dose at this time. ? ?Goal of Therapy:  ?Monitor platelets by anticoagulation protocol: Yes ?  ?Plan:  ?Decrease dose of apixaban PO to 2.5 mg BID ?Monitor for signs and symptoms of bleeding ?Watch for any necessary dose adjustments ?Provide medication counseling prior to discharge ? ?Shauna Hugh, PharmD, RPh  ?PGY-2 Pharmacy Resident ?06/20/2021 9:58 AM ? ?Please check [email or web address] for unit-specific pharmacy phone numbers. ? ? ?

## 2021-06-20 NOTE — Progress Notes (Signed)
Subjective:  ?Patient denies any complaints up in chair.  Remains in A-fib with controlled ventricular response.  Blood pressure improved after stopping isosorbide mononitrate and reducing metoprolol.  Creatinine up today. ?Objective:  ?Vital Signs in the last 24 hours: ?Temp:  [97.5 ?F (36.4 ?C)-98.9 ?F (37.2 ?C)] 97.5 ?F (36.4 ?C) (05/07 0740) ?Pulse Rate:  [58-82] 77 (05/07 0740) ?Resp:  [18-20] 18 (05/07 0740) ?BP: (103-115)/(49-61) 115/56 (05/07 0740) ?SpO2:  [98 %-100 %] 100 % (05/07 0740) ? ?Intake/Output from previous day: ?05/06 0701 - 05/07 0700 ?In: 240 [P.O.:240] ?Out: 400 [Urine:400] ?Intake/Output from this shift: ?No intake/output data recorded. ? ?Physical Exam: ?Neck: no adenopathy, no carotid bruit, no JVD, and supple, symmetrical, trachea midline ?Lungs: clear to auscultation bilaterally ?Heart: irregularly irregular rhythm, S1, S2 normal, and 2/6 systolic murmur noted ?Abdomen: soft, non-tender; bowel sounds normal; no masses,  no organomegaly ?Extremities: extremities normal, atraumatic, no cyanosis or edema ? ?Lab Results: ?Recent Labs  ?  06/18/21 ?8295  ?WBC 10.5  ?HGB 13.1  ?PLT 231  ? ?Recent Labs  ?  06/18/21 ?0313 06/20/21 ?0140  ?NA 141 137  ?K 3.8 3.9  ?CL 106 105  ?CO2 26 25  ?GLUCOSE 129* 122*  ?BUN 21 30*  ?CREATININE 1.35* 1.61*  ? ?No results for input(s): TROPONINI in the last 72 hours. ? ?Invalid input(s): CK, MB ?Hepatic Function Panel ?No results for input(s): PROT, ALBUMIN, AST, ALT, ALKPHOS, BILITOT, BILIDIR, IBILI in the last 72 hours. ?No results for input(s): CHOL in the last 72 hours. ?No results for input(s): PROTIME in the last 72 hours. ? ?Imaging: ?Imaging results have been reviewed and No results found. ? ?Cardiac Studies: ? ?Assessment/Plan:  ?Cardioembolic acute right basal, insula and parietal lobe infarct ?Left eye brow area hematoma ?Atrial fibrillation with RVR ?HTN ?Type 2 DM ?CAD ?H/O GI bleed ?Chronic kidney disease stage III ?Plan ?Agree with reducing  Eliquis to 2.5 mg twice daily ?Increase ambulation as tolerated ? LOS: 4 days  ? ? ?Charolette Forward ?06/20/2021, 11:36 AM ? ? ? ?

## 2021-06-20 NOTE — Discharge Instructions (Signed)

## 2021-06-21 ENCOUNTER — Other Ambulatory Visit: Payer: Self-pay

## 2021-06-21 ENCOUNTER — Encounter (HOSPITAL_COMMUNITY): Payer: Self-pay | Admitting: Cardiovascular Disease

## 2021-06-21 ENCOUNTER — Encounter (HOSPITAL_COMMUNITY): Payer: Self-pay | Admitting: Physical Medicine and Rehabilitation

## 2021-06-21 ENCOUNTER — Inpatient Hospital Stay (HOSPITAL_COMMUNITY)
Admission: RE | Admit: 2021-06-21 | Discharge: 2021-06-24 | DRG: 057 | Disposition: A | Discharge status: Home Health | Payer: Medicare Other | Source: Intra-hospital | Attending: Physical Medicine and Rehabilitation | Admitting: Physical Medicine and Rehabilitation

## 2021-06-21 DIAGNOSIS — I10 Essential (primary) hypertension: Secondary | ICD-10-CM

## 2021-06-21 DIAGNOSIS — I4811 Longstanding persistent atrial fibrillation: Secondary | ICD-10-CM | POA: Diagnosis not present

## 2021-06-21 DIAGNOSIS — I3139 Other pericardial effusion (noninflammatory): Secondary | ICD-10-CM | POA: Diagnosis present

## 2021-06-21 DIAGNOSIS — I129 Hypertensive chronic kidney disease with stage 1 through stage 4 chronic kidney disease, or unspecified chronic kidney disease: Secondary | ICD-10-CM | POA: Diagnosis present

## 2021-06-21 DIAGNOSIS — I69354 Hemiplegia and hemiparesis following cerebral infarction affecting left non-dominant side: Principal | ICD-10-CM

## 2021-06-21 DIAGNOSIS — E119 Type 2 diabetes mellitus without complications: Secondary | ICD-10-CM

## 2021-06-21 DIAGNOSIS — I2510 Atherosclerotic heart disease of native coronary artery without angina pectoris: Secondary | ICD-10-CM | POA: Diagnosis present

## 2021-06-21 DIAGNOSIS — Z7901 Long term (current) use of anticoagulants: Secondary | ICD-10-CM | POA: Diagnosis not present

## 2021-06-21 DIAGNOSIS — Z79899 Other long term (current) drug therapy: Secondary | ICD-10-CM | POA: Diagnosis not present

## 2021-06-21 DIAGNOSIS — N189 Chronic kidney disease, unspecified: Secondary | ICD-10-CM | POA: Diagnosis present

## 2021-06-21 DIAGNOSIS — E1122 Type 2 diabetes mellitus with diabetic chronic kidney disease: Secondary | ICD-10-CM | POA: Diagnosis present

## 2021-06-21 DIAGNOSIS — W19XXXD Unspecified fall, subsequent encounter: Secondary | ICD-10-CM | POA: Diagnosis present

## 2021-06-21 DIAGNOSIS — Z6825 Body mass index (BMI) 25.0-25.9, adult: Secondary | ICD-10-CM

## 2021-06-21 DIAGNOSIS — I4891 Unspecified atrial fibrillation: Secondary | ICD-10-CM | POA: Diagnosis present

## 2021-06-21 DIAGNOSIS — Z7984 Long term (current) use of oral hypoglycemic drugs: Secondary | ICD-10-CM | POA: Diagnosis not present

## 2021-06-21 DIAGNOSIS — I69392 Facial weakness following cerebral infarction: Secondary | ICD-10-CM | POA: Diagnosis not present

## 2021-06-21 DIAGNOSIS — E8809 Other disorders of plasma-protein metabolism, not elsewhere classified: Secondary | ICD-10-CM | POA: Diagnosis present

## 2021-06-21 DIAGNOSIS — I082 Rheumatic disorders of both aortic and tricuspid valves: Secondary | ICD-10-CM | POA: Diagnosis present

## 2021-06-21 DIAGNOSIS — Z888 Allergy status to other drugs, medicaments and biological substances status: Secondary | ICD-10-CM | POA: Diagnosis not present

## 2021-06-21 DIAGNOSIS — R32 Unspecified urinary incontinence: Secondary | ICD-10-CM | POA: Diagnosis present

## 2021-06-21 DIAGNOSIS — Z8719 Personal history of other diseases of the digestive system: Secondary | ICD-10-CM

## 2021-06-21 DIAGNOSIS — E663 Overweight: Secondary | ICD-10-CM | POA: Diagnosis present

## 2021-06-21 DIAGNOSIS — I639 Cerebral infarction, unspecified: Secondary | ICD-10-CM

## 2021-06-21 DIAGNOSIS — E1149 Type 2 diabetes mellitus with other diabetic neurological complication: Secondary | ICD-10-CM | POA: Diagnosis not present

## 2021-06-21 DIAGNOSIS — R2681 Unsteadiness on feet: Secondary | ICD-10-CM | POA: Diagnosis present

## 2021-06-21 DIAGNOSIS — D649 Anemia, unspecified: Secondary | ICD-10-CM

## 2021-06-21 DIAGNOSIS — I63511 Cerebral infarction due to unspecified occlusion or stenosis of right middle cerebral artery: Secondary | ICD-10-CM | POA: Diagnosis not present

## 2021-06-21 DIAGNOSIS — I69328 Other speech and language deficits following cerebral infarction: Secondary | ICD-10-CM

## 2021-06-21 DIAGNOSIS — S0012XD Contusion of left eyelid and periocular area, subsequent encounter: Secondary | ICD-10-CM | POA: Diagnosis not present

## 2021-06-21 LAB — CBC WITH DIFFERENTIAL/PLATELET
Abs Immature Granulocytes: 0.07 10*3/uL (ref 0.00–0.07)
Basophils Absolute: 0.1 10*3/uL (ref 0.0–0.1)
Basophils Relative: 0 %
Eosinophils Absolute: 0.2 10*3/uL (ref 0.0–0.5)
Eosinophils Relative: 1 %
HCT: 39.5 % (ref 36.0–46.0)
Hemoglobin: 13 g/dL (ref 12.0–15.0)
Immature Granulocytes: 1 %
Lymphocytes Relative: 19 %
Lymphs Abs: 2.6 10*3/uL (ref 0.7–4.0)
MCH: 29.5 pg (ref 26.0–34.0)
MCHC: 32.9 g/dL (ref 30.0–36.0)
MCV: 89.6 fL (ref 80.0–100.0)
Monocytes Absolute: 1.1 10*3/uL — ABNORMAL HIGH (ref 0.1–1.0)
Monocytes Relative: 8 %
Neutro Abs: 9.7 10*3/uL — ABNORMAL HIGH (ref 1.7–7.7)
Neutrophils Relative %: 71 %
Platelets: 269 10*3/uL (ref 150–400)
RBC: 4.41 MIL/uL (ref 3.87–5.11)
RDW: 13.2 % (ref 11.5–15.5)
WBC: 13.7 10*3/uL — ABNORMAL HIGH (ref 4.0–10.5)
nRBC: 0 % (ref 0.0–0.2)

## 2021-06-21 LAB — BASIC METABOLIC PANEL
Anion gap: 8 (ref 5–15)
BUN: 28 mg/dL — ABNORMAL HIGH (ref 8–23)
CO2: 24 mmol/L (ref 22–32)
Calcium: 8.9 mg/dL (ref 8.9–10.3)
Chloride: 105 mmol/L (ref 98–111)
Creatinine, Ser: 1.36 mg/dL — ABNORMAL HIGH (ref 0.44–1.00)
GFR, Estimated: 37 mL/min — ABNORMAL LOW (ref >60)
Glucose, Bld: 102 mg/dL — ABNORMAL HIGH (ref 70–99)
Potassium: 4.3 mmol/L (ref 3.5–5.1)
Sodium: 137 mmol/L (ref 135–145)

## 2021-06-21 LAB — GLUCOSE, CAPILLARY
Glucose-Capillary: 104 mg/dL — ABNORMAL HIGH (ref 70–99)
Glucose-Capillary: 76 mg/dL (ref 70–99)
Glucose-Capillary: 147 mg/dL — ABNORMAL HIGH (ref 70–99)
Glucose-Capillary: 171 mg/dL — ABNORMAL HIGH (ref 70–99)

## 2021-06-21 LAB — CBC
HCT: 35.6 % — ABNORMAL LOW (ref 36.0–46.0)
Hemoglobin: 11.8 g/dL — ABNORMAL LOW (ref 12.0–15.0)
MCH: 29.5 pg (ref 26.0–34.0)
MCHC: 33.1 g/dL (ref 30.0–36.0)
MCV: 89 fL (ref 80.0–100.0)
Platelets: 231 10*3/uL (ref 150–400)
RBC: 4 MIL/uL (ref 3.87–5.11)
RDW: 13.4 % (ref 11.5–15.5)
WBC: 10.7 10*3/uL — ABNORMAL HIGH (ref 4.0–10.5)
nRBC: 0 % (ref 0.0–0.2)

## 2021-06-21 MED ORDER — METFORMIN HCL 500 MG PO TABS
500.0000 mg | ORAL_TABLET | Freq: Two times a day (BID) | ORAL | Status: DC
  Administered 2021-06-21 – 2021-06-23 (×4): 500 mg via ORAL
  Filled 2021-06-21 (×4): qty 1

## 2021-06-21 MED ORDER — TRAZODONE HCL 50 MG PO TABS: 25.0000 mg | ORAL_TABLET | Freq: Every evening | ORAL | Status: DC | PRN

## 2021-06-21 MED ORDER — ALUM & MAG HYDROXIDE-SIMETH 200-200-20 MG/5ML PO SUSP: 30.0000 mL | ORAL | Status: DC | PRN

## 2021-06-21 MED ORDER — PROCHLORPERAZINE EDISYLATE 10 MG/2ML IJ SOLN: 5.0000 mg | Freq: Four times a day (QID) | INTRAMUSCULAR | Status: DC | PRN

## 2021-06-21 MED ORDER — DIPHENHYDRAMINE HCL 12.5 MG/5ML PO ELIX: 12.5000 mg | ORAL_SOLUTION | Freq: Four times a day (QID) | ORAL | Status: DC | PRN

## 2021-06-21 MED ORDER — METOPROLOL TARTRATE 12.5 MG HALF TABLET
12.5000 mg | ORAL_TABLET | Freq: Every evening | ORAL | Status: DC
  Administered 2021-06-21 – 2021-06-23 (×3): 12.5 mg via ORAL
  Filled 2021-06-21 (×3): qty 1

## 2021-06-21 MED ORDER — GLIPIZIDE ER 5 MG PO TB24
5.0000 mg | ORAL_TABLET | Freq: Every evening | ORAL | Status: DC
  Administered 2021-06-21 – 2021-06-23 (×3): 5 mg via ORAL
  Filled 2021-06-21 (×4): qty 1

## 2021-06-21 MED ORDER — INSULIN ASPART 100 UNIT/ML IJ SOLN
0.0000 [IU] | Freq: Every day | INTRAMUSCULAR | Status: DC
  Administered 2021-06-23: 2 [IU] via SUBCUTANEOUS

## 2021-06-21 MED ORDER — INSULIN ASPART 100 UNIT/ML IJ SOLN
0.0000 [IU] | Freq: Three times a day (TID) | INTRAMUSCULAR | Status: DC
  Administered 2021-06-21: 1 [IU] via SUBCUTANEOUS
  Administered 2021-06-22: 2 [IU] via SUBCUTANEOUS
  Administered 2021-06-22: 1 [IU] via SUBCUTANEOUS

## 2021-06-21 MED ORDER — ACETAMINOPHEN 325 MG PO TABS: 325.0000 mg | ORAL_TABLET | ORAL | Status: DC | PRN

## 2021-06-21 MED ORDER — PROCHLORPERAZINE MALEATE 5 MG PO TABS: 5.0000 mg | ORAL_TABLET | Freq: Four times a day (QID) | ORAL | Status: DC | PRN

## 2021-06-21 MED ORDER — PRAVASTATIN SODIUM 40 MG PO TABS
80.0000 mg | ORAL_TABLET | Freq: Every day | ORAL | Status: DC
  Administered 2021-06-22 – 2021-06-24 (×3): 80 mg via ORAL
  Filled 2021-06-21 (×3): qty 2

## 2021-06-21 MED ORDER — FUROSEMIDE 40 MG PO TABS
40.0000 mg | ORAL_TABLET | ORAL | Status: DC
  Administered 2021-06-23: 40 mg via ORAL
  Filled 2021-06-21: qty 1

## 2021-06-21 MED ORDER — FERROUS SULFATE 325 (65 FE) MG PO TABS
325.0000 mg | ORAL_TABLET | Freq: Every day | ORAL | Status: DC
  Administered 2021-06-22 – 2021-06-24 (×3): 325 mg via ORAL
  Filled 2021-06-21 (×3): qty 1

## 2021-06-21 MED ORDER — POLYETHYLENE GLYCOL 3350 17 G PO PACK: 17.0000 g | PACK | Freq: Every day | ORAL | Status: DC | PRN

## 2021-06-21 MED ORDER — APIXABAN 2.5 MG PO TABS
2.5000 mg | ORAL_TABLET | Freq: Two times a day (BID) | ORAL | Status: DC
Start: 2021-06-21 — End: 2021-06-24
  Administered 2021-06-21 – 2021-06-24 (×6): 2.5 mg via ORAL
  Filled 2021-06-21 (×6): qty 1

## 2021-06-21 MED ORDER — DILTIAZEM HCL ER 90 MG PO CP12
180.0000 mg | ORAL_CAPSULE | Freq: Two times a day (BID) | ORAL | Status: DC
  Administered 2021-06-21 – 2021-06-24 (×6): 180 mg via ORAL
  Filled 2021-06-21 (×7): qty 2

## 2021-06-21 MED ORDER — BISACODYL 10 MG RE SUPP: 10.0000 mg | Freq: Every day | RECTAL | Status: DC | PRN

## 2021-06-21 MED ORDER — VITAMIN D 25 MCG (1000 UNIT) PO TABS
1000.0000 [IU] | ORAL_TABLET | Freq: Every day | ORAL | Status: DC
  Administered 2021-06-22 – 2021-06-24 (×3): 1000 [IU] via ORAL
  Filled 2021-06-21 (×3): qty 1

## 2021-06-21 MED ORDER — PROCHLORPERAZINE 25 MG RE SUPP: 12.5000 mg | Freq: Four times a day (QID) | RECTAL | Status: DC | PRN

## 2021-06-21 MED ORDER — JUVEN PO PACK
1.0000 | PACK | Freq: Two times a day (BID) | ORAL | Status: DC
  Administered 2021-06-22 – 2021-06-24 (×5): 1 via ORAL
  Filled 2021-06-21 (×4): qty 1

## 2021-06-21 MED ORDER — FLEET ENEMA 7-19 GM/118ML RE ENEM: 1.0000 | ENEMA | Freq: Once | RECTAL | Status: DC | PRN

## 2021-06-21 MED ORDER — GUAIFENESIN-DM 100-10 MG/5ML PO SYRP: 5.0000 mL | ORAL_SOLUTION | Freq: Four times a day (QID) | ORAL | Status: DC | PRN

## 2021-06-21 MED ORDER — PANTOPRAZOLE SODIUM 40 MG PO TBEC
40.0000 mg | DELAYED_RELEASE_TABLET | Freq: Two times a day (BID) | ORAL | Status: DC
  Administered 2021-06-21 – 2021-06-24 (×6): 40 mg via ORAL
  Filled 2021-06-21 (×6): qty 1

## 2021-06-21 NOTE — Progress Notes (Signed)
English, Patricia Salk, MD  ?Physician ?Physical Medicine and Rehabilitation ?PMR Pre-admission     ?Signed ?Date of Service:  06/18/2021 12:08 PM ? Related encounter: ED to Hosp-Admission (Current) from 06/16/2021 in [place] ?  ?Signed    ?  ?Show:Clear all ?'[x]'$ Written'[x]'$ Templated'[]'$ Copied ? ?Added by: ?'[x]'$ Patricia Gong, RN'[x]'$ English, Patricia Salk, MD ? ?'[]'$ Hover for details ?   ?   ?   ?   ?   ?   ?   ?   ?   ?   ?   ?   ?   ?   ?   ?   ?   ?   ?   ?   ?   ?   ?   ?   ?   ?   ?   ?   ?   ?   ?   ?   ?   ?   ?   ?   ?   ?   ?   ?   ?   ?   ?   ?   ?   ?   ?   ?   ?   ?   ?   ?   ?   ?   ?   ?   ?   ?   ?   ?   ?   ?   ?   ?   ?   ?   ?   ?   ?   ?   ?   ?   ?   ?   ?   ?   ?   ?   ?   ?   ?   ?   ?   ?   ?   ?   ?   ?   ?   ?   ?   ?   ?   ?   ?   ?   ?   ?   ?   ?   ?   ?   ?   ?   ?   ?   ?   ?   ?   ?   ?   ?   ?   ?   ?   ?   ?   ?   ?   ?   ?   ?   ?   ?   ?   ?   ?   ?   ?   ?   ?   ?   ?   ?   ?   ?   ?   ?   ?   ?   ?   ?PMR Admission Coordinator Pre-Admission Assessment ?  ?Patient: Patricia English is an 88 y.o., female ?MRN: [id number] ?DOB: 1927/04/18 ?Height: '5\' 7"'$  (170.2 cm) ?Weight: 75.3 kg ?  ?Insurance Information ?HMO:     PPO:      PCP:      IPA:      80/20:      OTHER:  ?PRIMARY: Medicare a and b      Policy#: [id number]      Subscriber: pt ?Benefits:  Phone #: passport one source     Name: 5/5 ?Eff. Date: 01/15/1995     Deduct: $1600      Out of Pocket Max: none  Life Max: none ?CIR: 100%      SNF: 20 full days ?Outpatient: 80%     Co-Pay: 20% ?Home Health: 100%      Co-Pay: none ?DME: 80%     Co-Pay: 20% ?Providers: pt choice ? ?SECONDARY: [employer]      Policy#: [id number] ?  ?Financial Counselor:       Phone#:  ?  ?The ?Data Collection Information Summary? for patients in Inpatient Rehabilitation Facilities with attached ?Privacy Act Dasher Records? was provided and verbally reviewed with: Patient and Family ?  ?Emergency Contact Information ?Contact Information    ?  ?  Name Relation Home Work Mobile  ?  Patricia, English [phone]      ?  English,Patricia Daughter [phone]      ?  ?   ?  ?Current Medical History  ?Patient Admitting Diagnosis: CVA ?  ?History of Present Illness: 88 year old female with history of CAD, type 2 DM, HTN, atrial fibrillation, GI bleed and hemorrhoids. Presented on 06/16/21 with weakness and left sided facial droop. She has been off anticoagulation due to recurrent GI bleed.  ?  ?Neurology consulted. MRI revealed acute infract in the right basal ganglia, right insula and right parietal lobe. Felt likely secondary to atrial fibrillation without anticoagulation. Placed on [place].  GI bleeding in 10/2020 with EGD and colonoscopy found diverticulosis and polyps. Began Eliquis for stroke prevention. On diltiazem and metoprolol at home. LDL 79, increased pravastatin.  ?  ?Complete NIHSS TOTAL: 3 ?  ?Patient's medical record from [facility] has been reviewed by the rehabilitation admission coordinator and physician. ?  ?Past Medical History  ?    ?Past Medical History:  ?Diagnosis Date  ? Coronary artery disease    ? Diabetes mellitus without complication ([place])    ? GI bleeding 11/2017  ? Hypertension    ? Internal hemorrhoids    ?  ?Has the patient had major surgery during 100 days prior to admission? No ?  ?Family History   ?family history is not on file. ?  ?Current Medications ?  ?Current Facility-Administered Medications:  ?   stroke: early stages of recovery book, , Does not apply, Once, [name], MD ?  0.9 %  sodium chloride infusion, , Intravenous, Continuous, [name], MD, Stopped at 06/17/21 1448 ?  acetaminophen (TYLENOL) tablet 650 mg, 650 mg, Oral, Q4H PRN **OR** acetaminophen (TYLENOL) 160 MG/5ML solution 650 mg, 650 mg, Per Tube, Q4H PRN **OR** acetaminophen (TYLENOL) suppository 650 mg, 650 mg, Rectal, Q4H PRN, [name], MD ?  apixaban (ELIQUIS) tablet 2.5 mg, 2.5 mg, Oral, BID, Darlina Sicilian, RPH, 2.5 mg at  06/21/21 1021 ?  cholecalciferol (VITAMIN D3) tablet 1,000 Units, 1,000 Units, Oral, Daily, [name], MD, 1,000 Units at 06/21/21 1021 ?  diltiazem (CARDIZEM SR) 12 hr capsule 180 mg, 180 mg, Oral, BID, [name], MD, 180 mg at 06/21/21 1024 ?  ferrous sulfate tablet 325 mg, 325 mg, Oral, Q breakfast, [name], MD, 325 mg at 06/21/21 0815 ?  furosemide (LASIX) tablet 40 mg, 40 mg, Oral, Q M,W,F, [name], MD, 40 mg at 06/21/21 1021 ?  glipiZIDE (GLUCOTROL XL) 24 hr tablet 5 mg, 5 mg, Oral, QPM, [name], MD, 5 mg at 06/20/21 1712 ?  insulin aspart (novoLOG) injection 0-15 Units, 0-15 Units, Subcutaneous, TID WC, [name], MD, 3 Units at 06/20/21 1712 ?  levalbuterol (XOPENEX) nebulizer solution 0.63 mg, 0.63 mg, Nebulization, Q8H PRN, Charolette Forward,  MD ?  metFORMIN (GLUCOPHAGE) tablet 500 mg, 500 mg, Oral, BID WC, [name], MD, 500 mg at 06/21/21 0815 ?  metoprolol tartrate (LOPRESSOR) tablet 12.5 mg, 12.5 mg, Oral, QPM, Harwani, Mohan, MD, 12.5 mg at 06/20/21 1712 ?  pantoprazole (PROTONIX) EC tablet 40 mg, 40 mg, Oral, BID, [name], MD, 40 mg at 06/21/21 1021 ?  pravastatin (PRAVACHOL) tablet 80 mg, 80 mg, Oral, Daily, Rosalin Hawking, MD, 80 mg at 06/21/21 1021 ?  senna-docusate (Senokot-S) tablet 1 tablet, 1 tablet, Oral, QHS PRN, [name], MD ?  ?Patients Current Diet:  ?Diet Order   ?  ?         ?    Diet regular Room service appropriate? Yes; Fluid consistency: Thin  Diet effective now       ?  ?  ?   ?  ?  ?   ?  ?Precautions / Restrictions ?Precautions ?Precautions: Fall ?Restrictions ?Weight Bearing Restrictions: No  ?  ?Has the patient had 2 or more falls or a fall with injury in the past year? No ?  ?Prior Activity Level ?Community (5-7x/wk): independent; self sufficient per daughter ?  ?Prior Functional Level ?Self Care: Did the patient need help bathing, dressing, using the toilet or eating? Independent ?  ?Indoor Mobility: Did the patient need assistance with  walking from room to room (with or without device)? Independent ?  ?Stairs: Did the patient need assistance with internal or external stairs (with or without device)? Independent ?  ?Functional Cognition: Did the patient need help planning regular tasks such as shopping or remembering to take medications? Independent ?  ?Patient Information ?Are you of Hispanic, Latino/a,or Spanish origin?: A. No, not of Hispanic, Latino/a, or Spanish origin ?What is your race?: B. Black or African American ?Do you need or want an interpreter to communicate with a doctor or health care staff?: 0. No ?  ?Patient's Response To:  ?Health Literacy and Transportation ?Is the patient able to respond to health literacy and transportation needs?: Yes ?Health Literacy - How often do you need to have someone help you when you read instructions, pamphlets, or other written material from your doctor or pharmacy?: Never ?In the past 12 months, has lack of transportation kept you from medical appointments or from getting medications?: No ?In the past 12 months, has lack of transportation kept you from meetings, work, or from getting things needed for daily living?: No ?  ?Home Assistive Devices / Equipment ?Home Assistive Devices/Equipment: None ?Home Equipment: None ?  ?Prior Device Use: Indicate devices/aids used by the patient prior to current illness, exacerbation or injury? None of the above ?  ?Current Functional Level ?Cognition ?  Arousal/Alertness: Awake/alert ?Overall Cognitive Status: Within Functional Limits for tasks assessed ?Orientation Level: Oriented X4 ?   ?Extremity Assessment ?(includes Sensation/Coordination) ?  Upper Extremity Assessment: Defer to OT evaluation  ?Lower Extremity Assessment: LLE deficits/detail ?LLE Deficits / Details: LLE grossly 3+/5 throughout  ?   ?ADLs ?  Overall ADL's : Needs assistance/impaired ?Eating/Feeding: Set up, Sitting ?Grooming: Min guard, Standing, Wash/dry hands, Wash/dry face, Applying  deodorant, Oral care, Brushing hair ?Grooming Details (indicate cue type and reason): limited tolerance in standing during ADLs ?Upper Body Bathing: Set up, Sitting ?Lower Body Bathing: Minimal assistance

## 2021-06-21 NOTE — H&P (Signed)
Physical Medicine and Rehabilitation Admission H&P ?  ?  ?   ?Chief Complaint  ?Patient presents with  ? Stroke with functional deficits.   ?  ?  ?  ?HPI:  Patricia English is a 88 year old RH-female with history of T2DM, HTN, CAD, GIB, AF (no AC due to GIB), recent fall 06/13/21--seen in ED w/neg work up who was admitted on 06/16/21 after awakening with left facial droop, slurred speech and left sided weakness.  UDS negative. MRI brain showed acute infarct in right basal ganglia, right insula and right parietal lobe with associated edema and no significant mass effect.  MRI brain showed signal loss right MCA bifurcation without stenosis and felt to have missing branch M2 inferior division.  CTA not done due to CKD.  2D echo showed EF 55 to 65% with trivial pericardial effusion without tamponade, moderate TVR, AV sclerosis and moderate-grade 3 atheroma plaque involving ascending aorta.  Dr. Erlinda Hong felt the stroke was likely secondary to A-fib and Eliquis initiated after discussion with PCP and family.  She was noted to have A-fib with RVR as well as SOB and diltiazem and metoprolol resumed.  Imdur was discontinued due to low blood pressures.  She continues to be limited by left-sided weakness with unsteady gait as well as fatigue.  CIR was recommended due to functional decline. ?  ?  ?Review of Systems  ?Constitutional:  Negative for chills and fever.  ?HENT:  Negative for hearing loss.   ?Eyes:  Negative for blurred vision and double vision.  ?Respiratory:  Negative for cough and shortness of breath.   ?Cardiovascular:  Negative for chest pain, palpitations and leg swelling.  ?Gastrointestinal:  Negative for constipation, heartburn and nausea.  ?Genitourinary:  Negative for dysuria.  ?Musculoskeletal:  Positive for joint pain (left wrist a little sore since the fall). Negative for back pain and myalgias.  ?Skin:  Negative for rash.  ?Neurological:  Positive for weakness and headaches (due to fall have resolved).   ?Psychiatric/Behavioral:  The patient is not nervous/anxious and does not have insomnia.   ?  ?  ?    ?Past Medical History:  ?Diagnosis Date  ? Coronary artery disease    ? Diabetes mellitus without complication ([place])    ? GI bleeding 11/2017  ? Hypertension    ? Internal hemorrhoids    ?  ?  ?     ?Past Surgical History:  ?Procedure Laterality Date  ? BIOPSY   10/30/2020  ?  Procedure: BIOPSY;  Surgeon: Sharyn Creamer, MD;  Location: [place];  Service: Gastroenterology;;  ? BIOPSY   10/31/2020  ?  Procedure: BIOPSY;  Surgeon: Sharyn Creamer, MD;  Location: [facility] ENDOSCOPY;  Service: Gastroenterology;;  ? CATARACT EXTRACTION      ? COLONOSCOPY WITH PROPOFOL N/A 10/31/2020  ?  Procedure: COLONOSCOPY WITH PROPOFOL;  Surgeon: Sharyn Creamer, MD;  Location: [place];  Service: Gastroenterology;  Laterality: N/A;  ? ESOPHAGOGASTRODUODENOSCOPY (EGD) WITH PROPOFOL N/A 08/13/2017  ?  Procedure: ESOPHAGOGASTRODUODENOSCOPY (EGD) WITH PROPOFOL;  Surgeon: [name], MD;  Location: [facility] ENDOSCOPY;  Service: Endoscopy;  Laterality: N/A;  ? ESOPHAGOGASTRODUODENOSCOPY (EGD) WITH PROPOFOL N/A 10/30/2020  ?  Procedure: ESOPHAGOGASTRODUODENOSCOPY (EGD) WITH PROPOFOL;  Surgeon: Sharyn Creamer, MD;  Location: [place];  Service: Gastroenterology;  Laterality: N/A;  ? FLEXIBLE SIGMOIDOSCOPY N/A 12/10/2017  ?  Procedure: FLEXIBLE SIGMOIDOSCOPY;  Surgeon: [name], MD;  Location: [facility] ENDOSCOPY;  Service: Endoscopy;  Laterality: N/A;  ?  HEMORRHOID BANDING   12/10/2017  ?  Procedure: HEMORRHOID BANDING;  Surgeon: [name], MD;  Location: [facility] ENDOSCOPY;  Service: Endoscopy;;  ? HEMOSTASIS CLIP PLACEMENT   10/31/2020  ?  Procedure: HEMOSTASIS CLIP PLACEMENT;  Surgeon: Sharyn Creamer, MD;  Location: [place];  Service: Gastroenterology;;  ? POLYPECTOMY   10/31/2020  ?  Procedure: POLYPECTOMY;  Surgeon: Sharyn Creamer, MD;  Location: [place];  Service: Gastroenterology;;  ?  ?  ?     ?Family History  ?Problem  Relation Age of Onset  ? Diabetes Mother    ? Heart Problems Mother    ?  ?  ?Social History:   Widowed--son lives with her.  Worked in [employer] then [employer] prior to retiring at age 75. 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 She  reports that she has never smoked. She has never used smokeless tobacco. She reports that she does not drink alcohol and does not use drugs. ?  ?  ?     ?Allergies  ?Allergen Reactions  ? Lisinopril    ?    This caused laryngeal edema  ?  ?      ?Medications Prior to Admission  ?Medication Sig Dispense Refill  ? Cholecalciferol 25 MCG (1000 UT) tablet Take 1,000 Units by mouth daily.      ? Coenzyme Q10 100 MG TABS Take 100 mg by mouth daily.      ? diltiazem (DILACOR XR) 180 MG 24 hr capsule Take 1 capsule (180 mg total) by mouth daily. (Patient taking differently: Take 180 mg by mouth 2 (two) times daily.)      ? ferrous sulfate 325 (65 FE) MG tablet Take 325 mg by mouth daily with breakfast.      ? furosemide (LASIX) 40 MG tablet Take 1 tablet (40 mg total) by mouth every Monday, Wednesday, and Friday. (Patient taking differently: Take 40 mg by mouth daily.)      ? glipiZIDE (GLUCOTROL XL) 5 MG 24 hr tablet Take 5 mg by mouth every evening.       ? isosorbide mononitrate (IMDUR) 30 MG 24 hr tablet Take 30 mg by mouth every evening.       ? metFORMIN (GLUCOPHAGE) 500 MG tablet Take 500 mg by mouth 2 (two) times daily with a meal.      ? metoprolol (LOPRESSOR) 50 MG tablet Take 25 mg by mouth every evening.      ? pantoprazole (PROTONIX) 40 MG tablet Take 1 tablet (40 mg total) by mouth 2 (two) times daily. 180 tablet 3  ? pravastatin (PRAVACHOL) 40 MG tablet Take 40 mg by mouth daily.   3  ? ondansetron (ZOFRAN) 4 MG tablet Take 1 tablet (4 mg total) by mouth every 6 (six) hours. (Patient not taking: Reported on 06/16/2021) 12 tablet 0  ?  ?  ?  ?  ?Home: ?Home Living ?Family/patient expects to be discharged to:: Private residence ?Living Arrangements:  (son, Jeneen Rinks ,  lives with patient) ?Available Help at Discharge: Family, Available 24 hours/day (daughter states family can provide 24/7 assist) ?Type of Home: House ?Home Access: Stairs to enter ?Entrance Stairs-Number of Steps: 3-4 ?Entrance Stairs-Rails: Right ?Home Layout: One level ?Bathroom Shower/Tub: Tub/shower unit ?Bathroom Toilet: Standard ?Bathroom Accessibility: Yes ?Home Equipment: None ?Additional Comments: son, Jeneen Rinks., lives with patient ? Lives With: Son ?  ?Functional History: ?Prior Function ?Prior Level of Function : Independent/Modified Independent ?  ?Functional Status:  ?Mobility: ?Bed  Mobility ?Overal bed mobility: Needs Assistance ?Bed Mobility: Sit to Supine ?Supine to sit: Min guard ?Sit to supine: Min guard ?General bed mobility comments: for safety, increased time and HOB elevated ?Transfers ?Overall transfer level: Needs assistance ?Equipment used: Rolling walker (2 wheels) ?Transfers: Sit to/from Stand ?Sit to Stand: Min assist ?General transfer comment: increased time, cues for sequencing and hand placement ?Ambulation/Gait ?Ambulation/Gait assistance: Min assist ?Gait Distance (Feet): 180 Feet ?Assistive device: Rolling walker (2 wheels) ?Gait Pattern/deviations: Step-through pattern ?General Gait Details: Assist to maintain balance and manage RW on L. Difficulty maneuvering around obstacles on L. Decreased foot clearance on L with fatigue. ?Gait velocity: decreased ?Gait velocity interpretation: <1.31 ft/sec, indicative of household ambulator ?  ?ADL: ?ADL ?Overall ADL's : Needs assistance/impaired ?Eating/Feeding: Set up, Sitting ?Grooming: Min guard, Standing, Wash/dry hands, Wash/dry face, Applying deodorant, Oral care, Brushing hair ?Grooming Details (indicate cue type and reason): limited tolerance in standing during ADLs ?Upper Body Bathing: Set up, Sitting ?Lower Body Bathing: Minimal assistance, Sit to/from stand ?Upper Body Dressing : Set up, Sitting ?Upper Body Dressing Details (indicate  cue type and reason): new gown ?Lower Body Dressing: Moderate assistance, Sitting/lateral leans, Sit to/from stand ?Lower Body Dressing Details (indicate cue type and reason): difficulty reaching feet to doff or

## 2021-06-21 NOTE — Plan of Care (Signed)
?  Problem: Health Behavior/Discharge Planning: ?Goal: Ability to manage health-related needs will improve ?Outcome: Progressing ?  ?Problem: Clinical Measurements: ?Goal: Ability to maintain clinical measurements within normal limits will improve ?Outcome: Progressing ?Goal: Will remain free from infection ?Outcome: Progressing ?Goal: Diagnostic test results will improve ?Outcome: Progressing ?Goal: Respiratory complications will improve ?Outcome: Progressing ?Goal: Cardiovascular complication will be avoided ?Outcome: Progressing ?  ?Problem: Activity: ?Goal: Risk for activity intolerance will decrease ?Outcome: Progressing ?  ?Problem: Nutrition: ?Goal: Adequate nutrition will be maintained ?Outcome: Progressing ?  ?Problem: Coping: ?Goal: Level of anxiety will decrease ?Outcome: Progressing ?  ?Problem: Elimination: ?Goal: Will not experience complications related to bowel motility ?Outcome: Progressing ?Goal: Will not experience complications related to urinary retention ?Outcome: Progressing ?  ?Problem: Pain Managment: ?Goal: General experience of comfort will improve ?Outcome: Progressing ?  ?Problem: Safety: ?Goal: Ability to remain free from injury will improve ?Outcome: Progressing ?  ?Problem: Skin Integrity: ?Goal: Risk for impaired skin integrity will decrease ?Outcome: Progressing ?  ?Problem: Education: ?Goal: Knowledge of disease or condition will improve ?Outcome: Progressing ?Goal: Knowledge of secondary prevention will improve (SELECT ALL) ?Outcome: Progressing ?Goal: Knowledge of patient specific risk factors will improve (INDIVIDUALIZE FOR PATIENT) ?Outcome: Progressing ?Goal: Individualized Educational Video(s) ?Outcome: Progressing ?  ?Problem: Coping: ?Goal: Will verbalize positive feelings about self ?Outcome: Progressing ?Goal: Will identify appropriate support needs ?Outcome: Progressing ?  ?Problem: Health Behavior/Discharge Planning: ?Goal: Ability to manage health-related needs will  improve ?Outcome: Progressing ?  ?Problem: Self-Care: ?Goal: Ability to participate in self-care as condition permits will improve ?Outcome: Progressing ?Goal: Verbalization of feelings and concerns over difficulty with self-care will improve ?Outcome: Progressing ?Goal: Ability to communicate needs accurately will improve ?Outcome: Progressing ?  ?Problem: Nutrition: ?Goal: Risk of aspiration will decrease ?Outcome: Progressing ?Goal: Dietary intake will improve ?Outcome: Progressing ?  ?Problem: Ischemic Stroke/TIA Tissue Perfusion: ?Goal: Complications of ischemic stroke/TIA will be minimized ?Outcome: Progressing ?  ?

## 2021-06-21 NOTE — H&P (Incomplete)
? ? ?Physical Medicine and Rehabilitation Admission H&P ? ?  ?Chief Complaint  ?Patient presents with  ? Stroke with functional deficits.   ? ? ? ?HPI:  Patricia English is a 88 year old RH-female with history of T2DM, HTN, CAD, GIB, AF (no AC due to GIB), recent fall 06/13/21--seen in ED w/neg work up who was admitted on 06/16/21 after awakening with left facial droop, slurred speech and left sided weakness.  UDS negative. MRI brain showed acute infarct in right basal ganglia, right insula and right parietal lobe with associated edema and no significant mass effect.  MRI brain showed signal loss right MCA bifurcation without stenosis and felt to have missing branch M2 inferior division.  CTA not done due to CKD.  2D echo showed EF 55 to 65% with trivial pericardial effusion without tamponade, moderate TVR, AV sclerosis and moderate-grade 3 atheroma plaque involving ascending aorta.  Dr. Erlinda Hong felt the stroke was likely secondary to A-fib and Eliquis initiated after discussion with PCP and family.  She was noted to have A-fib with RVR as well as SOB and diltiazem and metoprolol resumed.  Imdur was discontinued due to low blood pressures.  She continues to be limited by left-sided weakness with unsteady gait as well as fatigue.  CIR was recommended due to functional decline. ? ? ?Review of Systems  ?Constitutional:  Negative for chills and fever.  ?HENT:  Negative for hearing loss.   ?Eyes:  Negative for blurred vision and double vision.  ?Respiratory:  Negative for cough and shortness of breath.   ?Cardiovascular:  Negative for chest pain, palpitations and leg swelling.  ?Gastrointestinal:  Negative for constipation, heartburn and nausea.  ?Genitourinary:  Negative for dysuria.  ?Musculoskeletal:  Positive for joint pain (left wrist a little sore since the fall). Negative for back pain and myalgias.  ?Skin:  Negative for rash.  ?Neurological:  Positive for weakness and headaches (due to fall have resolved).   ?Psychiatric/Behavioral:  The patient is not nervous/anxious and does not have insomnia.   ? ? ?Past Medical History:  ?Diagnosis Date  ? Coronary artery disease   ? Diabetes mellitus without complication ([place])   ? GI bleeding 11/2017  ? Hypertension   ? Internal hemorrhoids   ? ? ?Past Surgical History:  ?Procedure Laterality Date  ? BIOPSY  10/30/2020  ? Procedure: BIOPSY;  Surgeon: Sharyn Creamer, MD;  Location: [place];  Service: Gastroenterology;;  ? BIOPSY  10/31/2020  ? Procedure: BIOPSY;  Surgeon: Sharyn Creamer, MD;  Location: [facility] ENDOSCOPY;  Service: Gastroenterology;;  ? CATARACT EXTRACTION    ? COLONOSCOPY WITH PROPOFOL N/A 10/31/2020  ? Procedure: COLONOSCOPY WITH PROPOFOL;  Surgeon: Sharyn Creamer, MD;  Location: [place];  Service: Gastroenterology;  Laterality: N/A;  ? ESOPHAGOGASTRODUODENOSCOPY (EGD) WITH PROPOFOL N/A 08/13/2017  ? Procedure: ESOPHAGOGASTRODUODENOSCOPY (EGD) WITH PROPOFOL;  Surgeon: [name], MD;  Location: [facility] ENDOSCOPY;  Service: Endoscopy;  Laterality: N/A;  ? ESOPHAGOGASTRODUODENOSCOPY (EGD) WITH PROPOFOL N/A 10/30/2020  ? Procedure: ESOPHAGOGASTRODUODENOSCOPY (EGD) WITH PROPOFOL;  Surgeon: Sharyn Creamer, MD;  Location: [place];  Service: Gastroenterology;  Laterality: N/A;  ? FLEXIBLE SIGMOIDOSCOPY N/A 12/10/2017  ? Procedure: FLEXIBLE SIGMOIDOSCOPY;  Surgeon: [name], MD;  Location: [facility] ENDOSCOPY;  Service: Endoscopy;  Laterality: N/A;  ? HEMORRHOID BANDING  12/10/2017  ? Procedure: HEMORRHOID BANDING;  Surgeon: [name], MD;  Location: [facility] ENDOSCOPY;  Service: Endoscopy;;  ? HEMOSTASIS CLIP PLACEMENT  10/31/2020  ? Procedure: HEMOSTASIS CLIP  PLACEMENT;  Surgeon: Sharyn Creamer, MD;  Location: [place];  Service: Gastroenterology;;  ? POLYPECTOMY  10/31/2020  ? Procedure: POLYPECTOMY;  Surgeon: Sharyn Creamer, MD;  Location: [place];  Service: Gastroenterology;;  ? ? ?Family History  ?Problem Relation Age of Onset  ? Diabetes Mother   ?  Heart Problems Mother   ? ? ?Social History:   Widowed--son lives with her.  Worked in [employer] then [employer] prior to retiring at age 39. 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 She  reports that she has never smoked. She has never used smokeless tobacco. She reports that she does not drink alcohol and does not use drugs. ? ? ?Allergies  ?Allergen Reactions  ? Lisinopril   ?  This caused laryngeal edema  ? ?Medications Prior to Admission  ?Medication Sig Dispense Refill  ? Cholecalciferol 25 MCG (1000 UT) tablet Take 1,000 Units by mouth daily.    ? Coenzyme Q10 100 MG TABS Take 100 mg by mouth daily.    ? diltiazem (DILACOR XR) 180 MG 24 hr capsule Take 1 capsule (180 mg total) by mouth daily. (Patient taking differently: Take 180 mg by mouth 2 (two) times daily.)    ? ferrous sulfate 325 (65 FE) MG tablet Take 325 mg by mouth daily with breakfast.    ? furosemide (LASIX) 40 MG tablet Take 1 tablet (40 mg total) by mouth every Monday, Wednesday, and Friday. (Patient taking differently: Take 40 mg by mouth daily.)    ? glipiZIDE (GLUCOTROL XL) 5 MG 24 hr tablet Take 5 mg by mouth every evening.     ? isosorbide mononitrate (IMDUR) 30 MG 24 hr tablet Take 30 mg by mouth every evening.     ? metFORMIN (GLUCOPHAGE) 500 MG tablet Take 500 mg by mouth 2 (two) times daily with a meal.    ? metoprolol (LOPRESSOR) 50 MG tablet Take 25 mg by mouth every evening.    ? pantoprazole (PROTONIX) 40 MG tablet Take 1 tablet (40 mg total) by mouth 2 (two) times daily. 180 tablet 3  ? pravastatin (PRAVACHOL) 40 MG tablet Take 40 mg by mouth daily.  3  ? ondansetron (ZOFRAN) 4 MG tablet Take 1 tablet (4 mg total) by mouth every 6 (six) hours. (Patient not taking: Reported on 06/16/2021) 12 tablet 0  ? ? ? ? ?Home: ?Home Living ?Family/patient expects to be discharged to:: Private residence ?Living Arrangements:  (son, Jeneen Rinks , lives with patient) ?Available Help at Discharge: Family, Available 24 hours/day (daughter  states family can provide 24/7 assist) ?Type of Home: House ?Home Access: Stairs to enter ?Entrance Stairs-Number of Steps: 3-4 ?Entrance Stairs-Rails: Right ?Home Layout: One level ?Bathroom Shower/Tub: Tub/shower unit ?Bathroom Toilet: Standard ?Bathroom Accessibility: Yes ?Home Equipment: None ?Additional Comments: son, Jeneen Rinks., lives with patient ? Lives With: Son ?  ?Functional History: ?Prior Function ?Prior Level of Function : Independent/Modified Independent ? ?Functional Status:  ?Mobility: ?Bed Mobility ?Overal bed mobility: Needs Assistance ?Bed Mobility: Sit to Supine ?Supine to sit: Min guard ?Sit to supine: Min guard ?General bed mobility comments: for safety, increased time and HOB elevated ?Transfers ?Overall transfer level: Needs assistance ?Equipment used: Rolling walker (2 wheels) ?Transfers: Sit to/from Stand ?Sit to Stand: Min assist ?General transfer comment: increased time, cues for sequencing and hand placement ?Ambulation/Gait ?Ambulation/Gait assistance: Min assist ?Gait Distance (Feet): 180 Feet ?Assistive device: Rolling walker (2 wheels) ?Gait Pattern/deviations: Step-through pattern ?General Gait Details: Assist to maintain balance and  manage RW on L. Difficulty maneuvering around obstacles on L. Decreased foot clearance on L with fatigue. ?Gait velocity: decreased ?Gait velocity interpretation: <1.31 ft/sec, indicative of household ambulator ?  ? ?ADL: ?ADL ?Overall ADL's : Needs assistance/impaired ?Eating/Feeding: Set up, Sitting ?Grooming: Min guard, Standing, Wash/dry hands, Wash/dry face, Applying deodorant, Oral care, Brushing hair ?Grooming Details (indicate cue type and reason): limited tolerance in standing during ADLs ?Upper Body Bathing: Set up, Sitting ?Lower Body Bathing: Minimal assistance, Sit to/from stand ?Upper Body Dressing : Set up, Sitting ?Upper Body Dressing Details (indicate cue type and reason): new gown ?Lower Body Dressing: Moderate assistance, Sitting/lateral  leans, Sit to/from stand ?Lower Body Dressing Details (indicate cue type and reason): difficulty reaching feet to doff or don. may benefit from A/E? ?Toilet Transfer: Minimal assistance, Ambulation, Rolling walker

## 2021-06-21 NOTE — Progress Notes (Addendum)
Inpatient Rehabilitation Admissions Coordinator  ? ?I met with patient at bedside. I await Cir bed availability to admit. Hopeful over the next 24 hrs. ? ? , RN, MSN ?Rehab Admissions Coordinator ?[phone] ?06/21/2021 10:31 AM ? ?CIR bed has become available to day. I contacted Dr Kadakia and will make the arrangements to admit today. Patient is in agreement. ? ? , RN, MSN ?Rehab Admissions Coordinator ?[phone] ?06/21/2021 11:24 AM ? ?

## 2021-06-21 NOTE — Progress Notes (Signed)
Occupational Therapy Treatment ?Patient Details ?Name: Patricia English ?MRN: [id number] ?DOB: [birth year] ?Today's Date: 06/21/2021 ? ? ?History of present illness 88 y.o. F admitted on 06/16/21 due to L sided facial droop and weakness. 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 PMH significant for DM2, Afib, CAD, HTN, GI bleeds. ?  ?OT comments ? Pt supine in bed and eager to engage in ADLs.  Pt engaged in tasks at sink, limited standing tolerance and balance.  When engaging in grooming/bathing, patient leans forward onto sink and relies on at least 1 UE support.  Completes bathing with up to min assist, transfers and mobility using RW with min assist.  Cueing for RW mgmt, safety. Continue to recommend AIR at dc.  Will follow acutely.   ? ?Recommendations for follow up therapy are one component of a multi-disciplinary discharge planning process, led by the attending physician.  Recommendations may be updated based on patient status, additional functional criteria and insurance authorization. ?   ?Follow Up Recommendations ? Acute inpatient rehab (3hours/day)  ?  ?Assistance Recommended at Discharge Frequent or constant Supervision/Assistance  ?Patient can return home with the following ? A little help with walking and/or transfers;Assistance with cooking/housework;Direct supervision/assist for medications management;Help with stairs or ramp for entrance;Assist for transportation;A little help with bathing/dressing/bathroom;Direct supervision/assist for financial management ?  ?Equipment Recommendations ? Other (comment) (defer)  ?  ?Recommendations for Other Services Rehab consult ? ?  ?Precautions / Restrictions Precautions ?Precautions: Fall ?Restrictions ?Weight Bearing Restrictions: No  ? ? ?  ? ?Mobility Bed Mobility ?Overal bed mobility: Needs Assistance ?Bed Mobility: Sit to Supine ?  ?  ?Supine to sit: Min guard ?  ?  ?General bed mobility comments: for safety,  increased time and HOB elevated ?  ? ?Transfers ?  ?  ?  ?  ?  ?  ?  ?  ?  ?  ?  ?  ?Balance Overall balance assessment: Needs assistance ?Sitting-balance support: No upper extremity supported, Feet supported ?Sitting balance-Leahy Scale: Fair ?  ?  ?Standing balance support: Bilateral upper extremity supported, Single extremity supported, During functional activity ?Standing balance-Leahy Scale: Poor ?Standing balance comment: relies on at least 1 UE and foward lean at sink during ADLs, BUE support dynamically ?  ?  ?  ?  ?  ?  ?  ?  ?  ?  ?  ?   ? ?ADL either performed or assessed with clinical judgement  ? ?ADL Overall ADL's : Needs assistance/impaired ?  ?  ?Grooming: Min guard;Standing;Wash/dry hands;Wash/dry face;Applying deodorant;Oral care;Brushing hair ?Grooming Details (indicate cue type and reason): limited tolerance in standing during ADLs ?Upper Body Bathing: Set up;Sitting ?  ?Lower Body Bathing: Minimal assistance;Sit to/from stand ?  ?Upper Body Dressing : Set up;Sitting ?Upper Body Dressing Details (indicate cue type and reason): new gown ?  ?  ?Toilet Transfer: Minimal assistance;Ambulation;Rolling walker (2 wheels) ?  ?  ?  ?  ?  ?Functional mobility during ADLs: Minimal assistance;Rolling walker (2 wheels);Cueing for safety ?General ADL Comments: cueing for RW management, safety ?  ? ?Extremity/Trunk Assessment   ?  ?  ?  ?  ?  ? ?Vision   ?  ?  ?Perception   ?  ?Praxis   ?  ? ?Cognition Arousal/Alertness: Awake/alert ?Behavior During Therapy: Flat affect ?Overall Cognitive Status: Within Functional Limits for tasks assessed ?  ?  ?  ?  ?  ?  ?  ?  ?  ?  ?  ?  ?  ?  ?  ?  ?  ?  ?  ?   ?  Exercises   ? ?  ?Shoulder Instructions   ? ? ?  ?General Comments VSS, encouraged use of commode for toileting but pt reports wearing depends at home due to leakage- RN notified and plans to replace purewick  ? ? ?Pertinent Vitals/ Pain       Pain Assessment ?Pain Assessment: No/denies pain ? ?Home Living   ?  ?   ?  ?  ?  ?  ?  ?  ?  ?  ?  ?  ?  ?  ?  ?  ?  ?  ? ?  ?Prior Functioning/Environment    ?  ?  ?  ?   ? ?Frequency ? Min 2X/week  ? ? ? ? ?  ?Progress Toward Goals ? ?OT Goals(current goals can now be found in the care plan section) ? Progress towards OT goals: Progressing toward goals ? ?Acute Rehab OT Goals ?Patient Stated Goal: get to rehab ?OT Goal Formulation: With patient ?Time For Goal Achievement: 07/01/21 ?Potential to Achieve Goals: Good  ?Plan Discharge plan remains appropriate;Frequency remains appropriate   ? ?Co-evaluation ? ? ?   ?  ?  ?  ?  ? ?  ?AM-PAC OT "6 Clicks" Daily Activity     ?Outcome Measure ? ? Help from another person eating meals?: A Little ?Help from another person taking care of personal grooming?: A Little ?Help from another person toileting, which includes using toliet, bedpan, or urinal?: A Little ?Help from another person bathing (including washing, rinsing, drying)?: A Little ?Help from another person to put on and taking off regular upper body clothing?: A Little ?Help from another person to put on and taking off regular lower body clothing?: A Lot ?6 Click Score: 17 ? ?  ?End of Session Equipment Utilized During Treatment: Rolling walker (2 wheels) ? ?OT Visit Diagnosis: Unsteadiness on feet (R26.81);Other abnormalities of gait and mobility (R26.89);Muscle weakness (generalized) (M62.81) ?  ?Activity Tolerance Patient tolerated treatment well ?  ?Patient Left in chair;with call bell/phone within reach;with chair alarm set ?  ?Nurse Communication Mobility status;Other (comment) (purewick) ?  ? ?   ? ?Time: 1660-6301 ?OT Time Calculation (min): 23 min ? ?Charges: OT General Charges ?$OT Visit: 1 Visit ?OT Treatments ?$Self Care/Home Management : 23-37 mins ? ?Jolaine Artist, OT ?Acute Rehabilitation Services ?Pager [phone] ?Office [phone] ? ?Delight Stare ?06/21/2021, 10:51 AM ?

## 2021-06-21 NOTE — Progress Notes (Signed)
Inpatient Rehabilitation Admission Medication Review by a Pharmacist ? ?A complete drug regimen review was completed for this patient to identify any potential clinically significant medication issues. ? ?High Risk Drug Classes Is patient taking? Indication by Medication  ?Antipsychotic Yes Compazine-nausea  ?Anticoagulant Yes Apixaban-afib/stroke  ?Antibiotic No   ?Opioid No   ?Antiplatelet No   ?Hypoglycemics/insulin Yes Glucotrol XL/metformin/SSI-DM  ?Vasoactive Medication Yes Diltiazem SR/metoprolol-HR   ?Chemotherapy No   ?Other Yes Protonix-H/o GIB ?Furosemide-fluid ?Vitamin D-supplement ?Ferrous sulfate-supplement ?Tylenol--prn pain ?Maalox-prn indigestion ?Benadryl-prn itching ?Robitussin DM-prn cough  ? ? ? ?Type of Medication Issue Identified Description of Issue Recommendation(s)  ?Drug Interaction(s) (clinically significant) ?    ?Duplicate Therapy ?    ?Allergy ?    ?No Medication Administration End Date ?    ?Incorrect Dose ?    ?Additional Drug Therapy Needed ?    ?Significant med changes from prior encounter (inform family/care partners about these prior to discharge). Pt was ordered Lasix MWF prior to admit was taking daily.  ?Pt was taking Coenzyme Q 10. ?Imdur dc'd due to low BP. ?Metoprolol dose dec.from '25mg'$  to 12.'5mg'$  daily. ?Pravastatin inc. from '40mg'$  to 80 mg. ?Zofran q6 hr on PTA. Pt not taking. ? Please reinforce to family what dose of Lasix she should be taking. ?Please specify to pt if she can continue Coenzyme Q 10. ? ? ? ?Please make sure Zofran is not reodered on discharge.  ?Other ?    ? ? ?Clinically significant medication issues were identified that warrant physician communication and completion of prescribed/recommended actions by midnight of the next day:  No ? ? ?Pharmacist comments: As noted, GFR in need to be monitored since pt on metformin and has CKD.  ? ?Time spent performing this drug regimen review (minutes):  10 ? ? ?Blenda Nicely, RPh ?Clinical Pharmacist ?06/21/2021 6:11  PM ?

## 2021-06-21 NOTE — TOC Transition Note (Signed)
Transition of Care (TOC) - CM/SW Discharge Note ? ? ?Patient Details  ?Name: Lora Chavers ?MRN: [id number] ?Date of Birth: January 17, 1928 ? ?Transition of Care (TOC) CM/SW Contact:  ?Pollie Friar, RN ?Phone Number: ?06/21/2021, 11:28 AM ? ? ?Clinical Narrative:    ?Patient is discharging to CIR today. CM signing off.  ? ? ?Final next level of care: [facility] ?Barriers to Discharge: No Barriers Identified ? ? ?Patient Goals and CMS Choice ?  ?CMS Medicare.gov Compare Post Acute Care list provided to:: Patient Represenative (must comment) ?Choice offered to / list presented to : Adult Children ? ?Discharge Placement ?  ?           ?  ?  ?  ?  ? ?Discharge Plan and Services ?  ?  ?           ?  ?  ?  ?  ?  ?  ?  ?  ?  ?  ? ?Social Determinants of Health (SDOH) Interventions ?  ? ? ?Readmission Risk Interventions ?   ? View : No data to display.  ?  ?  ?  ? ? ? ? ? ?

## 2021-06-21 NOTE — Discharge Instructions (Addendum)
Inpatient Rehab Discharge Instructions ? ?Patricia English ?Discharge date and time: 06/24/21  ? ?Activities/Precautions/ Functional Status: ?Activity: no lifting, driving, or strenuous exercise till cleared by MD ?Diet: cardiac diet and diabetic diet ?Wound Care: none needed ? ? ?Functional status:  ?___ No restrictions             ___ Walk up steps independently ?_X__ 24/7 supervision/assistance    ___ Walk up steps with assistance ?___ Intermittent supervision/assistance  ___ Bathe/dress independently ?___ Walk with walker     _X__ Bathe/dress with assistance ?___ Walk Independently            ___ Shower independently ?___ Walk with assistance            ___ Shower with assistance ?_X__ No alcohol                    ___ Return to work/school ________ ? ? ?Special Instructions: ? ?COMMUNITY REFERRALS UPON DISCHARGE:   ? ?Home Health:   PT & OT  ?               [place]  Phone: [phone] ? ?Medical Equipment/Items Ordered:ROLLING WALKER AND 3 IN 1 ?                                                Agency/Supplier:[employer]  [phone] ? ? ? ?My questions have been answered and I understand these instructions. I will adhere to these goals and the provided educational materials after my discharge from the hospital. ? ?Patient/Caregiver Signature _______________________________ Date __________ ? ?Clinician Signature _______________________________________ Date __________ ? ?Please bring this form and your medication list with you to all your follow-up doctor's appointments.  Information on my medicine - ELIQUIS? (apixaban) ? ?This medication education was reviewed with me or my healthcare representative as part of my discharge preparation.  ? ?Why was Eliquis? prescribed for you? ?Eliquis? was prescribed for you to reduce the risk of a blood clot forming that can cause a stroke if you have a medical condition called atrial fibrillation (a type of irregular heartbeat). ? ?What do You need to  know about Eliquis? ? ?Take your Eliquis? TWICE DAILY - one tablet in the morning and one tablet in the evening with or without food. If you have difficulty swallowing the tablet whole please discuss with your pharmacist how to take the medication safely. ? ?Take Eliquis? exactly as prescribed by your doctor and DO NOT stop taking Eliquis? without talking to the doctor who prescribed the medication.  Stopping may increase your risk of developing a stroke.  Refill your prescription before you run out. ? ?After discharge, you should have regular check-up appointments with your healthcare provider that is prescribing your Eliquis?.  In the future your dose may need to be changed if your kidney function or weight changes by a significant amount or as you get older. ? ?What do you do if you miss a dose? ?If you miss a dose, take it as soon as you remember on the same day and resume taking twice daily.  Do not take more than one dose of ELIQUIS at the same time to make up a missed dose. ? ?Important Safety Information ?A possible side effect of Eliquis? is bleeding. You should call your healthcare provider right away if you experience any of the  following: ?Bleeding from an injury or your nose that does not stop. ?Unusual colored urine (red or dark brown) or unusual colored stools (red or black). ?Unusual bruising for unknown reasons. ?A serious fall or if you hit your head (even if there is no bleeding). ? ?Some medicines may interact with Eliquis? and might increase your risk of bleeding or clotting while on Eliquis?Marland Kitchen To help avoid this, consult your healthcare provider or pharmacist prior to using any new prescription or non-prescription medications, including herbals, vitamins, non-steroidal anti-inflammatory drugs (NSAIDs) and supplements. ? ?This website has more information on Eliquis? (apixaban): http://www.eliquis.com/eliquis/home  ? ?

## 2021-06-22 DIAGNOSIS — I63511 Cerebral infarction due to unspecified occlusion or stenosis of right middle cerebral artery: Secondary | ICD-10-CM

## 2021-06-22 LAB — CBC WITH DIFFERENTIAL/PLATELET
Abs Immature Granulocytes: 0.14 10*3/uL — ABNORMAL HIGH (ref 0.00–0.07)
Basophils Absolute: 0.1 10*3/uL (ref 0.0–0.1)
Basophils Relative: 1 %
Eosinophils Absolute: 0.2 10*3/uL (ref 0.0–0.5)
Eosinophils Relative: 2 %
HCT: 36.2 % (ref 36.0–46.0)
Hemoglobin: 12.1 g/dL (ref 12.0–15.0)
Immature Granulocytes: 1 %
Lymphocytes Relative: 20 %
Lymphs Abs: 2.1 10*3/uL (ref 0.7–4.0)
MCH: 29.4 pg (ref 26.0–34.0)
MCHC: 33.4 g/dL (ref 30.0–36.0)
MCV: 87.9 fL (ref 80.0–100.0)
Monocytes Absolute: 1 10*3/uL (ref 0.1–1.0)
Monocytes Relative: 9 %
Neutro Abs: 7 10*3/uL (ref 1.7–7.7)
Neutrophils Relative %: 67 %
Platelets: 253 10*3/uL (ref 150–400)
RBC: 4.12 MIL/uL (ref 3.87–5.11)
RDW: 13.3 % (ref 11.5–15.5)
WBC: 10.5 10*3/uL (ref 4.0–10.5)
nRBC: 0 % (ref 0.0–0.2)

## 2021-06-22 LAB — GLUCOSE, CAPILLARY
Glucose-Capillary: 117 mg/dL — ABNORMAL HIGH (ref 70–99)
Glucose-Capillary: 132 mg/dL — ABNORMAL HIGH (ref 70–99)
Glucose-Capillary: 167 mg/dL — ABNORMAL HIGH (ref 70–99)
Glucose-Capillary: 132 mg/dL — ABNORMAL HIGH (ref 70–99)

## 2021-06-22 LAB — COMPREHENSIVE METABOLIC PANEL
ALT: 11 U/L (ref 0–44)
AST: 16 U/L (ref 15–41)
Albumin: 3.4 g/dL — ABNORMAL LOW (ref 3.5–5.0)
Alkaline Phosphatase: 72 U/L (ref 38–126)
Anion gap: 9 (ref 5–15)
BUN: 34 mg/dL — ABNORMAL HIGH (ref 8–23)
CO2: 23 mmol/L (ref 22–32)
Calcium: 9 mg/dL (ref 8.9–10.3)
Chloride: 107 mmol/L (ref 98–111)
Creatinine, Ser: 1.58 mg/dL — ABNORMAL HIGH (ref 0.44–1.00)
GFR, Estimated: 31 mL/min — ABNORMAL LOW (ref >60)
Glucose, Bld: 111 mg/dL — ABNORMAL HIGH (ref 70–99)
Potassium: 4.4 mmol/L (ref 3.5–5.1)
Sodium: 139 mmol/L (ref 135–145)
Total Bilirubin: 1 mg/dL (ref 0.3–1.2)
Total Protein: 7.2 g/dL (ref 6.5–8.1)

## 2021-06-22 MED ORDER — SODIUM CHLORIDE 0.9 % IV SOLN: INTRAVENOUS | Status: DC

## 2021-06-22 NOTE — Progress Notes (Signed)
?                                                       PROGRESS NOTE ? ? ?Subjective/Complaints: ?No new complaints this morning ?Denies pain, insomnia, constipation ?Was icing left eye at home 15 minutes three times per day ? ?ROS: denies pain ? ?Objective: ?  ?No results found. ?Recent Labs  ?  06/21/21 ?1629 06/22/21 ?8185  ?WBC 13.7* 10.5  ?HGB 13.0 12.1  ?HCT 39.5 36.2  ?PLT 269 253  ? ?Recent Labs  ?  06/21/21 ?0226 06/22/21 ?6314  ?NA 137 139  ?K 4.3 4.4  ?CL 105 107  ?CO2 24 23  ?GLUCOSE 102* 111*  ?BUN 28* 34*  ?CREATININE 1.36* 1.58*  ?CALCIUM 8.9 9.0  ? ? ?Intake/Output Summary (Last 24 hours) at 06/22/2021 1037 ?Last data filed at 06/22/2021 0700 ?Gross per 24 hour  ?Intake 360 ml  ?Output --  ?Net 360 ml  ?  ? ?  ? ?Physical Exam: ?Vital Signs ?Blood pressure 119/62, pulse (!) 56, temperature 97.8 ?F (36.6 ?C), temperature source Oral, resp. rate 17, height '5\' 9"'$  (1.753 m), weight 75.9 kg, SpO2 97 %. ?Constitutional:   ?   Comments: Noted to have hematoma left eyebrow with layering ecchymosis infraorbital area.  ?Eyes:  ?   Conjunctiva/sclera:  ?   Left eye: Hemorrhage present.  ?Cardiovascular:  ?   Rate and Rhythm: Rhythm irregular. Bradycardic ?Musculoskeletal:  ?   Left lower leg: Edema (1+ ankle edema) present.  ?   Comments: Nonspecific discomfort left wrist.  ?Neurological:  ?   Mental Status: She is alert and oriented to person, place, and time.  ?   Comments: Minimal dysarthria.  Left neglect noted with confrontation testing  ?Follows 2 step commands without difficulty.  Mild LUE> LLE weakness.  ? cerebellar no dysmetria on finger to nose to finger  ?General: No acute distress ?Mood and affect are appropriate ?Heart: Regular rate and rhythm no rubs murmurs or extra sounds ?Lungs: Clear to auscultation, breathing unlabored, no rales or wheezes ?Abdomen: Positive bowel sounds, soft nontender to palpation, nondistended ? ? ?Assessment/Plan: ?1. Functional deficits which require 3+ hours per day of  interdisciplinary therapy in a comprehensive inpatient rehab setting. ?Physiatrist is providing close team supervision and 24 hour management of active medical problems listed below. ?Physiatrist and rehab team continue to assess barriers to discharge/monitor patient progress toward functional and medical goals ? ?Care Tool: ? ?Bathing ?   ?Body parts bathed by patient: Right arm, Left arm, Chest, Abdomen, Front perineal area, Buttocks, Right upper leg, Left lower leg, Face, Right lower leg, Left upper leg  ?   ?  ?  ?Bathing assist Assist Level: Supervision/Verbal cueing ?  ?  ?Upper Body Dressing/Undressing ?Upper body dressing   ?What is the patient wearing?: [place] only ?   ?Upper body assist Assist Level: Supervision/Verbal cueing ?   ?Lower Body Dressing/Undressing ?Lower body dressing ? ? ?   ?What is the patient wearing?: Incontinence brief, Pants ? ?  ? ?Lower body assist Assist for lower body dressing: Contact Guard/Touching assist ?   ? ?Toileting ?Toileting    ?Toileting assist Assist for toileting: Contact Guard/Touching assist ?  ?  ?Transfers ?Chair/bed transfer ? ?Transfers assist ?   ? ?Chair/bed transfer assist level:  Contact Guard/Touching assist ?  ?  ?Locomotion ?Ambulation ? ? ?Ambulation assist ? ?   ? ?  ?  ?   ? ?Walk 10 feet activity ? ? ?Assist ?   ? ?  ?   ? ?Walk 50 feet activity ? ? ?Assist   ? ?  ?   ? ? ?Walk 150 feet activity ? ? ?Assist   ? ?  ?  ?  ? ?Walk 10 feet on uneven surface  ?activity ? ? ?Assist   ? ? ?  ?   ? ?Wheelchair ? ? ? ? ?Assist   ?  ?  ? ?  ?   ? ? ?Wheelchair 50 feet with 2 turns activity ? ? ? ?Assist ? ?  ?  ? ? ?   ? ?Wheelchair 150 feet activity  ? ? ? ?Assist ?   ? ? ?   ? ?Blood pressure 119/62, pulse (!) 56, temperature 97.8 ?F (36.6 ?C), temperature source Oral, resp. rate 17, height '5\' 9"'$  (1.753 m), weight 75.9 kg, SpO2 97 %. ? ? ? Medical Problem List and Plan: ?1. Functional deficits secondary to Right parietal, corona radiata and basal ganglia  cardioembolic infarct  ?            -patient may  shower ?            -ELOS/Goals: 5-7d Sup 24/7 ?2.  Antithrombotics: ?-DVT/anticoagulation:  Pharmaceutical: Other (comment)--renal dose Eliquis ?            -antiplatelet therapy: N/A due to recurrent GIB.  ?3. Pain Management: Tylenol prn.  ?4. Mood: LCSW to follow for evaluation and support.  ?            -antipsychotic agents: N/A ?5. Neuropsych: This patient is capable of making decisions on her own behalf. ?6. Skin/Wound Care: Routine pressure relief measures.  ?7. Fluids/Electrolytes/Nutrition: Monitor I/O. Check CMET in am.  ?8. CAD: Monitor for symptoms with increase in activity.  Monitor daily wts.  ?            --Continue Lasix MWF, metoprolol and pravachol. ?            --Imdur d/c on 05/05 due to soft BP ?9. A fib with RVR: Monitor HR TID. Rate controlled on Cardizem and metoprolol ?            --Eliquis added on 05/04 ?10. H/o GIB: Last episode 9/22-->hemoclip per Dr. Lorenso Courier. Continue Protonix BID.  ?            --Now on Eliquis--renal dose due to stroke ?            --monitor for signs of bleeding. Monitor stool guaiacs daily ?            --monitor H/H on Mon/Thus.  ?11. T2DM: Hgb A1C-6.3 and well controlled.  ?            --Continue glucotrol and metformin @ 500 mg bid.   ? -Recommended avoiding foods with added sugar ?12. CKD:  Baseline SCr-1.5-1.6 range but improved to 1.3. Encourage fluid intake.  ?            --GFR around 30--monitor with serial checks due to metformin on board. ?13. Left eye swelling: placed order to ice 15 minutes three times per day.  ?14. Hypoalbuminemia: recommended choosing foods low in added sugar and high in protein ? ?LOS: ?1 days ?A FACE TO FACE EVALUATION WAS PERFORMED ? ?Martha Clan  P Trenten Watchman ?06/22/2021, 10:37 AM  ? ?  ?

## 2021-06-22 NOTE — Progress Notes (Signed)
Inpatient Rehabilitation  Patient information reviewed and entered into eRehab system by Jedediah Noda M. Brendy Ficek, M.A., CCC/SLP, PPS Coordinator.  Information including medical coding, functional ability and quality indicators will be reviewed and updated through discharge.    

## 2021-06-22 NOTE — Plan of Care (Signed)
?  Problem: RH Balance ?Goal: LTG Patient will maintain dynamic standing with ADLs (OT) ?Description: LTG:  Patient will maintain dynamic standing balance with assist during activities of daily living (OT)  ?Flowsheets (Taken 06/22/2021 0941) ?LTG: Pt will maintain dynamic standing balance during ADLs with: Independent with assistive device ?  ?Problem: Sit to Stand ?Goal: LTG:  Patient will perform sit to stand in prep for activites of daily living with assistance level (OT) ?Description: LTG:  Patient will perform sit to stand in prep for activites of daily living with assistance level (OT) ?Flowsheets (Taken 06/22/2021 0941) ?LTG: PT will perform sit to stand in prep for activites of daily living with assistance level: Independent with assistive device ?  ?Problem: RH Grooming ?Goal: LTG Patient will perform grooming w/assist,cues/equip (OT) ?Description: LTG: Patient will perform grooming with assist, with/without cues using equipment (OT) ?Flowsheets (Taken 06/22/2021 0941) ?LTG: Pt will perform grooming with assistance level of: Independent ?  ?Problem: RH Bathing ?Goal: LTG Patient will bathe all body parts with assist levels (OT) ?Description: LTG: Patient will bathe all body parts with assist levels (OT) ?Flowsheets (Taken 06/22/2021 0941) ?LTG: Pt will perform bathing with assistance level/cueing: Independent with assistive device  ?  ?Problem: RH Dressing ?Goal: LTG Patient will perform upper body dressing (OT) ?Description: LTG Patient will perform upper body dressing with assist, with/without cues (OT). ?Flowsheets (Taken 06/22/2021 0941) ?LTG: Pt will perform upper body dressing with assistance level of: Independent ?Goal: LTG Patient will perform lower body dressing w/assist (OT) ?Description: LTG: Patient will perform lower body dressing with assist, with/without cues in positioning using equipment (OT) ?Flowsheets (Taken 06/22/2021 0941) ?LTG: Pt will perform lower body dressing with assistance level of:  Independent with assistive device ?  ?Problem: RH Toileting ?Goal: LTG Patient will perform toileting task (3/3 steps) with assistance level (OT) ?Description: LTG: Patient will perform toileting task (3/3 steps) with assistance level (OT)  ?Flowsheets (Taken 06/22/2021 0941) ?LTG: Pt will perform toileting task (3/3 steps) with assistance level: Independent with assistive device ?  ?Problem: RH Simple Meal Prep ?Goal: LTG Patient will perform simple meal prep w/assist (OT) ?Description: LTG: Patient will perform simple meal prep with assistance, with/without cues (OT). ?Flowsheets (Taken 06/22/2021 0941) ?LTG: Pt will perform simple meal prep with assistance level of: Supervision/Verbal cueing ?  ?Problem: RH Toilet Transfers ?Goal: LTG Patient will perform toilet transfers w/assist (OT) ?Description: LTG: Patient will perform toilet transfers with assist, with/without cues using equipment (OT) ?Flowsheets (Taken 06/22/2021 0941) ?LTG: Pt will perform toilet transfers with assistance level of: Independent with assistive device ?  ?Problem: RH Tub/Shower Transfers ?Goal: LTG Patient will perform tub/shower transfers w/assist (OT) ?Description: LTG: Patient will perform tub/shower transfers with assist, with/without cues using equipment (OT) ?Flowsheets (Taken 06/22/2021 0941) ?LTG: Pt will perform tub/shower stall transfers with assistance level of: Supervision/Verbal cueing ?LTG: Pt will perform tub/shower transfers from: Tub/shower combination ?  ?

## 2021-06-22 NOTE — Progress Notes (Signed)
Inpatient Rehabilitation Care Coordinator ?Assessment and Plan ?Patient Details  ?Name: Patricia English ?MRN: [id number] ?Date of Birth: 1927-06-04 ? ?Today's Date: 06/22/2021 ? ?Hospital Problems: Principal Problem: ?  Acute ischemic right MCA stroke ([place]) ? ?Past Medical History:  ?Past Medical History:  ?Diagnosis Date  ? Coronary artery disease   ? Diabetes mellitus without complication ([place])   ? GI bleeding 11/2017  ? Hypertension   ? Internal hemorrhoids   ? ?Past Surgical History:  ?Past Surgical History:  ?Procedure Laterality Date  ? BIOPSY  10/30/2020  ? Procedure: BIOPSY;  Surgeon: Sharyn Creamer, MD;  Location: [place];  Service: Gastroenterology;;  ? BIOPSY  10/31/2020  ? Procedure: BIOPSY;  Surgeon: Sharyn Creamer, MD;  Location: [facility] ENDOSCOPY;  Service: Gastroenterology;;  ? CATARACT EXTRACTION    ? COLONOSCOPY WITH PROPOFOL N/A 10/31/2020  ? Procedure: COLONOSCOPY WITH PROPOFOL;  Surgeon: Sharyn Creamer, MD;  Location: [place];  Service: Gastroenterology;  Laterality: N/A;  ? ESOPHAGOGASTRODUODENOSCOPY (EGD) WITH PROPOFOL N/A 08/13/2017  ? Procedure: ESOPHAGOGASTRODUODENOSCOPY (EGD) WITH PROPOFOL;  Surgeon: [name], MD;  Location: [facility] ENDOSCOPY;  Service: Endoscopy;  Laterality: N/A;  ? ESOPHAGOGASTRODUODENOSCOPY (EGD) WITH PROPOFOL N/A 10/30/2020  ? Procedure: ESOPHAGOGASTRODUODENOSCOPY (EGD) WITH PROPOFOL;  Surgeon: Sharyn Creamer, MD;  Location: [place];  Service: Gastroenterology;  Laterality: N/A;  ? FLEXIBLE SIGMOIDOSCOPY N/A 12/10/2017  ? Procedure: FLEXIBLE SIGMOIDOSCOPY;  Surgeon: [name], MD;  Location: [facility] ENDOSCOPY;  Service: Endoscopy;  Laterality: N/A;  ? HEMORRHOID BANDING  12/10/2017  ? Procedure: HEMORRHOID BANDING;  Surgeon: [name], MD;  Location: [facility] ENDOSCOPY;  Service: Endoscopy;;  ? HEMOSTASIS CLIP PLACEMENT  10/31/2020  ? Procedure: HEMOSTASIS CLIP PLACEMENT;  Surgeon: Sharyn Creamer, MD;  Location: [place];  Service: Gastroenterology;;  ?  POLYPECTOMY  10/31/2020  ? Procedure: POLYPECTOMY;  Surgeon: Sharyn Creamer, MD;  Location: [place];  Service: Gastroenterology;;  ? ?Social History:  reports that she has never smoked. She has never used smokeless tobacco. She reports that she does not drink alcohol and does not use drugs. ? ?Family / Support Systems ?Marital Status: Widow/Widower ?Patient Roles: Parent ?ChildrenDan Humphreys [phone]  Dorinda-daughter [phone] ?Other Supports: Another daughter in [place] ?Anticipated Caregiver: Son and daughter ?Ability/Limitations of Caregiver: Daughter recovering from colon cancer and son is retired and tinkers on cars ?Caregiver Availability: 24/7 ?Family Dynamics: Close knit family who are there for one another and will assist Mom if needed. Pt has always been independent and taken care of herself and others ? ?Social History ?Preferred language: English ?Religion: Methodist ?Cultural Background: No issues ?Education: Some schooling ?Health Literacy - How often do you need to have someone help you when you read instructions, pamphlets, or other written material from your doctor or pharmacy?: Never ?Writes: Yes ?Employment Status: Retired ?Legal History/Current Legal Issues: No issues ?Guardian/Conservator: None-according to MD pt is capable of making her own decisions while here  ? ?Abuse/Neglect ?Abuse/Neglect Assessment Can Be Completed: Yes ?Physical Abuse: Denies ?Verbal Abuse: Denies ?Sexual Abuse: Denies ?Exploitation of patient/patient's resources: Denies ?Self-Neglect: Denies ? ?Patient response to: ?Social Isolation - How often do you feel lonely or isolated from those around you?: Never ? ?Emotional Status ?Pt's affect, behavior and adjustment status: Pt is motivated to do well and is dong well. She has regained some of her function and feels much better aobut going home soon. She will do whatever the team asks her to do to progress in therapies ?Recent Psychosocial Issues: other  health issues was  doing well before this ?Psychiatric History: No history seems to be coping appropriately and doing well she is hoping to be here a short time and go home soon ?Substance Abuse History: no issues ? ?Patient / Family Perceptions, Expectations & Goals ?Pt/Family understanding of illness & functional limitations: Pt is able to exppalin her stroke and she is making progress which encourages her. She does talk with the MD and feels she has a good understanding of her plan moving forward. ?Premorbid pt/family roles/activities: Mom, great grandparent, retiree, church member, aunt, etc ?Anticipated changes in roles/activities/participation: resume ?Pt/family expectations/goals: Pt states: " I hope to do well and and do for myself when I leave here." ? ?Community Resources ?Community Agencies: None ?Premorbid Home Care/DME Agencies: None ?Transportation available at discharge: Son and family ?Is the patient able to respond to transportation needs?: Yes ?In the past 12 months, has lack of transportation kept you from medical appointments or from getting medications?: No ?In the past 12 months, has lack of transportation kept you from meetings, work, or from getting things needed for daily living?: No ? ?Discharge Planning ?Living Arrangements: Children ?Support Systems: Children, Other relatives, Friends/neighbors, Church/faith community ?Type of Residence: Private residence ?Insurance Resources: [employer], Multimedia programmer (specify) Web designer) ?Financial Resources: Fish farm manager, Family Support ?Financial Screen Referred: No ?Living Expenses: Own ?Money Management: Patient, Family ?Does the patient have any problems obtaining your medications?: No ?Home Management: Pt and son ?Patient/Family Preliminary Plans: Return home with son who lives there with her. She has other family members who are close by and supportive. Aware will be a short length of stay due to she is doing well Will conference her tomorrow and have a target  discharge date for her ?Care Coordinator Anticipated Follow Up Needs: HH/OP ? ?Clinical Impression ?Pleasant female who looks younger than her stated age, she is very independent and does for herself. Her son lives with her and will assists if needed along with her daughter. Pt is high level and will be a short length of stay here. Will update after team conference  tomorrow. ? ?Elease Hashimoto ?06/22/2021, 12:37 PM ? ?  ?

## 2021-06-22 NOTE — Progress Notes (Signed)
Patient reported to have external hems and large soft stool that was green/black last pm some blood on wash cloth--stool guaiac not sent due to concerns of contamination with external blood. Advised nurse to obtain specimen from center and continue to document. Note that patient has had recurrent acute on chronic renal failure as well as some drop in H/H (hemoconcentration?). Will hydrate with one liter of IVF and continue to monitor H/H/CrCL--labs ordered for tomorrow.    ?

## 2021-06-22 NOTE — Progress Notes (Signed)
Physical Therapy Session Note ? ?Patient Details  ?Name: Patricia English ?MRN: [id number] ?Date of Birth: 02/26/1927 ? ?Today's Date: 06/22/2021 ?PT Individual Time: 8295-6213 ?PT Individual Time Calculation (min): 69 min  ? ?Short Term Goals: ?Week 1:  PT Short Term Goal 1 (Week 1): = LTGs due to ELOS ? ?Skilled Therapeutic Interventions/Progress Updates:  ?   ?Pt greeted seated in recliner - awake and agreeable to PT tx. Denies pain. Sit<>stand to RW with supervision. Ambulated from her room to main rehab gym, 15f, with supervision and RW, gait speed appropriate for age. Instructed in 6MWT while using RW - covering 5591fwithout seated or standing rest break. Age matched norm = 128654f ? ?Standing there-ex in // bars with CGA for balance: ?-1 min of high knees with 2lb ankle weights bilaterally ?-45 seconds of hip abduction with 2lb ankle weights bilaterally ?-45 seconds of hamstring curls with 2lb ankle weights bilaterally ?-1x15 mini-squats ? ?Repeated alternating step ups to 6inch step [id number] with seated rest break b/w sets. (completes ~11 in 1 minute) with CGA for safety.  ? ?Ambulated back to her room with supervision and RW, ~175ft19fit speed 0.48 m/s indicative of limited community ambulator and increased falls risk. Pt requesting to use bathroom once we returned to the room - continent of bladder and able to complete 3/3 toileting tasks without assist. Noted brief to be saturated from prior incontinence episode. Brief change with totalA while standing with good standing balance. ? ?[place] hand hygiene completed with supervision without motor apraxia or sequencing difficulties. Concluded session seated in recliner, chair alarm on, call bell in reach.  ? ? ? ?Therapy Documentation ?Precautions:  ?  ?General: ?  ? ? ?Therapy/Group: Individual Therapy ? ?Vasiliki Smaldone P Kaimana Neuzil ?06/22/2021, 7:45 AM  ?

## 2021-06-22 NOTE — Evaluation (Signed)
Occupational Therapy Assessment and Plan ? ?Patient Details  ?Name: Patricia English ?MRN: [id number] ?Date of Birth: 10/27/1927 ? ?OT Diagnosis: disturbance of vision and muscle weakness (generalized) ?Rehab Potential: Rehab Potential (ACUTE ONLY): Excellent ?ELOS: ~5-7 days  ? ?Today's Date: 06/22/2021 ?OT Individual Time: 9147-8295 ?OT Individual Time Calculation (min): 70 min    ? ?Hospital Problem: Principal Problem: ?  Acute ischemic right MCA stroke ([place]) ? ? ?Past Medical History:  ?Past Medical History:  ?Diagnosis Date  ? Coronary artery disease   ? Diabetes mellitus without complication ([place])   ? GI bleeding 11/2017  ? Hypertension   ? Internal hemorrhoids   ? ?Past Surgical History:  ?Past Surgical History:  ?Procedure Laterality Date  ? BIOPSY  10/30/2020  ? Procedure: BIOPSY;  Surgeon: Sharyn Creamer, MD;  Location: [place];  Service: Gastroenterology;;  ? BIOPSY  10/31/2020  ? Procedure: BIOPSY;  Surgeon: Sharyn Creamer, MD;  Location: [facility] ENDOSCOPY;  Service: Gastroenterology;;  ? CATARACT EXTRACTION    ? COLONOSCOPY WITH PROPOFOL N/A 10/31/2020  ? Procedure: COLONOSCOPY WITH PROPOFOL;  Surgeon: Sharyn Creamer, MD;  Location: [place];  Service: Gastroenterology;  Laterality: N/A;  ? ESOPHAGOGASTRODUODENOSCOPY (EGD) WITH PROPOFOL N/A 08/13/2017  ? Procedure: ESOPHAGOGASTRODUODENOSCOPY (EGD) WITH PROPOFOL;  Surgeon: [name], MD;  Location: [facility] ENDOSCOPY;  Service: Endoscopy;  Laterality: N/A;  ? ESOPHAGOGASTRODUODENOSCOPY (EGD) WITH PROPOFOL N/A 10/30/2020  ? Procedure: ESOPHAGOGASTRODUODENOSCOPY (EGD) WITH PROPOFOL;  Surgeon: Sharyn Creamer, MD;  Location: [place];  Service: Gastroenterology;  Laterality: N/A;  ? FLEXIBLE SIGMOIDOSCOPY N/A 12/10/2017  ? Procedure: FLEXIBLE SIGMOIDOSCOPY;  Surgeon: [name], MD;  Location: [facility] ENDOSCOPY;  Service: Endoscopy;  Laterality: N/A;  ? HEMORRHOID BANDING  12/10/2017  ? Procedure: HEMORRHOID BANDING;  Surgeon: [name], MD;  Location:  [facility] ENDOSCOPY;  Service: Endoscopy;;  ? HEMOSTASIS CLIP PLACEMENT  10/31/2020  ? Procedure: HEMOSTASIS CLIP PLACEMENT;  Surgeon: Sharyn Creamer, MD;  Location: [place];  Service: Gastroenterology;;  ? POLYPECTOMY  10/31/2020  ? Procedure: POLYPECTOMY;  Surgeon: Sharyn Creamer, MD;  Location: [place];  Service: Gastroenterology;;  ? ? ?Assessment & Plan ?Clinical Impression: Patient is a 88 y.o. year old female RH-female with history of T2DM, HTN, CAD, GIB, AF (no AC due to GIB), recent fall 06/13/21--seen in ED w/neg work up who was admitted on 06/16/21 after awakening with left facial droop, slurred speech and left sided weakness.  UDS negative. MRI brain showed acute infarct in right basal ganglia, right insula and right parietal lobe with associated edema and no significant mass effect.  MRI brain showed signal loss right MCA bifurcation without stenosis and felt to have missing branch M2 inferior division.  CTA not done due to CKD.  2D echo showed EF 55 to 65% with trivial pericardial effusion without tamponade, moderate TVR, AV sclerosis and moderate-grade 3 atheroma plaque involving ascending aorta.  Dr. Erlinda Hong felt the stroke was likely secondary to A-fib and Eliquis initiated after discussion with PCP and family.  She was noted to have A-fib with RVR as well as SOB and diltiazem and metoprolol resumed.  Imdur was discontinued due to low blood pressures.  She continues to be limited by left-sided weakness with unsteady gait as well as fatigue.   Patient transferred to CIR on 06/21/2021 .   ? ?Patient currently requires min with basic self-care skills and functional mobility with and without AD  secondary to muscle weakness, decreased cardiorespiratoy endurance, left side neglect, and  decreased standing balance and decreased balance strategies.  Prior to hospitalization, patient could complete ADL with modified independent . ? ?Patient will benefit from skilled intervention to decrease level of assist with  basic self-care skills and increase independence with basic self-care skills prior to discharge home with care partner.  Anticipate patient will require intermittent supervision and follow up home health. ? ?OT - End of Session ?Activity Tolerance: Tolerates 30+ min activity with multiple rests ?Endurance Deficit: Yes ?OT Assessment ?Rehab Potential (ACUTE ONLY): Excellent ?OT Patient demonstrates impairments in the following area(s): Balance;Safety;Endurance;Motor ?OT Basic ADL's Functional Problem(s): Grooming;Bathing;Dressing;Toileting ?OT Advanced ADL's Functional Problem(s): Simple Meal Preparation ?OT Transfers Functional Problem(s): Toilet;Tub/Shower ?OT Additional Impairment(s): None ?OT Plan ?OT Intensity: Minimum of 1-2 x/day, 45 to 90 minutes ?OT Frequency: 5 out of 7 days ?OT Duration/Estimated Length of Stay: ~5-7 days ?OT Treatment/Interventions: Balance/vestibular training;Discharge planning;Pain management;Self Care/advanced ADL retraining;Therapeutic Activities;Functional mobility training;Patient/family education;Therapeutic Exercise;Visual/perceptual remediation/compensation;Community reintegration;DME/adaptive equipment instruction;Neuromuscular re-education;UE/LE Strength taining/ROM ?OT Self Feeding Anticipated Outcome(s): n/a ?OT Basic Self-Care Anticipated Outcome(s): mod I ?OT Toileting Anticipated Outcome(s): mod I ?OT Bathroom Transfers Anticipated Outcome(s): mod I ?OT Recommendation ?Patient destination: Home ?Follow Up Recommendations: Home health OT ?Equipment Recommended: To be determined ? ? ?OT Evaluation ?Precautions/Restrictions  ?Precautions ?Precautions: Fall ?Restrictions ?Weight Bearing Restrictions: No ?General ?Chart Reviewed: Yes ?Family/Caregiver Present: No ? ? ?Pain ? No c/o pain in session  ?Home Living/Prior Functioning ?Home Living ?Family/patient expects to be discharged to:: Private residence ?Living Arrangements: Children ?Available Help at Discharge: Family,  Available 24 hours/day ?Type of Home: House ?Home Access: Stairs to enter ?Entrance Stairs-Number of Steps: 3-4 ?Entrance Stairs-Rails: Right ?Home Layout: One level ?Bathroom Shower/Tub: Tub/shower unit ?Bathroom Toilet: Standard ?Bathroom Accessibility: Yes ?Additional Comments: son, Jeneen Rinks., lives with patient ? Lives With: Son ?IADL History ?Homemaking Responsibilities: Yes ?Meal Prep Responsibility: Primary ?Cleaning Responsibility: Secondary ?Vision ?Baseline Vision/History: 1 Wears glasses ?Ability to See in Adequate Light: 0 Adequate ?Patient Visual Report: No change from baseline ?Vision Assessment?: No apparent visual deficits ?Additional Comments: swollen above left eye from fall, bloodshot left eye ?Perception  ?Perception: Impaired ?Inattention/Neglect: Does not attend to left visual field (slightly but able to correct) ?Praxis ?Praxis: Intact ?Cognition ?Cognition ?Overall Cognitive Status: Within Functional Limits for tasks assessed ?Arousal/Alertness: Awake/alert ?Orientation Level: Person;Place;Situation ?Person: Oriented ?Place: Oriented ?Situation: Oriented ?Attention: Focused;Selective ?Focused Attention: Appears intact ?Selective Attention: Appears intact ?Awareness: Appears intact ?Safety/Judgment: Appears intact ?Brief Interview for Mental Status (BIMS) ?Repetition of Three Words (First Attempt): 3 ?Temporal Orientation: Year: Correct ?Temporal Orientation: Month: Accurate within 5 days ?Temporal Orientation: Day: Correct ?Recall: "Sock": No, could not recall ?Recall: "Blue": Yes, no cue required ?Recall: "Bed": No, could not recall ?BIMS Summary Score: 11 ?Sensation ?Sensation ?Light Touch: Appears Intact ?Hot/Cold: Appears Intact ?Proprioception: Appears Intact ?Coordination ?Gross Motor Movements are Fluid and Coordinated: Yes ?Fine Motor Movements are Fluid and Coordinated: Yes ?Motor  ?Motor ?Motor - Skilled Clinical Observations: generalized weakness  ?Trunk/Postural Assessment  ?Cervical  Assessment ?Cervical Assessment: Within Functional Limits ?Thoracic Assessment ?Thoracic Assessment: Within Functional Limits ?Lumbar Assessment ?Lumbar Assessment: Within Functional Limits ?Postural Contro

## 2021-06-22 NOTE — Discharge Summary (Signed)
Physician Discharge Summary  ?Patient ID: ?Patricia English ?MRN: [id number] ?DOB/AGE: [birth year] 88 y.o. ? ?Admit date: 06/16/2021 ?Discharge date: 06/21/2021 ? ?Admission Diagnoses: ?Acute right basal ganlia, insula and parietal lobe stroke ?Chronic atrial fibrillation, CHA2DS2VASc score of 6 ?HTN ?Type 2 DM ?CAD ?H/O GI bleed ? ?Discharge Diagnoses:  ?Principal Problem: ?  Cardio-embolic acute right basal, insula and Parietal lobe infarct ?Active Problems: ?Left eye brow hematoma ?Chronic Atrial fibrillation with CHA2DS2VASc score of 6 ?HTN ?Type 2 DM ?CAD ?H/O GI bleed ?CKD, IIIa ?Weakness ? ?Discharged Condition: fair ? ?Hospital Course: 88 years old female with PMH of CAD, type 2 DM, HTN, chronic atrial fibrillation, GI bleed and hemorrhoids had weakness and left sided facial droop when she woke up on day of admission. She was off anticoagulation due to recurrent GI bleed.  ?Her MRI showed acute right basal, insula and parietal lobe infarction. She was not a candidate for thrombolytic therapy. ?She was managed medically with moderate improvement in speech and facial droop. ?She was started on Eliquis with PPI therapy. She had ongoing weakness. ?She was discharged and readmitted to inpatient rehab for further therapy. ? ?Consults: cardiology and neurology ? ?Significant Diagnostic Studies: labs: Electrolytes were normal with creatinine of 1.61 mg. CBC was normal. ?Hgb A1C was 6.3 % ?Lipid panel was normal with LDL of 79 mg. ? ?EKG: Atrial fibrillation ? ?MRI brain: Acute basal ganglia, insula and parietal lobe infarct. ? ?Echocardiogram: Normal LV systolic function, mild MR and moderate TR. ? ?Treatments: anticoagulation: Eliquis. Cardiac medications, Diltiazem, metoprolol, furosemide and pravastatin. ? ?Discharge Exam: ?Blood pressure (!) 121/56, pulse 74, temperature 99.3 ?F (37.4 ?C), temperature source Oral, resp. rate 20, height '5\' 7"'$  (1.702 m), weight 75.3 kg, SpO2 100 %. ?General appearance: alert, cooperative  and appears stated age. ?Head: Normocephalic, atraumatic. Left eye brow 4 cm hematoma. Fading discoloration of periorbital area. ?Eyes: Brown eyes, pink conjunctiva, corneas clear. PERRL, EOM's intact.  ?Neck: No adenopathy, no carotid bruit, no JVD, supple, symmetrical, trachea midline and thyroid not enlarged. ?Resp: Clearing to auscultation bilaterally. ?Cardio: Irregular rate and rhythm, S1, S2 normal, II/VI systolic murmur, no click, rub or gallop. ?GI: Soft, non-tender; bowel sounds normal; no organomegaly. ?Extremities: No edema, cyanosis or clubbing. ?Skin: Warm and dry.  ?Neurologic: Alert and oriented X 3, normal strength and tone. Normal coordination and slow gait. ? ?Disposition:  ? ?Discharge Instructions   ? ? Ambulatory referral to Neurology   Complete by: As directed ?  ? Follow up with stroke clinic NP (Jessica Winton or Cecille Rubin, if both not available, consider Zachery Dauer, or [place]) at [facility] in about 4 weeks. Thanks.  ? ?  ? ?Allergies as of 06/21/2021   ? ?   Reactions  ? Lisinopril   ? This caused laryngeal edema  ? ?  ? ?  ?Medication List  ?  ? ?ASK your doctor about these medications   ? ?Cholecalciferol 25 MCG (1000 UT) tablet ?Take 1,000 Units by mouth daily. ?  ?Coenzyme Q10 100 MG Tabs ?Take 100 mg by mouth daily. ?  ?diltiazem 180 MG 24 hr capsule ?Commonly known as: DILACOR XR ?Take 1 capsule (180 mg total) by mouth daily. ?  ?ferrous sulfate 325 (65 FE) MG tablet ?Take 325 mg by mouth daily with breakfast. ?  ?furosemide 40 MG tablet ?Commonly known as: LASIX ?Take 1 tablet (40 mg total) by mouth every Monday, Wednesday, and Friday. ?  ?glipiZIDE 5 MG 24 hr tablet ?Commonly known as: GLUCOTROL  XL ?Take 5 mg by mouth every evening. ?  ?isosorbide mononitrate 30 MG 24 hr tablet ?Commonly known as: IMDUR ?Take 30 mg by mouth every evening. ?  ?metFORMIN 500 MG tablet ?Commonly known as: GLUCOPHAGE ?Take 500 mg by mouth 2 (two) times daily with a meal. ?  ?metoprolol tartrate 50  MG tablet ?Commonly known as: LOPRESSOR ?Take 25 mg by mouth every evening. ?  ?ondansetron 4 MG tablet ?Commonly known as: ZOFRAN ?Take 1 tablet (4 mg total) by mouth every 6 (six) hours. ?  ?pantoprazole 40 MG tablet ?Commonly known as: PROTONIX ?Take 1 tablet (40 mg total) by mouth 2 (two) times daily. ?  ?pravastatin 40 MG tablet ?Commonly known as: PRAVACHOL ?Take 40 mg by mouth daily. ?  ? ?  ? ? Follow-up Information   ? ? [facility]. Schedule an appointment as soon as possible for a visit in 1 month(s).   ?Specialty: Neurology ?Why: stroke clinic ?Contact information: ?[address] [place] ?[phone] ? ?  ?  ? ?  ?  ? ?  ? ? ?Time spent: Review of old chart, current chart, lab, x-ray, cardiac tests and discussion with patient over 60 minutes. ? ?Signed: ?Birdie Riddle ?06/22/2021, 8:32 AM ? ? ?

## 2021-06-22 NOTE — Evaluation (Signed)
Physical Therapy Assessment and Plan ? ?Patient Details  ?Name: Patricia English ?MRN: [id number] ?Date of Birth: 1927/08/26 ? ?PT Diagnosis: Difficulty walking and Muscle weakness ?Rehab Potential: Excellent ?ELOS: 3-5 days  ? ?Today's Date: 06/22/2021 ?PT Individual Time: 3267-1245 ?PT Individual Time Calculation (min): 62 min   ? ?Hospital Problem: Principal Problem: ?  Acute ischemic right MCA stroke ([place]) ? ? ?Past Medical History:  ?Past Medical History:  ?Diagnosis Date  ? Coronary artery disease   ? Diabetes mellitus without complication ([place])   ? GI bleeding 11/2017  ? Hypertension   ? Internal hemorrhoids   ? ?Past Surgical History:  ?Past Surgical History:  ?Procedure Laterality Date  ? BIOPSY  10/30/2020  ? Procedure: BIOPSY;  Surgeon: Sharyn Creamer, MD;  Location: [place];  Service: Gastroenterology;;  ? BIOPSY  10/31/2020  ? Procedure: BIOPSY;  Surgeon: Sharyn Creamer, MD;  Location: [facility] ENDOSCOPY;  Service: Gastroenterology;;  ? CATARACT EXTRACTION    ? COLONOSCOPY WITH PROPOFOL N/A 10/31/2020  ? Procedure: COLONOSCOPY WITH PROPOFOL;  Surgeon: Sharyn Creamer, MD;  Location: [place];  Service: Gastroenterology;  Laterality: N/A;  ? ESOPHAGOGASTRODUODENOSCOPY (EGD) WITH PROPOFOL N/A 08/13/2017  ? Procedure: ESOPHAGOGASTRODUODENOSCOPY (EGD) WITH PROPOFOL;  Surgeon: [name], MD;  Location: [facility] ENDOSCOPY;  Service: Endoscopy;  Laterality: N/A;  ? ESOPHAGOGASTRODUODENOSCOPY (EGD) WITH PROPOFOL N/A 10/30/2020  ? Procedure: ESOPHAGOGASTRODUODENOSCOPY (EGD) WITH PROPOFOL;  Surgeon: Sharyn Creamer, MD;  Location: [place];  Service: Gastroenterology;  Laterality: N/A;  ? FLEXIBLE SIGMOIDOSCOPY N/A 12/10/2017  ? Procedure: FLEXIBLE SIGMOIDOSCOPY;  Surgeon: [name], MD;  Location: [facility] ENDOSCOPY;  Service: Endoscopy;  Laterality: N/A;  ? HEMORRHOID BANDING  12/10/2017  ? Procedure: HEMORRHOID BANDING;  Surgeon: [name], MD;  Location: [facility] ENDOSCOPY;  Service: Endoscopy;;  ? HEMOSTASIS  CLIP PLACEMENT  10/31/2020  ? Procedure: HEMOSTASIS CLIP PLACEMENT;  Surgeon: Sharyn Creamer, MD;  Location: [place];  Service: Gastroenterology;;  ? POLYPECTOMY  10/31/2020  ? Procedure: POLYPECTOMY;  Surgeon: Sharyn Creamer, MD;  Location: [place];  Service: Gastroenterology;;  ? ? ?Assessment & Plan ?Clinical Impression: Patient is a 88 y.o. year old RH-female with history of T2DM, HTN, CAD, GIB, AF (no AC due to GIB), recent fall 06/13/21--seen in ED w/neg work up who was admitted on 06/16/21 after awakening with left facial droop, slurred speech and left sided weakness.  UDS negative. MRI brain showed acute infarct in right basal ganglia, right insula and right parietal lobe with associated edema and no significant mass effect.  MRI brain showed signal loss right MCA bifurcation without stenosis and felt to have missing branch M2 inferior division.  CTA not done due to CKD.  2D echo showed EF 55 to 65% with trivial pericardial effusion without tamponade, moderate TVR, AV sclerosis and moderate-grade 3 atheroma plaque involving ascending aorta.  Dr. Erlinda Hong felt the stroke was likely secondary to A-fib and Eliquis initiated after discussion with PCP and family.  She was noted to have A-fib with RVR as well as SOB and diltiazem and metoprolol resumed.  Imdur was discontinued due to low blood pressures.  She continues to be limited by left-sided weakness with unsteady gait as well as fatigue.  CIR was recommended due to functional decline.  Patient transferred to CIR on 06/21/2021 .  ? ?Patient currently requires min with mobility secondary to muscle weakness, decreased cardiorespiratoy endurance,  , and decreased attention to left.  Prior to hospitalization, patient was independent  with  mobility and lived with Son in a House home.  Home access is 3-4Stairs to enter. ? ?Patient will benefit from skilled PT intervention to maximize safe functional mobility, minimize fall risk, and decrease caregiver burden for  planned discharge home with 24 hour supervision.  Anticipate patient will benefit from follow up OP at discharge. ? ?PT - End of Session ?Endurance Deficit: Yes ?PT Assessment ?Rehab Potential (ACUTE/IP ONLY): Excellent ?PT Patient demonstrates impairments in the following area(s): Balance;Endurance;Perception;Safety;Motor ?PT Transfers Functional Problem(s): Bed Mobility;Bed to Chair;Car;Furniture;Floor ?PT Locomotion Functional Problem(s): Ambulation;Stairs ?PT Plan ?PT Intensity: Minimum of 1-2 x/day ,45 to 90 minutes ?PT Frequency: 5 out of 7 days ?PT Duration Estimated Length of Stay: 3-5 days ?PT Treatment/Interventions: Ambulation/gait training;Balance/vestibular training;Community reintegration;Discharge planning;Disease management/prevention;DME/adaptive equipment instruction;Functional mobility training;Neuromuscular re-education;Pain management;Patient/family education;Stair training;Therapeutic Activities;Therapeutic Exercise;UE/LE Strength taining/ROM;Visual/perceptual remediation/compensation;Wheelchair propulsion/positioning ?PT Transfers Anticipated Outcome(s): mod I ?PT Locomotion Anticipated Outcome(s): mod I household distances; supervision stairs and community ?PT Recommendation ?Follow Up Recommendations: Outpatient PT ?Patient destination: Home ?Equipment Recommended: Rolling walker with 5" wheels ? ? ?PT Evaluation ?Precautions/Restrictions ?Precautions ?Precautions: Fall ?Restrictions ?Weight Bearing Restrictions: No ?Pain ? Denies pain. ?Pain Interference ?Pain Interference ?Pain Effect on Sleep: 1. Rarely or not at all ?Pain Interference with Therapy Activities: 1. Rarely or not at all ?Pain Interference with Day-to-Day Activities: 1. Rarely or not at all ?Home Living/Prior Functioning ?Home Living ?Available Help at Discharge: Family;Available 24 hours/day ?Type of Home: House ?Home Access: Stairs to enter ?Entrance Stairs-Number of Steps: 3-4 ?Entrance Stairs-Rails: Right ?Home Layout:  One level ?Bathroom Shower/Tub: Tub/shower unit ?Bathroom Toilet: Standard ?Bathroom Accessibility: Yes ?Additional Comments: son, Jeneen Rinks., lives with patient ? Lives With: Son ?Prior Function ? Able to Take Stairs?: Yes ?Vision/Perception  ?Vision - History ?Ability to See in Adequate Light: 0 Adequate ?Vision - Assessment ?Additional Comments: swollen above left eye from fall, bloodshot left eye ?Perception ?Perception: Impaired ?Inattention/Neglect: Does not attend to left visual field (mild) ?Praxis ?Praxis: Intact  ?Cognition ?Overall Cognitive Status: Within Functional Limits for tasks assessed ?Arousal/Alertness: Awake/alert ?Attention: Focused;Selective ?Focused Attention: Appears intact ?Selective Attention: Appears intact ?Awareness: Appears intact ?Safety/Judgment: Appears intact ?Sensation ?Sensation ?Light Touch: Appears Intact ?Hot/Cold: Appears Intact ?Proprioception: Appears Intact ?Coordination ?Gross Motor Movements are Fluid and Coordinated: Yes ?Fine Motor Movements are Fluid and Coordinated: Yes ?Heel Shin Test: intact ?Motor  ?Motor ?Motor: Other (comment) ?Motor - Skilled Clinical Observations: mild L inattention; generalized weakness  ? ?Trunk/Postural Assessment  ?Cervical Assessment ?Cervical Assessment: Within Functional Limits ?Thoracic Assessment ?Thoracic Assessment: Within Functional Limits (rounded shoulders/mild kyphosis) ?Lumbar Assessment ?Lumbar Assessment: Within Functional Limits (posterior pelvic tilt) ?Postural Control ?Postural Control: Deficits on evaluation (decreased balance strategies without UE support)  ?Balance ?Balance ?Balance Assessed: Yes ?Standardized Balance Assessment ?Standardized Balance Assessment: Merrilee Jansky Balance Test;Timed Up and Go Test ?Berg Balance Test ?Sit to Stand: Able to stand  independently using hands ?Standing Unsupported: Able to stand safely 2 minutes ?Sitting with Back Unsupported but Feet Supported on Floor or Stool: Able to sit safely and  securely 2 minutes ?Stand to Sit: Sits safely with minimal use of hands ?Transfers: Able to transfer safely, definite need of hands ?Standing Unsupported with Eyes Closed: Able to stand 10 seconds with supervision

## 2021-06-22 NOTE — Progress Notes (Signed)
Inpatient Rehabilitation Center ?Individual Statement of Services ? ?Patient Name:  Patricia English  ?Date:  06/22/2021 ? ?Welcome to the [place].  Our goal is to provide you with an individualized program based on your diagnosis and situation, designed to meet your specific needs.  With this comprehensive rehabilitation program, you will be expected to participate in at least 3 hours of rehabilitation therapies Monday-Friday, with modified therapy programming on the weekends. ? ?Your rehabilitation program will include the following services:  Physical Therapy (PT), Occupational Therapy (OT), 24 hour per day rehabilitation nursing, Care Coordinator, Rehabilitation Medicine, Nutrition Services, and Pharmacy Services ? ?Weekly team conferences will be held on Wednesday to discuss your progress.  Your Inpatient Rehabilitation Care Coordinator will talk with you frequently to get your input and to update you on team discussions.  Team conferences with you and your family in attendance may also be held. ? ?Expected length of stay: 5-7 days  Overall anticipated outcome: mod/I level ? ?Depending on your progress and recovery, your program may change. Your Inpatient Rehabilitation Care Coordinator will coordinate services and will keep you informed of any changes. Your Inpatient Rehabilitation Care Coordinator's name and contact numbers are listed  below. ? ?The following services may also be recommended but are not provided by the [facility]:  ? ?Home Health Rehabiltiation Services ?Outpatient Rehabilitation Services ? ?  ?Arrangements will be made to provide these services after discharge if needed.  Arrangements include referral to agencies that provide these services. ? ?Your insurance has been verified to be:  [employer] ?Your primary doctor is:  [name] ? ?Pertinent information will be shared with your doctor and your insurance company. ? ?Inpatient Rehabilitation  Care Coordinator:  Ovidio Kin, [place] or (C) [phone] ? ?Information discussed with and copy given to patient by: Elease Hashimoto, 06/22/2021, 12:38 PM    ?

## 2021-06-22 NOTE — Progress Notes (Signed)
Patient ID: Patricia English, female   DOB: [birth year], 88 y.o.   MRN: [id number] ?Met with the patient to review rehab process, team conference and plan of care. Discussed current situation, GI bleed and effects of stroke. Reviewed secondary risk management including DM (A1C 6.3), HTN and HLD (LDL 79/Trig 138) and medications. Patient noted PTA she was mobile, independent with ADLs and medication management. Limited at present by weakness. Continue to follow along to discharge to address educational needs to facilitate preparation for discharge. Patricia English ? ?

## 2021-06-23 DIAGNOSIS — E663 Overweight: Secondary | ICD-10-CM

## 2021-06-23 DIAGNOSIS — I4811 Longstanding persistent atrial fibrillation: Secondary | ICD-10-CM

## 2021-06-23 DIAGNOSIS — E1149 Type 2 diabetes mellitus with other diabetic neurological complication: Secondary | ICD-10-CM

## 2021-06-23 LAB — BASIC METABOLIC PANEL
Anion gap: 7 (ref 5–15)
BUN: 42 mg/dL — ABNORMAL HIGH (ref 8–23)
CO2: 23 mmol/L (ref 22–32)
Calcium: 8.8 mg/dL — ABNORMAL LOW (ref 8.9–10.3)
Chloride: 109 mmol/L (ref 98–111)
Creatinine, Ser: 1.44 mg/dL — ABNORMAL HIGH (ref 0.44–1.00)
GFR, Estimated: 34 mL/min — ABNORMAL LOW (ref >60)
Glucose, Bld: 70 mg/dL (ref 70–99)
Potassium: 4.1 mmol/L (ref 3.5–5.1)
Sodium: 139 mmol/L (ref 135–145)

## 2021-06-23 LAB — GLUCOSE, CAPILLARY
Glucose-Capillary: 71 mg/dL (ref 70–99)
Glucose-Capillary: 105 mg/dL — ABNORMAL HIGH (ref 70–99)
Glucose-Capillary: 109 mg/dL — ABNORMAL HIGH (ref 70–99)
Glucose-Capillary: 221 mg/dL — ABNORMAL HIGH (ref 70–99)

## 2021-06-23 LAB — CBC
HCT: 35.4 % — ABNORMAL LOW (ref 36.0–46.0)
Hemoglobin: 11.5 g/dL — ABNORMAL LOW (ref 12.0–15.0)
MCH: 29 pg (ref 26.0–34.0)
MCHC: 32.5 g/dL (ref 30.0–36.0)
MCV: 89.2 fL (ref 80.0–100.0)
Platelets: 252 10*3/uL (ref 150–400)
RBC: 3.97 MIL/uL (ref 3.87–5.11)
RDW: 13.2 % (ref 11.5–15.5)
WBC: 9.6 10*3/uL (ref 4.0–10.5)
nRBC: 0 % (ref 0.0–0.2)

## 2021-06-23 MED ORDER — METFORMIN HCL 500 MG PO TABS
250.0000 mg | ORAL_TABLET | Freq: Two times a day (BID) | ORAL | Status: DC
  Administered 2021-06-23 – 2021-06-24 (×2): 250 mg via ORAL
  Filled 2021-06-23 (×2): qty 1

## 2021-06-23 NOTE — Discharge Summary (Signed)
Physical Therapy Discharge Summary ? ?Patient Details  ?Name: Patricia English ?MRN: [id number] ?Date of Birth: 10/24/1927 ? ?Patient has met 5 of 7 long term goals due to improved activity tolerance, improved balance, increased strength, ability to compensate for deficits, improved attention, and improved awareness.  Patient to discharge at an ambulatory level Supervision.   Patient's care partner is independent to provide the necessary physical and cognitive assistance at discharge. ? ?Reasons goals not met: Recommending supervision for gait and due to mild L inattention, narrow BOS, and increased unsteadiness with turns. Family made aware of PT/OT recommendations. ? ?Recommendation:  ?Patient will benefit from ongoing skilled PT services in home health setting to continue to advance safe functional mobility, address ongoing impairments in dynamic standing balance, L inattention, cardiovascular endurance training, gait training with LRAD, home safety, and minimize fall risk. ? ?Equipment: ?RW ? ?Reasons for discharge: treatment goals met and discharge from hospital ? ?Patient/family agrees with progress made and goals achieved: Yes ? ?PT Discharge ?Precautions/Restrictions ?Precautions ?Precautions: Fall ?Precaution Comments: mild L inattention ?Restrictions ?Weight Bearing Restrictions: No ?Pain ?Pain Assessment ?Pain Scale: 0-10 ?Pain Score: 0-No pain ?Pain Interference ?Pain Interference ?Pain Effect on Sleep: 0. Does not apply - I have not had any pain or hurting in the past 5 days ?Pain Interference with Therapy Activities: 0. Does not apply - I have not received rehabilitationtherapy in the past 5 days ?Pain Interference with Day-to-Day Activities: 1. Rarely or not at all ?Vision/Perception  ?Vision - History ?Ability to See in Adequate Light: 0 Adequate ?Vision - Assessment ?Additional Comments: Swollen above L eye from fall ?Perception ?Perception: Impaired ?Inattention/Neglect: Does not attend to left side  of body;Does not attend to left visual field (mild) ?Praxis ?Praxis: Intact  ?Cognition ?Overall Cognitive Status: Within Functional Limits for tasks assessed ?Arousal/Alertness: Awake/alert ?Orientation Level: Oriented X4 ?Attention: Focused;Selective ?Focused Attention: Appears intact ?Selective Attention: Appears intact ?Memory: Appears intact ?Awareness: Appears intact ?Problem Solving: Appears intact ?Safety/Judgment: Appears intact ?Sensation ?Sensation ?Light Touch: Appears Intact ?Hot/Cold: Appears Intact ?Proprioception: Appears Intact ?Stereognosis: Appears Intact ?Coordination ?Gross Motor Movements are Fluid and Coordinated: Yes ?Fine Motor Movements are Fluid and Coordinated: Yes ?Heel Shin Test: intact ?Motor  ?Motor ?Motor: Other (comment) ?Motor - Discharge Observations: Mild L inattentin; generalized weakness  ?Mobility ?Bed Mobility ?Bed Mobility: Rolling Left;Supine to Sit;Sit to Supine ?Rolling Left: Independent ?Supine to Sit: Independent ?Sit to Supine: Independent ?Transfers ?Transfers: Sit to Stand;Stand to [employer] Transfers ?Sit to Stand: Independent with assistive device ?Stand to Sit: Independent with assistive device ?Stand Pivot Transfers: Independent with assistive device ?Transfer (Assistive device): Rolling walker ?Locomotion  ?Gait ?Ambulation: Yes ?Gait Assistance: Supervision/Verbal cueing ?Gait Distance (Feet): 550 Feet ?Assistive device: Rolling walker ?Gait Assistance Details: Verbal cues for precautions/safety ?Gait ?Gait: Yes ?Gait Pattern: Impaired (narrow BOS, L foot internally rotated) ?Gait velocity: decreased ?Stairs / Additional Locomotion ?Stairs: Yes ?Stairs Assistance: Supervision/Verbal cueing ?Stair Management Technique: Two rails;Step to pattern ?Number of Stairs: 12 ?Height of Stairs: 6 (inches) ?Wheelchair Mobility ?Wheelchair Mobility: No  ?Trunk/Postural Assessment  ?Cervical Assessment ?Cervical Assessment: Within Functional Limits ?Thoracic  Assessment ?Thoracic Assessment: Exceptions to [facility] (rounded shoulders/kyphosis) ?Lumbar Assessment ?Lumbar Assessment: Exceptions to [facility] (posterior pelvic tilt) ?Postural Control ?Postural Control: Deficits on evaluation (decreased balance strategies without UE support)  ?Balance ?Balance ?Balance Assessed: Yes ?Standardized Balance Assessment ?Standardized Balance Assessment: Functional Gait Assessment ?Static Sitting Balance ?Static Sitting - Balance Support: Feet supported ?Static Sitting - Level of Assistance: 7: Independent ?Dynamic Sitting Balance ?Dynamic  Sitting - Balance Support: Feet supported;During functional activity ?Dynamic Sitting - Level of Assistance: 6: Modified independent (Device/Increase time) ?Static Standing Balance ?Static Standing - Balance Support: During functional activity;Bilateral upper extremity supported ?Static Standing - Level of Assistance: 6: Modified independent (Device/Increase time) ?Dynamic Standing Balance ?Dynamic Standing - Balance Support: During functional activity;Bilateral upper extremity supported ?Dynamic Standing - Level of Assistance: 5: Stand by assistance ?Functional Gait  Assessment ?Gait assessed : Yes ?Gait Level Surface: Walks 20 ft in less than 7 sec but greater than 5.5 sec, uses assistive device, slower speed, mild gait deviations, or deviates 6-10 in outside of the 12 in walkway width. ?Change in Gait Speed: Able to change speed, demonstrates mild gait deviations, deviates 6-10 in outside of the 12 in walkway width, or no gait deviations, unable to achieve a major change in velocity, or uses a change in velocity, or uses an assistive device. ?Gait with Horizontal Head Turns: Performs head turns with moderate changes in gait velocity, slows down, deviates 10-15 in outside 12 in walkway width but recovers, can continue to walk. ?Gait with Vertical Head Turns: Performs task with slight change in gait velocity (eg, minor disruption to smooth gait path),  deviates 6 - 10 in outside 12 in walkway width or uses assistive device ?Gait and Pivot Turn: Turns slowly, requires verbal cueing, or requires several small steps to catch balance following turn and stop ?Step Over Obstacle: Is able to step over one shoe box (4.5 in total height) but must slow down and adjust steps to clear box safely. May require verbal cueing. ?Gait with Narrow Base of Support: Ambulates less than 4 steps heel to toe or cannot perform without assistance. ?Gait with Eyes Closed: Walks 20 ft, uses assistive device, slower speed, mild gait deviations, deviates 6-10 in outside 12 in walkway width. Ambulates 20 ft in less than 9 sec but greater than 7 sec. ?Ambulating Backwards: Walks 20 ft, slow speed, abnormal gait pattern, evidence for imbalance, deviates 10-15 in outside 12 in walkway width. ?Steps: Two feet to a stair, must use rail. ?Total Score: 13 ?FGA comment:: no AD ?Extremity Assessment  ?  ?  ?RLE Assessment ?RLE Assessment: Within Functional Limits ?LLE Assessment ?LLE Assessment: Within Functional Limits ? ? ? ?Marisela Line P Raylan Troiani PT, DPT ?06/23/2021, 12:15 PM ?

## 2021-06-23 NOTE — Progress Notes (Signed)
Patient ID: Patricia English, female   DOB: Nov 09, 1927, 88 y.o.   MRN: [id number]  Met with pt to update regarding team conference goals of mod/I-supervision level and discharge tomorrow. Pt is fine with this and has informed her son of discharge. Have discussed equipment needs rolling walker and bedside commode and tub bench. Aware tub bench not covered will get on her own but wants other equipment. Have made referral to [employer] for equipment and will make home health referral, pt has no preference. Work toward discharge tomorrow. ?

## 2021-06-23 NOTE — Progress Notes (Signed)
Occupational Therapy Session Note ? ?Patient Details  ?Name: Patricia English ?MRN: [id number] ?Date of Birth: 08-28-1927 ? ?Today's Date: 06/23/2021 ?OT Individual Time: 709-628; [id number];  ?OT Individual Time Calculation (min): 60 min; 40 min  ? ? ?Short Term Goals: ?Week 1:  OT Short Term Goal 1 (Week 1): STG=LTG ? ?Skilled Therapeutic Interventions/Progress Updates:  ?  First session: Pt semi reclined in bed, no c/o throughout session. Requesting to shower and change clothes.  Pt completed all functional mobility and self care with supervision including supine to sit, ambulation to bathroom, toilet transfer and toileting, walk a few steps to shower, shower chair transfer, bathing UB/LB and dressing UB/LB at sit<>stand level, then ambulation to sink to brush teeth in standing, then ambulate to EOB to donn bra, shirt, brief, pants, socks and shoes.  Pt then completed blocked practice sit<>stand and turn pivots at ambulation level using RW.  Visual demonstration provided regarding RW management with cues to reduce turn radius of RW to ensure staying inside BOS. Pt reporting feeling somewhat fatigued and requesting to rest. Pt returned to recliner at end of session, BLE elevated, Call bell in reach, seat alarm on.   ? ?Second Session: Pt sitting up in recliner, no c/o pain, agreeable to ambulating to ADL suite for education for simple meal prep.  Pt completed sit to stand and ambulated 200 feet to ADL suite using RW with close supervision.  Educated pt on safe adl item setup in kitchen placing most utilized items on counter or first shelf in cabinet.  Also provided visual demonstration of safe RW use when approaching counter and refrigerator.  Pt provided excellent return demonstration approaching counter and refrigerator with supervision. Ambulated 200 feet back to room and pts family member arrived with questions regarding dc plan.  Educated regarding pts current functional status of supervision 24/7 for basic ADLs  and assist for IADLs at RW level.  Also discussed use of BSC at night for fall prevention and importance of placing at [facility] bedside.  Pt demonstrated excellent carryover of training from earlier session regarding safe RW management during turns and small space negotiation.   ? ?Therapy Documentation ?Precautions:  ?Precautions ?Precautions: Fall ?Precaution Comments: mild L inattention ?Restrictions ?Weight Bearing Restrictions: No ? ? ?Therapy/Group: Individual Therapy ? ?[name] ?06/23/2021, 12:23 PM ?

## 2021-06-23 NOTE — Progress Notes (Signed)
Physical Therapy Session Note ? ?Patient Details  ?Name: Patricia English ?MRN: [id number] ?Date of Birth: Dec 24, 1927 ? ?Today's Date: 06/23/2021 ?PT Individual Time: 5056-9794 + 8016 - 5537 ?PT Individual Time Calculation (min): 70 min + 30 min ? ?Short Term Goals: ?Week 1:  PT Short Term Goal 1 (Week 1): = LTGs due to ELOS ? ?Skilled Therapeutic Interventions/Progress Updates:  ?   ?1st session: ?Pt greeted seated in recliner - dressed and ready for therapy - no reports of pain. Discussed DC planning as pt feels ready for DC. Discussed recommendation for supervision due to mild L inattention and dynamic balance deficits. Pt reports her son lives with her and is close with other family members who can provide supervision as needed.  ? ?Sit<>stand to RW mod I from recliner. Pt requesting to use bathroom prior to leaving room due to frequent loose stools. Pt able to complete 3/3 toileting tasks without assist but she did require assist for thoroughness for posterior pericare due to loose stool. Continent of B & B - charted in flowsheets ? ?Ambulated from her room to main rehab gym, 131ft, with supervision and RW. Completed FGA balance/gait test with results outlined below. She scored 13/26 indicating increased falls risk. Scores  <22 indicate increased falls risk - discussed this with her and explained the recommendation is to use RW at all times while ambulating and pt voices understanding.  ? ?Dynamic balance training in // bars using compliant surfaces with blue airex foam pad. Eyes open CGA with UE support, minA with no UE support. Eyes closed CGA with UE support, minA with no UE support. LOB occur posteriorly and to her L. Head turns no UE support minA for balance. Limited cervical rotation in both directions, likely premorbid. Standing UE there-ex with 3lb dowel rod, 1x15 bicep curls, 1x15 chest press, 1x15 shldr press - minA required while standing on airex. ? ?Ambulated back to her room with supervision and RW,  ~171ft. Pt able to recall room # and directions to her room without assist. Concluded session seated in recliner with chair alarm on. All needs met with call bell in reach. Reviewed with her the upcoming therapy schedule.  ? ?2nd session: ?Pt sitting in recliner with daughter at bedside. Focused session on family education. Lengthy discussion with patient and daughter regarding PT goals, pt's current mobility status, DME recommendations, home safety, fall prevention, deficits related to her CVA, role of f/u therapies, etc. All questions and concerns addressed. Daughter appreciative of therapy interventions. Daughter asking about home health aids, grab bar installations - will relay to [place] for guidance. Pt requesting to use bathroom due to urinary urgency. Sit<>stand to RW mod I and ambulated to bathroom with supervision. Pt noted to be incontinent of bladder - direct handoff of care to NT with pt sitting on toilet.  ? ? ? ?Therapy Documentation ?Precautions:  ?Precautions ?Precautions: Fall ?Restrictions ?Weight Bearing Restrictions: No ?General: ?  ?   ?Balance: ?Balance ?Balance Assessed: Yes ?Standardized Balance Assessment ?Standardized Balance Assessment: Functional Gait Assessment ?Functional Gait  Assessment ?Gait assessed : Yes ?Gait Level Surface: Walks 20 ft in less than 7 sec but greater than 5.5 sec, uses assistive device, slower speed, mild gait deviations, or deviates 6-10 in outside of the 12 in walkway width. ?Change in Gait Speed: Able to change speed, demonstrates mild gait deviations, deviates 6-10 in outside of the 12 in walkway width, or no gait deviations, unable to achieve a major change in velocity, or uses a  change in velocity, or uses an assistive device. ?Gait with Horizontal Head Turns: Performs head turns with moderate changes in gait velocity, slows down, deviates 10-15 in outside 12 in walkway width but recovers, can continue to walk. ?Gait with Vertical Head Turns: Performs task with  slight change in gait velocity (eg, minor disruption to smooth gait path), deviates 6 - 10 in outside 12 in walkway width or uses assistive device ?Gait and Pivot Turn: Turns slowly, requires verbal cueing, or requires several small steps to catch balance following turn and stop ?Step Over Obstacle: Is able to step over one shoe box (4.5 in total height) but must slow down and adjust steps to clear box safely. May require verbal cueing. ?Gait with Narrow Base of Support: Ambulates less than 4 steps heel to toe or cannot perform without assistance. ?Gait with Eyes Closed: Walks 20 ft, uses assistive device, slower speed, mild gait deviations, deviates 6-10 in outside 12 in walkway width. Ambulates 20 ft in less than 9 sec but greater than 7 sec. ?Ambulating Backwards: Walks 20 ft, slow speed, abnormal gait pattern, evidence for imbalance, deviates 10-15 in outside 12 in walkway width. ?Steps: Two feet to a stair, must use rail. ?Total Score: 13 ?FGA comment:: no AD ? ?Therapy/Group: Individual Therapy ? ?Azion Centrella P Sawyer Kahan ?06/23/2021, 9:55 AM  ?

## 2021-06-23 NOTE — Progress Notes (Addendum)
?                                                       PROGRESS NOTE ? ? ?Subjective/Complaints: ?No new complaints this morning ?Had 2 BM yesterday, did not note any blood with stool. Stool brown last time, green.black on first.  ? ?ROS: denies pain, constipation ? ?Objective: ?  ?No results found. ?Recent Labs  ?  06/22/21 ?1610 06/23/21 ?9604  ?WBC 10.5 9.6  ?HGB 12.1 11.5*  ?HCT 36.2 35.4*  ?PLT 253 252  ? ?Recent Labs  ?  06/22/21 ?5409 06/23/21 ?8119  ?NA 139 139  ?K 4.4 4.1  ?CL 107 109  ?CO2 23 23  ?GLUCOSE 111* 70  ?BUN 34* 42*  ?CREATININE 1.58* 1.44*  ?CALCIUM 9.0 8.8*  ? ? ?Intake/Output Summary (Last 24 hours) at 06/23/2021 1045 ?Last data filed at 06/23/2021 0700 ?Gross per 24 hour  ?Intake 781.98 ml  ?Output 1400 ml  ?Net -618.02 ml  ?  ? ?  ? ?Physical Exam: ?Vital Signs ?Blood pressure 119/66, pulse 60, temperature 98 ?F (36.7 ?C), resp. rate 17, height '5\' 9"'$  (1.753 m), weight 78.1 kg, SpO2 99 %. ?Constitutional:   ?   Comments: Noted to have hematoma left eyebrow with layering ecchymosis infraorbital area. BMI 25.43 ?Eyes:  ?   Conjunctiva/sclera:  ?   Left eye: Hemorrhage present.  ?Cardiovascular:  ?   Rate and Rhythm: Rhythm irregular. Bradycardic ?Musculoskeletal:  ?   Left lower leg: Edema (1+ ankle edema) present.  ?   Comments: Nonspecific discomfort left wrist.  ?Neurological:  ?   Mental Status: She is alert and oriented to person, place, and time.  ?   Comments: Minimal dysarthria.  Left neglect noted with confrontation testing  ?Follows 2 step commands without difficulty.  Mild LUE> LLE weakness.  ? cerebellar no dysmetria on finger to nose to finger  ?General: No acute distress ?Mood and affect are appropriate ?Heart: Regular rate and rhythm no rubs murmurs or extra sounds ?Lungs: Clear to auscultation, breathing unlabored, no rales or wheezes ?Abdomen: Positive bowel sounds, soft nontender to palpation, nondistended ? ? ?Assessment/Plan: ?1. Functional deficits which require 3+ hours  per day of interdisciplinary therapy in a comprehensive inpatient rehab setting. ?Physiatrist is providing close team supervision and 24 hour management of active medical problems listed below. ?Physiatrist and rehab team continue to assess barriers to discharge/monitor patient progress toward functional and medical goals ? ?Care Tool: ? ?Bathing ?   ?Body parts bathed by patient: Right arm, Left arm, Chest, Abdomen, Front perineal area, Buttocks, Right upper leg, Left lower leg, Face, Right lower leg, Left upper leg  ?   ?  ?  ?Bathing assist Assist Level: Supervision/Verbal cueing ?  ?  ?Upper Body Dressing/Undressing ?Upper body dressing   ?What is the patient wearing?: [place] only ?   ?Upper body assist Assist Level: Supervision/Verbal cueing ?   ?Lower Body Dressing/Undressing ?Lower body dressing ? ? ?   ?What is the patient wearing?: Incontinence brief, Pants ? ?  ? ?Lower body assist Assist for lower body dressing: Contact Guard/Touching assist ?   ? ?Toileting ?Toileting    ?Toileting assist Assist for toileting: Contact Guard/Touching assist ?  ?  ?Transfers ?Chair/bed transfer ? ?Transfers assist ?   ? ?Chair/bed transfer  assist level: Contact Guard/Touching assist ?  ?  ?Locomotion ?Ambulation ? ? ?Ambulation assist ? ?   ? ?Assist level: Contact Guard/Touching assist ?Assistive device: No Device ?Max distance: 150'  ? ?Walk 10 feet activity ? ? ?Assist ?   ? ?Assist level: Contact Guard/Touching assist ?Assistive device: No Device  ? ?Walk 50 feet activity ? ? ?Assist   ? ?Assist level: Contact Guard/Touching assist ?Assistive device: No Device  ? ? ?Walk 150 feet activity ? ? ?Assist   ? ?Assist level: Contact Guard/Touching assist ?Assistive device: No Device ?  ? ?Walk 10 feet on uneven surface  ?activity ? ? ?Assist   ? ? ?Assist level: Minimal Assistance - Patient > 75% ?   ? ?Wheelchair ? ? ? ? ?Assist Is the patient using a wheelchair?: No ?  ?  ? ?  ?   ? ? ?Wheelchair 50 feet with 2  turns activity ? ? ? ?Assist ? ?  ?  ? ? ?   ? ?Wheelchair 150 feet activity  ? ? ? ?Assist ?   ? ? ?   ? ?Blood pressure 119/66, pulse 60, temperature 98 ?F (36.7 ?C), resp. rate 17, height '5\' 9"'$  (1.753 m), weight 78.1 kg, SpO2 99 %. ? ? ? Medical Problem List and Plan: ?1. Functional deficits secondary to Right parietal, corona radiata and basal ganglia cardioembolic infarct  ?            -patient may  shower ?            -ELOS/Goals: 5-7d Sup 24/7 ? Continue CIR ? Plan for d/c tomorrow ?2.  Antithrombotics: ?-DVT/anticoagulation:  Pharmaceutical: Other (comment)--renal dose Eliquis ?            -antiplatelet therapy: N/A due to recurrent GIB.  ?3. Pain Management: Tylenol prn.  ?4. Mood: LCSW to follow for evaluation and support.  ?            -antipsychotic agents: N/A ?5. Neuropsych: This patient is capable of making decisions on her own behalf. ?6. Skin/Wound Care: Routine pressure relief measures.  ?7. Fluids/Electrolytes/Nutrition: Monitor I/O. Check CMET in am.  ?8. CAD: Monitor for symptoms with increase in activity.  Monitor daily wts.  ?            --Continue Lasix MWF, metoprolol and pravachol. ?            --Imdur d/c on 05/05 due to soft BP ?9. A fib with RVR: Monitor HR TID. Rate controlled on Cardizem and metoprolol ?            --Eliquis added on 05/04 ?10. H/o GIB: Last episode 9/22-->hemoclip per Dr. Lorenso Courier. Continue Protonix BID.  ?            --continue on Eliquis--renal dose due to stroke ?            --monitor for signs of bleeding. Monitor stool guaiacs daily ?            --monitor H/H on Mon/Thus.  ?11. T2DM: Hgb A1C-6.3 and well controlled.  ?            --Continue glucotrol, decrease metformin to '250mg'$  BID ? -Recommended avoiding foods with added sugar ?12. CKD:  Baseline SCr-1.5-1.6 range but improved to 1.3. Encourage fluid intake.  ?            --GFR around 30--monitor with serial checks due to metformin on board. ? -decrease metformin to '250mg'$  BID ?  13. Left eye swelling: placed order  to ice 15 minutes three times per day.  ?14. Hypoalbuminemia: recommended choosing foods low in added sugar and high in protein ?15. Overweight BMI 25.43: provide dietary education.  ? ?LOS: ?2 days ?A FACE TO FACE EVALUATION WAS PERFORMED ? ?Martha Clan P Leslyn Monda ?06/23/2021, 10:45 AM  ? ?  ?

## 2021-06-23 NOTE — Patient Care Conference (Signed)
Inpatient RehabilitationTeam Conference and Plan of Care Update ?Date: 06/23/2021   Time: 11:22 AM  ? ? ?Patient Name: Patricia English      ?Medical Record Number: [id number]  ?Date of Birth: 02-23-1927 ?Sex: Female         ?Room/Bed: 3O75I/4P32R-51 ?Payor Info: Payor: MEDICARE / Plan: MEDICARE PART A AND B / Product Type: *No Product type* /   ? ?Admit Date/Time:  06/21/2021  3:50 PM ? ?Primary Diagnosis:  Acute ischemic right MCA stroke ([facility]) ? ?Hospital Problems: Principal Problem: ?  Acute ischemic right MCA stroke ([facility]) ? ? ? ?Expected Discharge Date: Expected Discharge Date: 06/24/21 ? ?Team Members Present: ?Physician leading conference: Dr. Leeroy Cha ?Social Worker Present: Ovidio Kin, LCSW ?Nurse Present: Dorien Chihuahua, RN ?PT Present: Ginnie Smart, PT ?OT Present: Leretha Pol, OT ?SLP Present: Helaine Chess, SLP ?PPS Coordinator present : Gunnar Fusi, SLP ? ?   Current Status/Progress Goal Weekly Team Focus  ?Bowel/Bladder ? ? Con/incon at times of B/B. LBM 5/8  Fewer incontinent episodes  Offer toileting Q3 while awake, q4 at hs   ?Swallow/Nutrition/ Hydration ? ?           ?ADL's ? ? setup -supervision all ADLs and functional transfers with use of RW  supervision-mod I  self care, RW management, endurance training, functional transfers.   ?Mobility ? ? supervision with RW; CGA without device  mod I with RW; supervision stairs  higher level balance, d/c planning, functional strengthening/endurance, L inattention (mild)   ?Communication ? ?           ?Safety/Cognition/ Behavioral Observations ?           ?Pain ? ? no c/o pain  Pain </=3/10  Assess qshift and prn   ?Skin ? ? Skin intact  Maintain skin integrity  Assess Qshift and prn   ? ? ?Discharge Planning:  ?Home with son who lives with pt, and is there, but outside [place]. Pt high level and doing well with therapies, short  length of stay   ?Team Discussion: ?Patient post CVA off Coag for GI bleed; with hgb drop, checking stool for hemocult.  MD adjusted metformin dose and added fluids. Monitoring edema of left eye. ? ?Patient on target to meet rehab goals: ?yes, currently needs set up for lower body care and encouragement to use adaptive equipment otherwise she furniture walks. Needs mod assist for sit - stand but able to ambulate up to 550' in 6 min walk test and maneuver up/down steps with supervision. Goals for discharge set for supervision - mod I assist. ? ?*See Care Plan and progress notes for long and short-term goals.  ? ?Revisions to Treatment Plan:  ?N/A  ?Teaching Needs: ?Safety, dietary modifications, medications, etc  ?Current Barriers to Discharge: ?Decreased caregiver support and Home enviroment access/layout ? ?Possible Resolutions to Barriers: ?Family education ?DME: RW ?OP follow up services ?  ? ? Medical Summary ?Current Status: kidney injury, type 2 diabetes with hyperglycemia, overweight, swelling of left eye, worsening anemia, dark stool ? Barriers to Discharge: Medical stability;Weight;Wound care ? Barriers to Discharge Comments: kidney injury, type 2 diabetes with hyperglycemia, overweight, swelling of left eye, worsening anemia, dark stool ?Possible Resolutions to [employer]: continue to monitor creatinine frequently and assess need for fluids, continue to monitor CBGs, decrease metformin, discussed risks and benefits of eliquis ? ? ?Continued Need for Acute Rehabilitation Level of Care: The patient requires daily medical management by a physician with specialized training in physical medicine  and rehabilitation for the following reasons: ?Direction of a multidisciplinary physical rehabilitation program to maximize functional independence : Yes ?Medical management of patient stability for increased activity during participation in an intensive rehabilitation regime.: Yes ?Analysis of laboratory values and/or radiology reports with any subsequent need for medication adjustment and/or medical intervention. : Yes ? ? ?I  attest that I was present, lead the team conference, and concur with the assessment and plan of the team. ? ? ?[name] ?06/23/2021, 4:09 PM  ? ? ? ? ? ? ?

## 2021-06-23 NOTE — Progress Notes (Signed)
Inpatient Rehabilitation Discharge Medication Review by a Pharmacist ? ?A complete drug regimen review was completed for this patient to identify any potential clinically significant medication issues. ? ?High Risk Drug Classes Is patient taking? Indication by Medication  ?Antipsychotic No   ?Anticoagulant Yes Eliquis for Afib  ?Antibiotic No   ?Opioid No   ?Antiplatelet No   ?Hypoglycemics/insulin Yes Metformin, glipizide for DM  ?Vasoactive Medication Yes Metoprolol, diltiazem for Afib, BP  ?Chemotherapy No   ?Other Yes Pravastatin for HLD ?Furosemide for fluid ?PPI for GERD  ? ? ? ?Type of Medication Issue Identified Description of Issue Recommendation(s)  ?Drug Interaction(s) (clinically significant) ?    ?Duplicate Therapy ?    ?Allergy ?    ?No Medication Administration End Date ?    ?Incorrect Dose ?    ?Additional Drug Therapy Needed ?    ?Significant med changes from prior encounter (inform family/care partners about these prior to discharge).    ?Other ?    ? ? ?Clinically significant medication issues were identified that warrant physician communication and completion of prescribed/recommended actions by midnight of the next day:  No ? ?Pharmacist comments: None ? ?Time spent performing this drug regimen review (minutes):  20 minutes ? ? ?Tad Moore ?06/23/2021 1:22 PM ?

## 2021-06-24 DIAGNOSIS — I10 Essential (primary) hypertension: Secondary | ICD-10-CM

## 2021-06-24 DIAGNOSIS — N189 Chronic kidney disease, unspecified: Secondary | ICD-10-CM

## 2021-06-24 DIAGNOSIS — D649 Anemia, unspecified: Secondary | ICD-10-CM

## 2021-06-24 LAB — GLUCOSE, CAPILLARY: Glucose-Capillary: 78 mg/dL (ref 70–99)

## 2021-06-24 LAB — CBC WITH DIFFERENTIAL/PLATELET
Abs Immature Granulocytes: 0.06 10*3/uL (ref 0.00–0.07)
Basophils Absolute: 0 10*3/uL (ref 0.0–0.1)
Basophils Relative: 0 %
Eosinophils Absolute: 0.2 10*3/uL (ref 0.0–0.5)
Eosinophils Relative: 3 %
HCT: 36.5 % (ref 36.0–46.0)
Hemoglobin: 11.9 g/dL — ABNORMAL LOW (ref 12.0–15.0)
Immature Granulocytes: 1 %
Lymphocytes Relative: 25 %
Lymphs Abs: 2.3 10*3/uL (ref 0.7–4.0)
MCH: 29.1 pg (ref 26.0–34.0)
MCHC: 32.6 g/dL (ref 30.0–36.0)
MCV: 89.2 fL (ref 80.0–100.0)
Monocytes Absolute: 1 10*3/uL (ref 0.1–1.0)
Monocytes Relative: 10 %
Neutro Abs: 5.7 10*3/uL (ref 1.7–7.7)
Neutrophils Relative %: 61 %
Platelets: 269 10*3/uL (ref 150–400)
RBC: 4.09 MIL/uL (ref 3.87–5.11)
RDW: 13.2 % (ref 11.5–15.5)
WBC: 9.3 10*3/uL (ref 4.0–10.5)
nRBC: 0 % (ref 0.0–0.2)

## 2021-06-24 MED ORDER — DILTIAZEM HCL ER 180 MG PO CP24: 180.0000 mg | ORAL_CAPSULE | Freq: Two times a day (BID) | ORAL | 0 refills | Status: DC

## 2021-06-24 MED ORDER — FUROSEMIDE 40 MG PO TABS: 40.0000 mg | ORAL_TABLET | Freq: Every day | ORAL | Status: DC

## 2021-06-24 MED ORDER — METOPROLOL TARTRATE 25 MG PO TABS: 12.5000 mg | ORAL_TABLET | Freq: Every evening | ORAL | 0 refills | Status: DC

## 2021-06-24 MED ORDER — APIXABAN 2.5 MG PO TABS: 2.5000 mg | ORAL_TABLET | Freq: Two times a day (BID) | ORAL | 0 refills | Status: DC

## 2021-06-24 MED ORDER — FUROSEMIDE 40 MG PO TABS: 40.0000 mg | ORAL_TABLET | ORAL | Status: DC

## 2021-06-24 MED ORDER — METFORMIN HCL 500 MG PO TABS
250.0000 mg | ORAL_TABLET | Freq: Two times a day (BID) | ORAL | Status: DC
Start: 2021-06-24 — End: 2021-07-18

## 2021-06-24 MED ORDER — PRAVASTATIN SODIUM 40 MG PO TABS: 80.0000 mg | ORAL_TABLET | Freq: Every day | ORAL | 1 refills | Status: DC

## 2021-06-24 NOTE — Discharge Summary (Signed)
Physician Discharge Summary  ?Patient ID: ?Patricia English ?MRN: [id number] ?DOB/AGE: 86-Sep-1929 86 y.o. ? ?Admit date: 06/21/2021 ?Discharge date: 06/24/2021 ? ?Discharge Diagnoses:  ?Principal Problem: ?  Acute ischemic right MCA stroke ([facility]) ?Active Problems: ?  DM2 (diabetes mellitus, type 2) ([facility]) ?  Essential hypertension ?  Acute on chronic renal failure ([facility]) ?  Anemia ? ? ?Discharged Condition: stable  ? ?Significant Diagnostic Studies: N/A ? ?Labs:  ?Basic Metabolic Panel: ?Recent Labs  ?Lab 06/18/21 ?0313 06/20/21 ?0140 06/21/21 ?0226 06/22/21 ?3976 06/23/21 ?7341  ?NA 141 137 137 139 139  ?K 3.8 3.9 4.3 4.4 4.1  ?CL 106 105 105 107 109  ?CO2 '26 25 24 23 23  '$ ?GLUCOSE 129* 122* 102* 111* 70  ?BUN 21 30* 28* 34* 42*  ?CREATININE 1.35* 1.61* 1.36* 1.58* 1.44*  ?CALCIUM 9.0 8.9 8.9 9.0 8.8*  ? ? ?CBC: ?Recent Labs  ?Lab 06/21/21 ?1629 06/22/21 ?9379 06/23/21 ?0240 06/24/21 ?9735  ?WBC 13.7* 10.5 9.6 9.3  ?NEUTROABS 9.7* 7.0  --  5.7  ?HGB 13.0 12.1 11.5* 11.9*  ?HCT 39.5 36.2 35.4* 36.5  ?MCV 89.6 87.9 89.2 89.2  ?PLT 269 253 252 269  ? ? ?CBG: ?Recent Labs  ?Lab 06/23/21 ?0559 06/23/21 ?1127 06/23/21 ?1631 06/23/21 ?2052 06/24/21 ?3299  ?GLUCAP 71 105* 109* 221* 78  ? ? ?Brief HPI:   Patricia English is a 88 y.o. female with history of HTN, T2DM, HTN, A fib--no AC due to GIB, recent fall with negative ED work up who was admitted on 06/16/21 after awakening with left facial droop, slurred speech and left sided weakness. MRI brain done revealing acute infarct in right basal ganglia, right insula and right parietal lobe. 2 D echo showed EF 55-65% with trivial pericardial effusion and grade 3 atheroma plaque involving ascending aorta. Dr. Erlinda Hong felt that stroke was likely secondary to A fib and Eliquis was added. She did develop A fib with RVR as well as SOB therefore metoprolol and Cardizem was resumed. Imdur was discontinued due to low BP. She continued to be limited by left sided weakness with unsteady gait and  fatigue. CIR was recommended due to functional decline.  ? ? ?Hospital Course: Patricia English was admitted to rehab 06/21/2021 for inpatient therapies to consist of PT, ST and OT at least three hours five days a week. Past admission physiatrist, therapy team and rehab RN have worked together to provide customized collaborative inpatient rehab. She was maintained on renal dose eliquis with daily H/H which is relatively stable. No signs of bleeding reported. Patient and family were educated to monitor for signs of bleeding and to follow up with PCP for any concerns. Her blood pressures were monitored on TID basis and have been well controlled. She has tolerated increase in activity without any cardiac symptoms.  ? ?Serial check of lytes showed acute on chronic renal failure and she was hydrated with one liter IVF without much improvement. Lasix was decreased from daily to home dose of MWF and patient advised to increase fluid intake. Diabetes has been monitored with ac/hs CBG checks and SSI was use prn for tighter BS control. Metformin was decreased to 250 mg bid due to acute on chronic renal failure as well as am hypoglycemia. She was encouraged to increase fluid intake at discharge. She made excellent gains and supervision is recommended for safety.   ? ? ?Rehab course: During patient's stay in rehab weekly team conferences were held to monitor patient's progress, set goals and discuss barriers  to discharge. At admission, patient required min assist with basic ADL tasks and with mobility. She  has had improvement in activity tolerance, balance, postural control as well as ability to compensate for deficits. She has had improvement in functional use LUE  and LLE as well as improvement in awareness. She is able to complete ADL tasks with supervision. She requires supervision for transfers and is able to ambulate 150' with RW.  Family was advised on assistance needed.  ? ?Discharge disposition: 01-Home or Self  Care ? ?Diet: Carb Modified/Heart Healthy.  ? ?Special Instructions: ?Recommend repeat CBC/BMET in 1-2 weeks to monitor H/H/SCr. ?No driving or strenuous activity till cleared by MD. ? ? ?Discharge Instructions   ? ? Ambulatory referral to Neurology   Complete by: As directed ?  ? An appointment is requested in approximately: 2 weeks  ? Ambulatory referral to Physical Medicine Rehab   Complete by: As directed ?  ? TC appointment  ? ?  ? ?Allergies as of 06/24/2021   ? ?   Reactions  ? Lisinopril   ? This caused laryngeal edema  ? ?  ? ?  ?Medication List  ?  ? ?STOP taking these medications   ? ?Coenzyme Q10 100 MG Tabs ?  ?isosorbide mononitrate 30 MG 24 hr tablet ?Commonly known as: IMDUR ?  ?ondansetron 4 MG tablet ?Commonly known as: ZOFRAN ?  ? ?  ? ?TAKE these medications   ? ?apixaban 2.5 MG Tabs tablet ?Commonly known as: ELIQUIS ?Take 1 tablet (2.5 mg total) by mouth 2 (two) times daily. ?  ?Cholecalciferol 25 MCG (1000 UT) tablet ?Take 1,000 Units by mouth daily. ?  ?diltiazem 180 MG 24 hr capsule ?Commonly known as: DILACOR XR ?Take 1 capsule (180 mg total) by mouth 2 (two) times daily. ?  ?ferrous sulfate 325 (65 FE) MG tablet ?Take 325 mg by mouth daily with breakfast. ?  ?furosemide 40 MG tablet ?Commonly known as: LASIX ?Take 1 tablet (40 mg total) by mouth every Monday, Wednesday, and Friday. ?Start taking on: Jun 25, 2021 ?What changed: when to take this ?  ?glipiZIDE 5 MG 24 hr tablet ?Commonly known as: GLUCOTROL XL ?Take 5 mg by mouth every evening. ?  ?metFORMIN 500 MG tablet ?Commonly known as: GLUCOPHAGE ?Take 0.5 tablets (250 mg total) by mouth 2 (two) times daily with a meal. ?What changed: how much to take ?  ?metoprolol tartrate 25 MG tablet ?Commonly known as: LOPRESSOR ?Take 0.5 tablets (12.5 mg total) by mouth every evening. ?What changed:  ?medication strength ?how much to take ?  ?pantoprazole 40 MG tablet ?Commonly known as: PROTONIX ?Take 1 tablet (40 mg total) by mouth 2 (two) times  daily. ?  ?pravastatin 40 MG tablet ?Commonly known as: PRAVACHOL ?Take 2 tablets (80 mg total) by mouth daily. ?What changed: how much to take ?  ? ?  ? ? Follow-up Information   ? ? Izora Ribas, MD Follow up.   ?Specialty: Physical Medicine and Rehabilitation ?Why: Office to call for appointment. Please feel free to call above number if you expereince any issues at home. ?Contact information: ?[address] ?[place] [address] ?[phone] ? ? ?  ?  ? ? [name], MD. Call today.   ?Specialty: Cardiology ?Why: for post hospital follow up ?Contact information: ?[address] ?[place] [address] ?[phone] ? ? ?  ?  ? ? [facility] Follow up.   ?Why: office will  call you with follow up appointment ?Contact information: ?[address]     [place] [address] ?[phone] ? ?  ?  ? ?  ?  ? ?  ? ? ?Signed: ?Ivan Anchors Caleb Decock ?06/24/2021, 5:30 PM ?  ?

## 2021-06-24 NOTE — IPOC Note (Addendum)
Overall Plan of Care (IPOC) ?Patient Details ?Name: Patricia English ?MRN: [id number] ?DOB: July 27, 1927 ? ?Admitting Diagnosis: Acute ischemic right MCA stroke ([place]) ? ?Hospital Problems: Principal Problem: ?  Acute ischemic right MCA stroke ([place]) ? ? ? ? Functional Problem List: ?Nursing Bladder, Bowel, Medication Management, Safety, Pain, Endurance  ?PT Balance, Endurance, Perception, Safety, Motor  ?OT Balance, Safety, Endurance, Motor  ?SLP    ?TR    ?    ? Basic ADL?s: ?OT Grooming, Bathing, Dressing, Toileting  ? ?  Advanced  ADL?s: ?OT Simple Meal Preparation  ?   ?Transfers: ?PT Bed Mobility, Bed to Chair, Car, Furniture, Floor  ?OT Toilet, Tub/Shower  ? ?  Locomotion: ?PT Ambulation, Stairs  ? ?  Additional Impairments: ?OT None  ?SLP   ?  ?   ?TR    ? ? ?Anticipated Outcomes ?Item Anticipated Outcome  ?Self Feeding n/a  ?Swallowing ?   ?  ?Basic self-care ? mod I  ?Toileting ? mod I ?  ?Bathroom Transfers mod I  ?Bowel/Bladder ? manage bowel w mod I and bladder w toileting assist  ?Transfers ? mod I  ?Locomotion ? mod I household distances; supervision stairs and community  ?Communication ?    ?Cognition ?    ?Pain ? maintain pain at or below level 4 with prns  ?Safety/Judgment ? maintain safety w cues  ? ?Therapy Plan: ?PT Intensity: Minimum of 1-2 x/day ,45 to 90 minutes ?PT Frequency: 5 out of 7 days ?PT Duration Estimated Length of Stay: 3-5 days ?OT Intensity: Minimum of 1-2 x/day, 45 to 90 minutes ?OT Frequency: 5 out of 7 days ?OT Duration/Estimated Length of Stay: ~5-7 days ?  ? ? Team Interventions: ?Nursing Interventions Bladder Management, Disease Management/Prevention, Medication Management, Discharge Planning, Pain Management, Bowel Management, Patient/Family Education  ?PT interventions Ambulation/gait training, Training and development officer, Community reintegration, Discharge planning, Disease management/prevention, DME/adaptive equipment instruction, Functional mobility training, Neuromuscular  re-education, Pain management, Patient/family education, Stair training, Therapeutic Activities, Therapeutic Exercise, UE/LE Strength taining/ROM, Visual/perceptual remediation/compensation, Wheelchair propulsion/positioning  ?OT Interventions Balance/vestibular training, Discharge planning, Pain management, Self Care/advanced ADL retraining, Therapeutic Activities, Functional mobility training, Patient/family education, Therapeutic Exercise, Visual/perceptual remediation/compensation, Community reintegration, DME/adaptive equipment instruction, Neuromuscular re-education, UE/LE Strength taining/ROM  ?SLP Interventions    ?TR Interventions    ?SW/CM Interventions Discharge Planning, Psychosocial Support, Patient/Family Education  ? ?Barriers to Discharge ?MD  Medical stability  ?Nursing Decreased caregiver support, Home environment access/layout ?1 level 3 ste right rail w son;  ?PT   ?   ?OT   ?   ?SLP   ?   ?SW   ?   ? ?Team Discharge Planning: ?Destination: PT-Home ,OT- Home , SLP-  ?Projected Follow-up: PT-Outpatient PT, OT-  Home health OT, SLP-  ?Projected Equipment Needs: PT-Rolling walker with 5" wheels, OT- To be determined, SLP-  ?Equipment Details: PT- , OT-  ?Patient/family involved in discharge planning: PT- Patient,  OT-Patient, SLP-  ? ?MD ELOS: 3 days ?Medical Rehab Prognosis:  Excellent ?Assessment: The patient has been admitted for CIR therapies with the diagnosis of right parietal/corona radiata/and basal ganglia infarct. The team will be addressing functional mobility, strength, stamina, balance, safety, adaptive techniques and equipment, self-care, bowel and bladder mgt, patient and caregiver education. Goals have been set at supervision. Anticipated discharge destination is home. ? ? ?   ? ? ?See Team Conference Notes for weekly updates to the plan of care  ?

## 2021-06-24 NOTE — Progress Notes (Signed)
?                                                       PROGRESS NOTE ? ? ?Subjective/Complaints: ?No new complaints this morning ?Sleepy ?Plan for d/c home today ?Hgb improved ? ?ROS: denies pain, constipation ? ?Objective: ?  ?No results found. ?Recent Labs  ?  06/23/21 ?9741 06/24/21 ?6384  ?WBC 9.6 9.3  ?HGB 11.5* 11.9*  ?HCT 35.4* 36.5  ?PLT 252 269  ? ?Recent Labs  ?  06/22/21 ?5364 06/23/21 ?6803  ?NA 139 139  ?K 4.4 4.1  ?CL 107 109  ?CO2 23 23  ?GLUCOSE 111* 70  ?BUN 34* 42*  ?CREATININE 1.58* 1.44*  ?CALCIUM 9.0 8.8*  ? ? ?Intake/Output Summary (Last 24 hours) at 06/24/2021 0935 ?Last data filed at 06/24/2021 0700 ?Gross per 24 hour  ?Intake 940 ml  ?Output --  ?Net 940 ml  ?  ? ?  ? ?Physical Exam: ?Vital Signs ?Blood pressure 126/60, pulse 68, temperature 97.9 ?F (36.6 ?C), temperature source Oral, resp. rate 18, height '5\' 9"'$  (1.753 m), weight 77.4 kg, SpO2 99 %. ?Constitutional:   ?   Comments: Noted to have hematoma left eyebrow with layering ecchymosis infraorbital area. BMI 25.43 ?Eyes:  ?   Conjunctiva/sclera:  ?   Left eye: Hemorrhage present.  ?Cardiovascular:  ?   Rate and Rhythm: Rhythm irregular. Bradycardic ?Musculoskeletal:  ?   Left lower leg: Edema (1+ ankle edema) present.  ?   Comments: Nonspecific discomfort left wrist.  ?Ambulating S with RW ?Neurological:  ?   Mental Status: She is alert and oriented to person, place, and time.  ?   Comments: Minimal dysarthria.  Left neglect noted with confrontation testing  ?Follows 2 step commands without difficulty.  Mild LUE> LLE weakness.  ? cerebellar no dysmetria on finger to nose to finger  ?General: No acute distress ?Mood and affect are appropriate ?Heart: Regular rate and rhythm no rubs murmurs or extra sounds ?Lungs: Clear to auscultation, breathing unlabored, no rales or wheezes ?Abdomen: Positive bowel sounds, soft nontender to palpation, nondistended ? ? ?Assessment/Plan: ?1. Functional deficits which require 3+ hours per day of  interdisciplinary therapy in a comprehensive inpatient rehab setting. ?Physiatrist is providing close team supervision and 24 hour management of active medical problems listed below. ?Physiatrist and rehab team continue to assess barriers to discharge/monitor patient progress toward functional and medical goals ? ?Care Tool: ? ?Bathing ?   ?Body parts bathed by patient: Right arm, Left arm, Chest, Abdomen, Front perineal area, Buttocks, Right upper leg, Left lower leg, Face, Right lower leg, Left upper leg  ?   ?  ?  ?Bathing assist Assist Level: Supervision/Verbal cueing ?  ?  ?Upper Body Dressing/Undressing ?Upper body dressing   ?What is the patient wearing?: Lindsay only ?   ?Upper body assist Assist Level: Set up assist ?   ?Lower Body Dressing/Undressing ?Lower body dressing ? ? ?   ?What is the patient wearing?: Incontinence brief, Pants ? ?  ? ?Lower body assist Assist for lower body dressing: Supervision/Verbal cueing ?   ? ?Toileting ?Toileting    ?Toileting assist Assist for toileting: Supervision/Verbal cueing ?  ?  ?Transfers ?Chair/bed transfer ? ?Transfers assist ?   ? ?Chair/bed transfer assist level: Independent with assistive device ?  Chair/bed transfer assistive device: Walker ?  ?Locomotion ?Ambulation ? ? ?Ambulation assist ? ?   ? ?Assist level: Supervision/Verbal cueing ?Assistive device: Walker-rolling ?Max distance: 150'  ? ?Walk 10 feet activity ? ? ?Assist ?   ? ?Assist level: Supervision/Verbal cueing ?Assistive device: Walker-rolling  ? ?Walk 50 feet activity ? ? ?Assist   ? ?Assist level: Supervision/Verbal cueing ?Assistive device: Walker-rolling  ? ? ?Walk 150 feet activity ? ? ?Assist   ? ?Assist level: Supervision/Verbal cueing ?Assistive device: Walker-rolling ?  ? ?Walk 10 feet on uneven surface  ?activity ? ? ?Assist   ? ? ?Assist level: Contact Guard/Touching assist ?Assistive device: Walker-rolling  ? ?Wheelchair ? ? ? ? ?Assist Is the patient using a wheelchair?: No ?  ?   ? ?  ?   ? ? ?Wheelchair 50 feet with 2 turns activity ? ? ? ?Assist ? ?  ?  ? ? ?   ? ?Wheelchair 150 feet activity  ? ? ? ?Assist ?   ? ? ?   ? ?Blood pressure 126/60, pulse 68, temperature 97.9 ?F (36.6 ?C), temperature source Oral, resp. rate 18, height '5\' 9"'$  (1.753 m), weight 77.4 kg, SpO2 99 %. ? ? ? Medical Problem List and Plan: ?1. Functional deficits secondary to Right parietal, corona radiata and basal ganglia cardioembolic infarct  ?            -patient may  shower ?            -ELOS/Goals: 5-7d Sup 24/7 ? D/c home today ?2.  Antithrombotics: ?-DVT/anticoagulation:  Pharmaceutical: Other (comment)--renal dose Eliquis ?            -antiplatelet therapy: N/A due to recurrent GIB.  ?3. Pain Management: Tylenol prn.  ?4. Mood: LCSW to follow for evaluation and support.  ?            -antipsychotic agents: N/A ?5. Neuropsych: This patient is capable of making decisions on her own behalf. ?6. Skin/Wound Care: Routine pressure relief measures.  ?7. Fluids/Electrolytes/Nutrition: Monitor I/O. Check CMET in am.  ?8. CAD: Monitor for symptoms with increase in activity.  Monitor daily wts.  ?            --Continue Lasix MWF, metoprolol and pravachol. ?            --Imdur d/c on 05/05 due to soft BP ?9. A fib with RVR: Monitor HR TID. Rate controlled on Cardizem and metoprolol ?            --Eliquis added on 05/04 ?10. H/o GIB: Last episode 9/22-->hemoclip per Dr. Lorenso Courier. Continue Protonix BID.  ?            --continue on Eliquis--renal dose due to stroke ?            --monitor for signs of bleeding. Monitor stool guaiacs daily ?            --Hgb improved to 11.9, monitor outpatient.  ?11. T2DM: Hgb A1C-6.3 and well controlled.  ?            --Continue glucotrol, decrease metformin to '250mg'$  BID ? -Recommended avoiding foods with added sugar ?12. CKD:  Baseline SCr-1.5-1.6 range but improved to 1.3. Encourage fluid intake.  ?            --GFR around 30--monitor with serial checks due to metformin on  board. ? -decrease metformin to '250mg'$  BID ? Continue to monitor BUN/Cr outpatient ?13. Left eye swelling:  placed order to ice 15 minutes three times per day.  ?14. Hypoalbuminemia: recommended choosing foods low in added sugar and high in protein ?15. Overweight BMI 25.43: provide dietary education.  ? ? >30 minutes spent in discharge of patient including review of medications and follow-up appointments, physical examination, and in answering all patient's questions  ? ?LOS: ?3 days ?A FACE TO FACE EVALUATION WAS PERFORMED ? ?Patricia English P Patricia English ?06/24/2021, 9:35 AM  ? ?  ?

## 2021-06-24 NOTE — Progress Notes (Signed)
Occupational Therapy Discharge Summary ? ?Patient Details  ?Name: Patricia English ?MRN: [id number] ?Date of Birth: 02/14/1928 ? ?Today's Date: 06/24/2021 ?   ? ? ?Patient has met 5 of 10 long term goals due to improved activity tolerance, improved balance, and ability to compensate for deficits. Pt is independent for self feeding, setup for seated UB dressing and bathing, mod I for sit<>stand transfers using RW, and supervision for standing oral hygiene and grooming, LB self care including bathing, dressing, and toileting and also simple meal prep. Patient to discharge at overall Supervision  at ambulatory level using RW.  Patient's care partner, her son, lives with her and is available 24/7 to provide supervision as needed and also has daughter who lives close by who is able to come and check on patient intermittently and both are independent to provide the necessary cognitive assistance at discharge.   ? ?Reasons goals not met: Pt is at baseline supervision per family and patient report, and has the necessary supervision at home, therefore discharged to home setting with home follow up.  Long term goals were set at independent by another therapist. ? ?Recommendation:  ?Patient will benefit from ongoing skilled OT services in home health setting to continue to advance functional skills in the area of BADL. ? ?Equipment: ?RW and recommended shower bench ? ?Reasons for discharge: discharge from hospital ? ?Patient/family agrees with progress made and goals achieved: Yes ? ?OT Discharge ?Precautions/Restrictions  ?Precautions ?Precautions: Fall ?Precaution Comments: mild L inattention ?Restrictions ?Weight Bearing Restrictions: No ?Pain ?Pain Assessment ?Pain Scale: 0-10 ?Pain Score: 0-No pain ?ADL ?ADL ?Eating: Independent ?Where Assessed-Eating: Chair ?Grooming: Supervision/safety ?Where Assessed-Grooming: Standing at sink ?Upper Body Bathing: Setup ?Where Assessed-Upper Body Bathing: Shower ?Lower Body Bathing:  Supervision/safety ?Where Assessed-Lower Body Bathing: Shower ?Upper Body Dressing: Setup ?Where Assessed-Upper Body Dressing: Edge of bed ?Lower Body Dressing: Setup ?Where Assessed-Lower Body Dressing: Edge of bed ?Toileting: Modified independent ?Where Assessed-Toileting: Toilet ?Toilet Transfer: Modified independent ?Toilet Transfer Method: Ambulating ?Science writer: Grab bars ?Tub/Shower Transfer: Close supervison ?Tub/Shower Transfer Method: Ambulating ?Walk-In Shower Transfer: Close supervision ?Walk-In Shower Transfer Method: Ambulating ?Walk-In Shower Equipment: Radio broadcast assistant ?Vision ?Baseline Vision/History: 1 Wears glasses ?Patient Visual Report: No change from baseline ?Vision Assessment?: No apparent visual deficits ?Perception  ?Perception: Impaired ?Inattention/Neglect: Does not attend to left side of body;Does not attend to left visual field ?Praxis ?Praxis: Intact ?Cognition ?Cognition ?Overall Cognitive Status: Within Functional Limits for tasks assessed ?Arousal/Alertness: Awake/alert ?Orientation Level: Person;Place;Situation ?Person: Oriented ?Place: Oriented ?Situation: Oriented ?Memory: Appears intact ?Attention: Focused;Selective ?Focused Attention: Appears intact ?Selective Attention: Appears intact ?Awareness: Appears intact ?Problem Solving: Appears intact ?Safety/Judgment: Appears intact ?Brief Interview for Mental Status (BIMS) ?Repetition of Three Words (First Attempt): 3 ?Temporal Orientation: Year: Correct ?Temporal Orientation: Month: Accurate within 5 days ?Temporal Orientation: Day: Correct ?Recall: "Sock": Yes, no cue required ?Recall: "Blue": Yes, no cue required ?Recall: "Bed": No, could not recall ?BIMS Summary Score: 13 ?Sensation ?Sensation ?Light Touch: Appears Intact ?Hot/Cold: Appears Intact ?Proprioception: Appears Intact ?Stereognosis: Appears Intact ?Coordination ?Gross Motor Movements are Fluid and Coordinated: Yes ?Fine Motor Movements are Fluid and  Coordinated: Yes ?Finger Nose Finger Test: WNL ?Motor  ?Motor ?Motor: Other (comment) ?Motor - Discharge Observations: Mild L inattentin; generalized weakness ?Mobility  ?Bed Mobility ?Bed Mobility: Rolling Left;Supine to Sit;Sit to Supine ?Rolling Left: Independent ?Supine to Sit: Independent ?Sit to Supine: Independent ?Transfers ?Sit to Stand: Independent with assistive device ?Stand to Sit: Independent with assistive device  ?Trunk/Postural Assessment  ?  Cervical Assessment ?Cervical Assessment: Within Functional Limits ?Thoracic Assessment ?Thoracic Assessment: Exceptions to [facility] (rounded shoulders) ?Lumbar Assessment ?Lumbar Assessment:  (posterior pelvic tilt) ?Postural Control ?Postural Control: Deficits on evaluation (decreased balance without RW)  ?Balance ?Balance ?Balance Assessed: Yes ?Static Sitting Balance ?Static Sitting - Balance Support: Feet supported ?Static Sitting - Level of Assistance: 7: Independent ?Dynamic Sitting Balance ?Dynamic Sitting - Balance Support: Feet supported;During functional activity ?Dynamic Sitting - Level of Assistance: 6: Modified independent (Device/Increase time) ?Static Standing Balance ?Static Standing - Balance Support: During functional activity;Bilateral upper extremity supported ?Static Standing - Level of Assistance: 6: Modified independent (Device/Increase time) ?Dynamic Standing Balance ?Dynamic Standing - Balance Support: During functional activity;Bilateral upper extremity supported ?Dynamic Standing - Level of Assistance: 5: Stand by assistance ?Extremity/Trunk Assessment ?RUE Assessment ?RUE Assessment: Within Functional Limits ?LUE Assessment ?LUE Assessment: Within Functional Limits ? ? ?Caryl Asp Moni Rothrock ?06/24/2021, 7:30 AM ?

## 2021-06-24 NOTE — Progress Notes (Signed)
INPATIENT REHABILITATION DISCHARGE NOTE ? ? ?Discharge instructions by:PAm PA ? ?Verbalized understanding:yes ? ?Skin care/Wound care healing?done ? ?Pain: ?none ?IV's:d/c ? ?Tubes/Drains:nome ? ?O2:none ? ?Safety instructions:done ? ?Patient belongings:done ? ?Discharged VO:HYWV ? ?Discharged via: ?wheelchair ?Notes: ?Family in room ?Patricia English RNC,BSN, WTA, RN3 ? ? ? ? ?  ?

## 2021-06-24 NOTE — Progress Notes (Signed)
Inpatient Rehabilitation Care Coordinator ?Discharge Note  ? ?Patient Details  ?Name: Patricia English ?MRN: [id number] ?Date of Birth: Dec 12, 1927 ? ? ?Discharge location: HOME WITH SON WHO IS RETIRED AND CAN BE THERE WITH HER ? ?Length of Stay: 3 DAYS ? ?Discharge activity level: MOD/I LEVEL ? ?Home/community participation: ACTIVE ? ?Patient response EL:YHTMBP Literacy - How often do you need to have someone help you when you read instructions, pamphlets, or other written material from your doctor or pharmacy?: Never ? ?Patient response JP:ETKKOE Isolation - How often do you feel lonely or isolated from those around you?: Never ? ?Services provided included: MD, RD, PT, OT, RN, CM, Pharmacy, SW ? ?Financial Services:  ?Charity fundraiser Utilized: Medicare ?  ? ?Choices offered to/list presented to: PT ? ?Follow-up services arranged:  ?Home Health, DME, Patient/Family has no preference for HH/DME agencies ?Home Health Agency: [facility]  ?  ?DME : [facility]  ROLLINT WALKER & 3 IN 1 WILL GET TUB BENCH ON HER OWN ?  ? ?Patient response to transportation need: ?Is the patient able to respond to transportation needs?: Yes ?In the past 12 months, has lack of transportation kept you from medical appointments or from getting medications?: No ?In the past 12 months, has lack of transportation kept you from meetings, work, or from getting things needed for daily living?: No ? ? ? ?Comments (or additional information):PT WAS HIGH LEVEL WHEN SHE WAS ADMITTED. REACHED GOALS AND READY TO GO HOME WITH SON. NO FAMILY ED NEEDED DUE TO HIGH LEVEL ? ?Patient/Family verbalized understanding of follow-up arrangements:  Yes ? ?Individual responsible for coordination of the follow-up plan: JAMES-SON [phone] ? ?Confirmed correct DME delivered: Elease Hashimoto 06/24/2021   ? ?Elease Hashimoto ?

## 2021-07-01 ENCOUNTER — Telehealth: Payer: Self-pay | Admitting: *Deleted

## 2021-07-01 NOTE — Telephone Encounter (Signed)
Mrs Hobday daughter called about her mother's medication schedule. Per her AVS from hospital discharge we reviewed medications and times they should be taken.

## 2021-07-06 ENCOUNTER — Encounter
Payer: Medicare Other | Attending: Physical Medicine and Rehabilitation | Admitting: Physical Medicine and Rehabilitation

## 2021-07-06 VITALS — BP 150/81 | HR 75 | Ht 69.0 in | Wt 169.0 lb

## 2021-07-06 DIAGNOSIS — R051 Acute cough: Secondary | ICD-10-CM | POA: Diagnosis present

## 2021-07-06 DIAGNOSIS — I63511 Cerebral infarction due to unspecified occlusion or stenosis of right middle cerebral artery: Secondary | ICD-10-CM | POA: Diagnosis present

## 2021-07-06 DIAGNOSIS — I480 Paroxysmal atrial fibrillation: Secondary | ICD-10-CM | POA: Insufficient documentation

## 2021-07-06 DIAGNOSIS — I5020 Unspecified systolic (congestive) heart failure: Secondary | ICD-10-CM | POA: Diagnosis present

## 2021-07-06 MED ORDER — PANTOPRAZOLE SODIUM 40 MG PO TBEC: 40.0000 mg | DELAYED_RELEASE_TABLET | Freq: Two times a day (BID) | ORAL | 3 refills | Status: DC

## 2021-07-06 MED ORDER — APIXABAN 2.5 MG PO TABS: 2.5000 mg | ORAL_TABLET | Freq: Two times a day (BID) | ORAL | 3 refills | Status: DC

## 2021-07-06 MED ORDER — FUROSEMIDE 40 MG PO TABS: 40.0000 mg | ORAL_TABLET | ORAL | 3 refills | Status: DC

## 2021-07-06 MED ORDER — PRAVASTATIN SODIUM 40 MG PO TABS: 80.0000 mg | ORAL_TABLET | Freq: Every day | ORAL | 3 refills | Status: DC

## 2021-07-06 MED ORDER — METOPROLOL TARTRATE 25 MG PO TABS: 12.5000 mg | ORAL_TABLET | Freq: Every evening | ORAL | 3 refills | Status: DC

## 2021-07-06 MED ORDER — BENZONATATE 100 MG PO CAPS: 200.0000 mg | ORAL_CAPSULE | Freq: Three times a day (TID) | ORAL | 0 refills | Status: DC | PRN

## 2021-07-06 MED ORDER — GLIPIZIDE ER 5 MG PO TB24: 5.0000 mg | ORAL_TABLET | Freq: Every evening | ORAL | 3 refills | Status: DC

## 2021-07-06 MED ORDER — DILTIAZEM HCL ER 180 MG PO CP24: 180.0000 mg | ORAL_CAPSULE | Freq: Two times a day (BID) | ORAL | 0 refills | Status: DC

## 2021-07-06 NOTE — Progress Notes (Signed)
Subjective:    Patient ID: Patricia English, female    DOB: 1927-08-11, 88 y.o.   MRN: [id number]  HPI Patricia English is a 88 year old woman who presents for transitional care follow-up after CIR admission for MCA CVA.  No concerns at home.  She is staying by herself.  Son stays with her She is going well with her mobility.  Doing outpatient therapy.  Needs refills today. She is not aware for how long she should be taking Eliquis. She used to take warfarin. Has history of GI bleed.  BP 150/81 today and HR is 75 Her daughter accompanies her and she is a recent survivor of colon cancer. She had been supporting her daughter and now her daughter wants to support her.   Pain Inventory Average Pain 0 Pain Right Now 0 My pain is  no pain  LOCATION OF PAIN  no pain  BOWEL Number of stools per week: 6 Oral laxative use No  Type of laxative . Enema or suppository use No  History of colostomy No  Incontinent No   BLADDER Pads In and out cath, frequency . Able to self cath  . Bladder incontinence No  Frequent urination No  Leakage with coughing No  Difficulty starting stream No  Incomplete bladder emptying No    Mobility use a walker ability to climb steps?  yes do you drive?  no  Function retired  Neuro/Psych trouble walking  Prior Studies Hospital F/U  Physicians involved in your care Hospital F/U   Family History  Problem Relation Age of Onset   Diabetes Mother    Heart Problems Mother    Social History   Socioeconomic History   Marital status: Widowed    Spouse name: Not on file   Number of children: Not on file   Years of education: Not on file   Highest education level: Not on file  Occupational History   Not on file  Tobacco Use   Smoking status: Never   Smokeless tobacco: Never  Vaping Use   Vaping Use: Never used  Substance and Sexual Activity   Alcohol use: No   Drug use: Never   Sexual activity: Not on file  Other Topics Concern    Not on file  Social History Narrative   Patient is widowed   Denies alcohol tobacco use   Lives with adult son   Social Determinants of Health   Financial Resource Strain: Not on file  Food Insecurity: Not on file  Transportation Needs: Not on file  Physical Activity: Not on file  Stress: Not on file  Social Connections: Not on file   Past Surgical History:  Procedure Laterality Date   BIOPSY  10/30/2020   Procedure: BIOPSY;  Surgeon: Sharyn Creamer, MD;  Location: [place];  Service: Gastroenterology;;   BIOPSY  10/31/2020   Procedure: BIOPSY;  Surgeon: Sharyn Creamer, MD;  Location: [place];  Service: Gastroenterology;;   CATARACT EXTRACTION     COLONOSCOPY WITH PROPOFOL N/A 10/31/2020   Procedure: COLONOSCOPY WITH PROPOFOL;  Surgeon: Sharyn Creamer, MD;  Location: [place];  Service: Gastroenterology;  Laterality: N/A;   ESOPHAGOGASTRODUODENOSCOPY (EGD) WITH PROPOFOL N/A 08/13/2017   Procedure: ESOPHAGOGASTRODUODENOSCOPY (EGD) WITH PROPOFOL;  Surgeon: [name], MD;  Location: [place];  Service: Endoscopy;  Laterality: N/A;   ESOPHAGOGASTRODUODENOSCOPY (EGD) WITH PROPOFOL N/A 10/30/2020   Procedure: ESOPHAGOGASTRODUODENOSCOPY (EGD) WITH PROPOFOL;  Surgeon: Sharyn Creamer, MD;  Location: [place];  Service: Gastroenterology;  Laterality: N/A;   FLEXIBLE SIGMOIDOSCOPY N/A 12/10/2017   Procedure: FLEXIBLE SIGMOIDOSCOPY;  Surgeon: [name], MD;  Location: [facility] ENDOSCOPY;  Service: Endoscopy;  Laterality: N/A;   HEMORRHOID BANDING  12/10/2017   Procedure: HEMORRHOID BANDING;  Surgeon: [name], MD;  Location: [facility] ENDOSCOPY;  Service: Endoscopy;;   HEMOSTASIS CLIP PLACEMENT  10/31/2020   Procedure: HEMOSTASIS CLIP PLACEMENT;  Surgeon: Sharyn Creamer, MD;  Location[facility] ENDOSCOPY;  Service: Gastroenterology;;   POLYPECTOMY  10/31/2020   Procedure: POLYPECTOMY;  Surgeon: Sharyn Creamer, MD;  Location: [facility] ENDOSCOPY;  Service: Gastroenterology;;   Past  Medical History:  Diagnosis Date   Coronary artery disease    Diabetes mellitus without complication ([place])    GI bleeding 11/2017   Hypertension    Internal hemorrhoids    BP (!) 150/81   Pulse 75   Ht '5\' 9"'$  (1.753 m)   Wt 169 lb (76.7 kg)   SpO2 98%   BMI 24.96 kg/m   Opioid Risk Score:   Fall Risk Score:  `1  Depression screen [facility] 2/9     07/06/2021   11:11 AM  Depression screen PHQ 2/9  Decreased Interest 0  Down, Depressed, Hopeless 0  PHQ - 2 Score 0  Altered sleeping 0  Tired, decreased energy 0  Change in appetite 0  Feeling bad or failure about yourself  0  Trouble concentrating 0  Moving slowly or fidgety/restless 0  Suicidal thoughts 0  PHQ-9 Score 0  Difficult doing work/chores Not difficult at all      Review of Systems  Musculoskeletal:  Positive for gait problem.  All other systems reviewed and are negative.     Objective:   Physical Exam Gen: no distress, normal appearing HEENT: oral mucosa pink and moist, NCAT Cardio: Reg rate Chest: normal effort, normal rate of breathing Abd: soft, non-distended Ext: no edema Psych: pleasant, normal affect Skin: intact Neuro: Alert and oriented x3 MSK: ambulating with RW     Assessment & Plan:  1) CVA -continue therapies -would benefit from handicap placard to increase mobility in the community -reviewed all medications and provided necessary refills.  2) Atrial fibrillation -refilled Eliquis and discussed that she will likely be on this for life. -continue Diltiazem  3) CHF -refilled Lasix three times per week  4) Cough -prescribed tessalon pearles -recommended drinking hone, lime, and ginger at home  Current impairments: Impaired mobility and ADLs requiring RW.   Patient will require home OT and PT; orders have been placed  The patient's medical and/or psychosocial problems require moderate decision-making during transitions in care from inpatient rehabilitation to home. This  transitional care appointment included review of the patient's hospital discharge summary, review of the patient's hospital diagnostic tests and discussion of appropriate follow-up, education of the patient regarding their condition, re-establishment of necessary referrals. I will be reviewing patient's home and/or outpatient therapy notes as they progress through therapy and corresponding with therapists accordingly. I have encouraged compliance with current medication regimen (with adjustment to regimen as needed), follow-up with necessary providers, and the importance of following a healthy diet and exercise routine to maximize recovery, health, and quality of life.

## 2021-07-13 ENCOUNTER — Telehealth: Payer: Self-pay

## 2021-07-13 MED ORDER — PRAVASTATIN SODIUM 40 MG PO TABS: 40.0000 mg | ORAL_TABLET | Freq: Every day | ORAL | 3 refills | Status: AC

## 2021-07-13 NOTE — Addendum Note (Signed)
Addended by: Casilda Carls on: 07/13/2021 10:36 AM   Modules accepted: Orders

## 2021-07-13 NOTE — Telephone Encounter (Signed)
Pharmacy sent a fax stating the insurance will only cover 1 tablet of Pravastatin 40 mg.

## 2021-07-16 ENCOUNTER — Inpatient Hospital Stay (HOSPITAL_COMMUNITY)
Admission: EM | Admit: 2021-07-16 | Discharge: 2021-07-18 | DRG: 378 | Disposition: A | Discharge status: Home / Self Care | Payer: Medicare Other | Attending: Cardiology | Admitting: Cardiology

## 2021-07-16 DIAGNOSIS — J40 Bronchitis, not specified as acute or chronic: Secondary | ICD-10-CM | POA: Diagnosis present

## 2021-07-16 DIAGNOSIS — I129 Hypertensive chronic kidney disease with stage 1 through stage 4 chronic kidney disease, or unspecified chronic kidney disease: Secondary | ICD-10-CM | POA: Diagnosis present

## 2021-07-16 DIAGNOSIS — Z7901 Long term (current) use of anticoagulants: Secondary | ICD-10-CM

## 2021-07-16 DIAGNOSIS — Z833 Family history of diabetes mellitus: Secondary | ICD-10-CM

## 2021-07-16 DIAGNOSIS — Z8673 Personal history of transient ischemic attack (TIA), and cerebral infarction without residual deficits: Secondary | ICD-10-CM

## 2021-07-16 DIAGNOSIS — Z79899 Other long term (current) drug therapy: Secondary | ICD-10-CM | POA: Diagnosis not present

## 2021-07-16 DIAGNOSIS — I482 Chronic atrial fibrillation, unspecified: Secondary | ICD-10-CM | POA: Diagnosis present

## 2021-07-16 DIAGNOSIS — I2510 Atherosclerotic heart disease of native coronary artery without angina pectoris: Secondary | ICD-10-CM | POA: Diagnosis present

## 2021-07-16 DIAGNOSIS — Z888 Allergy status to other drugs, medicaments and biological substances status: Secondary | ICD-10-CM | POA: Diagnosis not present

## 2021-07-16 DIAGNOSIS — N183 Chronic kidney disease, stage 3 unspecified: Secondary | ICD-10-CM | POA: Diagnosis present

## 2021-07-16 DIAGNOSIS — K648 Other hemorrhoids: Secondary | ICD-10-CM | POA: Diagnosis present

## 2021-07-16 DIAGNOSIS — K922 Gastrointestinal hemorrhage, unspecified: Secondary | ICD-10-CM | POA: Diagnosis present

## 2021-07-16 DIAGNOSIS — Z7984 Long term (current) use of oral hypoglycemic drugs: Secondary | ICD-10-CM

## 2021-07-16 DIAGNOSIS — K625 Hemorrhage of anus and rectum: Principal | ICD-10-CM

## 2021-07-16 DIAGNOSIS — E1122 Type 2 diabetes mellitus with diabetic chronic kidney disease: Secondary | ICD-10-CM | POA: Diagnosis present

## 2021-07-16 DIAGNOSIS — Z8249 Family history of ischemic heart disease and other diseases of the circulatory system: Secondary | ICD-10-CM

## 2021-07-16 DIAGNOSIS — K5731 Diverticulosis of large intestine without perforation or abscess with bleeding: Secondary | ICD-10-CM | POA: Diagnosis present

## 2021-07-16 LAB — CBC WITH DIFFERENTIAL/PLATELET
Abs Immature Granulocytes: 0.07 10*3/uL (ref 0.00–0.07)
Basophils Absolute: 0 10*3/uL (ref 0.0–0.1)
Basophils Relative: 0 %
Eosinophils Absolute: 0.1 10*3/uL (ref 0.0–0.5)
Eosinophils Relative: 1 %
HCT: 33.3 % — ABNORMAL LOW (ref 36.0–46.0)
Hemoglobin: 10.3 g/dL — ABNORMAL LOW (ref 12.0–15.0)
Immature Granulocytes: 1 %
Lymphocytes Relative: 11 %
Lymphs Abs: 1.2 10*3/uL (ref 0.7–4.0)
MCH: 29.3 pg (ref 26.0–34.0)
MCHC: 30.9 g/dL (ref 30.0–36.0)
MCV: 94.9 fL (ref 80.0–100.0)
Monocytes Absolute: 0.8 10*3/uL (ref 0.1–1.0)
Monocytes Relative: 7 %
Neutro Abs: 8.5 10*3/uL — ABNORMAL HIGH (ref 1.7–7.7)
Neutrophils Relative %: 80 %
Platelets: 250 10*3/uL (ref 150–400)
RBC: 3.51 MIL/uL — ABNORMAL LOW (ref 3.87–5.11)
RDW: 13.2 % (ref 11.5–15.5)
WBC: 10.7 10*3/uL — ABNORMAL HIGH (ref 4.0–10.5)
nRBC: 0 % (ref 0.0–0.2)

## 2021-07-16 LAB — COMPREHENSIVE METABOLIC PANEL
ALT: 10 U/L (ref 0–44)
AST: 15 U/L (ref 15–41)
Albumin: 3.4 g/dL — ABNORMAL LOW (ref 3.5–5.0)
Alkaline Phosphatase: 61 U/L (ref 38–126)
Anion gap: 11 (ref 5–15)
BUN: 20 mg/dL (ref 8–23)
CO2: 18 mmol/L — ABNORMAL LOW (ref 22–32)
Calcium: 8.9 mg/dL (ref 8.9–10.3)
Chloride: 108 mmol/L (ref 98–111)
Creatinine, Ser: 1.66 mg/dL — ABNORMAL HIGH (ref 0.44–1.00)
GFR, Estimated: 29 mL/min — ABNORMAL LOW (ref >60)
Glucose, Bld: 217 mg/dL — ABNORMAL HIGH (ref 70–99)
Potassium: 4.1 mmol/L (ref 3.5–5.1)
Sodium: 137 mmol/L (ref 135–145)
Total Bilirubin: 0.7 mg/dL (ref 0.3–1.2)
Total Protein: 6.8 g/dL (ref 6.5–8.1)

## 2021-07-16 LAB — TYPE AND SCREEN
ABO/RH(D): O POS
Antibody Screen: NEGATIVE

## 2021-07-16 LAB — HEMOGLOBIN A1C
Hgb A1c MFr Bld: 6.3 % — ABNORMAL HIGH (ref 4.8–5.6)
Mean Plasma Glucose: 134.11 mg/dL

## 2021-07-16 LAB — POC OCCULT BLOOD, ED: Fecal Occult Bld: POSITIVE — AB

## 2021-07-16 LAB — PROTIME-INR
INR: 1.2 (ref 0.8–1.2)
Prothrombin Time: 15.4 seconds — ABNORMAL HIGH (ref 11.4–15.2)

## 2021-07-16 LAB — GLUCOSE, CAPILLARY
Glucose-Capillary: 140 mg/dL — ABNORMAL HIGH (ref 70–99)
Glucose-Capillary: 228 mg/dL — ABNORMAL HIGH (ref 70–99)

## 2021-07-16 MED ORDER — PANTOPRAZOLE SODIUM 40 MG PO TBEC
40.0000 mg | DELAYED_RELEASE_TABLET | Freq: Two times a day (BID) | ORAL | Status: DC
  Administered 2021-07-16 – 2021-07-18 (×4): 40 mg via ORAL
  Filled 2021-07-16 (×4): qty 1

## 2021-07-16 MED ORDER — INSULIN ASPART 100 UNIT/ML IJ SOLN
0.0000 [IU] | Freq: Three times a day (TID) | INTRAMUSCULAR | Status: DC
  Administered 2021-07-17 – 2021-07-18 (×4): 1 [IU] via SUBCUTANEOUS

## 2021-07-16 MED ORDER — PRAVASTATIN SODIUM 40 MG PO TABS: 40.0000 mg | ORAL_TABLET | Freq: Every day | ORAL | Status: DC

## 2021-07-16 MED ORDER — POLYETHYLENE GLYCOL 3350 17 G PO PACK: 17.0000 g | PACK | Freq: Every day | ORAL | Status: DC | PRN

## 2021-07-16 MED ORDER — METOPROLOL TARTRATE 12.5 MG HALF TABLET
12.5000 mg | ORAL_TABLET | Freq: Every evening | ORAL | Status: DC
  Administered 2021-07-16 – 2021-07-17 (×2): 12.5 mg via ORAL
  Filled 2021-07-16 (×2): qty 1

## 2021-07-16 MED ORDER — BENZONATATE 100 MG PO CAPS
200.0000 mg | ORAL_CAPSULE | Freq: Three times a day (TID) | ORAL | Status: DC | PRN
Start: 2021-07-16 — End: 2021-07-18
  Administered 2021-07-16 – 2021-07-17 (×3): 200 mg via ORAL
  Filled 2021-07-16 (×3): qty 2

## 2021-07-16 MED ORDER — DOCUSATE SODIUM 100 MG PO CAPS: 100.0000 mg | ORAL_CAPSULE | Freq: Two times a day (BID) | ORAL | Status: DC | PRN

## 2021-07-16 MED ORDER — AMOXICILLIN-POT CLAVULANATE 500-125 MG PO TABS
1.0000 | ORAL_TABLET | Freq: Two times a day (BID) | ORAL | Status: DC
  Administered 2021-07-16 – 2021-07-18 (×5): 500 mg via ORAL
  Filled 2021-07-16 (×7): qty 1

## 2021-07-16 MED ORDER — PRAVASTATIN SODIUM 40 MG PO TABS
40.0000 mg | ORAL_TABLET | Freq: Every day | ORAL | Status: DC
  Administered 2021-07-17 – 2021-07-18 (×2): 40 mg via ORAL
  Filled 2021-07-16 (×2): qty 1

## 2021-07-16 MED ORDER — VITAMIN D 25 MCG (1000 UNIT) PO TABS
1000.0000 [IU] | ORAL_TABLET | Freq: Every day | ORAL | Status: DC
  Administered 2021-07-17 – 2021-07-18 (×2): 1000 [IU] via ORAL
  Filled 2021-07-16 (×2): qty 1

## 2021-07-16 MED ORDER — FERROUS SULFATE 325 (65 FE) MG PO TABS
325.0000 mg | ORAL_TABLET | Freq: Every day | ORAL | Status: DC
  Administered 2021-07-17 – 2021-07-18 (×2): 325 mg via ORAL
  Filled 2021-07-16 (×2): qty 1

## 2021-07-16 MED ORDER — AMOXICILLIN-POT CLAVULANATE 875-125 MG PO TABS: 1.0000 | ORAL_TABLET | Freq: Two times a day (BID) | ORAL | Status: DC

## 2021-07-16 MED ORDER — SODIUM CHLORIDE 0.9 % IV SOLN
INTRAVENOUS | Status: DC
  Administered 2021-07-16 – 2021-07-17 (×2): 1000 mL via INTRAVENOUS

## 2021-07-16 NOTE — ED Provider Notes (Addendum)
[facility] EMERGENCY DEPARTMENT Provider Note   CSN: [id number] Arrival date & time: 07/16/21  1051     History  Chief Complaint  Patient presents with   Rectal Bleeding    Patricia English is a 88 y.o. female.  Patient presents to the emergency department today for evaluation of rectal bleeding.  Patient was admitted in the past month with MCA territory stroke.  She is on Eliquis.  He states that over the past week she has been noticing a small amount of blood in her stool, daily.  She was told by her primary care doctor, Dr. Doylene Canard to hold the Eliquis several days ago.  The last time she took her blood thinner was on 07/12/21.  This morning the patient had a couple of bowel movements with larger amounts of red blood.  She had some lightheadedness and nausea with this.  No full syncope.  She denies chest pain or shortness of breath.  This worsening bleeding prompted emergency department visit.  Denies abdominal pain.  Patient had a colonoscopy in September 2022 demonstrating diverticulosis, several polyps, rectal ulcers, rectal prolapse, nonbleeding internal hemorrhoids.      Home Medications Prior to Admission medications   Medication Sig Start Date End Date Taking? Authorizing Provider  apixaban (ELIQUIS) 2.5 MG TABS tablet Take 1 tablet (2.5 mg total) by mouth 2 (two) times daily. 07/06/21   Raulkar, Clide Deutscher, MD  benzonatate (TESSALON PERLES) 100 MG capsule Take 2 capsules (200 mg total) by mouth 3 (three) times daily as needed for cough. 07/06/21   Raulkar, Clide Deutscher, MD  Cholecalciferol 25 MCG (1000 UT) tablet Take 1,000 Units by mouth daily.    [provider]  diltiazem (DILACOR XR) 180 MG 24 hr capsule Take 1 capsule (180 mg total) by mouth 2 (two) times daily. 07/06/21   Raulkar, Clide Deutscher, MD  ferrous sulfate 325 (65 FE) MG tablet Take 325 mg by mouth daily with breakfast.    [provider]  furosemide (LASIX) 40 MG tablet Take 1 tablet  (40 mg total) by mouth every Monday, Wednesday, and Friday. 07/07/21   Raulkar, Clide Deutscher, MD  glipiZIDE (GLUCOTROL XL) 5 MG 24 hr tablet Take 1 tablet (5 mg total) by mouth every evening. 07/06/21   Raulkar, Clide Deutscher, MD  metFORMIN (GLUCOPHAGE) 500 MG tablet Take 0.5 tablets (250 mg total) by mouth 2 (two) times daily with a meal. 06/24/21   Love, Ivan Anchors, PA-C  metoprolol tartrate (LOPRESSOR) 25 MG tablet Take 0.5 tablets (12.5 mg total) by mouth every evening. 07/06/21   Raulkar, Clide Deutscher, MD  pantoprazole (PROTONIX) 40 MG tablet Take 1 tablet (40 mg total) by mouth 2 (two) times daily. 07/06/21   Raulkar, Clide Deutscher, MD  pravastatin (PRAVACHOL) 40 MG tablet Take 1 tablet (40 mg total) by mouth daily. 07/13/21   Raulkar, Clide Deutscher, MD      Allergies    Lisinopril    Review of Systems   Review of Systems  Physical Exam Updated Vital Signs BP 100/65 (BP Location: Right Arm)   Pulse 73   Temp 97.6 F (36.4 C) (Oral)   Resp 15   SpO2 97%  Physical Exam Vitals and nursing note reviewed.  Constitutional:      General: She is not in acute distress.    Appearance: She is well-developed.  HENT:     Head: Normocephalic and atraumatic.     Right Ear: External ear normal.  Left Ear: External ear normal.     Nose: Nose normal.  Eyes:     Conjunctiva/sclera: Conjunctivae normal.  Cardiovascular:     Rate and Rhythm: Normal rate and regular rhythm.     Heart sounds: No murmur heard. Pulmonary:     Effort: No respiratory distress.     Breath sounds: No wheezing, rhonchi or rales.  Abdominal:     Palpations: Abdomen is soft.     Tenderness: There is no abdominal tenderness. There is no guarding or rebound.  Genitourinary:    Rectum: Guaiac result positive. External hemorrhoid present.     Comments: No tenderness with digital rectal exam.  Maroon-colored blood coated gloved finger, but no large-volume bleeding appreciated.  Hemorrhoids noted.  Musculoskeletal:     Cervical back: Normal  range of motion and neck supple.     Right lower leg: No edema.     Left lower leg: No edema.  Skin:    General: Skin is warm and dry.     Findings: No rash.  Neurological:     General: No focal deficit present.     Mental Status: She is alert. Mental status is at baseline.     Motor: No weakness.  Psychiatric:        Mood and Affect: Mood normal.    ED Results / Procedures / Treatments   Labs (all labs ordered are listed, but only abnormal results are displayed) Labs Reviewed  COMPREHENSIVE METABOLIC PANEL - Abnormal; Notable for the following components:      Result Value   CO2 18 (*)    Glucose, Bld 217 (*)    Creatinine, Ser 1.66 (*)    Albumin 3.4 (*)    GFR, Estimated 29 (*)    All other components within normal limits  CBC WITH DIFFERENTIAL/PLATELET - Abnormal; Notable for the following components:   WBC 10.7 (*)    RBC 3.51 (*)    Hemoglobin 10.3 (*)    HCT 33.3 (*)    Neutro Abs 8.5 (*)    All other components within normal limits  PROTIME-INR - Abnormal; Notable for the following components:   Prothrombin Time 15.4 (*)    All other components within normal limits  POC OCCULT BLOOD, ED - Abnormal; Notable for the following components:   Fecal Occult Bld POSITIVE (*)    All other components within normal limits  TYPE AND SCREEN    EKG EKG Interpretation  Date/Time:  Friday July 16 2021 11:19:06 EDT Ventricular Rate:  78 PR Interval:    QRS Duration: 83 QT Interval:  397 QTC Calculation: 453 R Axis:   -11 Text Interpretation: Atrial fibrillation Low voltage, extremity and precordial leads Confirmed by Pattricia Boss [phone]) on 07/16/2021 12:51:45 PM  Radiology No results found.  Procedures Procedures    Medications Ordered in ED Medications - No data to display  ED Course/ Medical Decision Making/ A&P    Patient seen and examined. History obtained directly from patient.  I also reviewed recent hospitalization records from CVA admission.    Labs/EKG: Ordered CBC, CMP, INR, type and screen.  Imaging: None ordered  Medications/Fluids: None ordered.  Vital signs are stable.  She does not require emergent blood transfusion.  Most recent vital signs reviewed and are as follows: BP 100/65 (BP Location: Right Arm)   Pulse 73   Temp 97.6 F (36.4 C) (Oral)   Resp 15   SpO2 97%   Initial impression: Rectal bleeding, on anticoagulation.  12:52 PM Pt stable. Updated on results. Hemoccult with RN chaperone.   1:50 PM Reassessment performed. Patient appears stable.   Labs personally reviewed and interpreted including: CBC with mild elevated white blood cell count of 10.7, anemia though worsened from recent hospitalization at 10.3; CMP with elevated glucose 217, creatinine elevated at 1.66; Hemoccult positive.  Reviewed pertinent lab work and imaging with patient at bedside. Questions answered.   Most current vital signs reviewed and are as follows: BP (!) 109/58   Pulse 65   Temp 97.6 F (36.4 C) (Oral)   Resp 17   SpO2 99%   Plan: Admit to hospital.   Patient discussed with and seen by: Dr. Jeanell Sparrow.   We consulted with Dr. Terrence Dupont, who is on-call for the patient's PCP Dr. Doylene Canard, by telephone.  He will see the patient in the emergency department.  We will consult with [facility].  1:59 PM Consulted with Carl Best PA-C by telephone who is with [facility]. They will see in consultation.                           Medical Decision Making Amount and/or Complexity of Data Reviewed Labs: ordered.  Risk Decision regarding hospitalization.  Patient with ongoing GI bleeding, despite discontinuation of anticoagulation.  She had dizziness this morning with increased bleeding.  Hemodynamically stable at this time.  She has maroon blood on digital rectal exam, suggestive of ongoing bleeding, although does not appear to be large volume.  Patient will be admitted for monitoring.   Final Clinical Impression(s) / ED  Diagnoses Final diagnoses:  Rectal bleeding    Rx / DC Orders ED Discharge Orders     None         Carlisle Cater, PA-C 07/16/21 1401    Pattricia Boss, MD 07/20/21 1347

## 2021-07-16 NOTE — Consult Note (Signed)
Referring Provider: Carlisle Cater PA-C Primary Care Physician:  [name], MD Primary Gastroenterologist:  Dr. [name]  Reason for Consultation:  Hematochezia, anemia   HPI: Patricia English is a 88 y.o. female with a past medical history of hypertension, coronary artery disease, fibrillation, CVA on Eliquis, type 2 diabetes, CKD, diverticulosis and history of GI bleed 10/2020 (likely diverticular).  She was admitted to the hospital 06/16/2021 with an acute right basal ganlia, insula and parietal lobe stroke.  An echo during this admission showed normal LVEF, mild MR and moderate TR.  Her clinical status stabilized and she was discharged to rehab and eventually discharged home 06/24/2021 on Eliquis, diltiazem, metoprolol, furosemide and pravastatin.  She saw a small amount of bright red blood on the toilet tissue after she passed a bowel movement on Monday 07/12/2021.  She was scheduled to see her cardiologist Dr. Doylene Canard later the same day and during this appointment she mentioned having rectal bleeding so she was instructed to hold her Eliquis for 1 week.  Her last dose of Eliquis was taken on Monday 5/29 around 8 or 9 AM.  No NSAID use she typically passes a fairly normal formed brown bowel movement daily.  However, yesterday she passed a bowel movement with a moderate amount of darker red blood.  She passed 2 additional episodes of dark red blood in her depends throughout the day.  Today, she awakened and went to the bathroom and passed a normal brown stool with a large amount of red blood.  Shortly after, she went to the bathroom and passed a large amount of darker red blood which she stated just poured out of her bottom.  No associated abdominal pain.  She felt a little lightheaded without chest pain, palpitations or shortness of breath.  She presented to the ED for further evaluation.  Labs in the ED showed a WBC count of 10.7.  Hg 10.3 (Hg 11.9 on 06/24/2021).  Hematocrit 33.3.  Platelet  250.  BUN 20 (BUN 42 on 06/23/2021). Creatinine 1.66 ( Cr 1.44 on 5/10).  Normal LFTs.  INR 1.2.  Rectal exam by the ED physician showed maroon blood in the rectal vault.  No further hematochezia since arriving to the ED.  She remains in atrial fibrillation but is hemodynamically stable.  She was admitted to the hospital 10/2020 with a GI bleed and anemia.  At that time, her stools were dark for a few months then passed dark red blood with her bowel movements for few days.  Admission hemoglobin was 9.4 (hemoglobin 12.7 in 2019).  She underwent an EGD by Dr. Lorenso Courier 10/30/2020 which was normal.  A colonoscopy was done on the same date which showed diverticulosis in the sigmoid, descending, transverse and ascending colon, 3 polyps were removed and a few ulcers in the rectum were thought to be due to rectal prolapse.  She also had internal hemorrhoids without evidence of bleeding.  The etiology for her GI bleed was likely diverticular with a hemorrhoidal component and possibly bleeding from an inflamed polyp which was removed.  Her hemoglobin was 9.4 at time of discharge.  Echo 06/17/2021: IMPRESSIONS Left ventricular ejection fraction, by estimation, is 55 to 60%. The left ventricle has normal function. The left ventricle has no regional wall motion abnormalities. There is mild concentric left ventricular hypertrophy. Left ventricular diastolic parameters are indeterminate. 1. 2. Right ventricular systolic function is normal. The right ventricular size is normal. 3. Left atrial size was mild to moderately dilated. 4.  Right atrial size was mildly dilated. The pericardial effusion is anterior to the right ventricle. There is no evidence of cardiac tamponade. 5. The mitral valve is degenerative. Mild mitral valve regurgitation. Moderate mitral annular calcification. 6. 7. Tricuspid valve regurgitation is moderate. The aortic valve is tricuspid. There is mild calcification of the aortic valve. There  is mild thickening of the aortic valve. Aortic valve regurgitation is not visualized. Aortic valve sclerosis is present, with no evidence of aortic valve stenosis. 8. 9. There is Moderate (Grade III) atheroma plaque involving the ascending aorta. The inferior vena cava is normal in size with greater than 50% respiratory variability, suggesting right atrial pressure of 3 mmHg.   PAST GI PROCEDURES:  Colonoscopy 10/31/2020: - Preparation of the colon was inadequate. - The examined portion of the ileum was normal. - Stool in the entire examined colon. - Diverticulosis in the sigmoid colon, in the descending colon, in the transverse colon and in the ascending colon. - Three 4 to 8 mm polyps in the descending colon, in the transverse colon and in the ascending colon, removed with a cold snare. Resected and retrieved. Clips were placed. - A few ulcers in the rectum, suspected to be due to rectal prolapse. Biopsied. - Non-bleeding internal hemorrhoids. Rectal prolapse - No signs of active bleeding. It is suspected that the patient had bleeding from an inflamed polyp that was removed, diverticular bleeding that has resolved, or hemorrhoidal bleeding FINAL MICROSCOPIC DIAGNOSIS:  A. COLON, POLYPECTOMY:  - Tubular adenoma (x3 fragments).  - Inflammatory polyp (x2 fragments).  - No high grade dysplasia or malignancy.   B. RECTUM, ULCER, BIOPSY:  - Ulcer.  - No dysplasia or malignancy.  EGD 10/30/2020: - Normal esophagus. - Normal stomach. Biopsied. - Normal examined duodenum.  Past Medical History:  Diagnosis Date   Coronary artery disease    Diabetes mellitus without complication ([place])    GI bleeding 11/2017   Hypertension    Internal hemorrhoids     Past Surgical History:  Procedure Laterality Date   BIOPSY  10/30/2020   Procedure: BIOPSY;  Surgeon: Sharyn Creamer, MD;  Location: [place];  Service: Gastroenterology;;   BIOPSY  10/31/2020   Procedure: BIOPSY;  Surgeon:  Sharyn Creamer, MD;  Location: [place];  Service: Gastroenterology;;   CATARACT EXTRACTION     COLONOSCOPY WITH PROPOFOL N/A 10/31/2020   Procedure: COLONOSCOPY WITH PROPOFOL;  Surgeon: Sharyn Creamer, MD;  Location: [facility];  Service: Gastroenterology;  Laterality: N/A;   ESOPHAGOGASTRODUODENOSCOPY (EGD) WITH PROPOFOL N/A 08/13/2017   Procedure: ESOPHAGOGASTRODUODENOSCOPY (EGD) WITH PROPOFOL;  Surgeon: [name], MD;  Location: [place];  Service: Endoscopy;  Laterality: N/A;   ESOPHAGOGASTRODUODENOSCOPY (EGD) WITH PROPOFOL N/A 10/30/2020   Procedure: ESOPHAGOGASTRODUODENOSCOPY (EGD) WITH PROPOFOL;  Surgeon: Sharyn Creamer, MD;  Location: [facility];  Service: Gastroenterology;  Laterality: N/A;   FLEXIBLE SIGMOIDOSCOPY N/A 12/10/2017   Procedure: FLEXIBLE SIGMOIDOSCOPY;  Surgeon: [name], MD;  Location: [facility] ENDOSCOPY;  Service: Endoscopy;  Laterality: N/A;   HEMORRHOID BANDING  12/10/2017   Procedure: HEMORRHOID BANDING;  Surgeon: [name], MD;  Location: [place];  Service: Endoscopy;;   HEMOSTASIS CLIP PLACEMENT  10/31/2020   Procedure: HEMOSTASIS CLIP PLACEMENT;  Surgeon: Sharyn Creamer, MD;  Location: [place];  Service: Gastroenterology;;   POLYPECTOMY  10/31/2020   Procedure: POLYPECTOMY;  Surgeon: Sharyn Creamer, MD;  Location: [facility] END[facility]COPY;  Service: Gastroenterology;;    Prior to Admission medications   Medication  Sig Start Date End Date Taking? Authorizing Provider  apixaban (ELIQUIS) 2.5 MG TABS tablet Take 1 tablet (2.5 mg total) by mouth 2 (two) times daily. 07/06/21   Raulkar, Clide Deutscher, MD  benzonatate (TESSALON PERLES) 100 MG capsule Take 2 capsules (200 mg total) by mouth 3 (three) times daily as needed for cough. 07/06/21   Raulkar, Clide Deutscher, MD  Cholecalciferol 25 MCG (1000 UT) tablet Take 1,000 Units by mouth daily.    [provider]  diltiazem (DILACOR XR) 180 MG 24 hr capsule Take 1 capsule (180 mg total) by mouth 2 (two)  times daily. 07/06/21   Raulkar, Clide Deutscher, MD  ferrous sulfate 325 (65 FE) MG tablet Take 325 mg by mouth daily with breakfast.    [provider]  furosemide (LASIX) 40 MG tablet Take 1 tablet (40 mg total) by mouth every Monday, Wednesday, and Friday. 07/07/21   Raulkar, Clide Deutscher, MD  glipiZIDE (GLUCOTROL XL) 5 MG 24 hr tablet Take 1 tablet (5 mg total) by mouth every evening. 07/06/21   Raulkar, Clide Deutscher, MD  metFORMIN (GLUCOPHAGE) 500 MG tablet Take 0.5 tablets (250 mg total) by mouth 2 (two) times daily with a meal. 06/24/21   Love, Ivan Anchors, PA-C  metoprolol tartrate (LOPRESSOR) 25 MG tablet Take 0.5 tablets (12.5 mg total) by mouth every evening. 07/06/21   Raulkar, Clide Deutscher, MD  pantoprazole (PROTONIX) 40 MG tablet Take 1 tablet (40 mg total) by mouth 2 (two) times daily. 07/06/21   Raulkar, Clide Deutscher, MD  pravastatin (PRAVACHOL) 40 MG tablet Take 1 tablet (40 mg total) by mouth daily. 07/13/21   Raulkar, Clide Deutscher, MD    No current facility-administered medications for this encounter.   Current Outpatient Medications  Medication Sig Dispense Refill   apixaban (ELIQUIS) 2.5 MG TABS tablet Take 1 tablet (2.5 mg total) by mouth 2 (two) times daily. 180 tablet 3   benzonatate (TESSALON PERLES) 100 MG capsule Take 2 capsules (200 mg total) by mouth 3 (three) times daily as needed for cough. 20 capsule 0   Cholecalciferol 25 MCG (1000 UT) tablet Take 1,000 Units by mouth daily.     diltiazem (DILACOR XR) 180 MG 24 hr capsule Take 1 capsule (180 mg total) by mouth 2 (two) times daily. 60 capsule 0   ferrous sulfate 325 (65 FE) MG tablet Take 325 mg by mouth daily with breakfast.     furosemide (LASIX) 40 MG tablet Take 1 tablet (40 mg total) by mouth every Monday, Wednesday, and Friday. 30 tablet 3   glipiZIDE (GLUCOTROL XL) 5 MG 24 hr tablet Take 1 tablet (5 mg total) by mouth every evening. 90 tablet 3   metFORMIN (GLUCOPHAGE) 500 MG tablet Take 0.5 tablets (250 mg total) by mouth 2  (two) times daily with a meal.     metoprolol tartrate (LOPRESSOR) 25 MG tablet Take 0.5 tablets (12.5 mg total) by mouth every evening. 90 tablet 3   pantoprazole (PROTONIX) 40 MG tablet Take 1 tablet (40 mg total) by mouth 2 (two) times daily. 180 tablet 3   pravastatin (PRAVACHOL) 40 MG tablet Take 1 tablet (40 mg total) by mouth daily. 90 tablet 3    Allergies as of 07/16/2021 - Review Complete 06/21/2021  Allergen Reaction Noted   Lisinopril  12/08/2017    Family History  Problem Relation Age of Onset   Diabetes Mother    Heart Problems Mother     Social History   Socioeconomic History  Marital status: Widowed    Spouse name: Not on file   Number of children: Not on file   Years of education: Not on file   Highest education level: Not on file  Occupational History   Not on file  Tobacco Use   Smoking status: Never   Smokeless tobacco: Never  Vaping Use   Vaping Use: Never used  Substance and Sexual Activity   Alcohol use: No   Drug use: Never   Sexual activity: Not on file  Other Topics Concern   Not on file  Social History Narrative   Patient is widowed   Denies alcohol tobacco use   Lives with adult son   Social Determinants of Health   Financial Resource Strain: Not on file  Food Insecurity: Not on file  Transportation Needs: Not on file  Physical Activity: Not on file  Stress: Not on file  Social Connections: Not on file  Intimate Partner Violence: Not on file    Review of Systems: Gen: Denies fever, sweats or chills. No weight loss.  CV: Denies chest pain, palpitations or edema. Resp: Denies cough, shortness of breath of hemoptysis.  GI: See HPI denies heartburn, dysphagia, stomach or lower abdominal pain. No diarrhea or constipation.  GU : Denies urinary burning, blood in urine, increased urinary frequency or incontinence. MS: Denies joint pain, muscles aches or weakness. Derm: Denies rash, itchiness, skin lesions or unhealing ulcers. Psych:  Denies depression, anxiety or memory loss. Heme: Denies easy bruising, bleeding. Neuro:  Denies headaches, dizziness or paresthesias. Endo:  Denies any problems with DM, thyroid or adrenal function.  Physical Exam: Vital signs in last 24 hours: Temp:  [97.6 F (36.4 C)] 97.6 F (36.4 C) (06/02 1115) Pulse Rate:  [65-99] 65 (06/02 1245) Resp:  [15-30] 17 (06/02 1245) BP: (100-109)/(58-65) 109/58 (06/02 1245) SpO2:  [97 %-100 %] 99 % (06/02 1245)   General:  Alert 88 year old female in no acute distress. Head:  Normocephalic and atraumatic. Eyes:  No scleral icterus. Conjunctiva pink. Ears:  Normal auditory acuity. Nose:  No deformity, discharge or lesions. Mouth: Poor dentition no ulcers or lesions.  Neck:  Supple. No lymphadenopathy or thyromegaly.  Lungs: Breath sounds clear throughout. Heart: Irregular rhythm, no murmurs. Abdomen: Soft, nontender.  Positive bowel sounds to all 4 quadrants. Rectal: Circumferential external hemorrhoids with a small amount of bright red blood to the external anal area, small pale nodule to the left anal area, internal hemorrhoids without prolapse.  A scant amount of red blood was on the exam glove.  No palpable mass. Musculoskeletal:  Symmetrical without gross deformities.  Pulses:  Normal pulses noted. Extremities:  Without clubbing or edema. Neurologic:  Alert and  oriented x 4. No focal deficits.  Skin:  Intact without significant lesions or rashes. Psych:  Alert and cooperative. Normal mood and affect.  Intake/Output from previous day: No intake/output data recorded. Intake/Output this shift: No intake/output data recorded.  Lab Results: Recent Labs    07/16/21 1149  WBC 10.7*  HGB 10.3*  HCT 33.3*  PLT 250   BMET Recent Labs    07/16/21 1149  NA 137  K 4.1  CL 108  CO2 18*  GLUCOSE 217*  BUN 20  CREATININE 1.66*  CALCIUM 8.9   LFT Recent Labs    07/16/21 1149  PROT 6.8  ALBUMIN 3.4*  AST 15  ALT 10  ALKPHOS 61   BILITOT 0.7   PT/INR Recent Labs    07/16/21 1149  LABPROT 15.4*  INR 1.2   Hepatitis Panel No results for input(s): HEPBSAG, HCVAB, HEPAIGM, HEPBIGM in the last 72 hours.    Studies/Results: No results found.  IMPRESSION/PLAN:  66) 88 year old female recently diagnosed hospitalized with an acute CVA on Eliquis presented to the ED today with painless hematochezia, likely current diverticular bleed.  History of lower GI bleed 10/2020, EGD during her hospitalization was normal and a colonoscopy showed 3 adenomatous polyps, a few ulcers in the rectum and diverticulosis.  Hemoglobin 10.3 (hemoglobin 11.9 on 5/11).  No further hematochezia since arriving to the ED.  Hemodynamically stable.  Last dose of Eliquis was taken on 07/12/2021. -Clear liquid diet -Monitor H&H closely -Transfuse for hemoglobin less than 8 -Tagged red blood cell scan if she develops brisk GI bleeding -Further recommendations per Dr. Hilarie Fredrickson  2) Chronic Atrial fibrillation with CHA2DS2VASc score of 6  3) Acute right basal ganlia, insula and parietal lobe stroke 06/16/2021 -Continue to hold Eliquis -Difficult decision regarding restarting Eliquis long-term in the setting of acute stroke with recurrent lower GI bleeding      Noralyn Pick  07/16/2021, 3:19PM

## 2021-07-16 NOTE — ED Triage Notes (Addendum)
Pt BIB [facility] from home d/t rectal bleeding. Pt was admitted to the hospital on the 8th d/t acute ischemic stroke, was on warfarin during admission then discharged home w eliquis, pt was having rectal bleeding while on both meds , subsequently PCP advised the pt to stop taking eliquis on the 29th for a week.This morning, pt went to the bathroom, blood flushed out her rectum, pt was not straining. Lost about 137m blood, still actively bleeding.bllod appeared bright red per EMS. A&O X4.   BP 108/60 HR 60-100 HX AFIB  CBG 251

## 2021-07-16 NOTE — H&P (Signed)
Patricia English is an 88 y.o. female.   Chief Complaint: Bleeding per rectum for last 5 days HPI: Patient is 88 year old female with past medical history significant for hypertension, recent right basal ganglia on right insula/right parietal lobe ischemic infarction, chronic atrial fibrillation chads vasc score of 8 on chronic anticoagulation , Type 2 diabetes mellitus, coronary artery disease, history of GI bleed in the past, chronic kidney disease stage III, came to ER by EMS complaining of recurrent GI bleeding bright red for last 5 days states went to see PMDs office on Monday and was advised.  Eliquis states bleeding temporarily slowed down but again had large amount of bleeding last night and this morning associated with feeling dizzy and weak so decided to call EMS.  Patient denies any palpitations denies any syncopal episodes.  Denies any chest pain nausea vomiting diaphoresis.  Denies any abdominal pain denies using aspirin or nonsteroidal anti-inflammatory medications.  Patient states she used to be on Coumadin many years ago but stopped because of recurrent bleeding in the past.  Patient was placed on low-dose Eliquis following recent stroke. Past Medical History:  Diagnosis Date   Coronary artery disease    Diabetes mellitus without complication ([place])    GI bleeding 11/2017   Hypertension    Internal hemorrhoids     Past Surgical History:  Procedure Laterality Date   BIOPSY  10/30/2020   Procedure: BIOPSY;  Surgeon: Sharyn Creamer, MD;  Location: [place];  Service: Gastroenterology;;   BIOPSY  10/31/2020   Procedure: BIOPSY;  Surgeon: Sharyn Creamer, MD;  Location: [place];  Service: Gastroenterology;;   CATARACT EXTRACTION     COLONOSCOPY WITH PROPOFOL N/A 10/31/2020   Procedure: COLONOSCOPY WITH PROPOFOL;  Surgeon: Sharyn Creamer, MD;  Location: [place];  Service: Gastroenterology;  Laterality: N/A;   ESOPHAGOGASTRODUODENOSCOPY (EGD) WITH PROPOFOL N/A 08/13/2017    Procedure: ESOPHAGOGASTRODUODENOSCOPY (EGD) WITH PROPOFOL;  Surgeon: [name], MD;  Location: [place];  Service: Endoscopy;  Laterality: N/A;   ESOPHAGOGASTRODUODENOSCOPY (EGD) WITH PROPOFOL N/A 10/30/2020   Procedure: ESOPHAGOGASTRODUODENOSCOPY (EGD) WITH PROPOFOL;  Surgeon: Sharyn Creamer, MD;  Location: [facility];  Service: Gastroenterology;  Laterality: N/A;   FLEXIBLE SIGMOIDOSCOPY N/A 12/10/2017   Procedure: FLEXIBLE SIGMOIDOSCOPY;  Surgeon: [name], MD;  Location: [facility] ENDOSCOPY;  Service: Endoscopy;  Laterality: N/A;   HEMORRHOID BANDING  12/10/2017   Procedure: HEMORRHOID BANDING;  Surgeon: [name], MD;  Location: [facility] ENDOSCOPY;  Service: Endoscopy;;   HEMOSTASIS CLIP PLACEMENT  10/31/2020   Procedure: HEMOSTASIS CLIP PLACEMENT;  Surgeon: Sharyn Creamer, MD;  Location: [place];  Service: Gastroenterology;;   POLYPECTOMY  10/31/2020   Procedure: POLYPECTOMY;  Surgeon: Sharyn Creamer, MD;  Location: [facility] ENDOSCOPY;  Service: Gastroenterology;;    Family History  Problem Relation Age of Onset   Diabetes Mother    Heart Problems Mother    Social History:  reports that she has never smoked. She has never used smokeless tobacco. She reports that she does not drink alcohol and does not use drugs.  Allergies:  Allergies  Allergen Reactions   Lisinopril     This caused laryngeal edema    (Not in a hospital admission)   Results for orders placed or performed during the hospital encounter of 07/16/21 (from the past 48 hour(s))  Comprehensive metabolic panel     Status: Abnormal   Collection Time: 07/16/21 11:49 AM  Result Value Ref Range   Sodium 137 135 - 145  mmol/L   Potassium 4.1 3.5 - 5.1 mmol/L   Chloride 108 98 - 111 mmol/L   CO2 18 (L) 22 - 32 mmol/L   Glucose, Bld 217 (H) 70 - 99 mg/dL    Comment: Glucose reference range applies only to samples taken after fasting for at least 8 hours.   BUN 20 8 - 23 mg/dL   Creatinine, Ser 1.66 (H) 0.44 -  1.00 mg/dL   Calcium 8.9 8.9 - 10.3 mg/dL   Total Protein 6.8 6.5 - 8.1 g/dL   Albumin 3.4 (L) 3.5 - 5.0 g/dL   AST 15 15 - 41 U/L   ALT 10 0 - 44 U/L   Alkaline Phosphatase 61 38 - 126 U/L   Total Bilirubin 0.7 0.3 - 1.2 mg/dL   GFR, Estimated 29 (L) >60 mL/min    Comment: (NOTE) Calculated using the CKD-EPI Creatinine Equation (2021)    Anion gap 11 5 - 15    Comment: Performed at [facility] [address], [place], [place] [address]  CBC with Differential     Status: Abnormal   Collection Time: 07/16/21 11:49 AM  Result Value Ref Range   WBC 10.7 (H) 4.0 - 10.5 K/uL   RBC 3.51 (L) 3.87 - 5.11 MIL/uL   Hemoglobin 10.3 (L) 12.0 - 15.0 g/dL   HCT 33.3 (L) 36.0 - 46.0 %   MCV 94.9 80.0 - 100.0 fL   MCH 29.3 26.0 - 34.0 pg   MCHC 30.9 30.0 - 36.0 g/dL   RDW 13.2 11.5 - 15.5 %   Platelets 250 150 - 400 K/uL   nRBC 0.0 0.0 - 0.2 %   Neutrophils Relative % 80 %   Neutro Abs 8.5 (H) 1.7 - 7.7 K/uL   Lymphocytes Relative 11 %   Lymphs Abs 1.2 0.7 - 4.0 K/uL   Monocytes Relative 7 %   Monocytes Absolute 0.8 0.1 - 1.0 K/uL   Eosinophils Relative 1 %   Eosinophils Absolute 0.1 0.0 - 0.5 K/uL   Basophils Relative 0 %   Basophils Absolute 0.0 0.0 - 0.1 K/uL   Immature Granulocytes 1 %   Abs Immature Granulocytes 0.07 0.00 - 0.07 K/uL    Comment: Performed at [facility], [place] [address] [place], [place] [address]  Protime-INR     Status: Abnormal   Collection Time: 07/16/21 11:49 AM  Result Value Ref Range   Prothrombin Time 15.4 (H) 11.4 - 15.2 seconds   INR 1.2 0.8 - 1.2    Comment: (NOTE) INR goal varies based on device and disease states. Performed at [facility], [place] [address], [place], [place] [address]   Type and screen [place]     Status: None   Collection Time: 07/16/21 12:06 PM  Result Value Ref Range   ABO/RH(D) O POS    Antibody Screen NEG    Sample Expiration      07/19/2021,2359 Performed at [facility], [place] [address], [place], [place] [address]   POC occult blood, ED     Status: Abnormal   Collection Time: 07/16/21 12:51 PM  Result Value Ref Range   Fecal Occult Bld POSITIVE (A) NEGATIVE   No results found.  Review of Systems  Constitutional:  Negative for diaphoresis and fever.  HENT:  Negative for nosebleeds and sore throat.   Respiratory:  Positive for cough.   Cardiovascular:  Negative for chest pain, palpitations and leg swelling.  Gastrointestinal:  Positive for blood in stool. Negative for abdominal distention and abdominal pain.  Genitourinary:  Negative for difficulty urinating.  Neurological:  Positive for dizziness. Negative for syncope and headaches.   Blood pressure (!) 109/58, pulse 65, temperature 97.6 F (36.4 C), temperature source Oral, resp. rate 17, SpO2 99 %. Physical Exam Constitutional:      Appearance: Normal appearance.  HENT:     Head: Normocephalic and atraumatic.  Eyes:     Extraocular Movements: Extraocular movements intact.     Conjunctiva/sclera: Conjunctivae normal.  Cardiovascular:     Rate and Rhythm: Normal rate. Rhythm irregular.     Heart sounds: Murmur (2/6 systolic murmur noted) heard.  Pulmonary:     Effort: Pulmonary effort is normal. No respiratory distress.     Breath sounds: Normal breath sounds. No wheezing or rales.  Abdominal:     General: Abdomen is flat. Bowel sounds are normal. There is no distension.     Palpations: Abdomen is soft.     Tenderness: There is no abdominal tenderness.  Musculoskeletal:     Cervical back: Normal range of motion and neck supple.  Skin:    General: Skin is warm and dry.  Neurological:     Mental Status: She is alert and oriented to person, place, and time.     Assessment/Plan Recurrent acute lower GI bleed History of probable recent cardioembolic right basal ganglia /insula/parietal lobe ischemic infarct Chronic atrial fibrillation CHA2DS2-VASc score of 8 Hypertension Diabetes  mellitus Coronary artery disease Chronic kidney disease stage III History of GI bleed in the past Plan As per orders GI consult Discussed with patient and her daughter at length regarding stopping the anticoagulation and very high risk of stroke due to multiple risk factors and recent stroke understand and agrees to hold anticoagulation for now. Monitor serial CBCs    Review Charolette Forward, MD 07/16/2021, 2:15 PM

## 2021-07-17 DIAGNOSIS — K922 Gastrointestinal hemorrhage, unspecified: Secondary | ICD-10-CM | POA: Diagnosis not present

## 2021-07-17 LAB — BASIC METABOLIC PANEL
Anion gap: 4 — ABNORMAL LOW (ref 5–15)
BUN: 18 mg/dL (ref 8–23)
CO2: 26 mmol/L (ref 22–32)
Calcium: 8.6 mg/dL — ABNORMAL LOW (ref 8.9–10.3)
Chloride: 108 mmol/L (ref 98–111)
Creatinine, Ser: 1.43 mg/dL — ABNORMAL HIGH (ref 0.44–1.00)
GFR, Estimated: 35 mL/min — ABNORMAL LOW (ref >60)
Glucose, Bld: 94 mg/dL (ref 70–99)
Potassium: 3.7 mmol/L (ref 3.5–5.1)
Sodium: 138 mmol/L (ref 135–145)

## 2021-07-17 LAB — GLUCOSE, CAPILLARY
Glucose-Capillary: 108 mg/dL — ABNORMAL HIGH (ref 70–99)
Glucose-Capillary: 180 mg/dL — ABNORMAL HIGH (ref 70–99)
Glucose-Capillary: 159 mg/dL — ABNORMAL HIGH (ref 70–99)
Glucose-Capillary: 191 mg/dL — ABNORMAL HIGH (ref 70–99)

## 2021-07-17 LAB — CBC
HCT: 29.9 % — ABNORMAL LOW (ref 36.0–46.0)
Hemoglobin: 9.8 g/dL — ABNORMAL LOW (ref 12.0–15.0)
MCH: 29.6 pg (ref 26.0–34.0)
MCHC: 32.8 g/dL (ref 30.0–36.0)
MCV: 90.3 fL (ref 80.0–100.0)
Platelets: 239 10*3/uL (ref 150–400)
RBC: 3.31 MIL/uL — ABNORMAL LOW (ref 3.87–5.11)
RDW: 13.1 % (ref 11.5–15.5)
WBC: 9.1 10*3/uL (ref 4.0–10.5)
nRBC: 0 % (ref 0.0–0.2)

## 2021-07-17 NOTE — Progress Notes (Signed)
     [facility] Progress Note  CC:   Hematochezia, anemia   Subjective: She is tolerating a clear liquid diet.  No further hematochezia since admission.  No abdominal pain.  No chest pain or shortness of breath.  No family at the bedside.   Objective:  Vital signs in last 24 hours: Temp:  [97.6 F (36.4 C)-98.1 F (36.7 C)] 97.7 F (36.5 C) (06/03 0800) Pulse Rate:  [65-99] 78 (06/03 0800) Resp:  [15-30] 15 (06/03 0800) BP: (100-124)/(56-69) 121/61 (06/03 0800) SpO2:  [97 %-100 %] 100 % (06/03 0800) Last BM Date : 07/16/21 General: Alert 88 year old female in no acute distress. Heart: Irregular rhythm, very soft systolic murmur. Pulm: Breath sounds clear throughout Abdomen: Soft, mild distention to the lower abdomen.  Nontender.  No masses.  Positive bowel sounds to all 4 quadrants. Extremities:  Without edema. Neurologic:  Alert and  oriented x 4.  Speech is clear.  Moves all extremities. Psych:  Alert and cooperative. Normal mood and affect.  Intake/Output from previous day: 06/02 0701 - 06/03 0700 In: 265.7 [I.V.:265.7] Out: -  Intake/Output this shift: Total I/O In: -  Out: 900 [Urine:900]  Lab Results: Recent Labs    07/16/21 1149 07/17/21 0157  WBC 10.7* 9.1  HGB 10.3* 9.8*  HCT 33.3* 29.9*  PLT 250 239   BMET Recent Labs    07/16/21 1149 07/17/21 0157  NA 137 138  K 4.1 3.7  CL 108 108  CO2 18* 26  GLUCOSE 217* 94  BUN 20 18  CREATININE 1.66* 1.43*  CALCIUM 8.9 8.6*   LFT Recent Labs    07/16/21 1149  PROT 6.8  ALBUMIN 3.4*  AST 15  ALT 10  ALKPHOS 61  BILITOT 0.7   PT/INR Recent Labs    07/16/21 1149  LABPROT 15.4*  INR 1.2   Hepatitis Panel No results for input(s): HEPBSAG, HCVAB, HEPAIGM, HEPBIGM in the last 72 hours.  No results found.  Assessment / Plan:  1) 88 year old female recently diagnosed hospitalized with an acute CVA on Eliquis presented to the ED today with painless hematochezia, likely current  diverticular bleed.  History of lower GI bleed 10/2020, EGD during her hospitalization was normal and a colonoscopy showed 3 adenomatous polyps, a few ulcers in the rectum and diverticulosis.  Hemoglobin 10.3 (Hemoglobin 11.9 on 5/11) -> Today Hg 9.8.  BUN 18. Hemodynamically stable.  Last dose of Eliquis was taken on 07/12/2021. -Advance to soft diet -Monitor H&H closely -Transfuse for hemoglobin < 8 -Tagged red blood cell scan if she develops brisk GI bleeding -Further recommendations per Dr. Hilarie Fredrickson   2) Chronic Atrial fibrillation with CHA2DS2VASc score of 6   3) Acute right basal ganlia, insula and parietal lobe stroke 06/16/2021 -Continue to hold Eliquis -Difficult decision regarding restarting Eliquis long-term in the setting of acute stroke with recurrent lower GI bleeding     Principal Problem:   Acute lower GI bleeding     LOS: 1 day   Noralyn Pick  07/17/2021, 10:00 AM

## 2021-07-17 NOTE — Progress Notes (Signed)
Subjective:  Denies any chest pain or shortness of breath.  Denies abdominal pain.  No further bleeding although has not had BM today.  Hemoglobin slightly down due to hydration.  States was taking amoxicillin at home for bronchitis which was restarted yesterday.  States coughing has improved.  No fever or chills  Objective:  Vital Signs in the last 24 hours: Temp:  [97.6 F (36.4 C)-98.1 F (36.7 C)] 97.7 F (36.5 C) (06/03 0800) Pulse Rate:  [65-99] 78 (06/03 0800) Resp:  [15-30] 15 (06/03 0800) BP: (100-124)/(56-69) 121/61 (06/03 0800) SpO2:  [97 %-100 %] 100 % (06/03 0800)  Intake/Output from previous day: 06/02 0701 - 06/03 0700 In: 265.7 [I.V.:265.7] Out: -  Intake/Output from this shift: Total I/O In: -  Out: 900 [Urine:900]  Physical Exam: Neck: no adenopathy, no carotid bruit, no JVD, and supple, symmetrical, trachea midline Lungs: clear to auscultation bilaterally Heart: irregularly irregular rhythm, S1, S2 normal, and 2/6 systolic murmur noted Abdomen: soft, non-tender; bowel sounds normal; no masses,  no organomegaly Extremities: extremities normal, atraumatic, no cyanosis or edema  Lab Results: Recent Labs    07/16/21 1149 07/17/21 0157  WBC 10.7* 9.1  HGB 10.3* 9.8*  PLT 250 239   Recent Labs    07/16/21 1149 07/17/21 0157  NA 137 138  K 4.1 3.7  CL 108 108  CO2 18* 26  GLUCOSE 217* 94  BUN 20 18  CREATININE 1.66* 1.43*   No results for input(s): TROPONINI in the last 72 hours.  Invalid input(s): CK, MB Hepatic Function Panel Recent Labs    07/16/21 1149  PROT 6.8  ALBUMIN 3.4*  AST 15  ALT 10  ALKPHOS 61  BILITOT 0.7   No results for input(s): CHOL in the last 72 hours. No results for input(s): PROTIME in the last 72 hours.  Imaging: Imaging results have been reviewed and No results found.  Cardiac Studies:  Assessment/Plan:  Recurrent acute lower GI bleed History of probable recent cardioembolic right basal ganglia  /insula/parietal lobe ischemic infarct Chronic atrial fibrillation CHA2DS2-VASc score of 8 Hypertension Diabetes mellitus Coronary artery disease Chronic kidney disease stage III History of GI bleed in the past Resolving bronchitis Plan Advance diet as per GI Out of bed to chair Check labs in a.m. if stable will discharge home if okay with GI Discussed with GI regarding restarting Eliquis if no further bleeding will restart in few days. Will discuss with family also regarding risk and benefits, patient at very high risk for recurrent stroke due to recent stroke and multiple comorbidities.  LOS: 1 day    Patricia English 07/17/2021, 11:13 AM

## 2021-07-18 LAB — CBC
HCT: 30 % — ABNORMAL LOW (ref 36.0–46.0)
Hemoglobin: 10 g/dL — ABNORMAL LOW (ref 12.0–15.0)
MCH: 30.1 pg (ref 26.0–34.0)
MCHC: 33.3 g/dL (ref 30.0–36.0)
MCV: 90.4 fL (ref 80.0–100.0)
Platelets: 231 10*3/uL (ref 150–400)
RBC: 3.32 MIL/uL — ABNORMAL LOW (ref 3.87–5.11)
RDW: 13 % (ref 11.5–15.5)
WBC: 8.8 10*3/uL (ref 4.0–10.5)
nRBC: 0 % (ref 0.0–0.2)

## 2021-07-18 LAB — GLUCOSE, CAPILLARY
Glucose-Capillary: 171 mg/dL — ABNORMAL HIGH (ref 70–99)
Glucose-Capillary: 156 mg/dL — ABNORMAL HIGH (ref 70–99)

## 2021-07-18 LAB — BASIC METABOLIC PANEL
Anion gap: 6 (ref 5–15)
BUN: 14 mg/dL (ref 8–23)
CO2: 25 mmol/L (ref 22–32)
Calcium: 8.7 mg/dL — ABNORMAL LOW (ref 8.9–10.3)
Chloride: 110 mmol/L (ref 98–111)
Creatinine, Ser: 1.34 mg/dL — ABNORMAL HIGH (ref 0.44–1.00)
GFR, Estimated: 37 mL/min — ABNORMAL LOW (ref >60)
Glucose, Bld: 148 mg/dL — ABNORMAL HIGH (ref 70–99)
Potassium: 4 mmol/L (ref 3.5–5.1)
Sodium: 141 mmol/L (ref 135–145)

## 2021-07-18 MED ORDER — POLYETHYLENE GLYCOL 3350 17 G PO PACK: 17.0000 g | PACK | Freq: Every day | ORAL | 0 refills | Status: DC

## 2021-07-18 MED ORDER — METOPROLOL TARTRATE 25 MG PO TABS: 12.5000 mg | ORAL_TABLET | Freq: Two times a day (BID) | ORAL | 3 refills | Status: AC

## 2021-07-18 MED ORDER — DILTIAZEM HCL ER 180 MG PO CP24: 180.0000 mg | ORAL_CAPSULE | Freq: Every day | ORAL | 0 refills | Status: DC

## 2021-07-18 MED ORDER — POLYETHYLENE GLYCOL 3350 17 G PO PACK: 17.0000 g | PACK | Freq: Every day | ORAL | 0 refills | Status: DC | PRN

## 2021-07-18 MED ORDER — METOPROLOL TARTRATE 25 MG PO TABS
25.0000 mg | ORAL_TABLET | Freq: Two times a day (BID) | ORAL | Status: DC
  Administered 2021-07-18: 25 mg via ORAL
  Filled 2021-07-18: qty 1

## 2021-07-18 MED ORDER — AMOXICILLIN-POT CLAVULANATE 500-125 MG PO TABS: 1.0000 | ORAL_TABLET | Freq: Two times a day (BID) | ORAL | 0 refills | Status: DC

## 2021-07-18 MED ORDER — POLYETHYLENE GLYCOL 3350 17 G PO PACK
17.0000 g | PACK | Freq: Every day | ORAL | Status: DC
  Administered 2021-07-18: 17 g via ORAL
  Filled 2021-07-18: qty 1

## 2021-07-18 NOTE — Discharge Summary (Unsigned)
NAME: Patricia English, Patricia English MEDICAL RECORD NO: [id number] ACCOUNT NO: [id number] DATE OF BIRTH: 1928-01-15 FACILITY: [facility] LOCATION: [facility] PHYSICIAN: Allegra Lai. Terrence Dupont, MD  Discharge Summary   DATE OF ADMISSION:  07/16/2021  DATE OF DISCHARGE:  07/18/2021.  ADMITTING DIAGNOSES:   1.  Recurrent lower gastrointestinal bleed. 2.  History of probable recent cardioembolic right basal ganglia/insula/parietal lobe ischemic infarct. 3.  Chronic atrial fibrillation, CHADS-VASc score of 8. 4.  Hypertension. 5.  Diabetes mellitus. 6.  Coronary artery disease. 7.  Chronic kidney disease stage III. 8.  History of gastrointestinal bleed in the past.  DISCHARGE DIAGNOSES: 1.  Status post recurrent probable diverticular lower GI bleed, stable. 2.  History of probable recent cardioembolic right basal ganglia/insula/parietal lobe ischemic infarct. 3.  Chronic atrial fibrillation with CHADS-VASc score of 8. 4.  Hypertension. 5.  Diabetes mellitus. 6.  Coronary artery disease. 7.  Chronic kidney disease stage III. 8.  History of recurrent GI bleed in the past. 9.  Resolving bronchitis.  DISCHARGE HOME MEDICATIONS:   1.  Augmentin 500/125 mg 1 tablet twice daily for 5 more days. 2.  MiraLax 17 grams 1 packet daily as needed for constipation. 3.  Tessalon Perles 2 capsules 3 times daily as needed for cough. 4.  Cholecalciferol 25 mcg 1 tablet daily. 5.  Ferrous sulfate 325 mg daily with breakfast. 6.  Furosemide 40 mg every Monday, Wednesday, Friday. 7.  Glipizide 5 mg every evening. 8.  Isosorbide mononitrate 30 mg daily. 9.  Pantoprazole 40 mg daily. 10.  Pravastatin 40 mg daily. 11.  Diltiazem dose has been reduced to 180 mg 1 capsule daily. 12.  Metoprolol tartrate dose has been increased to 12.5 mg 2 times daily.  The patient has been advised to stop the amoxicillin, Eliquis, and metformin.  HOSPITAL COURSE:  The patient has been advised to monitor blood pressure and heart rate daily.   Monitor blood sugar daily.  The patient will be seen in my office on coming Thursday.  We will repeat her CBC.  If hemoglobin remains stable, we will restart  her on Eliquis 2.5 mg twice daily as before.  Discussed with the patient and her daughter at length and agrees for the above plan.  Also discussed with GI and agrees for restarting Eliquis one more time as she is at very high risk of stroke with multiple comorbidities and recent stroke.  If she starts bleeding again, then probably may need HIDA scan as outpatient and stopping the  anticoagulants.  CONDITION AT DISCHARGE:  Stable.  BRIEF HISTORY AND HOSPITAL COURSE:  The patient is a 88 year old female with past medical history significant for hypertension, recent right basal ganglia/right insula/right parietal lobe ischemic infarct, chronic atrial fibrillation, CHADS-VASc score of  8 on chronic anticoagulation, hypertension, diabetes mellitus, coronary artery disease, history of GI bleed in the past, chronic kidney disease stage III.  She came to the ER by EMS complaining of recurrent GI bleeding bright red blood for the last 5  days.  She had went to see PMD in his office on Monday and was advised to stop Eliquis.  States the bleeding temporarily slowed down, but again had large amount of bleeding last night and this morning, associated with feeling dizzy and weak, so decided  to call EMS.  The patient denies any palpitation.  Denies any syncopal episode.  Denies any chest pain, nausea, vomiting, diaphoresis.  Denies any abdominal pain.  Denies using aspirin or nonsteroidal anti-inflammatory medication.  The patient states she  used to be on Coumadin, many years ago, but stopped because of recurrent bleeding in the past.  The patient had diverticular bleeding in the past.  The patient was placed on low-dose Eliquis following recent stroke.  PHYSICAL EXAMINATION: GENERAL:  She was alert, awake, oriented x3. VITAL SIGNS:  Blood pressure was  109/58, pulse 65.  She was afebrile. HEENT:  Conjunctivae was pink. NECK:  Supple, no JVD, no bruit. LUNGS:  Clear to auscultation. CARDIOVASCULAR:  Irregularly irregular. S1, S2 was normal.  There was 2/6 systolic murmur. ABDOMEN:  Soft.  Bowel sounds present, nontender. EXTREMITIES:  There is no clubbing, cyanosis or edema.  LABORATORY DATA:  Sodium was 137, potassium 4.1, BUN was 20, creatinine 1.66, glucose 134.  Hemoglobin was 10.3, hematocrit 33.3, white count of 10.7.  Repeat hemoglobin yesterday was 9.8, hematocrit 29.9 today, hemoglobin is 10, hematocrit 30, white  count of 8.8, BUN is 14, creatinine 1.34, glucose today is 148, potassium is 4.0.  BRIEF HOSPITAL COURSE:  The patient was admitted to telemetry unit.  Eliquis was held.  GI consultation was obtained.  The patient was felt not a good candidate for repeat colonoscopy or upper endoscopy.  The patient had colonoscopy in the past, which  showed pan diverticular disease.  The patient did not have any episodes of bleeding during the hospital stay.  Her hemoglobin has been stable.  Discussed with the patient and her daughter at length regarding restarting Eliquis in few days if there are no  further episodes of bleeding to which they agree.  The patient will be scheduled for bleeding scan if she has recurrent brisk bleeding as outpatient.  The patient will follow up with GI as scheduled.  The patient will be seen in my office in few days.   We will repeat the CBC on coming Thursday.  If no further episodes of bleeding and hemoglobin remained stable, we will restart the Eliquis.  The patient will follow up with her PMD in 2 weeks with Dr. Doylene Canard.  CONDITION AT DISCHARGE:  Stable.   PUS D: 07/18/2021 12:59:49 pm T: 07/18/2021 3:34:00 pm  JOB: [id number]/ [id number]

## 2021-07-18 NOTE — Discharge Summary (Signed)
Discharge summary dictated on 07/18/2021 dictation number is [id number]

## 2021-07-21 ENCOUNTER — Other Ambulatory Visit: Payer: Self-pay

## 2021-07-21 ENCOUNTER — Telehealth: Payer: Self-pay

## 2021-07-21 ENCOUNTER — Inpatient Hospital Stay (HOSPITAL_COMMUNITY)
Admission: EM | Admit: 2021-07-21 | Discharge: 2021-07-23 | DRG: 378 | Disposition: A | Discharge status: Home Health | Payer: Medicare Other | Attending: Internal Medicine | Admitting: Internal Medicine

## 2021-07-21 DIAGNOSIS — K5731 Diverticulosis of large intestine without perforation or abscess with bleeding: Principal | ICD-10-CM | POA: Diagnosis present

## 2021-07-21 DIAGNOSIS — I129 Hypertensive chronic kidney disease with stage 1 through stage 4 chronic kidney disease, or unspecified chronic kidney disease: Secondary | ICD-10-CM | POA: Diagnosis present

## 2021-07-21 DIAGNOSIS — Z7901 Long term (current) use of anticoagulants: Secondary | ICD-10-CM

## 2021-07-21 DIAGNOSIS — Z7984 Long term (current) use of oral hypoglycemic drugs: Secondary | ICD-10-CM

## 2021-07-21 DIAGNOSIS — K219 Gastro-esophageal reflux disease without esophagitis: Secondary | ICD-10-CM | POA: Diagnosis present

## 2021-07-21 DIAGNOSIS — E1122 Type 2 diabetes mellitus with diabetic chronic kidney disease: Secondary | ICD-10-CM | POA: Diagnosis present

## 2021-07-21 DIAGNOSIS — I10 Essential (primary) hypertension: Secondary | ICD-10-CM | POA: Diagnosis present

## 2021-07-21 DIAGNOSIS — I480 Paroxysmal atrial fibrillation: Secondary | ICD-10-CM | POA: Diagnosis present

## 2021-07-21 DIAGNOSIS — D62 Acute posthemorrhagic anemia: Secondary | ICD-10-CM | POA: Diagnosis present

## 2021-07-21 DIAGNOSIS — K5903 Drug induced constipation: Secondary | ICD-10-CM

## 2021-07-21 DIAGNOSIS — Z8673 Personal history of transient ischemic attack (TIA), and cerebral infarction without residual deficits: Secondary | ICD-10-CM

## 2021-07-21 DIAGNOSIS — I2510 Atherosclerotic heart disease of native coronary artery without angina pectoris: Secondary | ICD-10-CM | POA: Diagnosis present

## 2021-07-21 DIAGNOSIS — N1832 Chronic kidney disease, stage 3b: Secondary | ICD-10-CM | POA: Diagnosis present

## 2021-07-21 DIAGNOSIS — K641 Second degree hemorrhoids: Secondary | ICD-10-CM | POA: Diagnosis present

## 2021-07-21 DIAGNOSIS — Z800 Family history of malignant neoplasm of digestive organs: Secondary | ICD-10-CM

## 2021-07-21 DIAGNOSIS — E785 Hyperlipidemia, unspecified: Secondary | ICD-10-CM | POA: Diagnosis present

## 2021-07-21 DIAGNOSIS — E119 Type 2 diabetes mellitus without complications: Secondary | ICD-10-CM

## 2021-07-21 DIAGNOSIS — Z833 Family history of diabetes mellitus: Secondary | ICD-10-CM

## 2021-07-21 DIAGNOSIS — K5790 Diverticulosis of intestine, part unspecified, without perforation or abscess without bleeding: Secondary | ICD-10-CM

## 2021-07-21 DIAGNOSIS — K922 Gastrointestinal hemorrhage, unspecified: Secondary | ICD-10-CM | POA: Diagnosis present

## 2021-07-21 DIAGNOSIS — K644 Residual hemorrhoidal skin tags: Secondary | ICD-10-CM | POA: Diagnosis present

## 2021-07-21 DIAGNOSIS — E872 Acidosis, unspecified: Secondary | ICD-10-CM | POA: Diagnosis present

## 2021-07-21 DIAGNOSIS — D136 Benign neoplasm of pancreas: Secondary | ICD-10-CM | POA: Diagnosis present

## 2021-07-21 DIAGNOSIS — Z79899 Other long term (current) drug therapy: Secondary | ICD-10-CM

## 2021-07-21 LAB — COMPREHENSIVE METABOLIC PANEL
ALT: 10 U/L (ref 0–44)
AST: 23 U/L (ref 15–41)
Albumin: 3.5 g/dL (ref 3.5–5.0)
Alkaline Phosphatase: 67 U/L (ref 38–126)
Anion gap: 10 (ref 5–15)
BUN: 16 mg/dL (ref 8–23)
CO2: 20 mmol/L — ABNORMAL LOW (ref 22–32)
Calcium: 8.9 mg/dL (ref 8.9–10.3)
Chloride: 108 mmol/L (ref 98–111)
Creatinine, Ser: 1.45 mg/dL — ABNORMAL HIGH (ref 0.44–1.00)
GFR, Estimated: 34 mL/min — ABNORMAL LOW (ref >60)
Glucose, Bld: 138 mg/dL — ABNORMAL HIGH (ref 70–99)
Potassium: 4.3 mmol/L (ref 3.5–5.1)
Sodium: 138 mmol/L (ref 135–145)
Total Bilirubin: 0.8 mg/dL (ref 0.3–1.2)
Total Protein: 7 g/dL (ref 6.5–8.1)

## 2021-07-21 LAB — CBG MONITORING, ED: Glucose-Capillary: 121 mg/dL — ABNORMAL HIGH (ref 70–99)

## 2021-07-21 LAB — CBC
HCT: 30.2 % — ABNORMAL LOW (ref 36.0–46.0)
Hemoglobin: 9.6 g/dL — ABNORMAL LOW (ref 12.0–15.0)
MCH: 29.4 pg (ref 26.0–34.0)
MCHC: 31.8 g/dL (ref 30.0–36.0)
MCV: 92.4 fL (ref 80.0–100.0)
Platelets: 246 10*3/uL (ref 150–400)
RBC: 3.27 MIL/uL — ABNORMAL LOW (ref 3.87–5.11)
RDW: 13.4 % (ref 11.5–15.5)
WBC: 10.8 10*3/uL — ABNORMAL HIGH (ref 4.0–10.5)
nRBC: 0 % (ref 0.0–0.2)

## 2021-07-21 LAB — POC OCCULT BLOOD, ED: Fecal Occult Bld: POSITIVE — AB

## 2021-07-21 LAB — TYPE AND SCREEN
ABO/RH(D): O POS
Antibody Screen: NEGATIVE

## 2021-07-21 LAB — PROTIME-INR
INR: 1.2 (ref 0.8–1.2)
Prothrombin Time: 14.7 seconds (ref 11.4–15.2)

## 2021-07-21 MED ORDER — METOCLOPRAMIDE HCL 5 MG/ML IJ SOLN
10.0000 mg | Freq: Four times a day (QID) | INTRAMUSCULAR | Status: AC
  Administered 2021-07-21 (×2): 10 mg via INTRAVENOUS
  Filled 2021-07-21 (×2): qty 2

## 2021-07-21 MED ORDER — FUROSEMIDE 40 MG PO TABS
40.0000 mg | ORAL_TABLET | ORAL | Status: DC
  Administered 2021-07-23: 40 mg via ORAL
  Filled 2021-07-21: qty 1

## 2021-07-21 MED ORDER — PEG-KCL-NACL-NASULF-NA ASC-C 100 G PO SOLR
0.5000 | Freq: Once | ORAL | Status: AC
  Administered 2021-07-21: 100 g via ORAL
  Filled 2021-07-21: qty 1

## 2021-07-21 MED ORDER — METOPROLOL TARTRATE 12.5 MG HALF TABLET
12.5000 mg | ORAL_TABLET | Freq: Two times a day (BID) | ORAL | Status: DC
  Administered 2021-07-21 – 2021-07-23 (×3): 12.5 mg via ORAL
  Filled 2021-07-21 (×3): qty 1

## 2021-07-21 MED ORDER — PRAVASTATIN SODIUM 40 MG PO TABS
40.0000 mg | ORAL_TABLET | Freq: Every day | ORAL | Status: DC
  Administered 2021-07-21 – 2021-07-22 (×2): 40 mg via ORAL
  Filled 2021-07-21 (×2): qty 1

## 2021-07-21 MED ORDER — GLIPIZIDE ER 5 MG PO TB24
5.0000 mg | ORAL_TABLET | Freq: Every evening | ORAL | Status: DC
  Administered 2021-07-21 – 2021-07-22 (×2): 5 mg via ORAL
  Filled 2021-07-21 (×3): qty 1

## 2021-07-21 MED ORDER — SODIUM CHLORIDE 0.9 % IV BOLUS
500.0000 mL | Freq: Once | INTRAVENOUS | Status: AC
  Administered 2021-07-21: 500 mL via INTRAVENOUS

## 2021-07-21 MED ORDER — PANTOPRAZOLE INFUSION (NEW) - SIMPLE MED
8.0000 mg/h | INTRAVENOUS | Status: DC
Start: 2021-07-21 — End: 2021-07-21
  Administered 2021-07-21: 8 mg/h via INTRAVENOUS
  Filled 2021-07-21: qty 80

## 2021-07-21 MED ORDER — PANTOPRAZOLE 80MG IVPB - SIMPLE MED
80.0000 mg | Freq: Once | INTRAVENOUS | Status: AC
Start: 2021-07-21 — End: 2021-07-21
  Administered 2021-07-21: 80 mg via INTRAVENOUS
  Filled 2021-07-21: qty 80

## 2021-07-21 MED ORDER — PANTOPRAZOLE SODIUM 40 MG PO TBEC
40.0000 mg | DELAYED_RELEASE_TABLET | Freq: Every day | ORAL | Status: DC
  Administered 2021-07-22 – 2021-07-23 (×2): 40 mg via ORAL
  Filled 2021-07-21 (×2): qty 1

## 2021-07-21 MED ORDER — DILTIAZEM HCL ER COATED BEADS 180 MG PO CP24
180.0000 mg | ORAL_CAPSULE | Freq: Every day | ORAL | Status: DC
  Administered 2021-07-21 – 2021-07-23 (×2): 180 mg via ORAL
  Filled 2021-07-21 (×5): qty 1

## 2021-07-21 MED ORDER — FERROUS SULFATE 325 (65 FE) MG PO TABS: 325.0000 mg | ORAL_TABLET | Freq: Every day | ORAL | Status: DC

## 2021-07-21 MED ORDER — PEG-KCL-NACL-NASULF-NA ASC-C 100 G PO SOLR: 1.0000 | Freq: Once | ORAL | Status: DC

## 2021-07-21 MED ORDER — INSULIN ASPART 100 UNIT/ML IJ SOLN
0.0000 [IU] | Freq: Three times a day (TID) | INTRAMUSCULAR | Status: DC
  Administered 2021-07-22: 2 [IU] via SUBCUTANEOUS
  Administered 2021-07-23: 3 [IU] via SUBCUTANEOUS

## 2021-07-21 MED ORDER — VITAMIN D 25 MCG (1000 UNIT) PO TABS
1000.0000 [IU] | ORAL_TABLET | Freq: Every day | ORAL | Status: DC
Start: 2021-07-22 — End: 2021-07-23
  Administered 2021-07-23: 1000 [IU] via ORAL
  Filled 2021-07-21: qty 1

## 2021-07-21 NOTE — Telephone Encounter (Signed)
Gay Filler in home occupational therapy called:  Seeking a "re-start order" for  in-home physical therapy. Patient was seen in the ED for GI bleed.  Once a week for one week, twice a week for 3 weeks, then once a week for one week.   Please advise. Thank you.

## 2021-07-21 NOTE — ED Notes (Signed)
Pharm working on Engineer, maintenance. Awaiting bowel prep from tube station.

## 2021-07-21 NOTE — H&P (Addendum)
Date: 07/21/2021               Patient Name:  Patricia English MRN: [id number]  DOB: 07-02-1927 Age / Sex: 88 y.o., female   PCP: Patricia Dials, MD              Medical Service: Internal Medicine Teaching Service              Attending Physician: Dr. Sid Falcon, MD    First Contact: Clydell Hakim, MS 3 Pager: [phone]  Second Contact: Dr. Christiana Fuchs Pager: [phone]  Third Contact Dr. Sanjuan Dame  Pager: [phone]       After Hours (After 5p/  First Contact Pager: [phone]  weekends / holidays): Second Contact Pager: [phone]   Chief Complaint: GI Bleed   History of Present Illness:  [name] is a 88 y.o. female with PMH significant for paroxysmal atrial fibrillation not on [facility], recent embolic stroke, recent GI bleed, internal and external hemorrhoids, hypertension, type 2 diabetes mellitus who presented with acute GI bleeding. Patient woke up this morning and went to use the restroom when she noted bright red blood running down her legs. She had associated dizziness but no syncope and did not fall. No abdominal pain, shortness of breath, or chest pain. She was feeling well prior to episode. Notably, last colonoscopy was in 10/2020 that showed diverticulosis and polyps s/p polypectomy. She was on warfarin in the past and this was discontinued due to bleeding. She was also recently admitted on x5 days ago for GIB thought to be from diverticular bleed. Eliquis was discontinued at that time. She was discharged on 06/04. H&H remained stable during this recent admission and her Eliquis was held at time of discharge. Patient has not restarted her Eliquis since she was discharged.  She had one normal bowel movement x2 days ago that was normal with no bright red blood or black/tarry stool. She has good appetite and oral intake with no associated abdominal pain, nausea, or vomiting.   She does have history of recent stroke and has residual left-sided weakness.   ED Course:  Fecal occult  blood is positive. CBC shows hgb of 9.2 and hct of 30.2. She received one dose of Protonix 80 mg IV. Hemodynamically stable with heart rate 74 BPM and blood pressure 134/77.   Review of Systems: A complete ROS was negative except as per HPI.   Past medical history:  Atrial fibrillation Right-sided embolic stroke  Type 2 diabetes mellitus Hypertension  Meds:  Diltiazem 180 mg daily Ferrous sulfate tablet 325 mg daily Lasix 40 mg daily Glipizide 5 mg every evening Metoprolol tartrate 12.5 mg BID Pravastatin 40 mg daily Protonix   Allergies: Allergies as of 07/21/2021 - Review Complete 07/21/2021  Allergen Reaction Noted   Lisinopril  12/08/2017   Past Medical History:  Diagnosis Date   Coronary artery disease    Diabetes mellitus without complication ([place])    GI bleeding 11/2017   Hypertension    Internal hemorrhoids     Family History:  Colon cancer in daughter  Social History:  Lives in [place] with her son  Independent in ADLs, dependent on iADLs since her stroke No history of smoking, EtOH, or other drug use  Physical Exam: Blood pressure 135/72, pulse 75, temperature 98.7 F (37.1 C), temperature source Oral, resp. rate 20, SpO2 100 %.  Constitutional:  Alert and oriented x3.  Cardiovascular:  Irregularly irregular. Brisk cap refill. Mild bilateral edema to the lower extremities.  Respiratory:  No increased work of breathing. No accessory muscle use. Lungs are clear to auscultation.  Abdominal:  Normal bowel sounds present. No tenderness to palpation. No organomegaly.  Extremities: No asymmetry noted to the lower extremities Skin:  Mild conjunctival pallor.  Psych:  Normal mood and behavior.      Latest Ref Rng & Units 07/21/2021   11:33 AM 07/18/2021    1:40 AM 07/17/2021    1:57 AM  CBC  WBC 4.0 - 10.5 K/uL 10.8   8.8   9.1    Hemoglobin 12.0 - 15.0 g/dL 9.6   10.0   9.8    Hematocrit 36.0 - 46.0 % 30.2   30.0   29.9    Platelets 150 - 400 K/uL 246    231   239         Latest Ref Rng & Units 07/21/2021   11:33 AM 07/18/2021    1:40 AM 07/17/2021    1:57 AM  CMP  Glucose 70 - 99 mg/dL 138   148   94    BUN 8 - 23 mg/dL '16   14   18    '$ Creatinine 0.44 - 1.00 mg/dL 1.45   1.34   1.43    Sodium 135 - 145 mmol/L 138   141   138    Potassium 3.5 - 5.1 mmol/L 4.3   4.0   3.7    Chloride 98 - 111 mmol/L 108   110   108    CO2 22 - 32 mmol/L '20   25   26    '$ Calcium 8.9 - 10.3 mg/dL 8.9   8.7   8.6    Total Protein 6.5 - 8.1 g/dL 7.0      Total Bilirubin 0.3 - 1.2 mg/dL 0.8      Alkaline Phos 38 - 126 U/L 67      AST 15 - 41 U/L 23      ALT 0 - 44 U/L 10        Fecal occult blood: Positive  Assessment & Plan by Problem: Principal Problem:   Acute lower GI bleeding Active Problems:   Internal and external bleeding hemorrhoids   DM2 (diabetes mellitus, type 2) ([place])   Essential hypertension  Patient is a 88 y.o. female with PMH significant for recurrent GI bleeds, atrial fibrillation with recently discontinued anticoagulation therapy, recent embolic right-sided stroke, type 2 DM, presenting for acute GI bleed in the setting of recent hospital admission for GI bleed resulting in discontinuation of anticoagulation therapy. Hemoglobin is stable at 9.6 and is hemodynamically stable.   Acute GI Bleed with history of recurrent GI bleed Patient with acute GI Bleed earlier this morning with no associated abdominal or rectal pain however did have some weakness. She was previously on Eliquis but has not taken this since she was recently discharged on 06/04 for an acute GI bleed. She was previously on anticoagulation therapy with history of atrial fibrillation however was discontinued due to recurrent GI bleeds. She was recently restarted in May 2023 for possible right-sided embolic stroke. She has not been on Eliquis since it was discontinued during her most recent admission. Last dose of Eliquis was on 06/02 prior to her recent admission and has not  restarted since she was discharged. Patient was to have an outpatient follow-up appointment tomorrow to discuss restarting anticoagulation therapy. Most recent colonoscopy was in September 2022 which showed diverticulosis in the sigmoid/descending/transverse/ascending colon and is s/p polypectomy  of 3 4-19m polyps. Ulcers noted in rectum, suspected from rectal prolapse. CBC today in the ED showed stable hemoglobin of 9.6, compared to 10.0 noted x3 days ago. Fecal occult blood positive. Currently hemodynamically stable with HR 74 BPM and blood pressure 134/77. She has received one dose of Protonix 80 mg IV. Acute GI bleed possibly diverticular in etiology given history of extensive diverticulosis noted on recent colonoscopy in addition to recent anticoagulation therapy in light of her embolic stroke.  -GI team has been consulted and appreciate further recommendation -Currently on clears for flex sig scheduled for tomorrow morning.  -bowel prep ordered -Trend CBC and transfuse for hgb less than 8.  -Continue to monitor   History of stroke, r parietal lobe Patient with recent right-sided embolic stroke May [id number] She was not anticoagulated prior to this admission due to history of recurrent GI bleed. After discussion, she was restarted on anticoagulation therapy due to concern for embolic stroke. Eliquis was subsequently discontinued during recent admission for GI bleed on 06/02.  -Continue to hold Eliquis with acute GI bleed -Continue to monitor  Atrial fibrillation  Patient with history of atrial fibrillation. She was recently restarted on her Eliquis despite history of recurrent GI bleeds due to an embolic stroke in May [id number] noted above. Eliquis was held during her most recent admission for GI bleed x5 days ago and she has remained off of Eliquis since. On rate control with diltiazem.  -Continue rate control with diltiazem 180 mg daily -Continue to hold Eliquis in light of GI bleed.  -Continue to  monitor on tele  CKD stage 3b NAGMA Baseline creatinine at 1.3-1.4. Creatinine at 1.45. Bicarb at 20. GFR at 34.  -trend BMP  Hypertension Blood pressure stable at 134/77 on diltiazem at home.  -Continue diltiazem 180 mg daily -Continue to monitor  Type 2 Diabetes mellitus Well controlled. Last A1c 6.3 on 07/16/2021.  -Continue glipizide 5 mg every evening -SSI  GERD Patient has history of GERD, on protonix at home. She received one dose of IV in light of her acute GIB.  -Continue home Protonix 40 mg oral.   Coronary artery disease  HLD LHC in 2012 showed minimal LAD coronary artery disease that was treated medically. Lipid panel last month LDL 79. -Continue risk factor control of T2DM and HTN -Continue pravastatin 40 mg daily  DVT prophx: No anticoagulation 2/2 GIB, SCDs Diet: Clears Bowel: prep Code: Full  Prior to Admission Living Arrangement: Home Anticipated Discharge Location: Home Barriers to Discharge: Medical Workup   Dispo: Admit patient to Observation with expected length of stay less than 2 midnights.  Signed: YClydell Hakim Medical Student 07/21/2021, 5:12 PM  Pager: [phone] Attestation for Student Documentation:  I personally was present and performed or re-performed the history, physical exam and medical decision-making activities of this service and have verified that the service and findings are accurately documented in the student's note.  Sallie Maker, KJoellen Jersey DO 07/21/2021, 6:47 PM

## 2021-07-21 NOTE — Consult Note (Signed)
[facility] Consult: 2:45 PM 07/21/2021  LOS: 0 days    Referring Provider: Dr Langston Masker in ED  Primary Care Physician:  [name], MD Primary Gastroenterologist:  Dr Carlean Purl   Reason for Consultation:  recurrent hematochezia.     HPI: Patricia English is a 88 y.o. female.  PMH chronic A-fib.  This was initially treated with Coumadin but eventually Coumadin discontinued.  DM2.  CAD.  HTN.  CKD 3.  Diverticulosis.  Lower GI bleed 10/2020..      07/2017 EGD for dysphagia.  Dr. Carlean Purl saw laryngeal edema most prominent on left false vocal cord.  But otherwise normal study. 11/2017 flex sig for hematochezia.  Prolapsing internal hemorrhoids, felt to be source of bleeding.  Decreased rectal sphincter tone.  Dr. Carlean Purl banded internal and external hemorrhoids.  Nonbleeding sigmoid diverticulosis. 10/30/2020 EGD: Melena, hematochezia, anemia.  Study to duodenum normal. 10/31/2021 colonoscopy.  Inadequate prep.  Examined terminal ileum normal.  Stool throughout the colon.  Diverticulosis throughout the colon.  Resection/retrieval and sites clipped after removal of 3, 4 to 8 mm, polyps.  Some rectal ulcers suspected due to rectal prolapse, biopsied.  Nonbleeding internal hemorrhoids.  Rectal prolapse.  Dr. Lorenso Courier suspected patient had blood from an inflamed polyp or from now resolved diverticular bleeding or hemorrhoidal bleeding. Polyp path:  TA and inflammatory type polyps.  The rectal ulcer was read as "ulcer, no dysplasia or malignancy Endo fragment was scant there was no underlying colitis. 01/19/2021 CTAP w contrast: For abdominal pain.  Severe, diffuse colonic diverticulosis without complication.  Endometrial thickening.  Indeterminant hypodensity at neck of pancreas measuring 2.4 x 2 cm.  Nonemergent pancreatic protocol  MRI recommended but do not see that this has been pursued and this was never referred to GI. LFTs at the time were normal.     Has not required PRBCs for blood loss anemia in past or recently.    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  Admission 07/16/2021 -07/18/2021 w GI consult of 07/16/2021 for rectal bleeding.  She had seen Dr. Kadakia/cardiologist a few days earlier and mention dark red blood mixed in with a bowel movement.  He advised her to stop her Eliquis 07/12/2021.  However she had subsequent episodes of passing dark red blood.  Subsequently admitted. Hgb 10.3 compared with 11.9 3 weeks earlier.  Platelets okay.  Maroon blood in rectal vault.  A-fib on monitor but hemodynamically stable.  Dr. Hilarie Fredrickson diagnosed this as likely diverticular bleeding.  Had no further bleeding or BM after admission and did not undergo any radiologic imaging such as CTAP angio or nuc med bleeding scan or repeat endoscopic eval.  Cardiologist, Dr. Terrence Dupont felt she was at high risk for stroke so plan was to eventually restart Eliquis but this was and has been off since discharge.  Pertinent discharge medications included Protonix 40 mg daily, MiraLAX daily, ferrous sulfate 325 mg daily though iron studies were not checked and MCV well WNL. In 10/2020  iron 51, ferritin 48 were normal along w normal TIBC, iron sats.    Patient normally has daily brown stools.  However since she had episode of bloody stool late last week, she had not had another bowel movement.  She was sent home on MiraLAX and finally had a bowel movement overnight.  She wears depends and when she got up at 6 AM the depends were soiled with brown stool and a small amount of blood.  She had no belly pain.  Eating well w good appetite.  No dizziness, no weakness.  Palpitations, no chest pain.  Her expressive and receptive aphasia have resolved and her speech is pretty  fluid right now.  Cognition is great.  She has been walking at home short distances in the house with the use of a walker.  Labs from 5 d ago to now: Hgb 10.3..  9.8..  10 (3 days ago)..  9.6 this morning.  Platelets normal, MCV normal.  WBCs 10.8. BUNs/creatinine 16/1.4. INR 1.2, also WNL on 4 other assays dating back to 2019.  Pt lives at home with her son.  She originally had 5 children, 3 are still living.  She has several grandchildren, great-grandchildren and even multiple great great grandchildren.  Her daughter tells me that she remembers all of her grandchildren's birthdays.  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  No alcohol.  No tobacco. Mother died around age 56, patient thinks it was from a stroke.  Father died age 73 with age-related ailments.  No family history of GI bleeding, anemia, colorectal cancer, ulcers.    Past Medical History:  Diagnosis Date   Coronary artery disease    Diabetes mellitus without complication ([place])    GI bleeding 11/2017   Hypertension    Internal hemorrhoids     Past Surgical History:  Procedure Laterality Date   BIOPSY  10/30/2020   Procedure: BIOPSY;  Surgeon: Sharyn Creamer, MD;  Location: [place];  Service: Gastroenterology;;   BIOPSY  10/31/2020   Procedure: BIOPSY;  Surgeon: Sharyn Creamer, MD;  Location: [place];  Service: Gastroenterology;;   CATARACT EXTRACTION     COLONOSCOPY WITH PROPOFOL N/A 10/31/2020   Procedure: COLONOSCOPY WITH PROPOFOL;  Surgeon: Sharyn Creamer, MD;  Location: [place];  Service: Gastroenterology;  Laterality: N/A;   ESOPHAGOGASTRODUODENOSCOPY (EGD) WITH PROPOFOL N/A 08/13/2017   Procedure: ESOPHAGOGASTRODUODENOSCOPY (EGD) WITH PROPOFOL;  Surgeon: [name], MD;  Location: [place];  Service: Endoscopy;  Laterality: N/A;   ESOPHAGOGASTRODUODENOSCOPY (EGD) WITH  PROPOFOL N/A 10/30/2020   Procedure: ESOPHAGOGASTRODUODENOSCOPY (EGD) WITH PROPOFOL;  Surgeon: Sharyn Creamer, MD;  Location: [place];  Service: Gastroenterology;  Laterality: N/A;   FLEXIBLE SIGMOIDOSCOPY N/A 12/10/2017   Procedure: FLEXIBLE SIGMOIDOSCOPY;  Surgeon: [name], MD;  Location: [facility] ENDOSCOPY;  Service: Endoscopy;  Laterality: N/A;   HEMORRHOID BANDING  12/10/2017   Procedure: HEMORRHOID BANDING;  Surgeon: [name], MD;  Location: [place];  Service: Endoscopy;;   HEMOSTASIS CLIP PLACEMENT  10/31/2020   Procedure: HEMOSTASIS CLIP PLACEMENT;  Surgeon: Sharyn Creamer, MD;  Location: [place];  Service: Gastroenterology;;   POLYPECTOMY  10/31/2020   Procedure: POLYPECTOMY;  Surgeon: Sharyn Creamer, MD;  Location: [facility] ENDOSCOPY;  Service: Gastroenterology;;    Prior to Admission medications   Medication Sig Start Date End Date Taking? Authorizing Provider  amoxicillin-clavulanate (AUGMENTIN) 500-125 MG tablet Take 1 tablet (500 mg total) by mouth 2 (two) times daily for 5 days. 07/18/21 07/23/21  Charolette Forward, MD  benzonatate (TESSALON PERLES) 100 MG capsule Take 2 capsules (200 mg total) by mouth 3 (three) times daily as needed for cough. 07/06/21   Raulkar, Clide Deutscher, MD  Cholecalciferol 25 MCG (1000 UT) tablet Take 1,000 Units by mouth daily.    [provider]  diltiazem (DILACOR XR) 180 MG 24 hr capsule Take 1 capsule (180 mg total) by mouth daily. 07/18/21   Charolette Forward, MD  ferrous sulfate 325 (65 FE) MG tablet Take 325 mg by mouth daily with breakfast.    [provider]  furosemide (LASIX) 40 MG tablet Take 1 tablet (40 mg total) by mouth every Monday, Wednesday, and Friday. 07/07/21   Raulkar, Clide Deutscher, MD  glipiZIDE (GLUCOTROL XL) 5 MG 24 hr tablet Take 1 tablet (5 mg total) by mouth every evening. 07/06/21   Raulkar, Clide Deutscher, MD  isosorbide mononitrate (IMDUR) 30 MG 24 hr tablet Take 30 mg by mouth daily. 07/15/21   [provider]  metoprolol tartrate (LOPRESSOR) 25 MG tablet Take 0.5 tablets (12.5 mg total) by mouth 2 (two) times daily. 07/18/21   Charolette Forward, MD  pantoprazole (PROTONIX) 40 MG tablet Take 1 tablet (40 mg total) by mouth 2 (two) times daily. 07/06/21   Raulkar, Clide Deutscher, MD  polyethylene glycol (MIRALAX / GLYCOLAX) 17 g packet Take 17 g by mouth daily as needed for moderate constipation. 07/18/21   Charolette Forward, MD  polyethylene glycol (MIRALAX / GLYCOLAX) 17 g packet Take 17 g by mouth daily. 07/18/21   Charolette Forward, MD  pravastatin (PRAVACHOL) 40 MG tablet Take 1 tablet (40 mg total) by mouth daily. 07/13/21   Izora Ribas, MD    Scheduled Meds:  Infusions:  pantoprazole 8 mg/hr (07/21/21 1443)   PRN Meds:    Allergies as of 07/21/2021 - Review Complete 07/21/2021  Allergen Reaction Noted   Lisinopril  12/08/2017    Family History  Problem Relation Age of Onset   Diabetes Mother    Heart Problems Mother     Social History   Socioeconomic History   Marital status: Widowed    Spouse name: Not on file   Number of children: Not on file   Years of education: Not on file   Highest education level: Not on file  Occupational History   Not on file  Tobacco Use   Smoking status: Never   Smokeless tobacco: Never  Vaping Use   Vaping Use: Never used  Substance and Sexual Activity   Alcohol use: No   Drug use: Never   Sexual activity: Not on file  Other Topics Concern   Not on file  Social History Narrative   Patient is widowed   Denies alcohol tobacco use   Lives with adult son   Social Determinants of Health   Financial Resource Strain: Not on file  Food Insecurity: Not on file  Transportation Needs: Not on file  Physical Activity: Not on file  Stress: Not on file  Social Connections: Not on file  Intimate Partner Violence: Not on file    REVIEW OF SYSTEMS: Constitutional: No weakness, no fatigue. ENT:  No nose bleeds Pulm: No trouble breathing.  No  cough. CV:  No palpitations, no LE edema.  No angina. GU:  No hematuria, no frequency GI: See HPI. Heme: Other than the rectal bleeding,  no unusual bleeding or bruising Transfusions: None. Neuro: Recent stroke related aphasia has resolved and her right sided weakness resolved as well.  No headaches, no peripheral tingling or numbness.  No syncope, no seizures, no falls. Derm:  No itching, no rash or sores.  Endocrine:  No sweats or chills.  No polyuria or dysuria Immunization: Reviewed. Travel:  Not queried.     PHYSICAL EXAM: Vital signs in last 24 hours: Vitals:   07/21/21 1118 07/21/21 1435  BP: 134/77 (!) 113/53  Pulse: 74 68  Resp: 18 20  Temp: 98.7 F (37.1 C) 98.7 F (37.1 C)  SpO2: 98% 100%   Wt Readings from Last 3 Encounters:  07/18/21 75.2 kg  07/06/21 76.7 kg  06/24/21 77.4 kg    General: Very pleasant, elderly, somewhat frail appearing.  She is alert and able to describe recent events quite well. Head: No facial asymmetry or swelling.  No signs of head trauma. Eyes: No conjunctival pallor or scleral icterus.  EOMI. Ears: No obvious hearing deficit Nose: No congestion or discharge Mouth: Lots of missing teeth but no extensive dental caries.  Mucosa is moist, pink, clear.  Tongue tilts to left Neck: No thyromegaly, no JVD. Lungs: Clear bilaterally.  No labored breathing or cough. Heart: Irregularly irregular.  Rate controlled.  No MRG.  S1, S2 present Abdomen: Not tender or distended.  Active bowel sounds.  No HSM, masses, bruits, hernias..   Rectal: No blood or stool on DRE.  Visible external and palpable internal hemorrhoids.  Visually the hemorrhoids are large but not thrombosed.  There is a small punctate, superficial area that may be a shallow ulcer covered by deep red eschar, this is not bleeding.  Another area of the hemorrhoids is hypopigmented, pink but not bleeding. Musc/Skeltl: No joint redness, swelling or gross deformity. Extremities: No  CCE. Neurologic: Speech is fluid.  Moves all 4 limbs, strength not tested.  No tremors.  If I had not read the chart I would not know she had recently had a stroke.  Recited the entire Lord's prayer without issues.  Fully alert and oriented. Skin: No rash, no sores, no suspicious lesions. Nodes: No cervical adenopathy. Psych: Exceedingly pleasant.  Calm.  Intake/Output from previous day: No intake/output data recorded. Intake/Output this shift: No intake/output data recorded.  LAB RESULTS: Recent Labs    07/21/21 1133  WBC 10.8*  HGB 9.6*  HCT 30.2*  PLT 246   BMET Lab Results  Component Value Date   NA 138 07/21/2021   NA 141 07/18/2021   NA 138 07/17/2021   K 4.3 07/21/2021   K 4.0 07/18/2021   K 3.7 07/17/2021   CL 108 07/21/2021   CL 110 07/18/2021   CL 108 07/17/2021   CO2 20 (L) 07/21/2021   CO2 25 07/18/2021   CO2 26 07/17/2021   GLUCOSE 138 (H) 07/21/2021   GLUCOSE 148 (H) 07/18/2021   GLUCOSE 94 07/17/2021   BUN 16 07/21/2021   BUN 14 07/18/2021   BUN 18 07/17/2021   CREATININE 1.45 (H) 07/21/2021   CREATININE 1.34 (H) 07/18/2021   CREATININE 1.43 (H) 07/17/2021   CALCIUM 8.9 07/21/2021   CALCIUM 8.7 (L) 07/18/2021   CALCIUM 8.6 (L) 07/17/2021   LFT Recent Labs    07/21/21 1133  PROT 7.0  ALBUMIN 3.5  AST 23  ALT 10  ALKPHOS 67  BILITOT 0.8   PT/INR Lab Results  Component Value Date   INR 1.2 07/21/2021   INR  1.2 07/16/2021   INR 1.1 06/16/2021   Hepatitis Panel No results for input(s): HEPBSAG, HCVAB, HEPAIGM, HEPBIGM in the last 72 hours. C-Diff No components found for: CDIFF Lipase     Component Value Date/Time   LIPASE 46 10/13/2012 1314    Drugs of Abuse     Component Value Date/Time   LABOPIA NONE DETECTED 06/16/2021 1308   COCAINSCRNUR NONE DETECTED 06/16/2021 1308   LABBENZ NONE DETECTED 06/16/2021 1308   AMPHETMU NONE DETECTED 06/16/2021 1308   THCU NONE DETECTED 06/16/2021 1308   LABBARB NONE DETECTED 06/16/2021  1308     RADIOLOGY STUDIES: No results found.    IMPRESSION:     Blood mixed in brown stool.  Rectal bleeding w dark and red blood last week, may have been diverticular vs hemorrhoidal.  LGIB in September 2022 that was possibly diverticular vs hemorrhoidal vs from inflamed polyp.  Hemorrhoidal banding in 2019.  Amount of blood she passed overnight does not sound diverticular in volume.  Suspect there could be hemorrhoidal bleeding at play as she has had banding of hemorrhoids in the past and on DRE has large but not thrombosed hemorrhoids with a shallow ulcer covered with dark red eschar and another area of hemorrhoid with loss of pigmentation.      [place] anemia.  Hgb 9.6 relatively stable c/w 5 d ago but down from ~ 13.5 (~ 4 gm drop) 1 month ago.  She was not technically iron deficient last week during her admission but she got started on twice daily oral iron and I suspect this has led to the constipation   Indeterminate 2.4 x 2 cm hypodensity at neck of pancreas on CT 01/2021.  Radiologist reading report recommended nonemergent pancreatic protocol MRI which was never pursued.  LFTs are normal and no abd pain, anorexia, wt loss.    PLAN:     D/w Dr Rush Landmark.  Plan flex sig tmrw.  Will have the patient drink a colonoscopy prep to relieve her constipation and get ready for the flex sig but it is unlikely she will have a full colonoscopy as this was just done 9 months ago.  See orders for clear diet and bowel prep.  I discontinued the oral iron as this may very well be the source for her recent out of character constipation.  Not clear oral iron is indicated. Has not had recent iron studies, but these were normal in 10/2020.  Also at colonoscopy iron in the intestinal tract can be confused with blood.  CBC in the morning, ordered.  Dr. Rush Landmark can decide whether or not, in a 88 year old w normal LFTs, no negative GI symptoms such as anorexia, weight loss, abdominal pain, whether it is worth  pursuing MRI.    This is not an upper GI bleed so I have canceled the Protonix drip.  Restarted the oral Protonix.   Azucena Freed  07/21/2021, 2:45 PM Phone [phone]

## 2021-07-21 NOTE — ED Provider Notes (Signed)
[facility] Provider Note   CSN: [id number] Arrival date & time: 07/21/21  1112     History  Chief Complaint  Patient presents with   GI Bleeding    Patricia English is a 88 y.o. female with a past medical history of type 2 diabetes, CVA, lower GI bleed discharged from the hospital 3 days ago presenting today with 2 episodes of hematochezia.  She reports that she woke up this morning with bright red blood in her depends and there was blood throughout the toilet bowl when she used the restroom.  Had 2 episodes of this.  Endorsing some dizziness/lightheadedness.  No palpitations or shortness of breath.  Denies NSAID or EtOH use.  She was placed on Eliquis after an MCA stroke on 5/11 however she has not had this since 5/29.  Colonoscopy last year revealing a polyps, rectal ulcers and internal hemorrhoids.  HPI     Home Medications Prior to Admission medications   Medication Sig Start Date End Date Taking? Authorizing Provider  amoxicillin-clavulanate (AUGMENTIN) 500-125 MG tablet Take 1 tablet (500 mg total) by mouth 2 (two) times daily for 5 days. 07/18/21 07/23/21  Charolette Forward, MD  benzonatate (TESSALON PERLES) 100 MG capsule Take 2 capsules (200 mg total) by mouth 3 (three) times daily as needed for cough. 07/06/21   Raulkar, Clide Deutscher, MD  Cholecalciferol 25 MCG (1000 UT) tablet Take 1,000 Units by mouth daily.    [provider]  diltiazem (DILACOR XR) 180 MG 24 hr capsule Take 1 capsule (180 mg total) by mouth daily. 07/18/21   Charolette Forward, MD  ferrous sulfate 325 (65 FE) MG tablet Take 325 mg by mouth daily with breakfast.    [provider]  furosemide (LASIX) 40 MG tablet Take 1 tablet (40 mg total) by mouth every Monday, Wednesday, and Friday. 07/07/21   Raulkar, Clide Deutscher, MD  glipiZIDE (GLUCOTROL XL) 5 MG 24 hr tablet Take 1 tablet (5 mg total) by mouth every evening. 07/06/21   Raulkar, Clide Deutscher, MD  isosorbide  mononitrate (IMDUR) 30 MG 24 hr tablet Take 30 mg by mouth daily. 07/15/21   [provider]  metoprolol tartrate (LOPRESSOR) 25 MG tablet Take 0.5 tablets (12.5 mg total) by mouth 2 (two) times daily. 07/18/21   Charolette Forward, MD  pantoprazole (PROTONIX) 40 MG tablet Take 1 tablet (40 mg total) by mouth 2 (two) times daily. 07/06/21   Raulkar, Clide Deutscher, MD  polyethylene glycol (MIRALAX / GLYCOLAX) 17 g packet Take 17 g by mouth daily as needed for moderate constipation. 07/18/21   Charolette Forward, MD  polyethylene glycol (MIRALAX / GLYCOLAX) 17 g packet Take 17 g by mouth daily. 07/18/21   Charolette Forward, MD  pravastatin (PRAVACHOL) 40 MG tablet Take 1 tablet (40 mg total) by mouth daily. 07/13/21   Raulkar, Clide Deutscher, MD      Allergies    Lisinopril    Review of Systems   Review of Systems  Physical Exam Updated Vital Signs BP 134/77 (BP Location: Right Arm)   Pulse 74   Temp 98.7 F (37.1 C) (Oral)   Resp 18   SpO2 98%  Physical Exam Vitals and nursing note reviewed.  Constitutional:      Appearance: Normal appearance.  HENT:     Head: Normocephalic and atraumatic.  Eyes:     General: No scleral icterus.    Conjunctiva/sclera: Conjunctivae normal.  Pulmonary:     Effort: Pulmonary effort  is normal. No respiratory distress.  Genitourinary:    Rectum: Guaiac result positive.     Comments: Mildly decreased rectal tone.  1 ruptured external hemorrhoid, other 3 complications.  Bright red blood mixed in with tan stool Skin:    Findings: No rash.  Neurological:     Mental Status: She is alert.  Psychiatric:        Mood and Affect: Mood normal.    ED Results / Procedures / Treatments   Labs (all labs ordered are listed, but only abnormal results are displayed) Labs Reviewed  COMPREHENSIVE METABOLIC PANEL - Abnormal; Notable for the following components:      Result Value   CO2 20 (*)    Glucose, Bld 138 (*)    Creatinine, Ser 1.45 (*)    GFR, Estimated 34 (*)    All  other components within normal limits  CBC - Abnormal; Notable for the following components:   WBC 10.8 (*)    RBC 3.27 (*)    Hemoglobin 9.6 (*)    HCT 30.2 (*)    All other components within normal limits  POC OCCULT BLOOD, ED - Abnormal; Notable for the following components:   Fecal Occult Bld POSITIVE (*)    All other components within normal limits  PROTIME-INR  TYPE AND SCREEN    EKG None  Radiology No results found.  Procedures Procedures   Medications Ordered in ED Medications  pantoprozole (PROTONIX) 80 mg /NS 100 mL infusion (8 mg/hr Intravenous New Bag/Given 07/21/21 1443)  sodium chloride 0.9 % bolus 500 mL (500 mLs Intravenous New Bag/Given 07/21/21 1406)  pantoprazole (PROTONIX) 80 mg /NS 100 mL IVPB (0 mg Intravenous Stopped 07/21/21 1443)    ED Course/ Medical Decision Making/ A&P Clinical Course as of 07/21/21 1519  Wed Jul 21, 2021  1448 I called patient's primary care who is also her cardiologist.  [place]  We will try to admit to unassigned medicine as he has not PCP listed with any group. [MR]    Clinical Course User Index [MR] Clester Chlebowski, Cecilio Asper, PA-C                           Medical Decision Making Amount and/or Complexity of Data Reviewed Labs: ordered.  Risk Decision regarding hospitalization.   This patient presents to the ED for concern of hematochezia.  Differential includes but is not limited to bleeding PUD diverticulitis, colorectal cancer, ulcerative colitis, polyps    This is not an exhaustive differential.    Past Medical History / Co-morbidities / Social History: Recently discharged with a lower GI bleed 2 days ago.  Also has an extensive history of bleeding internal and external hemorrhoids.   Additional history: Additional history obtained from chart review.  Patient was admitted to the hospital on 6/2 with a GI bleed.  She was seen by GI in her hospitalization but they did not intervene and she was  discharged home without necessary follow-up.  At the time of her discharge her hemoglobin was 10   Physical Exam: Physical exam performed. The pertinent findings include: Bleeding external hemorrhoid at the 9 o'clock position.  Other external hemorrhoids without bleeding.  Nonthrombosed.  Palpable internal hemorrhoids and bright red blood in tan stool on rectal.  Lab Tests: I ordered, and personally interpreted labs.  The pertinent results include:  -Hemoglobin 9.6, down from 10 3 days ago -Creatinine 1.45, around patient's baseline.  Medications: I ordered medication including IVF and pantoprazole.    Consultations Obtained: I requested consultation with the gastroenterologist,  and discussed lab and imaging findings as well as pertinent plan - Azucena Freed, PA-C agrees that GI will see the patient during hospitalization. recommends   Disposition: After consideration of the diagnostic results and the patients response to treatment, I feel that patient requires admission for acute GI bleed and symptomatic anemia.  She will be an unassigned admission to Dr. Collene Gobble with [facility] following.  Patient and her daughter at bedside are agreeable to this plan.   Final Clinical Impression(s) / ED Diagnoses Final diagnoses:  Acute GI bleeding    Rx / DC Orders Admit to unassigned     Rhae Hammock, PA-C 07/21/21 1543    Wyvonnia Dusky, MD 07/21/21 [phone]

## 2021-07-21 NOTE — Hospital Course (Addendum)
Acute GI Bleed History of GI Bleed Patient presented with 2 episodes of painless GI bleed. She was not on anticoagulation therapy since her recent hospitalization (05/31-06/04) for GI bleed. She was previously on Eliquis since her embolic stroke in May 8366 in the setting of atrial fibrillation. She was not anticoagulated prior to her stroke due to recurrent GI bleeds in the past on warfarin. Patient remained stable during this admission. She is s/p flex sig that showed non-bleeding non-thrombosed external and internal hemorrhoids as well as diverticulosis. She was restarted on her Eliquis and ___  History of embolic stroke Patient has history of atrial fibrillation however was not anticoagulation due to history of recurrent GI bleed. She was recently started on Eliquis in May 2947 since her embolic stroke however anticoagulation was discontinued due to recurrent GI bleed. She has residual left-sided deficit. She remained stable during this admission and was restarted on Eliquis due to risk of stroke and no evidence of active bleeding noted on flex sig.   Atrial fibrillation She was rate controlled on diltiazem during this admission. Eliquis was restarted as noted above.   Chronic kidney disease, stage 3b Kidney function was monitored and remained stable during this admission.   Hypertension Blood pressure remained stable during this admission on diltiazem.   Type 2 diabetes mellitus Stable during this admission. She was continued on her evening glipizide.   GERD Continued on home protonix.    Coronary artery disease  HLD LHC in 2012 showed minimal LAD coronary artery disease that was treated medically. No chest pain during this hospitalization. Lipid panel last month LDL 79. She was continued on her pravastatin.     No bleeding from Sunday until this morning. She felt like she was doing ok at home. She ahs not had a bowel movement in 2 days. No blood in BM 2 days ago.   She has been  eating well. Denies postprandial pain  Her son lives with her. She needs help with ADL and iADLs since having a stroke  No tobacco or alcohol use no other drug use.  Medications: Pravastatin 40

## 2021-07-21 NOTE — ED Triage Notes (Signed)
Pt from home for eval of BRBPR x 2 this morning. Just d/c from hospitalization for same on Sunday after being placed on Eliquis after stroke. Has not taken blood thinner since 5/29. Denies abdominal pain.

## 2021-07-21 NOTE — ED Notes (Signed)
Pharmacy messaged once more for bowel prep.

## 2021-07-21 NOTE — H&P (View-Only) (Signed)
[facility] Consult: 2:45 PM 07/21/2021  LOS: 0 days    Referring Provider: Dr Langston Masker in ED  Primary Care Physician:  [name], MD Primary Gastroenterologist:  Dr Carlean Purl   Reason for Consultation:  recurrent hematochezia.     HPI: Patricia English is a 88 y.o. female.  PMH chronic A-fib.  This was initially treated with Coumadin but eventually Coumadin discontinued.  DM2.  CAD.  HTN.  CKD 3.  Diverticulosis.  Lower GI bleed 10/2020..      07/2017 EGD for dysphagia.  Dr. Carlean Purl saw laryngeal edema most prominent on left false vocal cord.  But otherwise normal study. 11/2017 flex sig for hematochezia.  Prolapsing internal hemorrhoids, felt to be source of bleeding.  Decreased rectal sphincter tone.  Dr. Carlean Purl banded internal and external hemorrhoids.  Nonbleeding sigmoid diverticulosis. 10/30/2020 EGD: Melena, hematochezia, anemia.  Study to duodenum normal. 10/31/2021 colonoscopy.  Inadequate prep.  Examined terminal ileum normal.  Stool throughout the colon.  Diverticulosis throughout the colon.  Resection/retrieval and sites clipped after removal of 3, 4 to 8 mm, polyps.  Some rectal ulcers suspected due to rectal prolapse, biopsied.  Nonbleeding internal hemorrhoids.  Rectal prolapse.  Dr. Lorenso Courier suspected patient had blood from an inflamed polyp or from now resolved diverticular bleeding or hemorrhoidal bleeding. Polyp path:  TA and inflammatory type polyps.  The rectal ulcer was read as "ulcer, no dysplasia or malignancy Endo fragment was scant there was no underlying colitis. 01/19/2021 CTAP w contrast: For abdominal pain.  Severe, diffuse colonic diverticulosis without complication.  Endometrial thickening.  Indeterminant hypodensity at neck of pancreas measuring 2.4 x 2 cm.  Nonemergent pancreatic protocol  MRI recommended but do not see that this has been pursued and this was never referred to GI. LFTs at the time were normal.     Has not required PRBCs for blood loss anemia in past or recently.    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  Admission 07/16/2021 -07/18/2021 w GI consult of 07/16/2021 for rectal bleeding.  She had seen Dr. Kadakia/cardiologist a few days earlier and mention dark red blood mixed in with a bowel movement.  He advised her to stop her Eliquis 07/12/2021.  However she had subsequent episodes of passing dark red blood.  Subsequently admitted. Hgb 10.3 compared with 11.9 3 weeks earlier.  Platelets okay.  Maroon blood in rectal vault.  A-fib on monitor but hemodynamically stable.  Dr. Hilarie Fredrickson diagnosed this as likely diverticular bleeding.  Had no further bleeding or BM after admission and did not undergo any radiologic imaging such as CTAP angio or nuc med bleeding scan or repeat endoscopic eval.  Cardiologist, Dr. Terrence Dupont felt she was at high risk for stroke so plan was to eventually restart Eliquis but this was and has been off since discharge.  Pertinent discharge medications included Protonix 40 mg daily, MiraLAX daily, ferrous sulfate 325 mg daily though iron studies were not checked and MCV well WNL. In 10/2020  iron 51, ferritin 48 were normal along w normal TIBC, iron sats.    Patient normally has daily brown stools.  However since she had episode of bloody stool late last week, she had not had another bowel movement.  She was sent home on MiraLAX and finally had a bowel movement overnight.  She wears depends and when she got up at 6 AM the depends were soiled with brown stool and a small amount of blood.  She had no belly pain.  Eating well w good appetite.  No dizziness, no weakness.  Palpitations, no chest pain.  Her expressive and receptive aphasia have resolved and her speech is pretty  fluid right now.  Cognition is great.  She has been walking at home short distances in the house with the use of a walker.  Labs from 5 d ago to now: Hgb 10.3..  9.8..  10 (3 days ago)..  9.6 this morning.  Platelets normal, MCV normal.  WBCs 10.8. BUNs/creatinine 16/1.4. INR 1.2, also WNL on 4 other assays dating back to 2019.  Pt lives at home with her son.  She originally had 5 children, 3 are still living.  She has several grandchildren, great-grandchildren and even multiple great great grandchildren.  Her daughter tells me that she remembers all of her grandchildren's birthdays.  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  No alcohol.  No tobacco. Mother died around age 48, patient thinks it was from a stroke.  Father died age 33 with age-related ailments.  No family history of GI bleeding, anemia, colorectal cancer, ulcers.    Past Medical History:  Diagnosis Date   Coronary artery disease    Diabetes mellitus without complication ([place])    GI bleeding 11/2017   Hypertension    Internal hemorrhoids     Past Surgical History:  Procedure Laterality Date   BIOPSY  10/30/2020   Procedure: BIOPSY;  Surgeon: Sharyn Creamer, MD;  Location: [place];  Service: Gastroenterology;;   BIOPSY  10/31/2020   Procedure: BIOPSY;  Surgeon: Sharyn Creamer, MD;  Location: [place];  Service: Gastroenterology;;   CATARACT EXTRACTION     COLONOSCOPY WITH PROPOFOL N/A 10/31/2020   Procedure: COLONOSCOPY WITH PROPOFOL;  Surgeon: Sharyn Creamer, MD;  Location: [place];  Service: Gastroenterology;  Laterality: N/A;   ESOPHAGOGASTRODUODENOSCOPY (EGD) WITH PROPOFOL N/A 08/13/2017   Procedure: ESOPHAGOGASTRODUODENOSCOPY (EGD) WITH PROPOFOL;  Surgeon: [name], MD;  Location: [place];  Service: Endoscopy;  Laterality: N/A;   ESOPHAGOGASTRODUODENOSCOPY (EGD) WITH  PROPOFOL N/A 10/30/2020   Procedure: ESOPHAGOGASTRODUODENOSCOPY (EGD) WITH PROPOFOL;  Surgeon: Sharyn Creamer, MD;  Location: [facility];  Service: Gastroenterology;  Laterality: N/A;   FLEXIBLE SIGMOIDOSCOPY N/A 12/10/2017   Procedure: FLEXIBLE SIGMOIDOSCOPY;  Surgeon: [name], MD;  Location: [facility] ENDOSCOPY;  Service: Endoscopy;  Laterality: N/A;   HEMORRHOID BANDING  12/10/2017   Procedure: HEMORRHOID BANDING;  Surgeon: [name], MD;  Location: [place];  Service: Endoscopy;;   HEMOSTASIS CLIP PLACEMENT  10/31/2020   Procedure: HEMOSTASIS CLIP PLACEMENT;  Surgeon: Sharyn Creamer, MD;  Location: [place];  Service: Gastroenterology;;   POLYPECTOMY  10/31/2020   Procedure: POLYPECTOMY;  Surgeon: Sharyn Creamer, MD;  Location: [facility] ENDOSCOPY;  Service: Gastroenterology;;    Prior to Admission medications   Medication Sig Start Date End Date Taking? Authorizing Provider  amoxicillin-clavulanate (AUGMENTIN) 500-125 MG tablet Take 1 tablet (500 mg total) by mouth 2 (two) times daily for 5 days. 07/18/21 07/23/21  Charolette Forward, MD  benzonatate (TESSALON PERLES) 100 MG capsule Take 2 capsules (200 mg total) by mouth 3 (three) times daily as needed for cough. 07/06/21   Raulkar, Clide Deutscher, MD  Cholecalciferol 25 MCG (1000 UT) tablet Take 1,000 Units by mouth daily.    [provider]  diltiazem (DILACOR XR) 180 MG 24 hr capsule Take 1 capsule (180 mg total) by mouth daily. 07/18/21   Charolette Forward, MD  ferrous sulfate 325 (65 FE) MG tablet Take 325 mg by mouth daily with breakfast.    [provider]  furosemide (LASIX) 40 MG tablet Take 1 tablet (40 mg total) by mouth every Monday, Wednesday, and Friday. 07/07/21   Raulkar, Clide Deutscher, MD  glipiZIDE (GLUCOTROL XL) 5 MG 24 hr tablet Take 1 tablet (5 mg total) by mouth every evening. 07/06/21   Raulkar, Clide Deutscher, MD  isosorbide mononitrate (IMDUR) 30 MG 24 hr tablet Take 30 mg by mouth daily. 07/15/21   [provider]  metoprolol tartrate (LOPRESSOR) 25 MG tablet Take 0.5 tablets (12.5 mg total) by mouth 2 (two) times daily. 07/18/21   Charolette Forward, MD  pantoprazole (PROTONIX) 40 MG tablet Take 1 tablet (40 mg total) by mouth 2 (two) times daily. 07/06/21   Raulkar, Clide Deutscher, MD  polyethylene glycol (MIRALAX / GLYCOLAX) 17 g packet Take 17 g by mouth daily as needed for moderate constipation. 07/18/21   Charolette Forward, MD  polyethylene glycol (MIRALAX / GLYCOLAX) 17 g packet Take 17 g by mouth daily. 07/18/21   Charolette Forward, MD  pravastatin (PRAVACHOL) 40 MG tablet Take 1 tablet (40 mg total) by mouth daily. 07/13/21   Izora Ribas, MD    Scheduled Meds:  Infusions:  pantoprazole 8 mg/hr (07/21/21 1443)   PRN Meds:    Allergies as of 07/21/2021 - Review Complete 07/21/2021  Allergen Reaction Noted   Lisinopril  12/08/2017    Family History  Problem Relation Age of Onset   Diabetes Mother    Heart Problems Mother     Social History   Socioeconomic History   Marital status: Widowed    Spouse name: Not on file   Number of children: Not on file   Years of education: Not on file   Highest education level: Not on file  Occupational History   Not on file  Tobacco Use   Smoking status: Never   Smokeless tobacco: Never  Vaping Use   Vaping Use: Never used  Substance and Sexual Activity   Alcohol use: No   Drug use: Never   Sexual activity: Not on file  Other Topics Concern   Not on file  Social History Narrative   Patient is widowed   Denies alcohol tobacco use   Lives with adult son   Social Determinants of Health   Financial Resource Strain: Not on file  Food Insecurity: Not on file  Transportation Needs: Not on file  Physical Activity: Not on file  Stress: Not on file  Social Connections: Not on file  Intimate Partner Violence: Not on file    REVIEW OF SYSTEMS: Constitutional: No weakness, no fatigue. ENT:  No nose bleeds Pulm: No trouble breathing.  No  cough. CV:  No palpitations, no LE edema.  No angina. GU:  No hematuria, no frequency GI: See HPI. Heme: Other than the rectal bleeding,  no unusual bleeding or bruising Transfusions: None. Neuro: Recent stroke related aphasia has resolved and her right sided weakness resolved as well.  No headaches, no peripheral tingling or numbness.  No syncope, no seizures, no falls. Derm:  No itching, no rash or sores.  Endocrine:  No sweats or chills.  No polyuria or dysuria Immunization: Reviewed. Travel:  Not queried.     PHYSICAL EXAM: Vital signs in last 24 hours: Vitals:   07/21/21 1118 07/21/21 1435  BP: 134/77 (!) 113/53  Pulse: 74 68  Resp: 18 20  Temp: 98.7 F (37.1 C) 98.7 F (37.1 C)  SpO2: 98% 100%   Wt Readings from Last 3 Encounters:  07/18/21 75.2 kg  07/06/21 76.7 kg  06/24/21 77.4 kg    General: Very pleasant, elderly, somewhat frail appearing.  She is alert and able to describe recent events quite well. Head: No facial asymmetry or swelling.  No signs of head trauma. Eyes: No conjunctival pallor or scleral icterus.  EOMI. Ears: No obvious hearing deficit Nose: No congestion or discharge Mouth: Lots of missing teeth but no extensive dental caries.  Mucosa is moist, pink, clear.  Tongue tilts to left Neck: No thyromegaly, no JVD. Lungs: Clear bilaterally.  No labored breathing or cough. Heart: Irregularly irregular.  Rate controlled.  No MRG.  S1, S2 present Abdomen: Not tender or distended.  Active bowel sounds.  No HSM, masses, bruits, hernias..   Rectal: No blood or stool on DRE.  Visible external and palpable internal hemorrhoids.  Visually the hemorrhoids are large but not thrombosed.  There is a small punctate, superficial area that may be a shallow ulcer covered by deep red eschar, this is not bleeding.  Another area of the hemorrhoids is hypopigmented, pink but not bleeding. Musc/Skeltl: No joint redness, swelling or gross deformity. Extremities: No  CCE. Neurologic: Speech is fluid.  Moves all 4 limbs, strength not tested.  No tremors.  If I had not read the chart I would not know she had recently had a stroke.  Recited the entire Lord's prayer without issues.  Fully alert and oriented. Skin: No rash, no sores, no suspicious lesions. Nodes: No cervical adenopathy. Psych: Exceedingly pleasant.  Calm.  Intake/Output from previous day: No intake/output data recorded. Intake/Output this shift: No intake/output data recorded.  LAB RESULTS: Recent Labs    07/21/21 1133  WBC 10.8*  HGB 9.6*  HCT 30.2*  PLT 246   BMET Lab Results  Component Value Date   NA 138 07/21/2021   NA 141 07/18/2021   NA 138 07/17/2021   K 4.3 07/21/2021   K 4.0 07/18/2021   K 3.7 07/17/2021   CL 108 07/21/2021   CL 110 07/18/2021   CL 108 07/17/2021   CO2 20 (L) 07/21/2021   CO2 25 07/18/2021   CO2 26 07/17/2021   GLUCOSE 138 (H) 07/21/2021   GLUCOSE 148 (H) 07/18/2021   GLUCOSE 94 07/17/2021   BUN 16 07/21/2021   BUN 14 07/18/2021   BUN 18 07/17/2021   CREATININE 1.45 (H) 07/21/2021   CREATININE 1.34 (H) 07/18/2021   CREATININE 1.43 (H) 07/17/2021   CALCIUM 8.9 07/21/2021   CALCIUM 8.7 (L) 07/18/2021   CALCIUM 8.6 (L) 07/17/2021   LFT Recent Labs    07/21/21 1133  PROT 7.0  ALBUMIN 3.5  AST 23  ALT 10  ALKPHOS 67  BILITOT 0.8   PT/INR Lab Results  Component Value Date   INR 1.2 07/21/2021   INR  1.2 07/16/2021   INR 1.1 06/16/2021   Hepatitis Panel No results for input(s): HEPBSAG, HCVAB, HEPAIGM, HEPBIGM in the last 72 hours. C-Diff No components found for: CDIFF Lipase     Component Value Date/Time   LIPASE 46 10/13/2012 1314    Drugs of Abuse     Component Value Date/Time   LABOPIA NONE DETECTED 06/16/2021 1308   COCAINSCRNUR NONE DETECTED 06/16/2021 1308   LABBENZ NONE DETECTED 06/16/2021 1308   AMPHETMU NONE DETECTED 06/16/2021 1308   THCU NONE DETECTED 06/16/2021 1308   LABBARB NONE DETECTED 06/16/2021  1308     RADIOLOGY STUDIES: No results found.    IMPRESSION:     Blood mixed in brown stool.  Rectal bleeding w dark and red blood last week, may have been diverticular vs hemorrhoidal.  LGIB in September 2022 that was possibly diverticular vs hemorrhoidal vs from inflamed polyp.  Hemorrhoidal banding in 2019.  Amount of blood she passed overnight does not sound diverticular in volume.  Suspect there could be hemorrhoidal bleeding at play as she has had banding of hemorrhoids in the past and on DRE has large but not thrombosed hemorrhoids with a shallow ulcer covered with dark red eschar and another area of hemorrhoid with loss of pigmentation.      [place] anemia.  Hgb 9.6 relatively stable c/w 5 d ago but down from ~ 13.5 (~ 4 gm drop) 1 month ago.  She was not technically iron deficient last week during her admission but she got started on twice daily oral iron and I suspect this has led to the constipation   Indeterminate 2.4 x 2 cm hypodensity at neck of pancreas on CT 01/2021.  Radiologist reading report recommended nonemergent pancreatic protocol MRI which was never pursued.  LFTs are normal and no abd pain, anorexia, wt loss.    PLAN:     D/w Dr Rush Landmark.  Plan flex sig tmrw.  Will have the patient drink a colonoscopy prep to relieve her constipation and get ready for the flex sig but it is unlikely she will have a full colonoscopy as this was just done 9 months ago.  See orders for clear diet and bowel prep.  I discontinued the oral iron as this may very well be the source for her recent out of character constipation.  Not clear oral iron is indicated. Has not had recent iron studies, but these were normal in 10/2020.  Also at colonoscopy iron in the intestinal tract can be confused with blood.  CBC in the morning, ordered.  Dr. Rush Landmark can decide whether or not, in a 88 year old w normal LFTs, no negative GI symptoms such as anorexia, weight loss, abdominal pain, whether it is worth  pursuing MRI.    This is not an upper GI bleed so I have canceled the Protonix drip.  Restarted the oral Protonix.   Azucena Freed  07/21/2021, 2:45 PM Phone [phone]

## 2021-07-21 NOTE — ED Provider Triage Note (Signed)
Emergency Medicine Provider Triage Evaluation Note  Samiksha Pellicano , a 88 y.o. female  was evaluated in triage.  Pt complains of GI bleeding.  Was discharged from the hospital for this on Sunday and told to stop her Eliquis.  Today she woke up and noted some bright red blood in her depends and when she got on the toilet there was blood all around the toilet bowl.  No pain.  Review of Systems  Positive: GI bleed Negative: Palpitations, syncope  Physical Exam  BP 134/77 (BP Location: Right Arm)   Pulse 74   Temp 98.7 F (37.1 C) (Oral)   Resp 18   SpO2 98%  Gen:   Awake, no distress   Resp:  Normal effort  MSK:   Moves extremities without difficulty  Other:  Well-appearing, normal vital signs  Medical Decision Making  Medically screening exam initiated at 11:29 AM.  Appropriate orders placed.  Kensington Fuerte was informed that the remainder of the evaluation will be completed by another provider, this initial triage assessment does not replace that evaluation, and the importance of remaining in the ED until their evaluation is complete.     Rhae Hammock, PA-C 07/21/21 1130

## 2021-07-22 ENCOUNTER — Inpatient Hospital Stay (HOSPITAL_COMMUNITY): Payer: Medicare Other

## 2021-07-22 ENCOUNTER — Other Ambulatory Visit: Payer: Self-pay | Admitting: Physical Medicine and Rehabilitation

## 2021-07-22 ENCOUNTER — Inpatient Hospital Stay (HOSPITAL_COMMUNITY): Payer: Medicare Other | Admitting: Anesthesiology

## 2021-07-22 ENCOUNTER — Encounter (HOSPITAL_COMMUNITY)
Admission: EM | Disposition: A | Discharge status: Home Health | Payer: Self-pay | Source: Home / Self Care | Attending: Internal Medicine

## 2021-07-22 ENCOUNTER — Encounter (HOSPITAL_COMMUNITY): Payer: Self-pay | Admitting: Internal Medicine

## 2021-07-22 DIAGNOSIS — K862 Cyst of pancreas: Secondary | ICD-10-CM | POA: Diagnosis not present

## 2021-07-22 DIAGNOSIS — K219 Gastro-esophageal reflux disease without esophagitis: Secondary | ICD-10-CM | POA: Diagnosis present

## 2021-07-22 DIAGNOSIS — K649 Unspecified hemorrhoids: Secondary | ICD-10-CM | POA: Diagnosis not present

## 2021-07-22 DIAGNOSIS — Z79899 Other long term (current) drug therapy: Secondary | ICD-10-CM | POA: Diagnosis not present

## 2021-07-22 DIAGNOSIS — E785 Hyperlipidemia, unspecified: Secondary | ICD-10-CM | POA: Diagnosis present

## 2021-07-22 DIAGNOSIS — K644 Residual hemorrhoidal skin tags: Secondary | ICD-10-CM | POA: Diagnosis present

## 2021-07-22 DIAGNOSIS — I10 Essential (primary) hypertension: Secondary | ICD-10-CM | POA: Diagnosis not present

## 2021-07-22 DIAGNOSIS — Z800 Family history of malignant neoplasm of digestive organs: Secondary | ICD-10-CM | POA: Diagnosis not present

## 2021-07-22 DIAGNOSIS — I129 Hypertensive chronic kidney disease with stage 1 through stage 4 chronic kidney disease, or unspecified chronic kidney disease: Secondary | ICD-10-CM | POA: Diagnosis present

## 2021-07-22 DIAGNOSIS — K641 Second degree hemorrhoids: Secondary | ICD-10-CM

## 2021-07-22 DIAGNOSIS — K922 Gastrointestinal hemorrhage, unspecified: Secondary | ICD-10-CM

## 2021-07-22 DIAGNOSIS — Z8673 Personal history of transient ischemic attack (TIA), and cerebral infarction without residual deficits: Secondary | ICD-10-CM | POA: Diagnosis not present

## 2021-07-22 DIAGNOSIS — I2510 Atherosclerotic heart disease of native coronary artery without angina pectoris: Secondary | ICD-10-CM | POA: Diagnosis present

## 2021-07-22 DIAGNOSIS — E872 Acidosis, unspecified: Secondary | ICD-10-CM | POA: Diagnosis present

## 2021-07-22 DIAGNOSIS — N1832 Chronic kidney disease, stage 3b: Secondary | ICD-10-CM | POA: Diagnosis present

## 2021-07-22 DIAGNOSIS — Z7901 Long term (current) use of anticoagulants: Secondary | ICD-10-CM | POA: Diagnosis not present

## 2021-07-22 DIAGNOSIS — I480 Paroxysmal atrial fibrillation: Secondary | ICD-10-CM | POA: Diagnosis present

## 2021-07-22 DIAGNOSIS — K5731 Diverticulosis of large intestine without perforation or abscess with bleeding: Secondary | ICD-10-CM

## 2021-07-22 DIAGNOSIS — D62 Acute posthemorrhagic anemia: Secondary | ICD-10-CM | POA: Diagnosis present

## 2021-07-22 DIAGNOSIS — E1122 Type 2 diabetes mellitus with diabetic chronic kidney disease: Secondary | ICD-10-CM | POA: Diagnosis present

## 2021-07-22 DIAGNOSIS — K625 Hemorrhage of anus and rectum: Secondary | ICD-10-CM | POA: Diagnosis not present

## 2021-07-22 DIAGNOSIS — Z7984 Long term (current) use of oral hypoglycemic drugs: Secondary | ICD-10-CM | POA: Diagnosis not present

## 2021-07-22 DIAGNOSIS — D136 Benign neoplasm of pancreas: Secondary | ICD-10-CM | POA: Diagnosis present

## 2021-07-22 DIAGNOSIS — Z833 Family history of diabetes mellitus: Secondary | ICD-10-CM | POA: Diagnosis not present

## 2021-07-22 HISTORY — PX: FLEXIBLE SIGMOIDOSCOPY: SHX5431

## 2021-07-22 LAB — BASIC METABOLIC PANEL
Anion gap: 8 (ref 5–15)
BUN: 10 mg/dL (ref 8–23)
CO2: 21 mmol/L — ABNORMAL LOW (ref 22–32)
Calcium: 8.6 mg/dL — ABNORMAL LOW (ref 8.9–10.3)
Chloride: 116 mmol/L — ABNORMAL HIGH (ref 98–111)
Creatinine, Ser: 1.2 mg/dL — ABNORMAL HIGH (ref 0.44–1.00)
GFR, Estimated: 43 mL/min — ABNORMAL LOW (ref >60)
Glucose, Bld: 131 mg/dL — ABNORMAL HIGH (ref 70–99)
Potassium: 4.1 mmol/L (ref 3.5–5.1)
Sodium: 145 mmol/L (ref 135–145)

## 2021-07-22 LAB — CBC
HCT: 27.1 % — ABNORMAL LOW (ref 36.0–46.0)
Hemoglobin: 8.8 g/dL — ABNORMAL LOW (ref 12.0–15.0)
MCH: 30.2 pg (ref 26.0–34.0)
MCHC: 32.5 g/dL (ref 30.0–36.0)
MCV: 93.1 fL (ref 80.0–100.0)
Platelets: 221 10*3/uL (ref 150–400)
RBC: 2.91 MIL/uL — ABNORMAL LOW (ref 3.87–5.11)
RDW: 13.7 % (ref 11.5–15.5)
WBC: 8.2 10*3/uL (ref 4.0–10.5)
nRBC: 0 % (ref 0.0–0.2)

## 2021-07-22 LAB — GLUCOSE, CAPILLARY
Glucose-Capillary: 118 mg/dL — ABNORMAL HIGH (ref 70–99)
Glucose-Capillary: 82 mg/dL (ref 70–99)
Glucose-Capillary: 75 mg/dL (ref 70–99)
Glucose-Capillary: 130 mg/dL — ABNORMAL HIGH (ref 70–99)
Glucose-Capillary: 141 mg/dL — ABNORMAL HIGH (ref 70–99)
Glucose-Capillary: 63 mg/dL — ABNORMAL LOW (ref 70–99)

## 2021-07-22 LAB — CBG MONITORING, ED: Glucose-Capillary: 114 mg/dL — ABNORMAL HIGH (ref 70–99)

## 2021-07-22 IMAGING — MR MR ABDOMEN WO/W CM MRCP
20 of 22 series · 46 of 48 positions shown · IV contrast (gadavist)
Comparison: CT abdomen pelvis, [DATE]

CLINICAL DATA: Pancreatic cyst

EXAM:
MRI ABDOMEN WITHOUT AND WITH CONTRAST (INCLUDING MRCP)
TECHNIQUE: Multiplanar multisequence MR imaging of the abdomen was performed
both before and after the administration of intravenous contrast.
Heavily T2-weighted images of the biliary and pancreatic ducts were
obtained, and three-dimensional MRCP images were rendered by post
processing.
CONTRAST:  7.5mL GADAVIST GADOBUTROL 1 MMOL/ML IV SOLN

[Series 4: ax haste · axial · 6.0mm · 1.19mm/px · 1 of 36 slices shown]
[im 1/36]
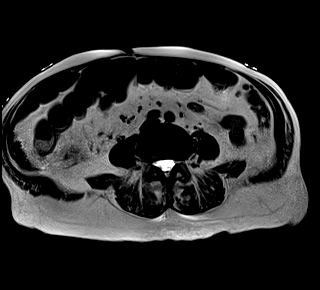

[Series 5: cor haste · coronal · 6.0mm · 1.25mm/px · 1 of 30 slices shown]
[im 1/30]
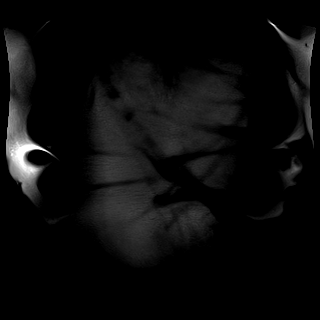

[Series 8: T2 fat-sat · axial · 6.0mm · 1.19mm/px · 1 of 36 slices shown]
[im 1/36]
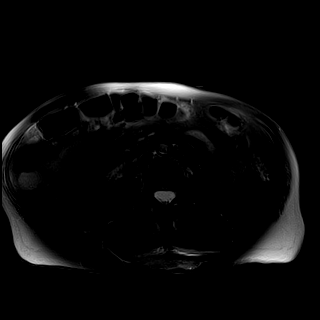

[Series 9: DWI · axial · 6.0mm · 1.46mm/px · z∈[-52,+186]mm · 3 of 102 slices shown (1 of 2)]
[im 1/102]
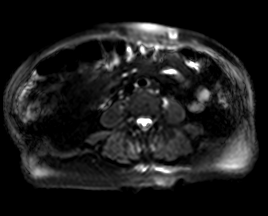
[im 51/102]
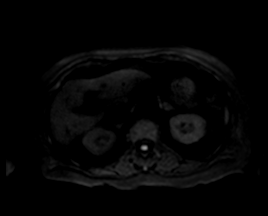
[im 102/102]
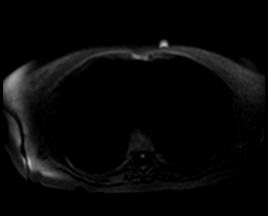

[Series 10: DWI · axial · 6.0mm · 1.46mm/px · 1 of 34 slices shown (2 of 2)]
[im 1/34]
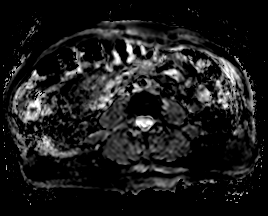

[Series 11: ax in and · axial · 3.0mm · 1.25mm/px · z∈[-54,+183]mm · 2 of 80 slices shown (1 of 2)]
[im 1/80]
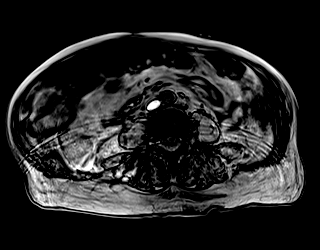
[im 80/80]
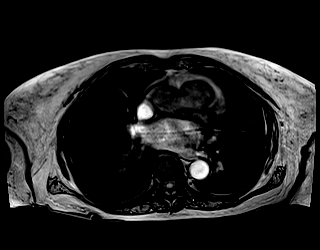

[Series 11: ax in and · axial · 3.0mm · 1.25mm/px · z∈[-54,+183]mm · 2 of 80 slices shown (2 of 2)]
[im 1/80]
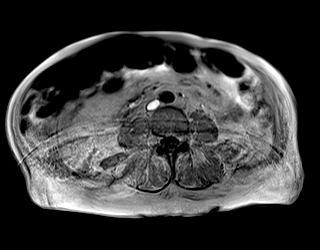
[im 80/80]
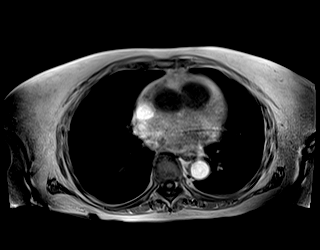

[Series 16: MRCP · coronal · 4.0mm · 1.12mm/px · 1 of 17 slices shown]
[im 1/17]
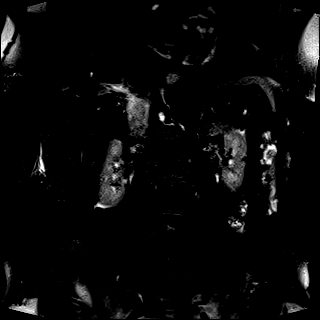

[Series 17: radials · coronal · 50.0mm · 0.78mm/px · 1 of 5 slices shown]
[im 1/5]
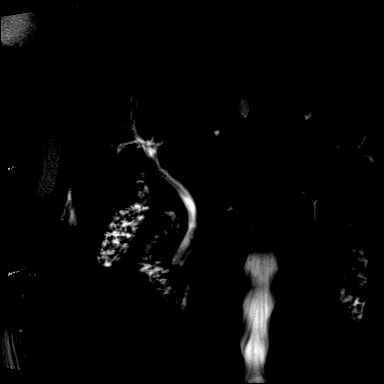

[Series 18: T1 dynamic · axial · non-contrast · 3.0mm · 1.25mm/px · z∈[-43,+194]mm · 3 of 80 slices shown (1 of 6)]
[im 1/80]
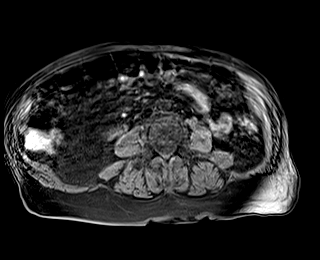
[im 40/80]
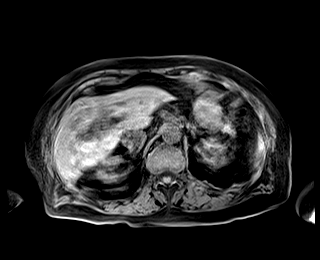
[im 80/80]
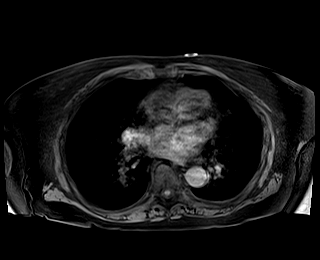

[Series 19: T1 dynamic · axial · non-contrast · 3.0mm · 1.25mm/px · z∈[-43,+194]mm · 3 of 80 slices shown (2 of 6)]
[im 1/80]
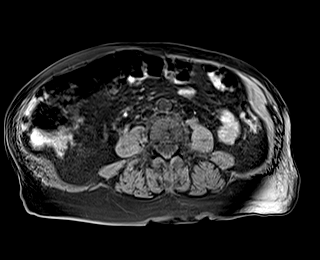
[im 40/80]
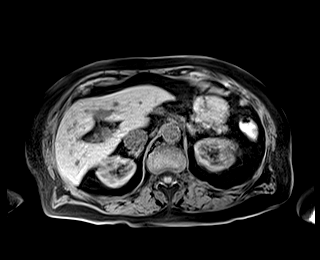
[im 80/80]
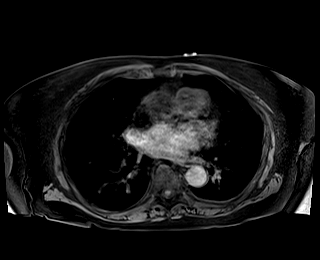

[Series 22: T1 dynamic post-contrast · axial · 3.0mm · 1.25mm/px · z∈[-43,+194]mm · 3 of 80 slices shown (1 of 5)]
[im 1/80]
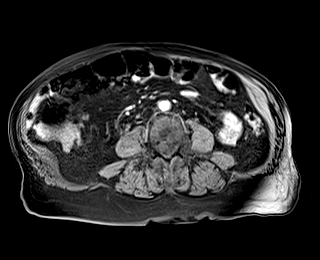
[im 40/80]
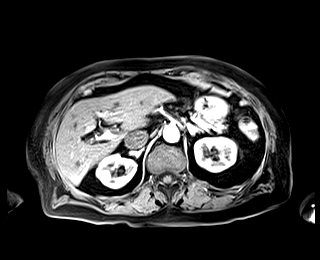
[im 80/80]
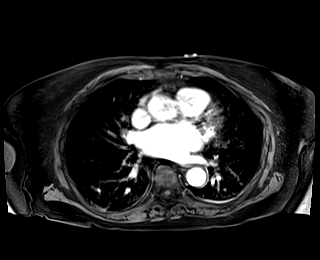

[Series 23: T1 dynamic · axial · 3.0mm · 1.25mm/px · z∈[-43,+194]mm · 3 of 80 slices shown (3 of 6)]
[im 1/80]
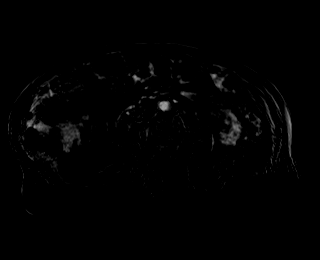
[im 40/80]
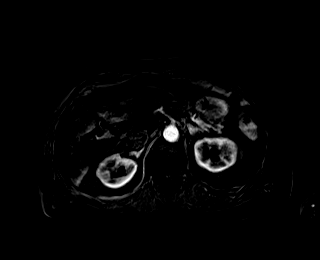
[im 80/80]
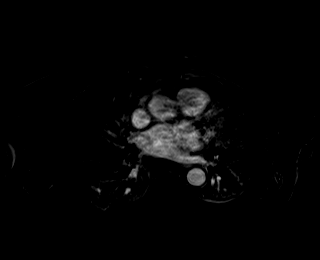

[Series 24: T1 dynamic post-contrast · axial · 3.0mm · 1.25mm/px · z∈[-43,+194]mm · 3 of 80 slices shown (2 of 5)]
[im 1/80]
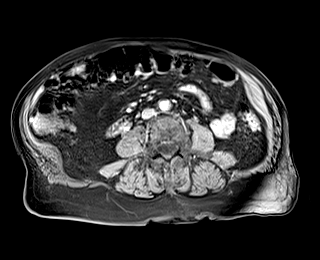
[im 40/80]
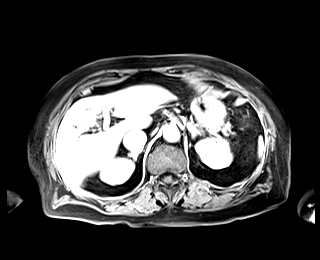
[im 80/80]
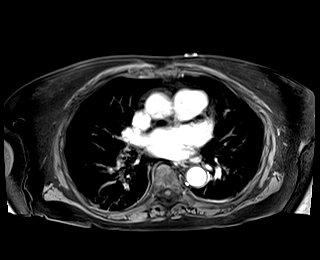

[Series 25: T1 dynamic · axial · 3.0mm · 1.25mm/px · z∈[-43,+194]mm · 3 of 80 slices shown (4 of 6)]
[im 1/80]
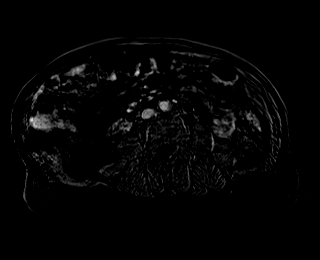
[im 40/80]
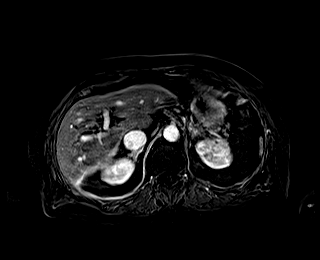
[im 80/80]
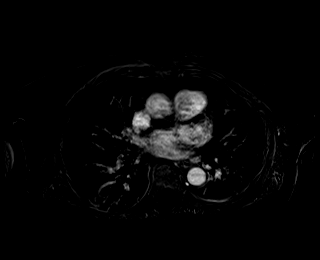

[Series 26: T1 dynamic post-contrast · axial · 3.0mm · 1.25mm/px · z∈[-43,+194]mm · 3 of 80 slices shown (3 of 5)]
[im 1/80]
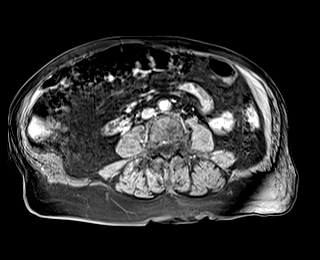
[im 40/80]
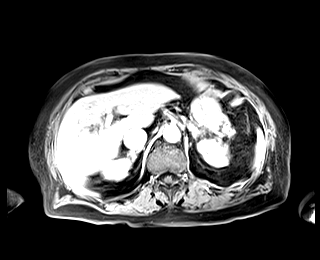
[im 80/80]
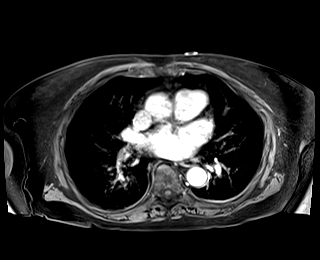

[Series 27: T1 dynamic · axial · 3.0mm · 1.25mm/px · z∈[-43,+194]mm · 3 of 80 slices shown (5 of 6)]
[im 1/80]
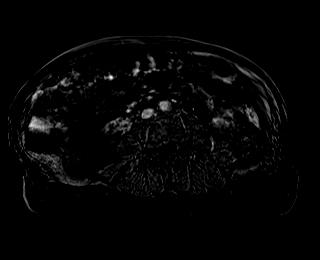
[im 40/80]
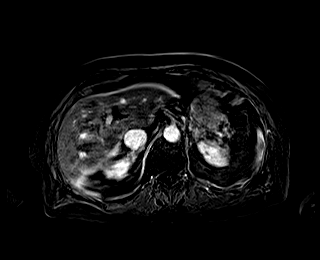
[im 80/80]
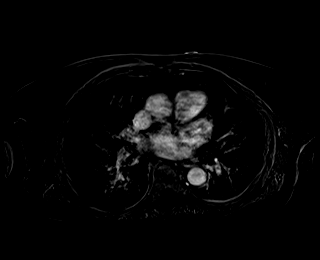

[Series 28: T1 dynamic post-contrast · axial · 3.0mm · 1.25mm/px · z∈[-43,+194]mm · 3 of 80 slices shown (4 of 5)]
[im 1/80]
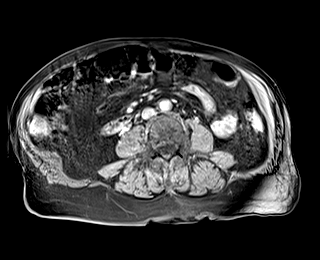
[im 40/80]
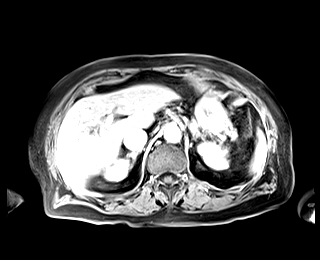
[im 80/80]
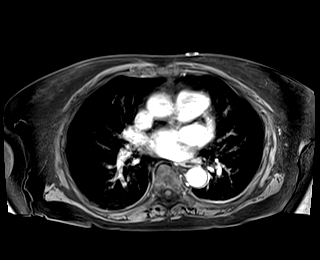

[Series 29: T1 dynamic · axial · 3.0mm · 1.25mm/px · z∈[-43,+194]mm · 3 of 80 slices shown (6 of 6)]
[im 1/80]
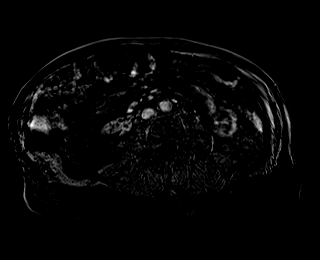
[im 40/80]
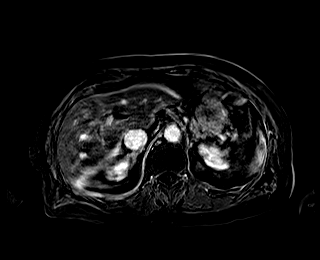
[im 80/80]
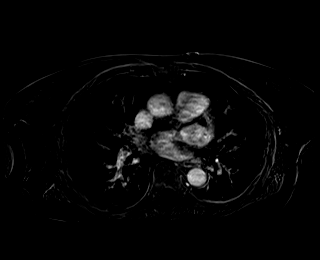

[Series 30: T1 dynamic post-contrast · coronal · 3.0mm · 1.31mm/px · 3 of 80 slices shown (5 of 5)]
[im 1/80]
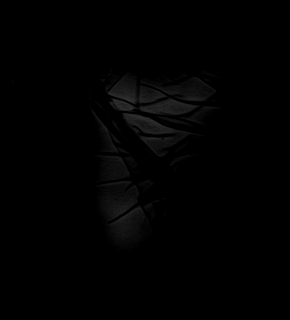
[im 40/80]
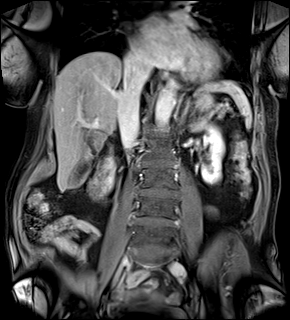
[im 80/80]
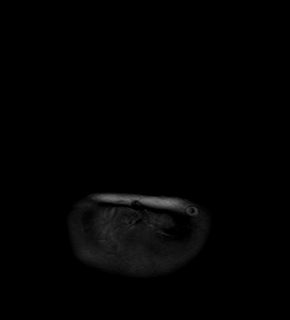

[46 of 48 positions shown; findings below may reference images not displayed]

FINDINGS: Lower chest: No acute findings.  Cardiomegaly.

Hepatobiliary: No mass or other parenchymal abnormality identified.
Numerous gallstones contracted in the gallbladder. No biliary ductal
dilatation.

Pancreas: There is a multiseptated microcystic lesion of the
pancreatic neck measuring 2.6 x 2.5 cm, with atrophy of the
pancreatic parenchyma and dilatation of the pancreatic duct distally
measuring up to 0.5 cm (series 4, image 22). Minimal associated
septal enhancement without nodular solid component. No solid mass,
inflammatory changes, or other parenchymal abnormality identified.

Spleen:  Within normal limits in size and appearance.

Adrenals/Urinary Tract: Normal adrenal glands. Incidental, benign
hemorrhagic or proteinaceous cyst of the posterior midportion of the
left kidney, for which no further follow-up or characterization is
required (series 19, image 51). No solid renal masses or suspicious
contrast enhancement identified. No evidence of hydronephrosis.

Stomach/Bowel: Colonic diverticulosis. Visualized portions within
the abdomen are otherwise unremarkable.

Vascular/Lymphatic: No pathologically enlarged lymph nodes
identified. No abdominal aortic aneurysm demonstrated.

Other:  None.

Musculoskeletal: No suspicious osseous lesions identified.
IMPRESSION: 1. Multiseptated microcystic lesion of the pancreatic neck measuring
2.6 x 2.5 cm, with atrophy of the pancreatic parenchyma and
dilatation of the pancreatic duct distally. Minimal associated
septal enhancement without nodular solid component. Findings are
consistent with serous cystadenoma.
2. Cholelithiasis.
3. Colonic diverticulosis.

## 2021-07-22 SURGERY — SIGMOIDOSCOPY, FLEXIBLE
Anesthesia: Monitor Anesthesia Care

## 2021-07-22 MED ORDER — PROPOFOL 500 MG/50ML IV EMUL
INTRAVENOUS | Status: DC | PRN
  Administered 2021-07-22: 60 ug/kg/min via INTRAVENOUS

## 2021-07-22 MED ORDER — PROPOFOL 10 MG/ML IV BOLUS
INTRAVENOUS | Status: DC | PRN
  Administered 2021-07-22: 60 ug/kg/min via INTRAVENOUS

## 2021-07-22 MED ORDER — CALCIUM POLYCARBOPHIL 625 MG PO TABS
625.0000 mg | ORAL_TABLET | Freq: Every day | ORAL | Status: DC
  Administered 2021-07-22 – 2021-07-23 (×2): 625 mg via ORAL
  Filled 2021-07-22 (×2): qty 1

## 2021-07-22 MED ORDER — LIDOCAINE 2% (20 MG/ML) 5 ML SYRINGE
INTRAMUSCULAR | Status: DC | PRN
  Administered 2021-07-22: 60 mg via INTRAVENOUS

## 2021-07-22 MED ORDER — HYDROCORTISONE ACETATE 25 MG RE SUPP
25.0000 mg | Freq: Every day | RECTAL | Status: DC
  Administered 2021-07-22: 25 mg via RECTAL
  Filled 2021-07-22: qty 1

## 2021-07-22 MED ORDER — GADOBUTROL 1 MMOL/ML IV SOLN
7.5000 mL | Freq: Once | INTRAVENOUS | Status: AC | PRN
  Administered 2021-07-22: 7.5 mL via INTRAVENOUS

## 2021-07-22 MED ORDER — LACTATED RINGERS IV SOLN
INTRAVENOUS | Status: AC | PRN
  Administered 2021-07-22: 1000 mL via INTRAVENOUS

## 2021-07-22 MED ORDER — POLYETHYLENE GLYCOL 3350 17 G PO PACK: 17.0000 g | PACK | Freq: Every day | ORAL | Status: DC | PRN

## 2021-07-22 MED ORDER — DEXTROSE 50 % IV SOLN
12.5000 g | INTRAVENOUS | Status: AC
  Administered 2021-07-22: 12.5 g via INTRAVENOUS
  Filled 2021-07-22: qty 50

## 2021-07-22 MED ORDER — APIXABAN 5 MG PO TABS
5.0000 mg | ORAL_TABLET | Freq: Two times a day (BID) | ORAL | Status: DC
  Administered 2021-07-22 – 2021-07-23 (×2): 5 mg via ORAL
  Filled 2021-07-22 (×2): qty 1

## 2021-07-22 MED ORDER — LIDOCAINE 5 % EX PTCH
1.0000 | MEDICATED_PATCH | CUTANEOUS | Status: DC
  Filled 2021-07-22: qty 1

## 2021-07-22 NOTE — Progress Notes (Addendum)
HD#0 Subjective:  Overnight Events: None  Patient assessed at bedside this AM. She has not had any episodes of bleeding overnight. She denies abdominal pain.  Pt is updated on the plan for today, and all questions and concerns are addressed.   Objective:  Vital signs in last 24 hours: Vitals:   07/22/21 0400 07/22/21 0430 07/22/21 0500 07/22/21 0530  BP: 125/69 116/60 111/66 136/63  Pulse: 82 86 84 78  Resp: 13 17 (!) 22 17  Temp:      TempSrc:      SpO2: 98% 98% 98% 96%   Supplemental O2: Room Air SpO2: 96 %   Physical Exam:  Constitutional: well-appearing, laying in bed Cardiovascular: irregularly irregular, no m/r/g Pulmonary/Chest: normal work of breathing on room air, lungs clear to auscultation bilaterally Abdominal: soft, non-tender, non-distended MSK: moves extremities spontaneously, lower extremities are warm and dry  Neurological: alert and oriented x3 Skin: warm and dry Psych: normal mood and affect  There were no vitals filed for this visit.  No intake or output data in the 24 hours ending 07/22/21 0704 Net IO Since Admission: No IO data has been entered for this period [07/22/21 0704]  Pertinent Labs:    Latest Ref Rng & Units 07/22/2021    3:27 AM 07/21/2021   11:33 AM 07/18/2021    1:40 AM  CBC  WBC 4.0 - 10.5 K/uL 8.2  10.8  8.8   Hemoglobin 12.0 - 15.0 g/dL 8.8  9.6  10.0   Hematocrit 36.0 - 46.0 % 27.1  30.2  30.0   Platelets 150 - 400 K/uL 221  246  231        Latest Ref Rng & Units 07/22/2021    3:27 AM 07/21/2021   11:33 AM 07/18/2021    1:40 AM  CMP  Glucose 70 - 99 mg/dL 131  138  148   BUN 8 - 23 mg/dL '10  16  14   '$ Creatinine 0.44 - 1.00 mg/dL 1.20  1.45  1.34   Sodium 135 - 145 mmol/L 145  138  141   Potassium 3.5 - 5.1 mmol/L 4.1  4.3  4.0   Chloride 98 - 111 mmol/L 116  108  110   CO2 22 - 32 mmol/L '21  20  25   '$ Calcium 8.9 - 10.3 mg/dL 8.6  8.9  8.7   Total Protein 6.5 - 8.1 g/dL  7.0    Total Bilirubin 0.3 - 1.2 mg/dL  0.8     Alkaline Phos 38 - 126 U/L  67    AST 15 - 41 U/L  23    ALT 0 - 44 U/L  10      Imaging: No results found.  Assessment/Plan:   Principal Problem:   Acute lower GI bleeding Active Problems:   Internal and external bleeding hemorrhoids   DM2 (diabetes mellitus, type 2) (HCC)   Essential hypertension   GI bleed   Patient Summary: Patricia English is a 88 y.o. with a pertinent PMH of paroxysmal atrial fibrillation not on AC, recent embolic stroke, recent GI bleed, internal and external hemorrhoids, hypertension, type 2 diabetes mellitus c/p CKD stage 3b, who presented with hematochezia and admitted on 6/7 for GI bleed on HD#1. Flex sig showed no active bleed.   Acute GI Bleed with history of recurrent GI bleed Acute on chronic anemia Patient remains hemodynamically stable. Hgb dropped from 9.6 to 8.8 overnight. Flex sigmoidoscopy showed non-bleeding non-thrombosed external and internal  hemorrhoids. Diverticula present. Diet has been resumed. GI recommends anusol suppositories QHS for 3 weeks to help with hemorrhoids. Will discontinue iron supplement as this may be contributing to constipation. Iron studies were wnl 8 months ago. -Restart Eliquis -Anusol qhs for 3 weeks (end date 6/29) -Miralax daily PRN   History of stroke, r parietal lobe Patient with recent right-sided embolic stroke May 3888. She was not anticoagulated prior to this admission due to history of recurrent GI bleed. After discussion, she was restarted on anticoagulation therapy due to concern for embolic stroke. Eliquis was subsequently discontinued during recent admission for GI bleed on 06/02.  -Continue to monitor with restarting eliquis   Atrial fibrillation  Patient with history of atrial fibrillation. She was recently restarted on her Eliquis despite history of recurrent GI bleeds due to an embolic stroke in May 2800 as noted above. Eliquis was held during her most recent admission for GI bleed x5 days ago and  she has remained off of Eliquis since. On rate control with diltiazem.  -Continue rate control with diltiazem 180 mg daily -Continue to monitor on tele   CKD stage 3b NAGMA Baseline creatinine at 1.3-1.4. Creatinine at 1.45 to 1.2 today. Bicarb at 21. GFR at 34.  -trend BMP   Hypertension Blood pressure stable at 134/77 on diltiazem at home.  -Continue diltiazem 180 mg daily -Continue to monitor   Type 2 Diabetes mellitus Well controlled. Last A1c 6.3 on 07/16/2021.  -Continue glipizide 5 mg every evening -SSI   GERD Patient has history of GERD, on protonix at home. She received one dose of IV in light of her acute GIB.  -Continue home Protonix 40 mg oral.    Coronary artery disease  HLD LHC in 2012 showed minimal LAD coronary artery disease that was treated medically. Lipid panel last month LDL 79. -Continue risk factor control of T2DM and HTN -Continue pravastatin 40 mg daily   Diet: Heart Healthy VTE: NOAC Code: Full PT/OT recs: Pending.  Dispo: Anticipated discharge to Home in 1 days pending restarting eliquis with stable Hgb.   Patricia English, D.O.  Internal Medicine Resident, PGY-1 [facility]  Pager: [phone] 7:04 AM, 07/22/2021   **Please contact the on call pager after 5 pm and on weekends at [phone].**

## 2021-07-22 NOTE — Transfer of Care (Signed)
Immediate Anesthesia Transfer of Care Note  Patient: Patricia English  Procedure(s) Performed: FLEXIBLE SIGMOIDOSCOPY  Patient Location: PACU  Anesthesia Type:MAC  Level of Consciousness: awake, alert  and oriented  Airway & Oxygen Therapy: Patient Spontanous Breathing and Patient connected to nasal cannula oxygen  Post-op Assessment: Report given to RN and Post -op Vital signs reviewed and stable  Post vital signs: Reviewed and stable  Last Vitals:  Vitals Value Taken Time  BP 99/51 07/22/21 1225  Temp    Pulse 82 07/22/21 1226  Resp 21 07/22/21 1226  SpO2 100 % 07/22/21 1226  Vitals shown include unvalidated device data.  Last Pain:  Vitals:   07/22/21 1135  TempSrc: Temporal  PainSc: 0-No pain         Complications: No notable events documented.

## 2021-07-22 NOTE — Progress Notes (Signed)
PT Cancellation Note  Patient Details Name: Nicolasa Milbrath MRN: [id number] DOB: 08-Nov-1927   Cancelled Treatment:    Reason Eval/Treat Not Completed: Patient at procedure or test/unavailable Will follow up as schedule allows.   Lou Miner, DPT  Acute Rehabilitation Services  Office: [phone]  Rudean Hitt 07/22/2021, 1:35 PM

## 2021-07-22 NOTE — Anesthesia Postprocedure Evaluation (Signed)
Anesthesia Post Note  Patient: Patricia English  Procedure(s) Performed: [place]     Patient location during evaluation: PACU Anesthesia Type: MAC Level of consciousness: awake and alert Pain management: pain level controlled Vital Signs Assessment: post-procedure vital signs reviewed and stable Respiratory status: spontaneous breathing, nonlabored ventilation, respiratory function stable and patient connected to nasal cannula oxygen Cardiovascular status: stable and blood pressure returned to baseline Postop Assessment: no apparent nausea or vomiting Anesthetic complications: no   No notable events documented.  Last Vitals:  Vitals:   07/22/21 1226 07/22/21 1242  BP: (!) 99/51 (!) 114/55  Pulse: 96 89  Resp: (!) 21 (!) 24  Temp: (!) 36.4 C   SpO2: 98% 99%    Last Pain:  Vitals:   07/22/21 1226  TempSrc:   PainSc: 0-No pain                 Tiajuana Amass

## 2021-07-22 NOTE — Progress Notes (Signed)
  Date: 07/22/2021  Patient name: Patricia English  Medical record number: [id number]  Date of birth: Apr 18, 1927   I have seen and evaluated Patricia English and discussed their care with the Residency Team. Briefly, Patricia English came in with concern for GI bleeding.  She has had episodes before.  Her H/H have dropped mildly.  She is asymptomatic.  She will go for flex sig today with GI and then possibly have further imaging.   Vitals:   07/22/21 0700 07/22/21 0738  BP: 130/79 136/75  Pulse: 72 92  Resp: (!) 33   Temp:  97.7 F (36.5 C)  SpO2: 100% 100%   Gen: Elderly woman, no distress, lying in bed Eyes: Anicteric sclerae, no conjunctival pallor HENT: Neck is supple CV: Normal rate, irregularly irregular, pulses intact radially Pulm: Breathing comfortably on room air Abd: Soft, +BS, NT MSK: normal tone and bulk for age  Assessment and Plan: I have seen and evaluated the patient as outlined above. I agree with the formulated Assessment and Plan as detailed in the residents' note, with the following changes:   1. Acute lower GI bleed - Flex Sig showed non bleeding hemorrhoids and diverticulae - Restart eliquis - benefit outweighs further risk - monitor for 24 hours to evaluate for re-bleeding - Trend H/H  2. History of embolic stroke - Okay to restart eliquis based on above - Monitor for recurrent symptoms  Other issues per Dr. Howie Ill' note.  Likely will be able to return home with [facility] services.  PT eval pending.  OT with HHOT  Sid Falcon, MD 6/8/20239:54 AM

## 2021-07-22 NOTE — Anesthesia Preprocedure Evaluation (Signed)
Anesthesia Evaluation  Patient identified by MRN, date of birth, ID band Patient awake    Airway Mallampati: II  TM Distance: >3 FB Neck ROM: Full    Dental  (+) Poor Dentition   Pulmonary neg pulmonary ROS,    Pulmonary exam normal        Cardiovascular hypertension, Pt. on medications and Pt. on home beta blockers + CAD  + dysrhythmias Atrial Fibrillation  Rhythm:Irregular Rate:Tachycardia     Neuro/Psych negative neurological ROS  negative psych ROS   GI/Hepatic Neg liver ROS, GIB   Endo/Other  diabetes, Type 2, Oral Hypoglycemic Agents  Renal/GU negative Renal ROS  negative genitourinary   Musculoskeletal negative musculoskeletal ROS (+)   Abdominal (+)  Abdomen: soft.    Peds  Hematology  (+) Blood dyscrasia, anemia ,   Anesthesia Other Findings   Reproductive/Obstetrics                             Anesthesia Physical  Anesthesia Plan  ASA: 3  Anesthesia Plan: MAC   Post-op Pain Management:    Induction: Intravenous  PONV Risk Score and Plan: 2 and Treatment may vary due to age or medical condition and Propofol infusion  Airway Management Planned: Simple Face Mask, Natural Airway and Nasal Cannula  Additional Equipment: None  Intra-op Plan:   Post-operative Plan:   Informed Consent: I have reviewed the patients History and Physical, chart, labs and discussed the procedure including the risks, benefits and alternatives for the proposed anesthesia with the patient or authorized representative who has indicated his/her understanding and acceptance.     Dental advisory given  Plan Discussed with: CRNA  Anesthesia Plan Comments:         Anesthesia Quick Evaluation

## 2021-07-22 NOTE — Telephone Encounter (Signed)
Order left on [employer] identified voice mail.

## 2021-07-22 NOTE — TOC Initial Note (Signed)
Transition of Care [facility]) - Initial/Assessment Note    Patient Details  Name: Patricia English MRN: [id number] Date of Birth: [birth year]  [facility] CM/SW Contact:    Cyndi Bender, RN Phone Number: 07/22/2021, 2:21 PM  Clinical Narrative:                  Spoke to patient regarding transition needs. Patient discharged from CIR one week ago. Patient's son lives with her. Patient recently had a stroke and was started on Eliquis. Patient is having rectal bleeding so Eliquis is on hold. Address, Phone number and PCP verified. TOC will continue to follow for needs.  Expected Discharge Plan: [employer] Barriers to Discharge: Continued Medical Work up   Patient Goals and CMS Choice Patient states their goals for this hospitalization and ongoing recovery are:: return home      Expected Discharge Plan and Services Expected Discharge Plan: [place]   Discharge Planning Services: CM Consult   Living arrangements for the past 2 months: Single Family Home                                      Prior Living Arrangements/Services Living arrangements for the past 2 months: Single Family Home Lives with:: Adult Children Patient language and need for interpreter reviewed:: Yes        Need for Family Participation in Patient Care: Yes (Comment) Care giver support system in place?: Yes (comment) Current home services: DME (walker, 3&1) Criminal Activity/Legal Involvement Pertinent to Current Situation/Hospitalization: No - Comment as needed  Activities of Daily Living Home Assistive Devices/Equipment: Eyeglasses, Environmental consultant (specify type) ADL Screening (condition at time of admission) Patient's cognitive ability adequate to safely complete daily activities?: Yes Is the patient deaf or have difficulty hearing?: No Does the patient have difficulty seeing, even when wearing glasses/contacts?: No Does the patient have difficulty  concentrating, remembering, or making decisions?: No Patient able to express need for assistance with ADLs?: Yes Does the patient have difficulty dressing or bathing?: Yes Independently performs ADLs?: No Communication: Independent Dressing (OT): Independent Grooming: Independent Feeding: Independent Bathing: Independent Toileting: Independent In/Out Bed: Independent Walks in Home: Independent with device (comment) (walker) Does the patient have difficulty walking or climbing stairs?: Yes Weakness of Legs: Left Weakness of Arms/Hands: Left  Permission Sought/Granted                  Emotional Assessment   Attitude/Demeanor/Rapport: Engaged Affect (typically observed): Accepting Orientation: : Oriented to Place, Oriented to Self, Oriented to  Time, Oriented to Situation Alcohol / Substance Use: Not Applicable Psych Involvement: No (comment)  Admission diagnosis:  GI bleed [K92.2] Acute GI bleeding [K92.2] Acute on chronic blood loss anemia [D62] Patient Active Problem List   Diagnosis Date Noted   Acute on chronic blood loss anemia 07/22/2021   GI bleed 07/21/2021   Essential hypertension 06/24/2021   Acute on chronic renal failure ([place]) 06/24/2021   Anemia 06/24/2021   Acute ischemic right MCA stroke ([place]) 06/21/2021   Acute stroke due to ischemia ([place]) 06/16/2021   Acute GI bleeding 10/28/2020   DM2 (diabetes mellitus, type 2) ([place]) 10/28/2020   Weakness 10/28/2020    Class: Acute   Acute upper GI bleed 10/28/2020   Internal and external bleeding hemorrhoids    Acute lower GI bleeding 12/08/2017   Dysphagia 08/11/2017  Class: Acute   PCP:  [name], MD Pharmacy:   [id number] [place] [id number]: [phone]: [phone]    Social Determinants of Health (SDOH) Interventions    Readmission Risk Interventions     No data to display

## 2021-07-22 NOTE — Evaluation (Signed)
Occupational Therapy Evaluation Patient Details Name: Patricia English MRN: [id number] DOB: May 21, 1927 Today's Date: 07/22/2021   History of Present Illness 88 y.o. F admitted on 07/21/21 due to acute GI Bleed and associated dizziness. PMH significant for paroxysmal atrial fibrillation not on AC, recent embolic stroke, recent GI bleed, internal and external hemorrhoids, hypertension, and type 2 diabetes mellitus.   Clinical Impression   Pt admitted for concerns listed above. PTA pt reported that she was independent with all ADL's and IADL's, including cooking and cleaning. At this time, pt demonstrates weakness, balance deficits, and decreased activity tolerance. She is requiring min guard to min A for ADL's and functional mobility. Recommending HHOT once pt is medically stable. OT will follow acutely.       Recommendations for follow up therapy are one component of a multi-disciplinary discharge planning process, led by the attending physician.  Recommendations may be updated based on patient status, additional functional criteria and insurance authorization.   Follow Up Recommendations  Home health OT    Assistance Recommended at Discharge Set up Supervision/Assistance  Patient can return home with the following A little help with walking and/or transfers;A little help with bathing/dressing/bathroom;Assistance with cooking/housework;Direct supervision/assist for medications management;Direct supervision/assist for financial management;Assist for transportation;Help with stairs or ramp for entrance    Functional Status Assessment  Patient has had a recent decline in their functional status and demonstrates the ability to make significant improvements in function in a reasonable and predictable amount of time.  Equipment Recommendations  Tub/shower bench    Recommendations for Other Services       Precautions / Restrictions Precautions Precautions: Fall Restrictions Weight Bearing  Restrictions: No      Mobility Bed Mobility Overal bed mobility: Needs Assistance Bed Mobility: Supine to Sit, Sit to Supine     Supine to sit: Min assist Sit to supine: Supervision   General bed mobility comments: Min A to pull on therapist arm to sitting    Transfers Overall transfer level: Needs assistance Equipment used: Rolling walker (2 wheels) Transfers: Sit to/from Stand Sit to Stand: Min guard           General transfer comment: Min guard to stand and steady      Balance Overall balance assessment: Needs assistance Sitting-balance support: Feet supported, Single extremity supported Sitting balance-Leahy Scale: Good     Standing balance support: Bilateral upper extremity supported, Reliant on assistive device for balance, During functional activity Standing balance-Leahy Scale: Poor                             ADL either performed or assessed with clinical judgement   ADL Overall ADL's : Needs assistance/impaired Eating/Feeding: Set up;Sitting   Grooming: Min guard;Standing   Upper Body Bathing: Min guard;Sitting   Lower Body Bathing: Minimal assistance;Sitting/lateral leans;Sit to/from stand   Upper Body Dressing : Set up;Sitting   Lower Body Dressing: Minimal assistance;Sitting/lateral leans;Sit to/from stand   Toilet Transfer: Minimal assistance;Ambulation   Toileting- Clothing Manipulation and Hygiene: Minimal assistance;Sitting/lateral lean;Sit to/from stand       Functional mobility during ADLs: Minimal assistance;Rolling walker (2 wheels);Cueing for safety General ADL Comments: Pt with balance deficits, requiring min a to steady at times in standing, especially with dynamic standing tasks     Vision Baseline Vision/History: 0 No visual deficits Ability to See in Adequate Light: 0 Adequate Patient Visual Report: No change from baseline Vision Assessment?: No apparent visual deficits  Perception     Praxis       Pertinent Vitals/Pain Pain Assessment Pain Assessment: No/denies pain     Hand Dominance Right   Extremity/Trunk Assessment Upper Extremity Assessment Upper Extremity Assessment: Overall WFL for tasks assessed   Lower Extremity Assessment Lower Extremity Assessment: Generalized weakness   Cervical / Trunk Assessment Cervical / Trunk Assessment: Kyphotic   Communication Communication Communication: No difficulties   Cognition Arousal/Alertness: Awake/alert Behavior During Therapy: WFL for tasks assessed/performed Overall Cognitive Status: Within Functional Limits for tasks assessed                                       General Comments  VSS on RA    Exercises     Shoulder Instructions      Home Living Family/patient expects to be discharged to:: Private residence Living Arrangements: Children Available Help at Discharge: Family;Available 24 hours/day Type of Home: House Home Access: Stairs to enter [employer] of Steps: 3-4 Entrance Stairs-Rails: Right Home Layout: One level     Bathroom Shower/Tub: Teacher, early years/pre: Standard Bathroom Accessibility: Yes How Accessible: Accessible via walker Home Equipment: Conservation officer, nature (2 wheels)   Additional Comments: son, Jeneen Rinks., lives with patient      Prior Functioning/Environment Prior Level of Function : Independent/Modified Independent             Mobility Comments: Uses RW ADLs Comments: Does not get in shower since CVA, sponge bathes at baseline. Son helps with IADL's.        OT Problem List: Decreased strength;Decreased activity tolerance;Impaired balance (sitting and/or standing);Decreased safety awareness      OT Treatment/Interventions: Self-care/ADL training;Therapeutic exercise;Energy conservation;DME and/or AE instruction;Therapeutic activities;Patient/family education;Balance training    OT Goals(Current goals can be found in the care plan  section) Acute Rehab OT Goals Patient Stated Goal: To go home OT Goal Formulation: With patient Time For Goal Achievement: 08/05/21 Potential to Achieve Goals: Good ADL Goals Pt Will Perform Grooming: with modified independence;standing Pt Will Perform Lower Body Bathing: with modified independence;sitting/lateral leans;sit to/from stand Pt Will Perform Lower Body Dressing: with modified independence;sitting/lateral leans;sit to/from stand Pt Will Transfer to Toilet: with modified independence;ambulating Pt Will Perform Toileting - Clothing Manipulation and hygiene: with modified independence;sitting/lateral leans;sit to/from stand  OT Frequency: Min 2X/week    Co-evaluation              AM-PAC OT "6 Clicks" Daily Activity     Outcome Measure Help from another person eating meals?: None Help from another person taking care of personal grooming?: A Little Help from another person toileting, which includes using toliet, bedpan, or urinal?: A Little Help from another person bathing (including washing, rinsing, drying)?: A Little Help from another person to put on and taking off regular upper body clothing?: A Little Help from another person to put on and taking off regular lower body clothing?: A Little 6 Click Score: 19   End of Session Equipment Utilized During Treatment: Rolling walker (2 wheels);Gait belt Nurse Communication: Mobility status  Activity Tolerance: Patient tolerated treatment well Patient left: in bed;with call bell/phone within reach  OT Visit Diagnosis: Unsteadiness on feet (R26.81);Other abnormalities of gait and mobility (R26.89);Muscle weakness (generalized) (M62.81)                Time: 6213-0865 OT Time Calculation (min): 14 min Charges:  OT General Charges $OT Visit:  1 Visit OT Evaluation $OT Eval Moderate Complexity: 1 Mod  Paden Senger H., OTR/L Acute Rehabilitation  Catrice Zuleta Elane Yolanda Bonine 07/22/2021, 10:29 AM

## 2021-07-22 NOTE — Interval H&P Note (Signed)
History and Physical Interval Note:  07/22/2021 12:01 PM  Patricia English  has presented today for surgery, with the diagnosis of Rectal bleeding.  The various methods of treatment have been discussed with the patient and family. After consideration of risks, benefits and other options for treatment, the patient has consented to  Procedure(s): FLEXIBLE SIGMOIDOSCOPY (N/A) as a surgical intervention.  The patient's history has been reviewed, patient examined, no change in status, stable for surgery.  I have reviewed the patient's chart and labs.  Questions were answered to the patient's satisfaction.     [employer]

## 2021-07-22 NOTE — Plan of Care (Signed)
  Problem: Clinical Measurements: Goal: Ability to maintain clinical measurements within normal limits will improve Outcome: Progressing Goal: Cardiovascular complication will be avoided Outcome: Progressing   Problem: Nutrition: Goal: Adequate nutrition will be maintained Outcome: Progressing

## 2021-07-22 NOTE — Op Note (Signed)
[facility] Patient Name: Patricia English Procedure Date : 07/22/2021 MRN: [id number] Attending MD: Justice Britain , MD Date of Birth: 01-25-1928 CSN: [id number] Age: 88 Admit Type: Outpatient Procedure:                Flexible Sigmoidoscopy Indications:              Rectal hemorrhage, Hematochezia, Need for restart                            of anticoagulation due to history of CVA Providers:                Justice Britain, MD, Grace Isaac, RN, William Dalton, Technician, Darliss Cheney, Technician Referring MD:             [name], MD, [facility] Medicines:                Monitored Anesthesia Care Complications:            No immediate complications. Estimated Blood Loss:     Estimated blood loss: none. Procedure:                Pre-Anesthesia Assessment:                           - Prior to the procedure, a History and Physical                            was performed, and patient medications and                            allergies were reviewed. The patient's tolerance of                            previous anesthesia was also reviewed. The risks                            and benefits of the procedure and the sedation                            options and risks were discussed with the patient.                            All questions were answered, and informed consent                            was obtained. Prior Anticoagulants: The patient has                            taken Eliquis (apixaban), last dose was 7 days                            prior to procedure. ASA Grade Assessment: III - A  patient with severe systemic disease. After                            reviewing the risks and benefits, the patient was                            deemed in satisfactory condition to undergo the                            procedure.                           After obtaining informed consent, the  scope was                            passed under direct vision. The [id number]                            ([id number]) Olympus endoscope was introduced through                            the anus and advanced to the the descending colon.                            The flexible sigmoidoscopy was accomplished without                            difficulty. The patient tolerated the procedure.                            The quality of the bowel preparation was fair. Scope In: 12:11:34 PM Scope Out: 12:17:50 PM Total Procedure Duration: 0 hours 6 minutes 16 seconds  Findings:      The digital rectal exam findings include hemorrhoids. Pertinent       negatives include no palpable rectal lesions.      Many small and large-mouthed diverticula were found in the recto-sigmoid       colon, sigmoid colon and descending colon.      A large amount of semi-liquid semi-solid stool was found in the entire       colon, interfering with visualization.      Non-bleeding non-thrombosed external and internal hemorrhoids were found       during retroflexion, during perianal exam and during digital exam. The       hemorrhoids were Grade II (internal hemorrhoids that prolapse but reduce       spontaneously). Impression:               - Preparation of the colon was fair.                           - Hemorrhoids found on digital rectal exam.                           - Diverticulosis in the recto-sigmoid colon, in the  sigmoid colon and in the descending colon.                           - Stool in the entire examined colon.                           - Non-bleeding non-thrombosed external and internal                            hemorrhoids. Recommendation:           - The patient will be observed post-procedure,                            until all discharge criteria are met.                           - Return patient to hospital ward for ongoing care.                           - Resume  previous diet.                           - Anusol suppositories QHS for 3 weeks in effort of                            trying to help hermorrhoids. Will need to discuss                            with primary GI, whether consideration of                            hemorrhoid banding could be of assistance (I think                            possibly) but with anticoagulation needs, would                            need to discuss further with Cardiology if this is                            pursued.                           - Miralax daily PRN to have a bowel movement.                           - Initiation of Fibercon daily.                           - In regards to timing of Eliquis, most recent                            bleeding seems to have been low in volume and there  is no overt bleeding, if necessary, consider                            restart of Eliquis and monitor patient in the                            hospital tomorrow.                           - IV Iron infusion may be reasonable to consider,                            since she had issues with oral iron causing her to                            get constipated.                           - The findings and recommendations were discussed                            with the patient.                           - The findings and recommendations were discussed                            with the patient's family. Procedure Code(s):        --- Professional ---                           [id number], Sigmoidoscopy, flexible; diagnostic,                            including collection of specimen(s) by brushing or                            washing, when performed (separate procedure) Diagnosis Code(s):        --- Professional ---                           K64.1, Second degree hemorrhoids                           K62.5, Hemorrhage of anus and rectum                           K92.1, Melena (includes  Hematochezia)                           K57.30, Diverticulosis of large intestine without                            perforation or abscess without bleeding CPT copyright 2019 American Medical Association. All rights reserved. The codes documented in this report are preliminary and upon coder review may  be  revised to meet current compliance requirements. Justice Britain, MD 07/22/2021 12:45:35 PM Number of Addenda: 0

## 2021-07-22 NOTE — ED Notes (Signed)
Pt A&O x4 in the room. Called out to use the rest room. Pt assisted to bedside commode. Brown watery stool. No signs of blood. Pt denies pain or feeling if dizziness or weakness at this time. Updated on plan of care.

## 2021-07-23 ENCOUNTER — Encounter (HOSPITAL_COMMUNITY): Payer: Self-pay | Admitting: Gastroenterology

## 2021-07-23 ENCOUNTER — Other Ambulatory Visit: Payer: Self-pay

## 2021-07-23 DIAGNOSIS — K649 Unspecified hemorrhoids: Secondary | ICD-10-CM | POA: Diagnosis not present

## 2021-07-23 DIAGNOSIS — K862 Cyst of pancreas: Secondary | ICD-10-CM | POA: Diagnosis not present

## 2021-07-23 DIAGNOSIS — K922 Gastrointestinal hemorrhage, unspecified: Secondary | ICD-10-CM | POA: Diagnosis not present

## 2021-07-23 DIAGNOSIS — K625 Hemorrhage of anus and rectum: Secondary | ICD-10-CM | POA: Diagnosis not present

## 2021-07-23 DIAGNOSIS — D136 Benign neoplasm of pancreas: Secondary | ICD-10-CM

## 2021-07-23 LAB — CBC
HCT: 28.4 % — ABNORMAL LOW (ref 36.0–46.0)
Hemoglobin: 9.1 g/dL — ABNORMAL LOW (ref 12.0–15.0)
MCH: 29.8 pg (ref 26.0–34.0)
MCHC: 32 g/dL (ref 30.0–36.0)
MCV: 93.1 fL (ref 80.0–100.0)
Platelets: 251 10*3/uL (ref 150–400)
RBC: 3.05 MIL/uL — ABNORMAL LOW (ref 3.87–5.11)
RDW: 13.9 % (ref 11.5–15.5)
WBC: 10 10*3/uL (ref 4.0–10.5)
nRBC: 0 % (ref 0.0–0.2)

## 2021-07-23 LAB — BASIC METABOLIC PANEL
Anion gap: 4 — ABNORMAL LOW (ref 5–15)
BUN: 9 mg/dL (ref 8–23)
CO2: 24 mmol/L (ref 22–32)
Calcium: 8.6 mg/dL — ABNORMAL LOW (ref 8.9–10.3)
Chloride: 112 mmol/L — ABNORMAL HIGH (ref 98–111)
Creatinine, Ser: 1.12 mg/dL — ABNORMAL HIGH (ref 0.44–1.00)
GFR, Estimated: 46 mL/min — ABNORMAL LOW (ref >60)
Glucose, Bld: 134 mg/dL — ABNORMAL HIGH (ref 70–99)
Potassium: 4.1 mmol/L (ref 3.5–5.1)
Sodium: 140 mmol/L (ref 135–145)

## 2021-07-23 LAB — GLUCOSE, CAPILLARY
Glucose-Capillary: 152 mg/dL — ABNORMAL HIGH (ref 70–99)
Glucose-Capillary: 71 mg/dL (ref 70–99)

## 2021-07-23 MED ORDER — APIXABAN 5 MG PO TABS: 5.0000 mg | ORAL_TABLET | Freq: Two times a day (BID) | ORAL | 0 refills | Status: DC

## 2021-07-23 MED ORDER — HYDROCORTISONE ACETATE 25 MG RE SUPP
25.0000 mg | Freq: Every day | RECTAL | 0 refills | Status: DC
Start: 2021-07-23 — End: 2021-07-31

## 2021-07-23 MED ORDER — FIBERCON 625 MG PO TABS: 625.0000 mg | ORAL_TABLET | Freq: Every day | ORAL | 0 refills | Status: AC

## 2021-07-23 NOTE — Progress Notes (Signed)
Inpatient Diabetes Program Recommendations  AACE/ADA: New Consensus Statement on Inpatient Glycemic Control (2015)  Target Ranges:  Prepandial:   less than 140 mg/dL      Peak postprandial:   less than 180 mg/dL (1-2 hours)      Critically ill patients:  140 - 180 mg/dL   Lab Results  Component Value Date   GLUCAP 152 (H) 07/23/2021   HGBA1C 6.3 (H) 07/16/2021    Review of Glycemic Control  Latest Reference Range & Units 07/22/21 13:59 07/22/21 16:10 07/22/21 20:54 07/22/21 21:21 07/23/21 08:13  Glucose-Capillary 70 - 99 mg/dL 75 130 (H) 63 (L) 141 (H) 152 (H)  (H): Data is abnormally high (L): Data is abnormally low Diabetes history: Type 2 DM Outpatient Diabetes medications: Glipizide 5 mg QPM Current orders for Inpatient glycemic control: Glipizide 5 mg QPM, Novolog 0-15 units TID  Inpatient Diabetes Program Recommendations:    Noted hypoglycemia of 63 mg/dL following Glipizide and Novolog. With renal status and age would recommend discontinuation of Glipizide.  Thanks, Bronson Curb, MSN, RNC-OB Diabetes Coordinator [phone] (8a-5p)

## 2021-07-23 NOTE — Discharge Instructions (Signed)

## 2021-07-23 NOTE — Progress Notes (Signed)
[facility] Progress Note  CC:  Rectal bleeding  Subjective:  No further bleeding.  Eating well.  Ready to go home.  Objective:  Vital signs in last 24 hours: Temp:  [97.6 F (36.4 C)-98 F (36.7 C)] 97.8 F (36.6 C) (06/09 1212) Pulse Rate:  [78-88] 78 (06/09 1212) Resp:  [16-24] 24 (06/09 1212) BP: (106-137)/(54-89) 122/54 (06/09 1212) SpO2:  [97 %-100 %] 99 % (06/09 1212) Last BM Date : 07/22/21 General:  Alert, Well-developed, in NAD Heart:  Irregularly irregular. Pulm:  CTAB.  No W/R/R. Abdomen:  Soft, non-distended.  BS present.  Non-tender. Extremities:  Without edema. Neurologic:  Alert and oriented x 4;  grossly normal neurologically. Psych:  Alert and cooperative. Normal mood and affect.  Intake/Output from previous day: 06/08 0701 - 06/09 0700 In: 434.9 [P.O.:240; I.V.:194.9] Out: 600 [Urine:600] Intake/Output this shift: Total I/O In: -  Out: 900 [Urine:900]  Lab Results: Recent Labs    07/21/21 1133 07/22/21 0327 07/23/21 0417  WBC 10.8* 8.2 10.0  HGB 9.6* 8.8* 9.1*  HCT 30.2* 27.1* 28.4*  PLT 246 221 251   BMET Recent Labs    07/21/21 1133 07/22/21 0327 07/23/21 0417  NA 138 145 140  K 4.3 4.1 4.1  CL 108 116* 112*  CO2 20* 21* 24  GLUCOSE 138* 131* 134*  BUN '16 10 9  '$ CREATININE 1.45* 1.20* 1.12*  CALCIUM 8.9 8.6* 8.6*   LFT Recent Labs    07/21/21 1133  PROT 7.0  ALBUMIN 3.5  AST 23  ALT 10  ALKPHOS 67  BILITOT 0.8   PT/INR Recent Labs    07/21/21 1405  LABPROT 14.7  INR 1.2   MR ABDOMEN MRCP W WO CONTAST  Result Date: 07/23/2021 CLINICAL DATA:  Pancreatic cyst EXAM: MRI ABDOMEN WITHOUT AND WITH CONTRAST (INCLUDING MRCP) TECHNIQUE: Multiplanar multisequence MR imaging of the abdomen was performed both before and after the administration of intravenous contrast. Heavily T2-weighted images of the biliary and pancreatic ducts were obtained, and three-dimensional MRCP images were rendered by post  processing. CONTRAST:  7.73m GADAVIST GADOBUTROL 1 MMOL/ML IV SOLN COMPARISON:  CT abdomen pelvis, 01/19/2021 FINDINGS: Lower chest: No acute findings.  Cardiomegaly. Hepatobiliary: No mass or other parenchymal abnormality identified. Numerous gallstones contracted in the gallbladder. No biliary ductal dilatation. Pancreas: There is a multiseptated microcystic lesion of the pancreatic neck measuring 2.6 x 2.5 cm, with atrophy of the pancreatic parenchyma and dilatation of the pancreatic duct distally measuring up to 0.5 cm (series 4, image 22). Minimal associated septal enhancement without nodular solid component. No solid mass, inflammatory changes, or other parenchymal abnormality identified. Spleen:  Within normal limits in size and appearance. Adrenals/Urinary Tract: Normal adrenal glands. Incidental, benign hemorrhagic or proteinaceous cyst of the posterior midportion of the left kidney, for which no further follow-up or characterization is required (series 19, image 51). No solid renal masses or suspicious contrast enhancement identified. No evidence of hydronephrosis. Stomach/Bowel: Colonic diverticulosis. Visualized portions within the abdomen are otherwise unremarkable. Vascular/Lymphatic: No pathologically enlarged lymph nodes identified. No abdominal aortic aneurysm demonstrated. Other:  None. Musculoskeletal: No suspicious osseous lesions identified. IMPRESSION: 1. Multiseptated microcystic lesion of the pancreatic neck measuring 2.6 x 2.5 cm, with atrophy of the pancreatic parenchyma and dilatation of the pancreatic duct distally. Minimal associated septal enhancement without nodular solid component. Findings are consistent with serous cystadenoma. 2. Cholelithiasis. 3. Colonic diverticulosis. Electronically Signed   By: ADelanna AhmadiM.D.   On: 07/23/2021 08:28  Assessment / Plan: Blood mixed in brown stool.  Rectal bleeding w dark and red blood last week, may have been diverticular vs  hemorrhoidal.  LGIB in September 2022 that was possibly diverticular vs hemorrhoidal vs from inflamed polyp.  Hemorrhoidal banding in 2019.  Amount of blood she passed overnight does not sound diverticular in volume.  Suspect there could be hemorrhoidal bleeding at play as she has had banding of hemorrhoids in the past and on DRE has large but not thrombosed hemorrhoids with a shallow ulcer covered with dark red eschar and another area of hemorrhoid with loss of pigmentation.  Flexible sigmoidoscopy 6/8 with internal and external hemorrhoids.  This is the cause of her bleeding.   [place] anemia.  Hgb 9.6 relatively stable c/w 5 d ago but down from ~ 13.5 (~ 4 gm drop) 1 month ago.  She was not technically iron deficient last week during her admission but she got started on twice daily oral iron and I suspect this has led to the constipation.   Indeterminate 2.4 x 2 cm hypodensity at neck of pancreas on CT 01/2021.  Radiologist reading report recommended nonemergent pancreatic protocol MRI which was never pursued.  LFTs are normal and no abd pain, anorexia, wt loss.  MRI abdomen cyst that is consistent with serous cystadenoma.  -We will arrange follow-up with Dr. Carlean Purl.  Per Dr. Rush Landmark, consider repeat MRI abdomen in 6 months for serous cystadenoma -Anusol suppositories nightly x3 weeks for her hemorrhoids.  Can have discussion with Dr. Carlean Purl at follow-up in regards to whether or not banding could be performed with her anticoagulation needs. -MiraLAX daily as needed and FiberCon daily.    LOS: 1 day   Laban Emperor. Shamari Lofquist  07/23/2021, 2:49 PM

## 2021-07-23 NOTE — Progress Notes (Signed)
   07/23/21 1832  AVS Discharge Documentation  AVS Discharge Instructions Including Medications Provided to patient/caregiver;Placed in discharge packet for receiving facility  Name of Person Receiving AVS Discharge Instructions Including Medications Patricia English  Name of Clinician That Reviewed AVS Discharge Instructions Including Medications Venida Jarvis, RN

## 2021-07-23 NOTE — Evaluation (Signed)
Physical Therapy Evaluation Patient Details Name: Patricia English MRN: [id number] DOB: [birth year] Today's Date: 07/23/2021  History of Present Illness  88 y.o. F admitted on 07/21/21 due to acute GI Bleed and associated dizziness. PMH significant for paroxysmal atrial fibrillation not on AC, recent embolic stroke, recent GI bleed, internal and external hemorrhoids, hypertension, and type 2 diabetes mellitus.   Clinical Impression  Pt presents with condition above and deficits mentioned below, see PT Problem List. Prior to her recent CVA, she was IND without AD, but has recently been mod I with a RW and needing min guard for stairs just for safety. Pt's son lives with her in a 1-level house with 3-4 STE. Currently, pt is displaying some mild L leg residual weakness from her prior CVA along with deficits in activity tolerance and balance. Pt is requiring min guard-supervision for safety with all functional mobility with RW at this time. Pt would benefit from further acute PT services and continuing with HHPT at d/c to address her deficits.       Recommendations for follow up therapy are one component of a multi-disciplinary discharge planning process, led by the attending physician.  Recommendations may be updated based on patient status, additional functional criteria and insurance authorization.  Follow Up Recommendations Home health PT    Assistance Recommended at Discharge Intermittent Supervision/Assistance  Patient can return home with the following  A little help with bathing/dressing/bathroom;Assistance with cooking/housework;Assist for transportation;Help with stairs or ramp for entrance    Equipment Recommendations Other (comment) (tub bench/shower chair)  Recommendations for Other Services       Functional Status Assessment Patient has had a recent decline in their functional status and demonstrates the ability to make significant improvements in function in a reasonable and  predictable amount of time.     Precautions / Restrictions Precautions Precautions: Fall Precaution Comments: watch HR; urinary incontinence Restrictions Weight Bearing Restrictions: No      Mobility  Bed Mobility Overal bed mobility: Needs Assistance Bed Mobility: Supine to Sit     Supine to sit: Supervision, HOB elevated     General bed mobility comments: Supervision for safety    Transfers Overall transfer level: Needs assistance Equipment used: Rolling walker (2 wheels) Transfers: Sit to/from Stand Sit to Stand: Min guard           General transfer comment: Min guard for safety coming to stand from EOB and commode    Ambulation/Gait Ambulation/Gait assistance: Min guard Gait Distance (Feet): 140 Feet (x2 bouts of ~20 ft > ~140 ft) Assistive device: Rolling walker (2 wheels) Gait Pattern/deviations: Step-through pattern, Decreased stride length, Trunk flexed Gait velocity: reduced Gait velocity interpretation: <1.8 ft/sec, indicate of risk for recurrent falls   General Gait Details: Pt with slow, but mostly steady gait with no LOB, min guard for safety  Stairs            Wheelchair Mobility    Modified Rankin (Stroke Patients Only)       Balance Overall balance assessment: Needs assistance Sitting-balance support: Feet supported, No upper extremity supported Sitting balance-Leahy Scale: Good Sitting balance - Comments: Able to wash upper leg without assistance   Standing balance support: Bilateral upper extremity supported, During functional activity, No upper extremity supported Standing balance-Leahy Scale: Fair Standing balance comment: Able to stand statically without UE support, mild trunk sway. Benefits from RW with gait  Pertinent Vitals/Pain Pain Assessment Pain Assessment: No/denies pain    Home Living Family/patient expects to be discharged to:: Private residence Living Arrangements:  Children Available Help at Discharge: Family;Available 24 hours/day Type of Home: House Home Access: Stairs to enter Entrance Stairs-Rails: Right Entrance Stairs-Number of Steps: 3-4   Home Layout: One level Home Equipment: Conservation officer, nature (2 wheels) Additional Comments: son, Jeneen Rinks., lives with patient    Prior Function Prior Level of Function : Independent/Modified Independent             Mobility Comments: Uses RW, mod I since CVA. Prior to recent CVA, she was IND without AD. Son provides guarding on stairs for safety. No recent falls. ADLs Comments: Does not get in shower since CVA, sponge bathes at baseline. Son helps with IADL's.     Hand Dominance   Dominant Hand: Right    Extremity/Trunk Assessment   Upper Extremity Assessment Upper Extremity Assessment: Defer to OT evaluation    Lower Extremity Assessment Lower Extremity Assessment: LLE deficits/detail LLE Deficits / Details: Mild weakness, grossly 4+, compared to R (MMT of 5 grossly), which pt reports is residual from recent CVA; denies numbness/tingling LLE Sensation: WNL LLE Coordination: WNL    Cervical / Trunk Assessment Cervical / Trunk Assessment: Kyphotic  Communication   Communication: No difficulties  Cognition Arousal/Alertness: Awake/alert Behavior During Therapy: WFL for tasks assessed/performed Overall Cognitive Status: Within Functional Limits for tasks assessed                                 General Comments: Understands her limitations        General Comments General comments (skin integrity, edema, etc.): HR up to 141    Exercises     Assessment/Plan    PT Assessment Patient needs continued PT services  PT Problem List Decreased strength;Decreased activity tolerance;Decreased mobility;Decreased balance       PT Treatment Interventions DME instruction;Gait training;Stair training;Functional mobility training;Therapeutic activities;Therapeutic exercise;Balance  training;Neuromuscular re-education;Patient/family education    PT Goals (Current goals can be found in the Care Plan section)  Acute Rehab PT Goals Patient Stated Goal: to return home with [facility] therapy PT Goal Formulation: With patient Time For Goal Achievement: 08/06/21 Potential to Achieve Goals: Good    Frequency Min 3X/week     Co-evaluation               AM-PAC PT "6 Clicks" Mobility  Outcome Measure Help needed turning from your back to your side while in a flat bed without using bedrails?: A Little Help needed moving from lying on your back to sitting on the side of a flat bed without using bedrails?: A Little Help needed moving to and from a bed to a chair (including a wheelchair)?: A Little Help needed standing up from a chair using your arms (e.g., wheelchair or bedside chair)?: A Little Help needed to walk in hospital room?: A Little Help needed climbing 3-5 steps with a railing? : A Little 6 Click Score: 18    End of Session Equipment Utilized During Treatment: Gait belt Activity Tolerance: Patient tolerated treatment well Patient left: in chair;with call bell/phone within reach Nurse Communication: Mobility status;Other (comment) (HR) PT Visit Diagnosis: Unsteadiness on feet (R26.81);Other abnormalities of gait and mobility (R26.89);Muscle weakness (generalized) (M62.81);Difficulty in walking, not elsewhere classified (R26.2);Other symptoms and signs involving the nervous system (O37.858)    Time: 8502-7741 PT Time Calculation (min) (ACUTE ONLY): 25  min   Charges:   PT Evaluation $PT Eval Moderate Complexity: 1 Mod PT Treatments $Gait Training: 8-22 mins        Moishe Spice, PT, DPT Acute Rehabilitation Services  Office: [place] 07/23/2021, 9:03 AM

## 2021-07-23 NOTE — Progress Notes (Signed)
HYPOGLYCEMIC EVENT  Time: 2054 Blood Glucose: 63  Symptoms: Drowsiness  Treatment: 12.5g D50W IV  Time: 2121 Blood Glucose: 141  Response: Blood glucose returned to >80  Potential cause: Patient to have nothing to eat or drink for at least 4 hours prior to MRI  MD paged: Roxanne Mins MRI to have testing done more timely due to patient acuity. MRI completed and patient able to eat.

## 2021-07-23 NOTE — Discharge Summary (Signed)
Name: Patricia English MRN: [id number] DOB: 1927/03/23 88 y.o. PCP: [name], MD  Date of Admission: 07/21/2021 11:13 AM Date of Discharge: 07/23/21 Attending Physician: Sid Falcon, MD  Discharge Diagnosis: Principal Problem:   Acute lower GI bleeding Active Problems:   Internal and external bleeding hemorrhoids   DM2 (diabetes mellitus, type 2) ([place])   Essential hypertension   GI bleed   Acute on chronic blood loss anemia   Discharge Medications: Allergies as of 07/23/2021       Reactions   Lisinopril Swelling, Other (See Comments)   This caused laryngeal edema        Medication List     STOP taking these medications    amoxicillin-clavulanate 500-125 MG tablet Commonly known as: AUGMENTIN   ferrous sulfate 325 (65 FE) MG tablet   glipiZIDE 5 MG 24 hr tablet Commonly known as: GLUCOTROL XL       TAKE these medications    apixaban 5 MG Tabs tablet Commonly known as: ELIQUIS Take 1 tablet (5 mg total) by mouth 2 (two) times daily.   benzonatate 100 MG capsule Commonly known as: Tessalon Perles Take 2 capsules (200 mg total) by mouth 3 (three) times daily as needed for cough.   Cholecalciferol 25 MCG (1000 UT) tablet Take 1,000 Units by mouth daily.   diltiazem 180 MG 24 hr capsule Commonly known as: DILACOR XR Take 1 capsule (180 mg total) by mouth daily. What changed: when to take this   FiberCon 625 MG tablet Generic drug: polycarbophil Take 1 tablet (625 mg total) by mouth daily.   furosemide 40 MG tablet Commonly known as: LASIX Take 1 tablet (40 mg total) by mouth every Monday, Wednesday, and Friday.   hydrocortisone 25 MG suppository Commonly known as: ANUSOL-HC Place 1 suppository (25 mg total) rectally at bedtime for 20 days.   metoprolol tartrate 25 MG tablet Commonly known as: LOPRESSOR Take 0.5 tablets (12.5 mg total) by mouth 2 (two) times daily.   pantoprazole 40 MG tablet Commonly known as: PROTONIX Take 1 tablet  (40 mg total) by mouth 2 (two) times daily. What changed: when to take this   polyethylene glycol 17 g packet Commonly known as: MIRALAX / GLYCOLAX Take 17 g by mouth daily as needed for moderate constipation. What changed: Another medication with the same name was removed. Continue taking this medication, and follow the directions you see here.   pravastatin 40 MG tablet Commonly known as: PRAVACHOL Take 1 tablet (40 mg total) by mouth daily.        Disposition and follow-up:   [name] was discharged from [facility] in Stable condition.  At the hospital follow up visit please address:  Acute GI bleed: she has been restarted on her Eliquis and will need close follow-up for recurrent bleeds.   Pancreatic serous cystadenoma: Follow-up with GI to monitor Internal and external hemorrhoids: Follow-up with GI for possible banding, Dr. Carlean Purl  Labs / imaging needed at time of follow-up: CBC  Pending labs/ test needing follow-up: N/A  Follow-up Appointments:  Follow-up Information     [facility] Follow up.   Why: [phone] The home health agency will contact you for the next home visit.                Hospital Course by problem list:  Acute GI Bleed History of GI Bleed Patient presented with 2 episodes of painless GI bleed. She was not on anticoagulation therapy since  her recent hospitalization (05/31-06/04) for GI bleed. She was previously on Eliquis since her embolic stroke in May 2992 in the setting of atrial fibrillation. She was not anticoagulated prior to her stroke due to recurrent GI bleeds in the past on warfarin. Patient remained stable during this admission. She is s/p flex sig that showed non-bleeding non-thrombosed external and internal hemorrhoids as well as diverticulosis. She was restarted on her Eliquis with no recurrent episodes of bleeding.   History of embolic stroke Patient has history of atrial fibrillation  however was not anticoagulation due to history of recurrent GI bleed. She was recently started on Eliquis in May 4268 since her embolic stroke however anticoagulation was discontinued due to recurrent GI bleed. She has residual left-sided deficit. She remained stable during this admission and was restarted on Eliquis due to risk of stroke and no evidence of active bleeding noted on flex sig.   Pancreatic serous cystadenoma MRCP was performed during this admission which showed serous cystadenoma 2.6 x 2.5 cm in size, at the pancreatic neck. Recommend follow-up with GI to monitor outpatient.   Atrial fibrillation She was rate controlled on diltiazem during this admission. Eliquis was restarted as noted above.   Chronic kidney disease, stage 3b Kidney function was monitored and remained stable during this admission.   Hypertension Blood pressure remained stable during this admission on diltiazem.   Type 2 diabetes mellitus Stable during this admission. She was continued on her evening glipizide.   GERD Continued on home protonix.   Coronary artery disease  HLD LHC in 2012 showed minimal LAD coronary artery disease that was treated medically. No chest pain during this hospitalization. Lipid panel last month LDL 79. She was continued on her pravastatin.   Discharge Subjective:  Patient is feeling well this morning. She had one bowel movement yesterday that was non-bloody. She does note some reflux symptoms/feeling full which she relates to eating late last evening after her procedure. She otherwise denies any chest pain, abdominal pain, recurrent bleeding, dizziness, or weakness. She has been seen by PT/OT which she tolerated well.   Discharge Exam:   BP (!) 122/54 (BP Location: Left Arm)   Pulse 78   Temp 97.8 F (36.6 C) (Oral)   Resp (!) 24   Ht '5\' 9"'$  (1.753 m)   Wt 75.2 kg   SpO2 99%   BMI 24.48 kg/m   Physical Exam:  Physical Exam  Constitutional:  Alert and oriented x3. In  no acute distress.  Cardiovascular:  Regular rate and rhythm. No lower extremity swelling.  Respiratory:  No increased work of breathing. Lungs are clear to auscultation.  Abdominal:  no abdominal tenderness to palpation. Normal bowel sounds present.  Extremities:  No asymmetry noted on exam.  Skin:  No obvious lesion noted Psych:  normal mood and behavior.   Pertinent Labs, Studies, and Procedures:     Latest Ref Rng & Units 07/23/2021    4:17 AM 07/22/2021    3:27 AM 07/21/2021   11:33 AM  CBC  WBC 4.0 - 10.5 K/uL 10.0  8.2  10.8   Hemoglobin 12.0 - 15.0 g/dL 9.1  8.8  9.6   Hematocrit 36.0 - 46.0 % 28.4  27.1  30.2   Platelets 150 - 400 K/uL 251  221  246        Latest Ref Rng & Units 07/23/2021    4:17 AM 07/22/2021    3:27 AM 07/21/2021   11:33 AM  CMP  Glucose  70 - 99 mg/dL 134  131  138   BUN 8 - 23 mg/dL '9  10  16   '$ Creatinine 0.44 - 1.00 mg/dL 1.12  1.20  1.45   Sodium 135 - 145 mmol/L 140  145  138   Potassium 3.5 - 5.1 mmol/L 4.1  4.1  4.3   Chloride 98 - 111 mmol/L 112  116  108   CO2 22 - 32 mmol/L '24  21  20   '$ Calcium 8.9 - 10.3 mg/dL 8.6  8.6  8.9   Total Protein 6.5 - 8.1 g/dL   7.0   Total Bilirubin 0.3 - 1.2 mg/dL   0.8   Alkaline Phos 38 - 126 U/L   67   AST 15 - 41 U/L   23   ALT 0 - 44 U/L   10     Imaging:  MRCP 07/22/2021: IMPRESSION: 1. Multiseptated microcystic lesion of the pancreatic neck measuring 2.6 x 2.5 cm, with atrophy of the pancreatic parenchyma and dilatation of the pancreatic duct distally. Minimal associated septal enhancement without nodular solid component. Findings are consistent with serous cystadenoma. 2. Cholelithiasis. 3. Colonic diverticulosis.  Procedure:  Flexible Sigmoidoscopy 07/22/2021: Impression:  - Preparation of the colon was fair. - Hemorrhoids found on digital rectal exam. - Diverticulosis in the recto-sigmoid colon, in the sigmoid colon and in the descending colon. - Stool in the entire examined colon. -  Non-bleeding non-thrombosed external and internal hemorrhoids.   Discharge Instructions: Discharge Instructions     Diet - low sodium heart healthy   Complete by: As directed    Discharge instructions   Complete by: As directed    Ms. Victorine, Mcnee were recently admitted to [facility] for Bleeding from hemorrhoid. Your blood count was stable here and the eliquis was restarted. Please follow-up with your GI doctor to talk about procedure to get rid of hemorrhoids. Imaging of your belly was done because a abnormal spot was seen in 2022. The MRI showed something that is not cancer on your pancrease. This is nothing to worry about, but likely GI will want to repeat imaging at some point.  Continue taking your home medications with the following changes  Start taking 1. Anusol suppository 2. Fibercon 3. Eliquis  You should seek further medical care if develop worsening bleeding in your stools.  We recommend that you see your primary care doctor in about a week to make sure that you continue to improve. We are so glad that you are feeling better.  Sincerely, Dashon Mcintire, DO   Increase activity slowly   Complete by: As directed        Signed: Daleen Bo. Brittay Mogle, D.O.  Internal Medicine Resident, PGY-1 [facility]  Pager: [phone] 2:14 PM, 07/23/2021   **Please contact the on call pager after 5 pm and on weekends at [phone].**

## 2021-07-23 NOTE — TOC Transition Note (Signed)
Transition of Care [facility]) - CM/SW Discharge Note   Patient Details  Name: Patricia English MRN: [id number] Date of Birth: 1928-02-02  [facility] CM/SW Contact:  Pollie Friar, RN Phone Number: 07/23/2021, 1:41 PM   Clinical Narrative:    Patient is discharging home with resumption of home health services through [facility]. Ashely with [facility] aware of d/c home and new home health orders.  Recommendations for a tub bench. Pt will have her family purchase a tub bench outside the hospital setting. Pt has supervision at home and transportation to home.   Final next level of care: Home w Home Health Services Barriers to Discharge: No Barriers Identified   Patient Goals and CMS Choice Patient states their goals for this hospitalization and ongoing recovery are:: return home   Choice offered to / list presented to : Patient  Discharge Placement                       Discharge Plan and Services   Discharge Planning Services: CM Consult                                 Social Determinants of Health (SDOH) Interventions     Readmission Risk Interventions     No data to display

## 2021-07-26 ENCOUNTER — Telehealth: Payer: Self-pay

## 2021-07-26 NOTE — Telephone Encounter (Signed)
Cecelia, PT from [facility] called requesting  HHPT 2wk3, 1wk2 for strengthening, gait and balance training. Orders approved and given,

## 2021-07-27 ENCOUNTER — Telehealth: Payer: Self-pay

## 2021-07-27 NOTE — Telephone Encounter (Signed)
Mickel Baas from [facility] called requesting verbal orders for skilled nursing for medication review. Orders approved and given.

## 2021-07-29 ENCOUNTER — Other Ambulatory Visit: Payer: Self-pay

## 2021-07-29 ENCOUNTER — Observation Stay (HOSPITAL_COMMUNITY)
Admission: EM | Admit: 2021-07-29 | Discharge: 2021-07-31 | Disposition: A | Discharge status: Home Health | Payer: Medicare Other | Attending: Internal Medicine | Admitting: Internal Medicine

## 2021-07-29 ENCOUNTER — Encounter (HOSPITAL_COMMUNITY): Payer: Self-pay

## 2021-07-29 DIAGNOSIS — K5730 Diverticulosis of large intestine without perforation or abscess without bleeding: Secondary | ICD-10-CM | POA: Insufficient documentation

## 2021-07-29 DIAGNOSIS — I2510 Atherosclerotic heart disease of native coronary artery without angina pectoris: Secondary | ICD-10-CM | POA: Diagnosis not present

## 2021-07-29 DIAGNOSIS — K869 Disease of pancreas, unspecified: Secondary | ICD-10-CM

## 2021-07-29 DIAGNOSIS — N1832 Chronic kidney disease, stage 3b: Secondary | ICD-10-CM | POA: Insufficient documentation

## 2021-07-29 DIAGNOSIS — E785 Hyperlipidemia, unspecified: Secondary | ICD-10-CM | POA: Insufficient documentation

## 2021-07-29 DIAGNOSIS — Z7901 Long term (current) use of anticoagulants: Secondary | ICD-10-CM | POA: Insufficient documentation

## 2021-07-29 DIAGNOSIS — K625 Hemorrhage of anus and rectum: Secondary | ICD-10-CM

## 2021-07-29 DIAGNOSIS — I4891 Unspecified atrial fibrillation: Secondary | ICD-10-CM | POA: Diagnosis not present

## 2021-07-29 DIAGNOSIS — K644 Residual hemorrhoidal skin tags: Secondary | ICD-10-CM | POA: Insufficient documentation

## 2021-07-29 DIAGNOSIS — I129 Hypertensive chronic kidney disease with stage 1 through stage 4 chronic kidney disease, or unspecified chronic kidney disease: Secondary | ICD-10-CM | POA: Diagnosis not present

## 2021-07-29 DIAGNOSIS — E1122 Type 2 diabetes mellitus with diabetic chronic kidney disease: Secondary | ICD-10-CM | POA: Diagnosis not present

## 2021-07-29 DIAGNOSIS — K922 Gastrointestinal hemorrhage, unspecified: Secondary | ICD-10-CM | POA: Diagnosis present

## 2021-07-29 DIAGNOSIS — I10 Essential (primary) hypertension: Secondary | ICD-10-CM | POA: Diagnosis present

## 2021-07-29 DIAGNOSIS — K642 Third degree hemorrhoids: Principal | ICD-10-CM | POA: Insufficient documentation

## 2021-07-29 DIAGNOSIS — E119 Type 2 diabetes mellitus without complications: Secondary | ICD-10-CM

## 2021-07-29 DIAGNOSIS — N189 Chronic kidney disease, unspecified: Secondary | ICD-10-CM

## 2021-07-29 LAB — COMPREHENSIVE METABOLIC PANEL
ALT: 11 U/L (ref 0–44)
AST: 20 U/L (ref 15–41)
Albumin: 3.6 g/dL (ref 3.5–5.0)
Alkaline Phosphatase: 69 U/L (ref 38–126)
Anion gap: 12 (ref 5–15)
BUN: 18 mg/dL (ref 8–23)
CO2: 22 mmol/L (ref 22–32)
Calcium: 9.1 mg/dL (ref 8.9–10.3)
Chloride: 106 mmol/L (ref 98–111)
Creatinine, Ser: 1.51 mg/dL — ABNORMAL HIGH (ref 0.44–1.00)
GFR, Estimated: 32 mL/min — ABNORMAL LOW (ref >60)
Glucose, Bld: 156 mg/dL — ABNORMAL HIGH (ref 70–99)
Potassium: 4 mmol/L (ref 3.5–5.1)
Sodium: 140 mmol/L (ref 135–145)
Total Bilirubin: 1 mg/dL (ref 0.3–1.2)
Total Protein: 7.3 g/dL (ref 6.5–8.1)

## 2021-07-29 LAB — CBC
HCT: 31.1 % — ABNORMAL LOW (ref 36.0–46.0)
Hemoglobin: 9.9 g/dL — ABNORMAL LOW (ref 12.0–15.0)
MCH: 29.7 pg (ref 26.0–34.0)
MCHC: 31.8 g/dL (ref 30.0–36.0)
MCV: 93.4 fL (ref 80.0–100.0)
Platelets: 348 10*3/uL (ref 150–400)
RBC: 3.33 MIL/uL — ABNORMAL LOW (ref 3.87–5.11)
RDW: 13.4 % (ref 11.5–15.5)
WBC: 8.5 10*3/uL (ref 4.0–10.5)
nRBC: 0 % (ref 0.0–0.2)

## 2021-07-29 LAB — POC OCCULT BLOOD, ED: Fecal Occult Bld: NEGATIVE

## 2021-07-29 LAB — PROTIME-INR
INR: 1.5 — ABNORMAL HIGH (ref 0.8–1.2)
Prothrombin Time: 17.8 s — ABNORMAL HIGH (ref 11.4–15.2)

## 2021-07-29 LAB — TYPE AND SCREEN
ABO/RH(D): O POS
Antibody Screen: NEGATIVE

## 2021-07-29 LAB — GLUCOSE, CAPILLARY: Glucose-Capillary: 152 mg/dL — ABNORMAL HIGH (ref 70–99)

## 2021-07-29 MED ORDER — LACTATED RINGERS IV SOLN: INTRAVENOUS | Status: DC

## 2021-07-29 MED ORDER — ISOSORBIDE MONONITRATE ER 30 MG PO TB24
30.0000 mg | ORAL_TABLET | Freq: Every day | ORAL | Status: DC
  Administered 2021-07-30 – 2021-07-31 (×2): 30 mg via ORAL
  Filled 2021-07-29 (×2): qty 1

## 2021-07-29 MED ORDER — CALCIUM POLYCARBOPHIL 625 MG PO TABS
625.0000 mg | ORAL_TABLET | Freq: Every day | ORAL | Status: DC
Start: 2021-07-29 — End: 2021-07-31
  Administered 2021-07-29 – 2021-07-31 (×3): 625 mg via ORAL
  Filled 2021-07-29 (×3): qty 1

## 2021-07-29 MED ORDER — POLYETHYLENE GLYCOL 3350 17 G PO PACK
17.0000 g | PACK | Freq: Every day | ORAL | Status: DC | PRN
Start: 2021-07-29 — End: 2021-07-31

## 2021-07-29 MED ORDER — PRAVASTATIN SODIUM 40 MG PO TABS
40.0000 mg | ORAL_TABLET | Freq: Every day | ORAL | Status: DC
  Administered 2021-07-30 – 2021-07-31 (×2): 40 mg via ORAL
  Filled 2021-07-29 (×2): qty 1

## 2021-07-29 MED ORDER — DILTIAZEM HCL ER COATED BEADS 180 MG PO CP24
180.0000 mg | ORAL_CAPSULE | Freq: Every day | ORAL | Status: DC
  Administered 2021-07-30 – 2021-07-31 (×2): 180 mg via ORAL
  Filled 2021-07-29 (×2): qty 1

## 2021-07-29 MED ORDER — METOPROLOL TARTRATE 12.5 MG HALF TABLET
12.5000 mg | ORAL_TABLET | Freq: Two times a day (BID) | ORAL | Status: DC
  Administered 2021-07-30 – 2021-07-31 (×2): 12.5 mg via ORAL
  Filled 2021-07-29 (×3): qty 1

## 2021-07-29 MED ORDER — HYDROCORTISONE ACETATE 25 MG RE SUPP
25.0000 mg | Freq: Every day | RECTAL | Status: DC
  Administered 2021-07-29: 25 mg via RECTAL
  Filled 2021-07-29 (×2): qty 1

## 2021-07-29 MED ORDER — PANTOPRAZOLE SODIUM 40 MG PO TBEC
40.0000 mg | DELAYED_RELEASE_TABLET | Freq: Two times a day (BID) | ORAL | Status: DC
Start: 2021-07-29 — End: 2021-07-31
  Administered 2021-07-29 – 2021-07-31 (×4): 40 mg via ORAL
  Filled 2021-07-29 (×4): qty 1

## 2021-07-29 NOTE — ED Provider Notes (Signed)
Emergency Department Provider Note   I have reviewed the triage vital signs and the nursing notes.   HISTORY  Chief Complaint Rectal Bleeding   HPI Patricia English is a 88 y.o. female with past medical history reviewed below returns to the emergency department with what she describes as heavy bright red blood coming from her rectum earlier this morning.  Patient is accompanied by her children (son/daughter) who assist her at home.  She sat on the commode this morning and noticed bright red blood with attempting to pass a bowel movement.  She did not have rectal pain or fullness.  She was helped to the shower where she continued to have heavy bright red blood per rectum.  She returned to the emergency department.  She was admitted to the hospital recently and discharged on 6/9 after a similar episode.  She remains on her Eliquis, per plan at discharge, and has been compliant with her rectal suppositories for hemorrhoid.    Past Medical History:  Diagnosis Date   Coronary artery disease    Diabetes mellitus without complication ([place])    GI bleeding 11/2017   Hypertension    Internal hemorrhoids     Review of Systems  Constitutional: No fever/chills Eyes: No visual changes. ENT: No sore throat. Cardiovascular: Denies chest pain. Respiratory: Denies shortness of breath. Gastrointestinal: No abdominal pain.  No nausea, no vomiting.  No diarrhea.  No constipation. Positive BRBPR. Genitourinary: Negative for dysuria. Musculoskeletal: Negative for back pain. Skin: Negative for rash. Neurological: Negative for headaches, focal weakness or numbness.   ____________________________________________   PHYSICAL EXAM:  VITAL SIGNS: ED Triage Vitals  Enc Vitals Group     BP 07/29/21 0804 122/71     Pulse Rate 07/29/21 0804 84     Resp 07/29/21 0804 18     Temp 07/29/21 0804 98.8 F (37.1 C)     Temp Source 07/29/21 0804 Oral     SpO2 07/29/21 0804 100 %     Weight 07/29/21  0811 165 lb (74.8 kg)     Height 07/29/21 0811 '5\' 9"'$  (1.753 m)   Constitutional: Alert and oriented. Well appearing and in no acute distress. Eyes: Conjunctivae are normal.  Head: Atraumatic. Nose: No congestion/rhinnorhea. Mouth/Throat: Mucous membranes are moist.   Neck: No stridor.   Cardiovascular: Normal rate, regular rhythm. Good peripheral circulation. Grossly normal heart sounds.   Respiratory: Normal respiratory effort.  No retractions. Lungs CTAB. Gastrointestinal: Soft and nontender. No distention.  Rectal exam performed with patient's verbal consent and nurse chaperone.  I do appreciate some external hemorrhoids which are nonthrombosed and not actively bleeding.  No fissure.  Normal digital rectal exam without gross blood or melena.  Musculoskeletal: No lower extremity tenderness nor edema. No gross deformities of extremities. Neurologic:  Normal speech and language. No gross focal neurologic deficits are appreciated.  Skin:  Skin is warm, dry and intact. No rash noted.  ____________________________________________   LABS (all labs ordered are listed, but only abnormal results are displayed)  Labs Reviewed  COMPREHENSIVE METABOLIC PANEL - Abnormal; Notable for the following components:      Result Value   Glucose, Bld 156 (*)    Creatinine, Ser 1.51 (*)    GFR, Estimated 32 (*)    All other components within normal limits  CBC - Abnormal; Notable for the following components:   RBC 3.33 (*)    Hemoglobin 9.9 (*)    HCT 31.1 (*)    All other components  within normal limits  PROTIME-INR  POC OCCULT BLOOD, ED  TYPE AND SCREEN    ____________________________________________   PROCEDURES  Procedure(s) performed:   Procedures  None  ____________________________________________   INITIAL IMPRESSION / ASSESSMENT AND PLAN / ED COURSE  Pertinent labs & imaging results that were available during my care of the patient were reviewed by me and considered in my  medical decision making (see chart for details).   This patient is Presenting for Evaluation of rectal bleeding, which does require a range of treatment options, and is a complaint that involves a high risk of morbidity and mortality.  The Differential Diagnoses include internal/external hemorrhoidal bleeding, fissure, colitis, proctitis, diverticular bleed.   Critical Interventions-    Medications  lactated ringers infusion ( Intravenous New Bag/Given 07/29/21 1741)    Reassessment after intervention: Patient remain HDS.    I did obtain Additional Historical Information from children at bedside.  I decided to review pertinent External Data, and in summary patient admitted on 6/7 with Eliquis re-started at '5mg'$ . Patient with flex sig during that admit with both internal and external hemorrhoids ([facility]).    Clinical Laboratory Tests Ordered, included globin of 9.9 here similar to her prior values.  She has CKD without acute electrolyte abnormalities. O pos type and screen.   Radiologic Tests: Considered abdominal imaging such as angio of the abdomen but patient does have CKD, nontender abdomen, and recent flex sig showing hemorrhoids.  Diverticular bleed is on the differential but will hold on advanced abdominal imaging requiring contrast for now but this was considered.   Cardiac Monitor Tracing which shows NSR.    Social Determinants of Health Risk patient is a non-smoker.   Consult complete with Internal Medicine teaching service. They will evaluate and plan on admit.   Medical Decision Making: Summary:  Patient presents to the emergency department for evaluation of rectal bleeding returning this morning.  She arrives without active bleeding on my exam.  Her vital signs are stable.  Hemoglobin is similar to prior.  I am concerned given her overall frailty, age, anticoagulated status.  There is some mention of altering her anticoagulation dosing during her last admit.  Will discuss  Aggie Moats admit to make sure large-volume bleeding does not return which may prompt further intervention.   Reevaluation with update and discussion with patient and family. They are in agreement with plan for admit.   Disposition: admit  ____________________________________________  FINAL CLINICAL IMPRESSION(S) / ED DIAGNOSES  Final diagnoses:  Rectal bleeding     Note:  This document was prepared using Dragon voice recognition software and may include unintentional dictation errors.  Nanda Quinton, MD, [facility] Emergency Medicine    Javar Eshbach, [facility], MD 07/29/21 1750

## 2021-07-29 NOTE — ED Provider Triage Note (Signed)
Emergency Medicine Provider Triage Evaluation Note  Patricia English , a 88 y.o. female  was evaluated in triage.  Pt complains of bright red blood per rectum times 1 episode today. Seen recently for same, Eliquis was dc, bleeding had another episode and was thought to be due to hemorrhoids so Eliquis was resumed. Is currently on Eliquis.   Review of Systems  Positive: Rectal bleeding Negative:   Physical Exam  BP 122/71 (BP Location: Left Arm)   Pulse 84   Temp 98.8 F (37.1 C) (Oral)   Resp 18   Ht '5\' 9"'$  (1.753 m)   Wt 74.8 kg   SpO2 100%   BMI 24.37 kg/m  Gen:   Awake, no distress   Resp:  Normal effort  MSK:   Moves extremities without difficulty  Other:    Medical Decision Making  Medically screening exam initiated at 8:21 AM.  Appropriate orders placed.  Patricia English was informed that the remainder of the evaluation will be completed by another provider, this initial triage assessment does not replace that evaluation, and the importance of remaining in the ED until their evaluation is complete.     Tacy Learn, PA-C 07/29/21 [phone]

## 2021-07-29 NOTE — H&P (Signed)
NAME:  Patricia English, MRN:  [id number], DOB:  1927/09/08, LOS: 0 ADMISSION DATE:  07/29/2021, Primary: [name], MD  CHIEF COMPLAINT:  rectal bleeding   Medical Service: Internal Medicine Teaching Service         Attending Physician: Dr. Velna Ochs, MD    First Contact: Dr. Master Pager: [phone]  Second Contact: Dr. Collene Gobble Pager: [phone]       After Hours (After 5p/  First Contact Pager: [phone]  weekends / holidays): Second Contact Pager: Patricia English is 88yo person with recent GI bleeds 2/2 hemorrhoids and diverticular, previous embolic stroke, atrial fibrillation on anticoagulation, type II diabetes mellitus, hypertension presenting to [facility] after acute GI bleed this morning. Since discharge on 6/9, patient reports some constipation, but otherwise doing well at home. Until this morning, she had been eating and drinking regularly without changes in diet. She also reports compliance with her medications, including her suppository, pantoprazole, and Eliquis. After she woke up this morning, she had a normal bowel movement. She then stood up to wipe when she noticed bright red blood in the toilet. She denies any accompanying symptoms, including dizziness, lightheadedness, chest pain, dyspnea, abdominal pain, n/v, rectal pain. She also has not had any falls since being home from the most recent hospitalization.  PCP: [name], MD  ED COURSE  On arrival to [facility], patient hemodynamically stable on room air. Lab work revealed Hgb 9.9, up from 9.1 at discharge on 6/9. Also has mild acute kidney injury and hyperglycemia. IMTS was subsequently consulted for admission for acute GI bleed, acute kidney injury, and optimization of medications in setting of elderly patient.   PAST MEDICAL HISTORY  She,  has a past medical history of Coronary artery disease, Diabetes mellitus without complication ([place]), GI bleeding (11/2017), Hypertension,  and Internal hemorrhoids.   HOME MEDICATIONS   Prior to Admission medications   Medication Sig Start Date End Date Taking? Authorizing Provider  apixaban (ELIQUIS) 5 MG TABS tablet Take 1 tablet (5 mg total) by mouth 2 (two) times daily. 07/23/21  Yes Masters, Katie, DO  Cholecalciferol 25 MCG (1000 UT) tablet Take 1,000 Units by mouth daily.   Yes [provider]  diltiazem (DILACOR XR) 180 MG 24 hr capsule Take 1 capsule (180 mg total) by mouth daily. Patient taking differently: Take 180 mg by mouth in the morning. 07/18/21  Yes Charolette Forward, MD  furosemide (LASIX) 40 MG tablet Take 1 tablet (40 mg total) by mouth every Monday, Wednesday, and Friday. 07/07/21  Yes Raulkar, Clide Deutscher, MD  hydrocortisone (ANUSOL-HC) 25 MG suppository Place 1 suppository (25 mg total) rectally at bedtime for 20 days. 07/23/21 08/12/21 Yes Masters, Katie, DO  isosorbide mononitrate (IMDUR) 30 MG 24 hr tablet Take 30 mg by mouth in the morning.   Yes [provider]  metoprolol tartrate (LOPRESSOR) 25 MG tablet Take 0.5 tablets (12.5 mg total) by mouth 2 (two) times daily. 07/18/21  Yes Charolette Forward, MD  pantoprazole (PROTONIX) 40 MG tablet Take 1 tablet (40 mg total) by mouth 2 (two) times daily. Patient taking differently: Take 40 mg by mouth 2 (two) times daily before a meal. 07/06/21  Yes Raulkar, Clide Deutscher, MD  polycarbophil (FIBERCON) 625 MG tablet Take 1 tablet (625 mg total) by mouth daily. 07/23/21  Yes Masters, Katie, DO  polyethylene glycol (MIRALAX / GLYCOLAX) 17 g packet Take 17 g by mouth daily as needed for moderate constipation. 07/18/21  Yes Charolette Forward, MD  pravastatin (PRAVACHOL) 40 MG tablet Take 1 tablet (40 mg total) by mouth daily. 07/13/21  Yes Raulkar, Clide Deutscher, MD  benzonatate (TESSALON PERLES) 100 MG capsule Take 2 capsules (200 mg total) by mouth 3 (three) times daily as needed for cough. Patient not taking: Reported on 07/29/2021 07/06/21   Izora Ribas, MD    ALLERGIES    Allergies as of 07/29/2021 - Review Complete 07/29/2021  Allergen Reaction Noted   Lisinopril Swelling and Other (See Comments) 12/08/2017    SOCIAL HISTORY   Patient lives in [place] with her son. Since her stroke, she has been independent with ADL's, depending on IADL's. No history of smoking, alcohol use, or other drug use.  FAMILY HISTORY  Her family history includes Diabetes in her mother; Heart Problems in her mother.   REVIEW OF SYSTEMS  ROS per history of present illness.  PHYSICAL EXAMINATION  Blood pressure (!) 115/95, pulse (!) 107, temperature 98.4 F (36.9 C), temperature source Oral, resp. rate (!) 24, height '5\' 9"'$  (1.753 m), weight 74.8 kg, SpO2 100 %.    Filed Weights   07/29/21 0811  Weight: 74.8 kg   GENERAL: Resting comfortably in no acute distress HENT: Normocephalic, atraumatic. Supple neck. CV:  Regular rate, irregular rhythm. 2/6 systolic murmur appreciated. Distal pulses 2+ bilaterally. Cap refill <2 sec. PULM: Normal work of breathing on room air. Clear to ausculation bilaterally. GI:  Abdomen soft non-tender, non-distended. Normoactive bowel sounds. Multiple external hemorrhoids present, no active bleeding appreciated. MSK: Normal bulk, tone. No pitting edema bilateral lower extremity. SKIN: Warm, dry. Minor contusion on R hand.  NEURO: Awake, alert, conversing appropriately. Grossly non-focal PSYCH: Normal mood, speech, affect.  SIGNIFICANT DIAGNOSTIC TESTS   N/A  LABS      Latest Ref Rng & Units 07/29/2021    8:14 AM 07/23/2021    4:17 AM 07/22/2021    3:27 AM  CBC  WBC 4.0 - 10.5 K/uL 8.5  10.0  8.2   Hemoglobin 12.0 - 15.0 g/dL 9.9  9.1  8.8   Hematocrit 36.0 - 46.0 % 31.1  28.4  27.1   Platelets 150 - 400 K/uL 348  251  221       Latest Ref Rng & Units 07/29/2021    8:14 AM 07/23/2021    4:17 AM 07/22/2021    3:27 AM  BMP  Glucose 70 - 99 mg/dL 156  134  131   BUN 8 - 23 mg/dL '18  9  10   '$ Creatinine 0.44 - 1.00 mg/dL 1.51  1.12   1.20   Sodium 135 - 145 mmol/L 140  140  145   Potassium 3.5 - 5.1 mmol/L 4.0  4.1  4.1   Chloride 98 - 111 mmol/L 106  112  116   CO2 22 - 32 mmol/L '22  24  21   '$ Calcium 8.9 - 10.3 mg/dL 9.1  8.6  8.6     CONSULTS  N/A  ASSESSMENT   Jyla Serpas is 89yo person with recent GI bleeds 2/2 hemorrhoids and diverticular, previous embolic stroke, paroxysmal atrial fibrillation on anticoagulation, type II diabetes mellitus, hypertension admitted 6/15 with recurrent GI bleed and acute kidney injury.   PLAN  Principal Problem:   Acute lower GI bleeding Active Problems:   DM2 (diabetes mellitus, type 2) (HCC)   Essential hypertension   Chronic kidney disease (CKD)   Atrial fibrillation (HCC)   Coronary artery disease   Hyperlipidemia  #  Acute lower GI bleed, recurrent #Grade II internal and external hemorrhoids #Diverticulosis #History of embolic stroke on anticoagulation Ms. Reith is arrived to [facility] after another episode of lower GI bleeding. She has been hemodynamically stable since arrival. She is not symptomatic and her Hgb has improved from discharge, 9.9<9.1. She did have episode of bright red blood per rectum, most likely hemorrhoidal vs diverticular. Ms. Axelrod is well-known to GI, as this is her third admission for the same problem. During her last admission, flexible sigmoidoscopy revealed multiple internal and external hemorrhoids along with many diverticula. Will hold off consultation this evening, can consider consult in morning for evaluation of hemorrhoidal banding, although this could be done in th outpatient setting after stabilization. This evening, will plan to continue her home PPI twice daily, hold Eliquis this evening. During previous admission, there was discussion regarding reducing her Eliquis dose. However, she only meets one criteria (age). I would be hesitant to decrease the dose, therefore effectively increasing her risk of both stroke and GI bleeds, especially  since her bleeding has been largely small, low volume bleeds. Began discussion with patient and her daughter this evening regarding Eliquis. Will meet with them tomorrow morning for mutual shared-decision making.  - Hold Eliquis this evening, consider resuming in AM - Continue home pantoprazole '40mg'$  twice daily - Continue home hydrocortisone suppository - Daily Fibercon, Miralax PRN - Daily CBC - Transfuse Hgb <8  #Mild AKI on CKDIIIb Initial lab work revealed sCr 1.5 w/ GFR 32. Baseline renal function sCr 1.1-1.2. Mild AKI likely 2/2 dehydration. She was given fluids by the ED. She is able to tolerate PO, will hold further fluids this evening. - Encourage PO intake - Repeat BMP in AM  #Atrial fibrillation on Eliquis No ECG on arrival. Per exam and tele, I believe she is in atrial fibrillation. Will get ECG. Holding Eliquis given GI bleed, will continue with home CCB.  - Follow-up ECG - Continue home diltiazem - Hold home Eliquis  #Hypertension Patient normotensive since arrival. Will hold off evening dose of lopressor, likely can re-start the following home regimen in the morning: - Diltiazem '180mg'$  daily - Isosorbide mononitrate '30mg'$  daily - Metoprolol tartrate 12.'5mg'$  twice daily  #Type II diabetes mellitus Last A1c 6.3%. With her age, will hold glipizide. At discharge can discuss risk and benefits of continuing this medication.  - Hold home glipizide - Sliding scale  #Coronary artery disease #Hyperlipidemia Previous LHC in 2012 w/ minimal CAD. Last LDL 79. Will continue medical management - Pravastatin '40mg'$  daily  BEST PRACTICE  DIET: CM IVF: n/a DVT PPX: SCD BOWEL: Miralax CODE: Full FAM COM: Updated at bedside  DISPO: Admit patient to Observation with expected length of stay less than 2 midnights.  Sanjuan Dame, MD Internal Medicine Resident PGY-2 PAGER: [phone] 07/29/2021 6:40 PM  If after hours (below), please contact on-call pager:  [phone] 5PM-7AM Monday-Friday 1PM-7AM Saturday-Sunday

## 2021-07-29 NOTE — ED Triage Notes (Addendum)
Patient reports started having bright red bleeding in stool again this morning.  Unsure if it is caused by eliquis or hemorrhoids.  She started back on it. Has only had one episode this morning and denies pain.

## 2021-07-30 ENCOUNTER — Encounter (HOSPITAL_COMMUNITY)
Admission: EM | Disposition: A | Discharge status: Home Health | Payer: Self-pay | Source: Home / Self Care | Attending: Internal Medicine

## 2021-07-30 ENCOUNTER — Encounter (HOSPITAL_COMMUNITY): Payer: Self-pay | Admitting: Internal Medicine

## 2021-07-30 DIAGNOSIS — K644 Residual hemorrhoidal skin tags: Secondary | ICD-10-CM

## 2021-07-30 DIAGNOSIS — K625 Hemorrhage of anus and rectum: Secondary | ICD-10-CM

## 2021-07-30 DIAGNOSIS — K648 Other hemorrhoids: Secondary | ICD-10-CM | POA: Diagnosis not present

## 2021-07-30 DIAGNOSIS — K642 Third degree hemorrhoids: Secondary | ICD-10-CM | POA: Diagnosis not present

## 2021-07-30 DIAGNOSIS — K922 Gastrointestinal hemorrhage, unspecified: Secondary | ICD-10-CM | POA: Diagnosis not present

## 2021-07-30 HISTORY — PX: FLEXIBLE SIGMOIDOSCOPY: SHX5431

## 2021-07-30 HISTORY — PX: HEMORRHOID BANDING: SHX5850

## 2021-07-30 LAB — BASIC METABOLIC PANEL
Anion gap: 10 (ref 5–15)
BUN: 18 mg/dL (ref 8–23)
CO2: 23 mmol/L (ref 22–32)
Calcium: 8.8 mg/dL — ABNORMAL LOW (ref 8.9–10.3)
Chloride: 108 mmol/L (ref 98–111)
Creatinine, Ser: 1.24 mg/dL — ABNORMAL HIGH (ref 0.44–1.00)
GFR, Estimated: 41 mL/min — ABNORMAL LOW (ref >60)
Glucose, Bld: 121 mg/dL — ABNORMAL HIGH (ref 70–99)
Potassium: 3.7 mmol/L (ref 3.5–5.1)
Sodium: 141 mmol/L (ref 135–145)

## 2021-07-30 LAB — CBC
HCT: 27.9 % — ABNORMAL LOW (ref 36.0–46.0)
Hemoglobin: 9.2 g/dL — ABNORMAL LOW (ref 12.0–15.0)
MCH: 30.1 pg (ref 26.0–34.0)
MCHC: 33 g/dL (ref 30.0–36.0)
MCV: 91.2 fL (ref 80.0–100.0)
Platelets: 307 10*3/uL (ref 150–400)
RBC: 3.06 MIL/uL — ABNORMAL LOW (ref 3.87–5.11)
RDW: 13.3 % (ref 11.5–15.5)
WBC: 7.2 10*3/uL (ref 4.0–10.5)
nRBC: 0 % (ref 0.0–0.2)

## 2021-07-30 LAB — IRON AND TIBC
Iron: 23 ug/dL — ABNORMAL LOW (ref 28–170)
Saturation Ratios: 7 % — ABNORMAL LOW (ref 10.4–31.8)
TIBC: 342 ug/dL (ref 250–450)
UIBC: 319 ug/dL

## 2021-07-30 LAB — VITAMIN B12: Vitamin B-12: 243 pg/mL (ref 180–914)

## 2021-07-30 LAB — GLUCOSE, CAPILLARY
Glucose-Capillary: 122 mg/dL — ABNORMAL HIGH (ref 70–99)
Glucose-Capillary: 170 mg/dL — ABNORMAL HIGH (ref 70–99)
Glucose-Capillary: 127 mg/dL — ABNORMAL HIGH (ref 70–99)
Glucose-Capillary: 129 mg/dL — ABNORMAL HIGH (ref 70–99)

## 2021-07-30 LAB — FERRITIN: Ferritin: 21 ng/mL (ref 11–307)

## 2021-07-30 LAB — FOLATE: Folate: 14.1 ng/mL (ref >5.9)

## 2021-07-30 SURGERY — SIGMOIDOSCOPY, FLEXIBLE

## 2021-07-30 MED ORDER — FLEET ENEMA 7-19 GM/118ML RE ENEM
1.0000 | ENEMA | Freq: Once | RECTAL | Status: AC
  Administered 2021-07-30: 1 via RECTAL
  Filled 2021-07-30: qty 1

## 2021-07-30 MED ORDER — INSULIN ASPART 100 UNIT/ML IJ SOLN
0.0000 [IU] | Freq: Three times a day (TID) | INTRAMUSCULAR | Status: DC
  Administered 2021-07-30: 3 [IU] via SUBCUTANEOUS
  Administered 2021-07-30: 2 [IU] via SUBCUTANEOUS
  Administered 2021-07-31: 3 [IU] via SUBCUTANEOUS

## 2021-07-30 NOTE — TOC Initial Note (Signed)
Transition of Care [facility]) - Initial/Assessment Note    Patient Details  Name: Patricia English MRN: [id number] Date of Birth: Jun 21, 1927  [facility] CM/SW Contact:    Marilu Favre, RN Phone Number: 07/30/2021, 2:40 PM  Clinical Narrative:                 Spoke to patient and family at bedside.   Patient from home active with [facility] for [place] and [place] and wants to continue services at discharge. Confirmed with Caryl Pina with [facility] . Received HHPT/OT orders and face to face . Caryl Pina with [facility] aware   Expected Discharge Plan: [place] Barriers to Discharge: Continued Medical Work up   Patient Goals and CMS Choice Patient states their goals for this hospitalization and ongoing recovery are:: to return to home CMS Medicare.gov Compare Post Acute Care list provided to:: Patient Choice offered to / list presented to : Patient  Expected Discharge Plan and Services Expected Discharge Plan: [place]   Discharge Planning Services: CM Consult Post Acute Care Choice: [place] arrangements for the past 2 months: Single Family Home                   DME Agency: NA       HH Arranged: PT, OT [place] Agency: [place] ([place]) Date HH Agency Contacted: 07/30/21 Time [place]: [place] Representative spoke with at [place]: [place] Arrangements/Services Living arrangements for the past 2 months: [place] Lives with:: Relatives Patient language and need for interpreter reviewed:: Yes Do you feel safe going back to the place where you live?: Yes      Need for Family Participation in Patient Care: Yes (Comment) Care giver support system in place?: Yes (comment) Current home services: DME Criminal Activity/Legal Involvement Pertinent to Current Situation/Hospitalization: No - Comment as needed  Activities of Daily Living   ADL Screening (condition at time of  admission) Patient's cognitive ability adequate to safely complete daily activities?: Yes Is the patient deaf or have difficulty hearing?: No Does the patient have difficulty seeing, even when wearing glasses/contacts?: No Does the patient have difficulty concentrating, remembering, or making decisions?: No Patient able to express need for assistance with ADLs?: Yes Does the patient have difficulty dressing or bathing?: No Independently performs ADLs?: Yes (appropriate for developmental age) Communication: Independent Dressing (OT): Independent Grooming: Independent Feeding: Independent Bathing: Independent Toileting: Independent In/Out Bed: Independent Walks in Home: Independent Does the patient have difficulty walking or climbing stairs?: Yes Weakness of Legs: Left Weakness of Arms/Hands: Left  Permission Sought/Granted   Permission granted to share information with : Yes, Verbal Permission Granted  Share Information with NAME: family at bedside           Emotional Assessment Appearance:: Appears stated age Attitude/Demeanor/Rapport: Engaged Affect (typically observed): Accepting Orientation: : Oriented to Self, Oriented to Place, Oriented to  Time, Oriented to Situation Alcohol / Substance Use: Not Applicable Psych Involvement: No (comment)  Admission diagnosis:  Rectal bleeding [K62.5] GI bleed [K92.2] Patient Active Problem List   Diagnosis Date Noted   Chronic kidney disease (CKD) 07/29/2021   Atrial fibrillation ([place]) 07/29/2021   Coronary artery disease 07/29/2021   Hyperlipidemia 07/29/2021   Acute on chronic blood loss anemia 07/22/2021   Essential hypertension 06/24/2021   Acute on chronic renal failure ([place]) 06/24/2021   Anemia 06/24/2021   Acute ischemic right MCA stroke ([place]) 06/21/2021  Acute stroke due to ischemia ([place]) 06/16/2021   Acute GI bleeding 10/28/2020   DM2 (diabetes mellitus, type 2) ([place]) 10/28/2020   Weakness 10/28/2020    Class:  Acute   Acute upper GI bleed 10/28/2020   Internal and external bleeding hemorrhoids    Acute lower GI bleeding 12/08/2017   Dysphagia 08/11/2017    Class: Acute   PCP:  [name], MD Pharmacy:   [id number]: [phone]: [phone]    Social Determinants of Health (SDOH) Interventions    Readmission Risk Interventions     No data to display

## 2021-07-30 NOTE — Progress Notes (Addendum)
HD#0 Subjective:  Overnight Events: none  Patient assessed at bedside this AM. She states that she has not had further episode of rectal bleeding since yesterday morning. Denies abdominal pain or other pain. She was able to eat breakfast this morning.  Pt is updated on the plan for today, and all questions and concerns are addressed.   Objective:  Vital signs in last 24 hours: Vitals:   07/29/21 2115 07/29/21 2150 07/30/21 0014 07/30/21 0407  BP: (!) 144/81 (!) 151/81 139/79 132/71  Pulse: 91 99 83 88  Resp: '18 17 16 16  '$ Temp: (!) 97.5 F (36.4 C) 98.3 F (36.8 C) 97.7 F (36.5 C) 98.1 F (36.7 C)  TempSrc: Oral Oral Oral Oral  SpO2: 100% 96% 100% 97%  Weight:      Height:       Supplemental O2: Room Air SpO2: 97 %   Physical Exam:  Constitutional: well-appearing, sitting up in bed in no acute distress Cardiovascular: irregular rate and rhythm, no m/r/g Pulmonary/Chest: normal work of breathing on room air, lungs clear to auscultation bilaterally Abdominal: soft, non-tender, non-distended, bowel sounds present MSK: no edema in lower extremities bilaterally, external hemorrhoids present on exam Skin: warm and dry Psych: normal mood and affect  Filed Weights   07/29/21 0811  Weight: 74.8 kg     Intake/Output Summary (Last 24 hours) at 07/30/2021 0714 Last data filed at 07/30/2021 0500 Gross per 24 hour  Intake 240 ml  Output --  Net 240 ml   Net IO Since Admission: 240 mL [07/30/21 0714]  Pertinent Labs:    Latest Ref Rng & Units 07/30/2021    3:42 AM 07/29/2021    8:14 AM 07/23/2021    4:17 AM  CBC  WBC 4.0 - 10.5 K/uL 7.2  8.5  10.0   Hemoglobin 12.0 - 15.0 g/dL 9.2  9.9  9.1   Hematocrit 36.0 - 46.0 % 27.9  31.1  28.4   Platelets 150 - 400 K/uL 307  348  251        Latest Ref Rng & Units 07/30/2021    3:42 AM 07/29/2021    8:14 AM 07/23/2021    4:17 AM  CMP  Glucose 70 - 99 mg/dL 121  156  134   BUN 8 - 23 mg/dL '18  18  9   '$ Creatinine 0.44 - 1.00  mg/dL 1.24  1.51  1.12   Sodium 135 - 145 mmol/L 141  140  140   Potassium 3.5 - 5.1 mmol/L 3.7  4.0  4.1   Chloride 98 - 111 mmol/L 108  106  112   CO2 22 - 32 mmol/L '23  22  24   '$ Calcium 8.9 - 10.3 mg/dL 8.8  9.1  8.6   Total Protein 6.5 - 8.1 g/dL  7.3    Total Bilirubin 0.3 - 1.2 mg/dL  1.0    Alkaline Phos 38 - 126 U/L  69    AST 15 - 41 U/L  20    ALT 0 - 44 U/L  11      Imaging: No results found.  Assessment/Plan:   Principal Problem:   Acute lower GI bleeding Active Problems:   DM2 (diabetes mellitus, type 2) (HCC)   Essential hypertension   Chronic kidney disease (CKD)   Atrial fibrillation ([place])   Coronary artery disease   Hyperlipidemia   Patient Summary: Patricia English is a 88 y.o. with a pertinent PMH of recent GI bleeds  2/2 hemorrhoids and diverticular, previous embolic stroke, atrial fibrillation on anticoagulation, type II diabetes mellitus, hypertension , who presented with bleeding after bowel movment and admitted on 6/15 for rectal bleeding on HD#0.   Acute lower GI bleed, recurrent  Grade II internal and external hemorrhoids Diverticulosis No further episodes of rectal bleeding since yesterday morning. Blood count is stable from 9.9 to 9.2 this AM. Differentials are hemorrhoidal vs diverticular bleed. Recent flex sig showed internal and external hemorrhoids with many diverticula. This is her third admission for lower GI bleed. She was restarted on eliquis at last admission. Will plan to continue conversations with family about risk and benefits of her being on blood thinner moving forward. GI has been consulted, they are familiar with case. I am unsure if they may consider banding procedure inpatient for her. - GI consulted - Hold eliquis  - Continue home pantoprazole '40mg'$  twice daily - Continue home hydrocortisone suppository - Daily Fibercon, Miralax PRN - Daily CBC - Transfuse Hgb <8  History of embolic stroke on anticoagulation Atrial fibrillation  on eliquis CHADVASC 7 with HASBLED score of 4. Patient was restarted on eliquis in May after she had a stroke. Will continue conversations with family about risks and benefits of taking eliquis vs reduced dosing vs no blood thinner moving forward. -hold eliquis -Continue home diltiazem -continue home metoprolol tartrate 12.5 mg daily  AKI on CKD 3b Creatinine trending down from 1.5 to 1.2 today which is at baseline of 1.1-1.2.  -trend BMP  Hypertension Patient remains normotensive.  - Diltiazem '180mg'$  daily - Isosorbide mononitrate '30mg'$  daily - Metoprolol tartrate 12.'5mg'$  twice daily  Well-controlled type 2 diabetes mellitus Last A1c 6.3%.  -holding glizide -SSI  Chronic normocytic anemia Hgb stable at 9.2. Baseline is around 9-11 over last few months. She has been treated for iron deficiency in the past, but last iron study was not consistent with iron deficiency. Iron supplementation held at last admission due to this possibly contributing to constipation. -follow-up iron studies -Folate -B12  CAD HLD -pravastatin 40 mg qd  Diet: Carb-Modified VTE: SCDs Code: Full PT/OT recs: Pending. Family Update: called and updated granddaughter   Dispo: Anticipated discharge to Home in 1-2 days pending GI evaluation and decision made about eliquis.   Alisi Lupien M. Jais Demir, D.O.  Internal Medicine Resident, PGY-1 [facility]  Pager: [phone] 7:14 AM, 07/30/2021   **Please contact the on call pager after 5 pm and on weekends at [phone].**

## 2021-07-30 NOTE — Op Note (Signed)
[facility] Patient Name: Patricia English Procedure Date : 07/30/2021 MRN: [id number] Attending MD: [name] , MD Date of Birth: Jan 01, 1928 CSN: [id number] Age: 88 Admit Type: Inpatient Procedure:                Flexible Sigmoidoscopy Indications:              Rectal hemorrhage Providers:                [name], MD, Dulcy Fanny, William Dalton, Technician Referring MD:              Medicines:                None Complications:            No immediate complications. Estimated Blood Loss:     Estimated blood loss: none. Procedure:                Pre-Anesthesia Assessment:                           - Prior to the procedure, a History and Physical                            was performed, and patient medications and                            allergies were reviewed. The patient's tolerance of                            previous anesthesia was also reviewed. The risks                            and benefits of the procedure and the sedation                            options and risks were discussed with the patient.                            All questions were answered, and informed consent                            was obtained. Prior Anticoagulants: The patient                            last took Eliquis (apixaban) 2 days prior to the                            procedure. ASA Grade Assessment: III - A patient                            with severe systemic disease. After reviewing the                            risks  and benefits, the patient was deemed in                            satisfactory condition to undergo the procedure.                           After obtaining informed consent, the scope was                            passed under direct vision. The [id number]-[id number] ([id number])                            Olympus endoscope was introduced through the anus                            and advanced to the the sigmoid colon.  The flexible                            sigmoidoscopy was accomplished without difficulty. Scope In: 3:25:10 PM Scope Out: 3:31:39 PM Total Procedure Duration: 0 hours 6 minutes 29 seconds  Findings:      Hemorrhoids were found on perianal exam.      External and internal hemorrhoids were found. The hemorrhoids were Grade       III (internal hemorrhoids that prolapse but require manual reduction).       Three bands were successfully placed. Estimated blood loss: none.      The exam was otherwise without abnormality. Impression:               - Hemorrhoids found on perianal exam.                           - External and internal hemorrhoids. Banded.                           - The examination was otherwise normal.                           - No specimens collected. Recommendation:           - Return patient to hospital ward for ongoing care.                           - Leave off Eliquis for now - would restart in 3                            days                           No intrarectal meds                           Keep f/u me in August                           Ice prn pain - it is normal to have a pressure  sensation in rectum (like an urge to defecate) -                            usu dissipates in 24 hrs                           Home tomorrow if ok. There may still be some                            bleeding - it can take time for this to resolve and                            may need repeat treatment Procedure Code(s):        --- Professional ---                           [phone], Sigmoidoscopy, flexible; with band                            ligation(s) (eg, hemorrhoids) Diagnosis Code(s):        --- Professional ---                           K64.2, Third degree hemorrhoids                           K62.5, Hemorrhage of anus and rectum CPT copyright 2019 [employer] Medical Association. All rights reserved. The codes documented in this report are  preliminary and upon coder review may  be revised to meet current compliance requirements. [name], MD 07/30/2021 3:48:43 PM This report has been signed electronically. Number of Addenda: 0

## 2021-07-30 NOTE — Consult Note (Signed)
Consultation  Referring Provider:  Medicine service /[place] Primary Care Physician:  [name], MD Primary Gastroenterologist:  Dr.Gessner  Reason for Consultation:  rectal bleeding  HPI: Patricia English is a 88 y.o. 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  She was started on Eliquis at that time. She was admitted on 07/21/2021 with hematochezia.  Eliquis was held, this was felt likely hemorrhoidal and she underwent flexible sigmoidoscopy with Dr. Rush Landmark on 07/22/2021 which revealed multiple diverticuli, poor prep, grade 2 internal hemorrhoids and external hemorrhoids. She did not have significant drop in hemoglobin did not require transfusion. Hemoglobin 9.6 on discharge and Eliquis was resumed at discharge. Patient says she did well, did not have any evidence of any bleeding with bowel movements until yesterday.  She says she was sitting on the commode to urinate and then had blood from the rectum dripping into the commode.  Apparently once this started it was persisting and her family brought her to the emergency room. Hemoglobin was 9.9 yesterday hematocrit 31.1/BUN 18/creatinine 1.51  Today hemoglobin 9.2  Not had any further bleeding since admission no bowel movements and no complaints of abdominal discomfort. Last  dose of Eliquis was Wednesday evening 07/28/2021   Patient also had an admission 6/2 through 07/18/2021 with rectal bleeding that point with maroonish blood.  She was seen by GI and this episode was felt diverticular in etiology.  She had undergone EGD September 2022 for complaints of melena and hematochezia, this was a negative exam, and also had colonoscopy at that same setting with poor prep but noted diverticulosis throughout the colon, and had removal of 3 polyps, 4 to 8 mm in size.  Path shows these to be tubular adenomas and 1 was an inflammatory polyp She was noted to have some small  rectal ulcers which were biopsied and were benign and suspected rectal prolapse.  She had undergone flexible sigmoidoscopy per Dr. Carlean Purl, October 2019 while hospitalized, was noted to have grade 3 internal hemorrhoids and underwent banding x3.Marland Kitchen     Past Medical History:  Diagnosis Date   Coronary artery disease    Diabetes mellitus without complication ([place])    GI bleeding 11/2017   Hypertension    Internal hemorrhoids     Past Surgical History:  Procedure Laterality Date   BIOPSY  10/30/2020   Procedure: BIOPSY;  Surgeon: Sharyn Creamer, MD;  Location: [place];  Service: Gastroenterology;;   BIOPSY  10/31/2020   Procedure: BIOPSY;  Surgeon: Sharyn Creamer, MD;  Location: [place];  Service: Gastroenterology;;   CATARACT EXTRACTION     COLONOSCOPY WITH PROPOFOL N/A 10/31/2020   Procedure: COLONOSCOPY WITH PROPOFOL;  Surgeon: Sharyn Creamer, MD;  Location: [place];  Service: Gastroenterology;  Laterality: N/A;   ESOPHAGOGASTRODUODENOSCOPY (EGD) WITH PROPOFOL N/A 08/13/2017   Procedure: ESOPHAGOGASTRODUODENOSCOPY (EGD) WITH PROPOFOL;  Surgeon: [name], MD;  Location: [place];  Service: Endoscopy;  Laterality: N/A;   ESOPHAGOGASTRODUODENOSCOPY (EGD) WITH PROPOFOL N/A 10/30/2020   Procedure: ESOPHAGOGASTRODUODENOSCOPY (EGD) WITH PROPOFOL;  Surgeon: Sharyn Creamer, MD;  Location: [place];  Service: Gastroenterology;  Laterality: N/A;   FLEXIBLE SIGMOIDOSCOPY N/A 12/10/2017   Procedure: FLEXIBLE SIGMOIDOSCOPY;  Surgeon: [name], MD;  Location: [facility] ENDOSCOPY;  Service: Endoscopy;  Laterality: N/A;   FLEXIBLE SIGMOIDOSCOPY N/A 07/22/2021   Procedure: FLEXIBLE SIGMOIDOSCOPY;  Surgeon: Rush Landmark Telford Nab., MD;  Location: [place];  Service: Gastroenterology;  Laterality: N/A;   HEMORRHOID  BANDING  12/10/2017   Procedure: HEMORRHOID BANDING;  Surgeon: [name], MD;  Location: [place];  Service: Endoscopy;;   HEMOSTASIS CLIP PLACEMENT   10/31/2020   Procedure: HEMOSTASIS CLIP PLACEMENT;  Surgeon: Sharyn Creamer, MD;  Location: [place];  Service: Gastroenterology;;   POLYPECTOMY  10/31/2020   Procedure: POLYPECTOMY;  Surgeon: Sharyn Creamer, MD;  Location[facility] ENDOSCOPY;  Service: Gastroenterology;;    Prior to Admission medications   Medication Sig Start Date End Date Taking? Authorizing Provider  apixaban (ELIQUIS) 5 MG TABS tablet Take 1 tablet (5 mg total) by mouth 2 (two) times daily. 07/23/21  Yes Masters, Katie, DO  Cholecalciferol 25 MCG (1000 UT) tablet Take 1,000 Units by mouth daily.   Yes [provider]  diltiazem (DILACOR XR) 180 MG 24 hr capsule Take 1 capsule (180 mg total) by mouth daily. Patient taking differently: Take 180 mg by mouth in the morning. 07/18/21  Yes Charolette Forward, MD  furosemide (LASIX) 40 MG tablet Take 1 tablet (40 mg total) by mouth every Monday, Wednesday, and Friday. 07/07/21  Yes Raulkar, Clide Deutscher, MD  hydrocortisone (ANUSOL-HC) 25 MG suppository Place 1 suppository (25 mg total) rectally at bedtime for 20 days. 07/23/21 08/12/21 Yes Masters, Katie, DO  isosorbide mononitrate (IMDUR) 30 MG 24 hr tablet Take 30 mg by mouth in the morning.   Yes [provider]  metoprolol tartrate (LOPRESSOR) 25 MG tablet Take 0.5 tablets (12.5 mg total) by mouth 2 (two) times daily. 07/18/21  Yes Charolette Forward, MD  pantoprazole (PROTONIX) 40 MG tablet Take 1 tablet (40 mg total) by mouth 2 (two) times daily. Patient taking differently: Take 40 mg by mouth 2 (two) times daily before a meal. 07/06/21  Yes Raulkar, Clide Deutscher, MD  polycarbophil (FIBERCON) 625 MG tablet Take 1 tablet (625 mg total) by mouth daily. 07/23/21  Yes Masters, Katie, DO  polyethylene glycol (MIRALAX / GLYCOLAX) 17 g packet Take 17 g by mouth daily as needed for moderate constipation. 07/18/21  Yes Charolette Forward, MD  pravastatin (PRAVACHOL) 40 MG tablet Take 1 tablet (40 mg total) by mouth daily. 07/13/21  Yes Raulkar, Clide Deutscher, MD  benzonatate (TESSALON PERLES) 100 MG capsule Take 2 capsules (200 mg total) by mouth 3 (three) times daily as needed for cough. Patient not taking: Reported on 07/29/2021 07/06/21   Izora Ribas, MD    Current Facility-Administered Medications  Medication Dose Route Frequency Provider Last Rate Last Admin   diltiazem (CARDIZEM CD) 24 hr capsule 180 mg  180 mg Oral Daily Sanjuan Dame, MD   180 mg at 07/30/21 1031   hydrocortisone (ANUSOL-HC) suppository 25 mg  25 mg Rectal QHS Sanjuan Dame, MD   25 mg at 07/29/21 2218   insulin aspart (novoLOG) injection 0-15 Units  0-15 Units Subcutaneous TID WC Sanjuan Dame, MD       isosorbide mononitrate (IMDUR) 24 hr tablet 30 mg  30 mg Oral Daily Sanjuan Dame, MD   30 mg at 07/30/21 1031   lactated ringers infusion   Intravenous Continuous Long, Wonda Olds, MD 75 mL/hr at 07/30/21 0820 New Bag at 07/30/21 0820   metoprolol tartrate (LOPRESSOR) tablet 12.5 mg  12.5 mg Oral BID Sanjuan Dame, MD   12.5 mg at 07/30/21 1031   pantoprazole (PROTONIX) EC tablet 40 mg  40 mg Oral BID Sanjuan Dame, MD   40 mg at 07/30/21 1031   polycarbophil (FIBERCON) tablet 625 mg  625 mg Oral  Daily Sanjuan Dame, MD   625 mg at 07/30/21 1031   polyethylene glycol (MIRALAX / GLYCOLAX) packet 17 g  17 g Oral Daily PRN Sanjuan Dame, MD       pravastatin (PRAVACHOL) tablet 40 mg  40 mg Oral Daily Sanjuan Dame, MD   40 mg at 07/30/21 1031    Allergies as of 07/29/2021 - Review Complete 07/29/2021  Allergen Reaction Noted   Lisinopril Swelling and Other (See Comments) 12/08/2017    Family History  Problem Relation Age of Onset   Diabetes Mother    Heart Problems Mother     Social History   Socioeconomic History   Marital status: Widowed    Spouse name: Not on file   Number of children: Not on file   Years of education: Not on file   Highest education level: Not on file  Occupational History   Not on file  Tobacco  Use   Smoking status: Never   Smokeless tobacco: Never  Vaping Use   Vaping Use: Never used  Substance and Sexual Activity   Alcohol use: No   Drug use: Never   Sexual activity: Not on file  Other Topics Concern   Not on file  Social History Narrative   Patient is widowed   Denies alcohol tobacco use   Lives with adult son   Social Determinants of Health   Financial Resource Strain: Not on file  Food Insecurity: Not on file  Transportation Needs: Not on file  Physical Activity: Not on file  Stress: Not on file  Social Connections: Not on file  Intimate Partner Violence: Not on file    Review of Systems: Pertinent positive and negative review of systems were noted in the above HPI section.  All other review of systems was otherwise negative.   Physical Exam: Vital signs in last 24 hours: Temp:  [97.5 F (36.4 C)-98.4 F (36.9 C)] 97.6 F (36.4 C) (06/16 0746) Pulse Rate:  [54-107] 94 (06/16 0746) Resp:  [16-24] 17 (06/16 0746) BP: (111-151)/(67-95) 126/80 (06/16 0746) SpO2:  [96 %-100 %] 100 % (06/16 0746)   General:   Alert,  Well-developed, well-nourished, very elderly African-American female pleasant and cooperative in NAD.  Family at bedside Head:  Normocephalic and atraumatic. Eyes:  Sclera clear, no icterus.   Conjunctiva pink. Ears:  Normal auditory acuity. Nose:  No deformity, discharge,  or lesions. Mouth:  No deformity or lesions.   Neck:  Supple; no masses or thyromegaly. Lungs:  Clear throughout to auscultation.   No wheezes, crackles, or rhonchi.  Heart:  irRegular rate and rhythm; no murmurs, clicks, rubs,  or gallops. Abdomen:  Soft,nontender, BS active,nonpalp mass or hsm.   Rectal: Not repeated today, this was done per ER last evening, small external hemorrhoids, no gross blood on exam Msk:  Symmetrical without gross deformities. . Pulses:  Normal pulses noted. Extremities:  Without clubbing or edema. Neurologic:  Alert and  oriented x4;  grossly  normal neurologically. Skin:  Intact without significant lesions or rashes.. Psych:  Alert and cooperative. Normal mood and affect.  Intake/Output from previous day: 06/15 0701 - 06/16 0700 In: 240 [P.O.:240] Out: -  Intake/Output this shift: Total I/O In: -  Out: 400 [Urine:400]  Lab Results: Recent Labs    07/29/21 0814 07/30/21 0342  WBC 8.5 7.2  HGB 9.9* 9.2*  HCT 31.1* 27.9*  PLT 348 307   BMET Recent Labs    07/29/21 0814 07/30/21 0342  NA 140  141  K 4.0 3.7  CL 106 108  CO2 22 23  GLUCOSE 156* 121*  BUN 18 18  CREATININE 1.51* 1.24*  CALCIUM 9.1 8.8*   LFT Recent Labs    07/29/21 0814  PROT 7.3  ALBUMIN 3.6  AST 20  ALT 11  ALKPHOS 69  BILITOT 1.0   PT/INR Recent Labs    07/29/21 1735  LABPROT 17.8*  INR 1.5*   Hepatitis Panel No results for input(s): "HEPBSAG", "HCVAB", "HEPAIGM", "HEPBIGM" in the last 72 hours.  IMPRESSION:   #41 88 year old African-American female admitted with recurrent hematochezia in setting of Eliquis.  She has not manifested any significant bleeding, and hemoglobin has remained stable. Bleeding is very likely secondary to hemorrhoids internal versus external Previous colonoscopy suggested degree of rectal prolapse but no rectal ulcers noted on very recent flex sig last week.  Patient did have prior hemorrhoidal banding x3 per Dr. Carlean Purl in 2019. She also has documented pandiverticulosis however current bleeding is not suggestive of diverticular bleeding  #2 history of adenomatous polyps #3 anemia normocytic chronic #4 recent CVA May 2023-on Eliquis-likewise has been on hold before since Wednesday evening #5 atrial fibrillation #6 cholelithiasis #7  2.6 cm multiseptated cystic lesion of the pancreatic neck, felt consistent with serous cystadenoma per MRI/MRCP June 2023   PLAN: Dr. Carlean Purl will proceed with hemorrhoidal banding later today/unsedated Fleets enema preprocedure Will be okay to resume  Eliquis    Willowdean Luhmann [place] PA-C 07/30/2021, 11:36 AM

## 2021-07-31 ENCOUNTER — Encounter (HOSPITAL_COMMUNITY): Payer: Self-pay | Admitting: Internal Medicine

## 2021-07-31 DIAGNOSIS — K642 Third degree hemorrhoids: Secondary | ICD-10-CM | POA: Diagnosis not present

## 2021-07-31 DIAGNOSIS — K649 Unspecified hemorrhoids: Secondary | ICD-10-CM

## 2021-07-31 DIAGNOSIS — K869 Disease of pancreas, unspecified: Secondary | ICD-10-CM

## 2021-07-31 HISTORY — DX: Disease of pancreas, unspecified: K86.9

## 2021-07-31 LAB — BASIC METABOLIC PANEL
Anion gap: 8 (ref 5–15)
BUN: 16 mg/dL (ref 8–23)
CO2: 22 mmol/L (ref 22–32)
Calcium: 8.5 mg/dL — ABNORMAL LOW (ref 8.9–10.3)
Chloride: 107 mmol/L (ref 98–111)
Creatinine, Ser: 1.27 mg/dL — ABNORMAL HIGH (ref 0.44–1.00)
GFR, Estimated: 40 mL/min — ABNORMAL LOW (ref >60)
Glucose, Bld: 106 mg/dL — ABNORMAL HIGH (ref 70–99)
Potassium: 3.9 mmol/L (ref 3.5–5.1)
Sodium: 137 mmol/L (ref 135–145)

## 2021-07-31 LAB — CBC
HCT: 26.7 % — ABNORMAL LOW (ref 36.0–46.0)
Hemoglobin: 8.4 g/dL — ABNORMAL LOW (ref 12.0–15.0)
MCH: 29.4 pg (ref 26.0–34.0)
MCHC: 31.5 g/dL (ref 30.0–36.0)
MCV: 93.4 fL (ref 80.0–100.0)
Platelets: 291 10*3/uL (ref 150–400)
RBC: 2.86 MIL/uL — ABNORMAL LOW (ref 3.87–5.11)
RDW: 13.3 % (ref 11.5–15.5)
WBC: 7.6 10*3/uL (ref 4.0–10.5)
nRBC: 0 % (ref 0.0–0.2)

## 2021-07-31 LAB — GLUCOSE, CAPILLARY
Glucose-Capillary: 103 mg/dL — ABNORMAL HIGH (ref 70–99)
Glucose-Capillary: 179 mg/dL — ABNORMAL HIGH (ref 70–99)

## 2021-07-31 LAB — HEMOGLOBIN AND HEMATOCRIT, BLOOD
HCT: 26.8 % — ABNORMAL LOW (ref 36.0–46.0)
Hemoglobin: 8.5 g/dL — ABNORMAL LOW (ref 12.0–15.0)

## 2021-07-31 MED ORDER — APIXABAN 5 MG PO TABS: 5.0000 mg | ORAL_TABLET | Freq: Two times a day (BID) | ORAL | 0 refills | Status: DC

## 2021-07-31 MED ORDER — POLYETHYLENE GLYCOL 3350 17 G PO PACK
17.0000 g | PACK | Freq: Every day | ORAL | Status: DC
Start: 2021-07-31 — End: 2021-07-31
  Filled 2021-07-31: qty 1

## 2021-07-31 MED ORDER — SENNOSIDES-DOCUSATE SODIUM 8.6-50 MG PO TABS
1.0000 | ORAL_TABLET | Freq: Every day | ORAL | Status: DC
Start: 2021-07-31 — End: 2021-07-31
  Administered 2021-07-31: 1 via ORAL
  Filled 2021-07-31: qty 1

## 2021-07-31 NOTE — Progress Notes (Signed)
NAME:  Patricia English, MRN:  [id number], DOB:  1928-01-13, LOS: 0 ADMISSION DATE:  07/29/2021  Subjective  Patient evaluated at bedside this AM. Reports she is doing well this morning, no pain. Mentions she has not had a BM but has not noticed any bleeding. Discussed plan to repeat blood counts later, possibly discharge later today.   Objective   Blood pressure (!) 128/53, pulse 83, temperature 97.6 F (36.4 C), temperature source Oral, resp. rate 16, height '5\' 9"'$  (1.753 m), weight 74.8 kg, SpO2 100 %.     Intake/Output Summary (Last 24 hours) at 07/31/2021 0950 Last data filed at 07/31/2021 0500 Gross per 24 hour  Intake --  Output 1200 ml  Net -1200 ml   Filed Weights   07/29/21 0811  Weight: 74.8 kg   Physical Exam: General: Resting comfortably in no acute distress CV: Regular rate, rhythm. 2/6 systolic murmur present Pulm: Normal respiratory effort on room air. Clear to ausculation bilaterally. Abdomen: Soft, non-tender, non-distended. Normoactive bowel sounds Neuro: Awake, alert, conversing appropriately. Grossly non-focal Psych: Normal mood, affect, speech  Labs       Latest Ref Rng & Units 07/31/2021   12:42 AM 07/30/2021    3:42 AM 07/29/2021    8:14 AM  CBC  WBC 4.0 - 10.5 K/uL 7.6  7.2  8.5   Hemoglobin 12.0 - 15.0 g/dL 8.4  9.2  9.9   Hematocrit 36.0 - 46.0 % 26.7  27.9  31.1   Platelets 150 - 400 K/uL 291  307  348       Latest Ref Rng & Units 07/31/2021   12:42 AM 07/30/2021    3:42 AM 07/29/2021    8:14 AM  BMP  Glucose 70 - 99 mg/dL 106  121  156   BUN 8 - 23 mg/dL '16  18  18   '$ Creatinine 0.44 - 1.00 mg/dL 1.27  1.24  1.51   Sodium 135 - 145 mmol/L 137  141  140   Potassium 3.5 - 5.1 mmol/L 3.9  3.7  4.0   Chloride 98 - 111 mmol/L 107  108  106   CO2 22 - 32 mmol/L '22  23  22   '$ Calcium 8.9 - 10.3 mg/dL 8.5  8.8  9.1     Summary   Patricia English is 88yo person with multiple recent hospitalizations for recurrent hemorrhoidal and diverticular  bleeds, previous embolic CVA, atrial fibrillation on Eliquis, type II diabetes mellitus, hypertension admitted 6/15 for acute hemorrhoidal bleeding, now s/p banding.  Assessment & Plan:  Principal Problem:   Acute lower GI bleeding Active Problems:   DM2 (diabetes mellitus, type 2) (HCC)   Essential hypertension   Chronic kidney disease (CKD)   Atrial fibrillation (HCC)   Coronary artery disease   Hyperlipidemia  #Recurrent hemorrhoidal bleeding #Grade III internal, external hemorrhoids s/p banding Yesterday had banding of hemorrhoids. [name] this morning has not had a bowel movement, no evidence of further bleeding. She is not in pain and feels comfortable. Hgb did drop, 9>8.4 this AM. We will continue holding Eliquis, repeat H/H early this afternoon. Will touch base with GI, if okay with them and Hgb stable this afternoon will discharge with close follow-up. - Repeat H/H this afternoon, will decide  - Continue pantoprazole '40mg'$  twice daily - Continue holding apixaban - can re-start 6/19 - Follow-up with GI outpatient - Continue Fibercon, Miralax  #History of embolic CVA #Atrial fibrillation on Eliquis CHA2D2-VASC 7, HAS-BLED 4. Will continue  holding anticoagulation for three more days per GI. Given she has not had any major bleeding, would likely recommend continuing Eliquis for stroke prevention. She only meets 1/3 criteria for reduced dosing, concerned this reduced dose would put her at an increased risk of both GI bleeding and stroke.  - Can re-start Eliquis on Monday, 6/19 - Continue home diltiazem '180mg'$  daily - Continue home metoprolol tartrate 12.'5mg'$  twice daily  #Chronic kidney disease stage IIIb Renal function almost back to baseline, stable from yesterday.  - Daily BMP  #Type II diabetes mellitus A1c 6.3%. Sugars have been well-controlled. - SSI  #Hypertension BP at goal, no changes at this time. - Diltiazem '180mg'$  daily - Isosorbide mononitrate '30mg'$  daily -  Metoprolol tartrate 12.'5mg'$  twice daily  Best practice:  DIET: CM IVF: n/a DVT PPX: SCD BOWEL: Fibercon, Miralax CODE: FULL FAM COM: Will update family after discussing with GI  Sanjuan Dame, MD Internal Medicine Resident PGY-2 PAGER: [phone] 07/31/2021 9:50 AM  If after hours (below), please contact on-call pager: [phone] 5PM-7AM Monday-Friday 1PM-7AM Saturday-Sunday

## 2021-07-31 NOTE — Progress Notes (Signed)
Mobility Specialist Progress Note:   07/31/21 0955  Mobility  Activity Ambulated with assistance in hallway  Level of Assistance Contact guard assist, steadying assist  Assistive Device Front wheel walker  Distance Ambulated (ft) 150 ft  Activity Response Tolerated well  $Mobility charge 1 Mobility   Pt agreeable to mobility. Required min guard assist throughout. Pt c/o SOB with exertion, SpO2 96% on RA. Pt back in chair with all needs met.   Nelta Numbers Acute Rehab Secure Chat or Office Phone: [phone]

## 2021-07-31 NOTE — Discharge Summary (Signed)
Name: Patricia English MRN: [id number] DOB: 1928-01-08 88 y.o. PCP: [name], MD  Date of Admission: 07/29/2021  7:54 AM Date of Discharge: 07/31/2021 Attending Physician: Charise Killian, MD  Discharge Diagnosis: 1. Recurrent hemorrhoidal bleeding 2. Acute kidney injury on CKDIIIb 3. Atrial fibrillation 4. Hypertension 5. Type II diabetes mellitus  Discharge Medications: Allergies as of 07/31/2021       Reactions   Lisinopril Swelling, Other (See Comments)   This caused laryngeal edema        Medication List     STOP taking these medications    benzonatate 100 MG capsule Commonly known as: Tessalon Perles   hydrocortisone 25 MG suppository Commonly known as: ANUSOL-HC       TAKE these medications    apixaban 5 MG Tabs tablet Commonly known as: ELIQUIS Take 1 tablet (5 mg total) by mouth 2 (two) times daily. Start taking on: August 03, 2021 What changed: These instructions start on August 03, 2021. If you are unsure what to do until then, ask your doctor or other care provider.   Cholecalciferol 25 MCG (1000 UT) tablet Take 1,000 Units by mouth daily.   diltiazem 180 MG 24 hr capsule Commonly known as: DILACOR XR Take 1 capsule (180 mg total) by mouth daily. What changed: when to take this   FiberCon 625 MG tablet Generic drug: polycarbophil Take 1 tablet (625 mg total) by mouth daily.   furosemide 40 MG tablet Commonly known as: LASIX Take 1 tablet (40 mg total) by mouth every Monday, Wednesday, and Friday.   isosorbide mononitrate 30 MG 24 hr tablet Commonly known as: IMDUR Take 30 mg by mouth in the morning.   metoprolol tartrate 25 MG tablet Commonly known as: LOPRESSOR Take 0.5 tablets (12.5 mg total) by mouth 2 (two) times daily.   pantoprazole 40 MG tablet Commonly known as: PROTONIX Take 1 tablet (40 mg total) by mouth 2 (two) times daily. What changed: when to take this   polyethylene glycol 17 g packet Commonly known as: MIRALAX /  GLYCOLAX Take 17 g by mouth daily as needed for moderate constipation.   pravastatin 40 MG tablet Commonly known as: PRAVACHOL Take 1 tablet (40 mg total) by mouth daily.        Disposition and follow-up:   PatriciaPatricia English was discharged from [facility] in Stable condition.  At the hospital follow up visit please address:  1.  Recurrent hemorrhoidal bleeding: Hgb 8.5 on discharge. Had banding done 6/16, might have minor bleeding afterward, but please make sure no significant bleeding since discharge. Had discussion around Eliquis dosing in hospital - only meets 1/3 criteria for lower dose. Since no major bleeds, was planning to continue full dose, can re-start Eliquis 08/03/21. She has GI follow-up in August.  2. AKI on CKDIIIb: Mild, back to baseline by discharge. Please make sure she has been eating and drinking well.  3.  Labs / imaging needed at time of follow-up: CBC, BMP  4.  Pending labs/ test needing follow-up: n/a  Follow-up Appointments:  Follow-up [place], [facility] Follow up.   Contact information: [place] [address] [phone]         [place]. Schedule an appointment as soon as possible for a visit.   Why: Please make an appointment to be seen in the Internal Medicine Clinic this week. We will give you a call to schedule  this. Contact information: [address] [place] [phone]        Frann Rider, NP. Go on 08/10/2021.   Specialty: Neurology Why: Your appointment is at 10:15AM. Please arrrive by at least 9:45AM. Contact information: [address] [place] [address] [phone]         [name], MD. Go on 09/16/2021.   Specialty: Gastroenterology Why: You have an appointment with Dr. Carlean Purl on 09/16/2021 at 11:10AM. Contact information: [address] [place]  [address] [phone]                 Hospital Course by problem list:  #Recurrent hemorrhoidal bleeding #Grade III internal and external hemorrhoids #History of embolic stroke on anticoagulation Patricia English arrived to [facility] after another episode of lower GI bleeding. She has been hemodynamically stable since arrival. She is not symptomatic and her Hgb has improved from last discharge, 9.9<9.1. She did have episode of bright red blood per rectum, most likely hemorrhoidal. Patricia English is well-known to GI, as this is her third admission for the same problem. During her last admission, flexible sigmoidoscopy revealed multiple internal and external hemorrhoids along with many diverticula. During previous admission, there was discussion regarding reducing her Eliquis dose. However, she only meets one criteria (age). During this hospitalization, GI was consulted and performed hemorrhoid banding on 6/16. After this, patient had no further episodes of bleeding and was stable for discharge on 6/17. Planning to continue holding Eliquis until 08/03/21. Given she has only had minor bleeding, would continue with full dose.    #Mild AKI on CKDIIIb Initial lab work revealed sCr 1.5 w/ GFR 32. Baseline renal function sCr 1.1-1.2. Mild AKI likely 2/2 dehydration. She was given fluids by the ED and creatinine improved to baseline.   #Atrial fibrillation on Eliquis EKG showed atrial fibrillation with controlled rates. This remained stable throughout hospitalization. Eliquis was held from admission through discharge.    #Hypertension Patient normotensive since arrival. Remained at goal on home regimen.   #Type II diabetes mellitus Sugars remained well-controlled throughout admission.   #Coronary artery disease #Hyperlipidemia Previous LHC in 2012 w/ minimal CAD. Last LDL 79. Continued with home statin.  Discharge Exam:   BP (!) 128/53 (BP Location: Right Arm)   Pulse 83   Temp 97.6 F (36.4 C) (Oral)    Resp 16   Ht '5\' 9"'$  (1.753 m)   Wt 74.8 kg   SpO2 100%   BMI 24.37 kg/m  Discharge exam:  General: Resting comfortably in no acute distress CV: Regular rate, irregular rhythm. 2/6 systolic murmur appreciated.  Pulm: Normal respiratory effort on room air. Clear to ausculation bilaterally. GI: Abdomen soft, non-tender, non-distended. Normoactive bowel sounds. Neuro: Awake, alert, conversing appropriately. Grossly non-focal. Psych: Normal mood, affect, speech.  Pertinent Labs, Studies, and Procedures:     Latest Ref Rng & Units 07/31/2021   12:42 AM 07/30/2021    3:42 AM 07/29/2021    8:14 AM  CBC  WBC 4.0 - 10.5 K/uL 7.6  7.2  8.5   Hemoglobin 12.0 - 15.0 g/dL 12.0 - 15.0 g/dL 8.4    8.5  9.2  9.9   Hematocrit 36.0 - 46.0 % 36.0 - 46.0 % 26.7    26.8  27.9  31.1   Platelets 150 - 400 K/uL 291  307  348       Latest Ref Rng & Units 07/31/2021   12:42 AM 07/30/2021    3:42  AM 07/29/2021    8:14 AM  BMP  Glucose 70 - 99 mg/dL 106  121  156   BUN 8 - 23 mg/dL '16  18  18   '$ Creatinine 0.44 - 1.00 mg/dL 1.27  1.24  1.51   Sodium 135 - 145 mmol/L 137  141  140   Potassium 3.5 - 5.1 mmol/L 3.9  3.7  4.0   Chloride 98 - 111 mmol/L 107  108  106   CO2 22 - 32 mmol/L '22  23  22   '$ Calcium 8.9 - 10.3 mg/dL 8.5  8.8  9.1    Hemorrhoid banding performed 6/16 by Dr. Carlean Purl  Discharge Instructions: Discharge Instructions     Call MD for:  difficulty breathing, headache or visual disturbances   Complete by: As directed    Call MD for:  extreme fatigue   Complete by: As directed    Call MD for:  hives   Complete by: As directed    Call MD for:  persistant dizziness or light-headedness   Complete by: As directed    Call MD for:  persistant nausea and vomiting   Complete by: As directed    Call MD for:  redness, tenderness, or signs of infection (pain, swelling, redness, odor or green/yellow discharge around incision site)   Complete by: As directed    Call MD for:  severe  uncontrolled pain   Complete by: As directed    Call MD for:  temperature >100.4   Complete by: As directed    Diet - low sodium heart healthy   Complete by: As directed    Discharge instructions   Complete by: As directed    Ms. Kuehnel, I am so glad you are feeling better and are being discharged home today! You were admitted due to more bleeding from your hemorrhoids. Yesterday, June 16th, our gastroenterology doctors put bands on the hemorrhoids, hopefully stopping anymore bleeding. Please see the following notes:  - You might see some minor bleeding in the days following the procedure. This is normal. If you see large volume bleeding, please return to the Emergency Department.  - We would like for you to continue holding your Eliquis. You may re-start taking Eliquis on Tuesday, June 20th.   - You do not need to take the suppositories anymore.   - Other medications, including pravastatin, pantoprazole, metoprolol, isosorbide mononitrate, diltiazem, and furosemide are unchanged - continue taking these as you were prior.  - We would like for you to follow-up with our clinic early this next week. They should call on Monday afternoon to schedule this. The clinic is called the [facility] and it is located in the lower ground of the hospital (same floor as the cafeteria). We want to make sure you have not had any further bleeding and to check your lab work. We can see you for just one visit for a hospital follow-up, or if you would prefer to move your care, we would be happy to have you establish with [place]. We will leave that decision up to you and your family!   It was a pleasure meeting you and your family, Ms. Potts. I wish you all the best and hope you stay happy and healthy! We will see you in clinic!  Thank you, Sanjuan Dame, MD   Increase activity slowly   Complete by: As directed        Signed: Sanjuan Dame, MD 07/31/2021, 1:09 PM   Pager:  [phone]  

## 2021-07-31 NOTE — Progress Notes (Signed)
Patient discharged to home with instructions given and read to patient and her daughter on the speaker phone.

## 2021-07-31 NOTE — Progress Notes (Signed)
   Patient Name: Patricia English Date of Encounter: 07/31/2021, 11:32 AM    Subjective  Had a BM and no bleeding   Objective  BP (!) 128/53 (BP Location: Right Arm)   Pulse 83   Temp 97.6 F (36.4 C) (Oral)   Resp 16   Ht '5\' 9"'$  (1.753 m)   Wt 74.8 kg   SpO2 100%   BMI 24.37 kg/m  NAD    Latest Ref Rng & Units 07/31/2021   12:42 AM 07/30/2021    3:42 AM 07/29/2021    8:14 AM  CBC  WBC 4.0 - 10.5 K/uL 7.6  7.2  8.5   Hemoglobin 12.0 - 15.0 g/dL 8.4  9.2  9.9   Hematocrit 36.0 - 46.0 % 26.7  27.9  31.1   Platelets 150 - 400 K/uL 291  307  348        Assessment and Plan  Bleeding prolapsed hemorrhoids - improved after banidng yesterday Anti-coag after Afib-related stroke Suspected serous cystadenoma of pancreas - known from imaging PTA  ==========================================  Ok to dc by me  Restart Eliquis 6/20  Stay on Miralax  She has f/u me August and we can see if she needs more banding then  First do no harm re: pancreatic lesion - no need to f/u at 51 or intervene unless causing problems - we discussed  [name], MD, [facility] See [name] on call - gastroenterology for best contact person 07/31/2021 11:32 AM

## 2021-07-31 NOTE — TOC Transition Note (Signed)
Transition of Care [facility]) - CM/SW Discharge Note   Patient Details  Name: [name] MRN: [id number] Date of Birth: May 28, 1927  [facility] CM/SW Contact:  Bartholomew Crews, RN Phone Number: [phone] 07/31/2021, 1:37 PM   Clinical Narrative:     Patient to transition home today. Previous RNCM verified active status with [facility]. Liaison, Corene Cornea, notified of discharge today. No further TOC needs identified at this time.   Final next level of care: [place] Barriers to Discharge: No Barriers Identified   Patient Goals and CMS Choice Patient states their goals for this hospitalization and ongoing recovery are:: to return to home CMS Medicare.gov Compare Post Acute Care list provided to:: Patient Choice offered to / list presented to : Patient  Discharge Placement                       Discharge Plan and Services   Discharge Planning Services: CM Consult Post Acute Care Choice: Home Health            DME Agency: NA       HH Arranged: PT, OT [place] Agency: [facility] ([facility]) Date HH Agency Contacted: 07/30/21 Time [place]: [place] Representative spoke with at [place]: [place] Determinants of Health ([place]) Interventions     Readmission Risk Interventions     No data to display

## 2021-08-02 ENCOUNTER — Other Ambulatory Visit: Payer: Self-pay

## 2021-08-02 ENCOUNTER — Encounter (HOSPITAL_COMMUNITY): Payer: Self-pay | Admitting: Internal Medicine

## 2021-08-02 ENCOUNTER — Inpatient Hospital Stay (HOSPITAL_COMMUNITY)
Admission: EM | Admit: 2021-08-02 | Discharge: 2021-08-05 | DRG: 394 | Disposition: A | Discharge status: Home Health | Payer: Medicare Other | Attending: Internal Medicine | Admitting: Internal Medicine

## 2021-08-02 DIAGNOSIS — K625 Hemorrhage of anus and rectum: Secondary | ICD-10-CM | POA: Diagnosis present

## 2021-08-02 DIAGNOSIS — N1832 Chronic kidney disease, stage 3b: Secondary | ICD-10-CM | POA: Diagnosis present

## 2021-08-02 DIAGNOSIS — E1122 Type 2 diabetes mellitus with diabetic chronic kidney disease: Secondary | ICD-10-CM | POA: Diagnosis present

## 2021-08-02 DIAGNOSIS — I4821 Permanent atrial fibrillation: Secondary | ICD-10-CM | POA: Diagnosis present

## 2021-08-02 DIAGNOSIS — K5900 Constipation, unspecified: Secondary | ICD-10-CM | POA: Diagnosis present

## 2021-08-02 DIAGNOSIS — N179 Acute kidney failure, unspecified: Secondary | ICD-10-CM | POA: Diagnosis present

## 2021-08-02 DIAGNOSIS — K649 Unspecified hemorrhoids: Principal | ICD-10-CM

## 2021-08-02 DIAGNOSIS — Z79899 Other long term (current) drug therapy: Secondary | ICD-10-CM

## 2021-08-02 DIAGNOSIS — Z888 Allergy status to other drugs, medicaments and biological substances status: Secondary | ICD-10-CM

## 2021-08-02 DIAGNOSIS — I129 Hypertensive chronic kidney disease with stage 1 through stage 4 chronic kidney disease, or unspecified chronic kidney disease: Secondary | ICD-10-CM | POA: Diagnosis present

## 2021-08-02 DIAGNOSIS — I2510 Atherosclerotic heart disease of native coronary artery without angina pectoris: Secondary | ICD-10-CM | POA: Diagnosis present

## 2021-08-02 DIAGNOSIS — K648 Other hemorrhoids: Principal | ICD-10-CM | POA: Diagnosis present

## 2021-08-02 DIAGNOSIS — K644 Residual hemorrhoidal skin tags: Secondary | ICD-10-CM | POA: Diagnosis present

## 2021-08-02 DIAGNOSIS — Z833 Family history of diabetes mellitus: Secondary | ICD-10-CM

## 2021-08-02 DIAGNOSIS — Z8673 Personal history of transient ischemic attack (TIA), and cerebral infarction without residual deficits: Secondary | ICD-10-CM

## 2021-08-02 DIAGNOSIS — L89151 Pressure ulcer of sacral region, stage 1: Secondary | ICD-10-CM | POA: Diagnosis present

## 2021-08-02 DIAGNOSIS — K219 Gastro-esophageal reflux disease without esophagitis: Secondary | ICD-10-CM | POA: Diagnosis present

## 2021-08-02 DIAGNOSIS — D500 Iron deficiency anemia secondary to blood loss (chronic): Secondary | ICD-10-CM | POA: Diagnosis present

## 2021-08-02 LAB — TYPE AND SCREEN
ABO/RH(D): O POS
Antibody Screen: NEGATIVE

## 2021-08-02 LAB — COMPREHENSIVE METABOLIC PANEL
ALT: 9 U/L (ref 0–44)
AST: 17 U/L (ref 15–41)
Albumin: 3.4 g/dL — ABNORMAL LOW (ref 3.5–5.0)
Alkaline Phosphatase: 63 U/L (ref 38–126)
Anion gap: 9 (ref 5–15)
BUN: 14 mg/dL (ref 8–23)
CO2: 20 mmol/L — ABNORMAL LOW (ref 22–32)
Calcium: 8.9 mg/dL (ref 8.9–10.3)
Chloride: 110 mmol/L (ref 98–111)
Creatinine, Ser: 1.51 mg/dL — ABNORMAL HIGH (ref 0.44–1.00)
GFR, Estimated: 32 mL/min — ABNORMAL LOW (ref >60)
Glucose, Bld: 230 mg/dL — ABNORMAL HIGH (ref 70–99)
Potassium: 3.7 mmol/L (ref 3.5–5.1)
Sodium: 139 mmol/L (ref 135–145)
Total Bilirubin: 0.7 mg/dL (ref 0.3–1.2)
Total Protein: 7.1 g/dL (ref 6.5–8.1)

## 2021-08-02 LAB — CBC WITH DIFFERENTIAL/PLATELET
Abs Immature Granulocytes: 0.04 10*3/uL (ref 0.00–0.07)
Basophils Absolute: 0 10*3/uL (ref 0.0–0.1)
Basophils Relative: 0 %
Eosinophils Absolute: 0.3 10*3/uL (ref 0.0–0.5)
Eosinophils Relative: 3 %
HCT: 28.4 % — ABNORMAL LOW (ref 36.0–46.0)
Hemoglobin: 8.8 g/dL — ABNORMAL LOW (ref 12.0–15.0)
Immature Granulocytes: 0 %
Lymphocytes Relative: 23 %
Lymphs Abs: 2.2 10*3/uL (ref 0.7–4.0)
MCH: 28.9 pg (ref 26.0–34.0)
MCHC: 31 g/dL (ref 30.0–36.0)
MCV: 93.4 fL (ref 80.0–100.0)
Monocytes Absolute: 0.9 10*3/uL (ref 0.1–1.0)
Monocytes Relative: 10 %
Neutro Abs: 6 10*3/uL (ref 1.7–7.7)
Neutrophils Relative %: 64 %
Platelets: 325 10*3/uL (ref 150–400)
RBC: 3.04 MIL/uL — ABNORMAL LOW (ref 3.87–5.11)
RDW: 13.5 % (ref 11.5–15.5)
WBC: 9.5 10*3/uL (ref 4.0–10.5)
nRBC: 0 % (ref 0.0–0.2)

## 2021-08-02 NOTE — ED Triage Notes (Signed)
Patient reports bright red rectal bleeding today , denies abdominal pain / no emesis .

## 2021-08-02 NOTE — ED Provider Triage Note (Signed)
Emergency Medicine Provider Triage Evaluation Note  Patricia English , a 88 y.o. female  was evaluated in triage.  Pt complains of ` bright red blood per rectum x2 episodes today.  Patient has been seen several times in the past few weeks for similar symptomS.  He was told to hold her anticoagulation until tomorrow.  She was discharged 3 days ago for similar symptoms.  Review of Systems  Positive: Bright red blood per rectum Negative: Abdominal pain  Physical Exam  BP 131/61 (BP Location: Right Arm)   Pulse 70   Temp 99 F (37.2 C) (Oral)   Resp 18   SpO2 100%  Gen:   Awake, no distress   Resp:  Normal effort  MSK:   Moves extremities without difficulty  Other:  Abdomen is soft  Medical Decision Making  Medically screening exam initiated at 9:22 PM.  Appropriate orders placed.  Patricia English was informed that the remainder of the evaluation will be completed by another provider, this initial triage assessment does not replace that evaluation, and the importance of remaining in the ED until their evaluation is complete.  We will order labs, brief chart review reveals that her bleeding is likely from hemorrhoids and/or diverticula?   Delia Heady, PA-C 08/02/21 2123

## 2021-08-03 DIAGNOSIS — K625 Hemorrhage of anus and rectum: Secondary | ICD-10-CM | POA: Diagnosis present

## 2021-08-03 LAB — CBG MONITORING, ED
Glucose-Capillary: 122 mg/dL — ABNORMAL HIGH (ref 70–99)
Glucose-Capillary: 139 mg/dL — ABNORMAL HIGH (ref 70–99)

## 2021-08-03 LAB — CBC WITH DIFFERENTIAL/PLATELET
Abs Immature Granulocytes: 0.04 10*3/uL (ref 0.00–0.07)
Basophils Absolute: 0.1 10*3/uL (ref 0.0–0.1)
Basophils Relative: 1 %
Eosinophils Absolute: 0.3 10*3/uL (ref 0.0–0.5)
Eosinophils Relative: 3 %
HCT: 31.3 % — ABNORMAL LOW (ref 36.0–46.0)
Hemoglobin: 9.9 g/dL — ABNORMAL LOW (ref 12.0–15.0)
Immature Granulocytes: 0 %
Lymphocytes Relative: 23 %
Lymphs Abs: 2.6 10*3/uL (ref 0.7–4.0)
MCH: 29.2 pg (ref 26.0–34.0)
MCHC: 31.6 g/dL (ref 30.0–36.0)
MCV: 92.3 fL (ref 80.0–100.0)
Monocytes Absolute: 0.9 10*3/uL (ref 0.1–1.0)
Monocytes Relative: 8 %
Neutro Abs: 7.1 10*3/uL (ref 1.7–7.7)
Neutrophils Relative %: 65 %
Platelets: 319 10*3/uL (ref 150–400)
RBC: 3.39 MIL/uL — ABNORMAL LOW (ref 3.87–5.11)
RDW: 13.4 % (ref 11.5–15.5)
WBC: 11 10*3/uL — ABNORMAL HIGH (ref 4.0–10.5)
nRBC: 0 % (ref 0.0–0.2)

## 2021-08-03 MED ORDER — ISOSORBIDE MONONITRATE ER 30 MG PO TB24
30.0000 mg | ORAL_TABLET | Freq: Every morning | ORAL | Status: DC
  Administered 2021-08-03 – 2021-08-05 (×3): 30 mg via ORAL
  Filled 2021-08-03 (×3): qty 1

## 2021-08-03 MED ORDER — DILTIAZEM HCL ER COATED BEADS 180 MG PO CP24
180.0000 mg | ORAL_CAPSULE | Freq: Every day | ORAL | Status: DC
  Administered 2021-08-03 – 2021-08-05 (×3): 180 mg via ORAL
  Filled 2021-08-03 (×3): qty 1

## 2021-08-03 MED ORDER — ONDANSETRON HCL 4 MG PO TABS: 4.0000 mg | ORAL_TABLET | Freq: Four times a day (QID) | ORAL | Status: DC | PRN

## 2021-08-03 MED ORDER — FUROSEMIDE 40 MG PO TABS
40.0000 mg | ORAL_TABLET | ORAL | Status: DC
  Administered 2021-08-04: 40 mg via ORAL
  Filled 2021-08-03: qty 1

## 2021-08-03 MED ORDER — ONDANSETRON HCL 4 MG/2ML IJ SOLN: 4.0000 mg | Freq: Four times a day (QID) | INTRAMUSCULAR | Status: DC | PRN

## 2021-08-03 MED ORDER — ADULT MULTIVITAMIN W/MINERALS CH
1.0000 | ORAL_TABLET | Freq: Every day | ORAL | Status: DC
  Administered 2021-08-03 – 2021-08-05 (×3): 1 via ORAL
  Filled 2021-08-03 (×3): qty 1

## 2021-08-03 MED ORDER — INSULIN ASPART 100 UNIT/ML IJ SOLN
0.0000 [IU] | Freq: Three times a day (TID) | INTRAMUSCULAR | Status: DC
  Administered 2021-08-03: 1 [IU] via SUBCUTANEOUS
  Administered 2021-08-04: 3 [IU] via SUBCUTANEOUS
  Administered 2021-08-04: 1 [IU] via SUBCUTANEOUS
  Administered 2021-08-05: 3 [IU] via SUBCUTANEOUS

## 2021-08-03 MED ORDER — SODIUM CHLORIDE 0.9 % IV SOLN
250.0000 mg | Freq: Once | INTRAVENOUS | Status: AC
  Administered 2021-08-03: 250 mg via INTRAVENOUS
  Filled 2021-08-03: qty 20

## 2021-08-03 MED ORDER — ACETAMINOPHEN 325 MG PO TABS: 650.0000 mg | ORAL_TABLET | Freq: Four times a day (QID) | ORAL | Status: DC | PRN

## 2021-08-03 MED ORDER — DILTIAZEM HCL ER 180 MG PO CP24
180.0000 mg | ORAL_CAPSULE | Freq: Every day | ORAL | Status: DC
  Filled 2021-08-03: qty 1

## 2021-08-03 MED ORDER — PANTOPRAZOLE SODIUM 40 MG PO TBEC
40.0000 mg | DELAYED_RELEASE_TABLET | Freq: Two times a day (BID) | ORAL | Status: DC
  Administered 2021-08-03 – 2021-08-05 (×4): 40 mg via ORAL
  Filled 2021-08-03 (×4): qty 1

## 2021-08-03 MED ORDER — CALCIUM POLYCARBOPHIL 625 MG PO TABS
625.0000 mg | ORAL_TABLET | Freq: Every day | ORAL | Status: DC
  Administered 2021-08-04 – 2021-08-05 (×2): 625 mg via ORAL
  Filled 2021-08-03 (×2): qty 1

## 2021-08-03 MED ORDER — PRAVASTATIN SODIUM 40 MG PO TABS
40.0000 mg | ORAL_TABLET | Freq: Every day | ORAL | Status: DC
  Administered 2021-08-03 – 2021-08-04 (×2): 40 mg via ORAL
  Filled 2021-08-03 (×2): qty 1

## 2021-08-03 MED ORDER — POLYETHYLENE GLYCOL 3350 17 G PO PACK: 17.0000 g | PACK | Freq: Every day | ORAL | Status: DC | PRN

## 2021-08-03 MED ORDER — ACETAMINOPHEN 650 MG RE SUPP: 650.0000 mg | Freq: Four times a day (QID) | RECTAL | Status: DC | PRN

## 2021-08-03 MED ORDER — METOPROLOL TARTRATE 12.5 MG HALF TABLET
12.5000 mg | ORAL_TABLET | Freq: Two times a day (BID) | ORAL | Status: DC
  Administered 2021-08-03 – 2021-08-05 (×4): 12.5 mg via ORAL
  Filled 2021-08-03 (×4): qty 1

## 2021-08-03 NOTE — Consult Note (Signed)
[facility] Consult: 12:09 PM 08/03/2021  LOS: 0 days    Referring Provider: Dr Philipp Ovens.    Primary Care Physician:  [name], MD Primary Gastroenterologist:  Dr. Carlean Purl    Reason for Consultation:  recurrent painless, rectal bleeding.  Recent hemorrhoidal banding.     HPI: Patricia English is a 88 y.o. female.  PMH CAD.  A-fib, CVA on Eliquis.  Anemia, on oral iron.  DM 2, on oral agent.  GERD.  On Protonix bid.    01/19/2021 CTAP w contrast: Severe diffuse colonic diverticulosis.  Endometrial thickening recommend ultrasound.  2.4 x 2 cm in determinant hypodense lesion at neck of pancreas, recommend MRI. 07/22/2021 MRI abdomen/MRCP: Multiseptated microcystic lesion at pancreatic head, 2.6 x 2.5 cm. Minimally associated septal enhancement without nodular solid component, findings C/W serous cystadenoma.  Pancreatic parenchymal atrophy and dilation of distal pancreatic duct.  Cholelithiasis.  Colonic diverticulosis.  10/31/2020 colonoscopy with poor prep.  Ascending, transverse, descending, transverse: None.  3, 4 to 8 mm polyps ) path: Tubular adenoma, inflammatory polyp, no HGD) removed from descending, transverse, ascending colon clips placed at site of polypectomies.  A few, nonbleeding rectal ulcers suspected due to rectal prolapse was biopsied (path: "ulcer, no dysplasia or malignancy).  Rectal prolapse and nonbleeding internal hemorrhoids.  No active bleeding. 10/30/2020 EGD.  Normal study.  Gastric biopsy pathology: Gastric antral and oxyntic mucosa with nonspecific reactive gastropathy. Stain negative for Helicobacter pylori.    07/22/2021 flexible sigmoidoscopy for painless rectal bleeding and staging for reinitiation of anticoagulation/Eliquis..  Nonbleeding, nonthrombosed external, internal hemorrhoids.   Diverticulosis in the descending, sigmoid and rectosigmoid.  Stool in the colon.  Treated with Anusol suppositories for the next 3 weeks on with MiraLAX as needed and daily fiber,.  Consider office-based banding procedure. 07/30/2021 flexible sigmoidoscopy with banding of hemorrhoids.  Findings included palpable and visible external and hemorrhoids, grade 3 internal prolapsed hemorrhoids requiring manual reduction).   Just discharged from the hospital Saturday 6/17.   This current encounter/admission represents admission #5 for this year, 6 if you include admission to [facility] inpatient rehab.  GI has been addressing issues of painless hematochezia with flex sigs performed on 6/8 and 616. Dr. Celesta Aver plan was to have her restart Eliquis on 6/20, stay on MiraLAX and follow-up as scheduled in August, consider additional banding if needed.  Given her advanced age, no plans to further work-up or intervene on pancreatic lesion unless it starts to cause problems. Since been off Eliquis for at least 3 weeks since she had the rectal bleeding eval earlier this month. On Sunday had brown, loose stools.  She was taking her fiber supplement but not the MiraLAX.  Yesterday, Monday, at 3 PM had an episode of painless hematochezia.  She thought she was going to have a bowel movement but when she looked down the commode water had turn blood red.  Volume of bleeding consistent with bleeding that occurred earlier this month.  The next incident occurred around 7 AM today.  No dizziness.  No pain.  She feels fine.  No other unusual bleeding.  Has not yet restarted Eliquis, restart planned for today.  Patient has never required blood cell transfusions.  Normotensive 130s to 140s/60s to 80s.  Room air sats upper 90 to 100% on room air.  No fever Hgb 8.8, was 8.5 two d ago, range 8.4 - 9.9 in last 12 days.  MCV normal.  Platelets normal AKI with GFR of 32, previous range 30-46 over previous 8 weeks.  Admitting MD has ordered  Ferrlecit infusion, has not received parenteral iron in the past.  Lives at home in [place], her son lives with her.  Past Medical History:  Diagnosis Date   Coronary artery disease    Diabetes mellitus without complication ([place])    GI bleeding 11/2017   Hypertension    Internal hemorrhoids    Lesion of pancreas 07/31/2021   MR - suspect serous cystadenoma    Past Surgical History:  Procedure Laterality Date   BIOPSY  10/30/2020   Procedure: BIOPSY;  Surgeon: Sharyn Creamer, MD;  Location: [place];  Service: Gastroenterology;;   BIOPSY  10/31/2020   Procedure: BIOPSY;  Surgeon: Sharyn Creamer, MD;  Location: [facility] ENDOSCOPY;  Service: Gastroenterology;;   CATARACT EXTRACTION     COLONOSCOPY WITH PROPOFOL N/A 10/31/2020   Procedure: COLONOSCOPY WITH PROPOFOL;  Surgeon: Sharyn Creamer, MD;  Location: [facility] ENDOSCOPY;  Service: Gastroenterology;  Laterality: N/A;   ESOPHAGOGASTRODUODENOSCOPY (EGD) WITH PROPOFOL N/A 08/13/2017   Procedure: ESOPHAGOGASTRODUODENOSCOPY (EGD) WITH PROPOFOL;  Surgeon: [name], MD;  Location: [place];  Service: Endoscopy;  Laterality: N/A;   ESOPHAGOGASTRODUODENOSCOPY (EGD) WITH PROPOFOL N/A 10/30/2020   Procedure: ESOPHAGOGASTRODUODENOSCOPY (EGD) WITH PROPOFOL;  Surgeon: Sharyn Creamer, MD;  Location: [place];  Service: Gastroenterology;  Laterality: N/A;   FLEXIBLE SIGMOIDOSCOPY N/A 12/10/2017   Procedure: FLEXIBLE SIGMOIDOSCOPY;  Surgeon: [name], MD;  Location: [facility] ENDOSCOPY;  Service: Endoscopy;  Laterality: N/A;   FLEXIBLE SIGMOIDOSCOPY N/A 07/22/2021   Procedure: FLEXIBLE SIGMOIDOSCOPY;  Surgeon: Rush Landmark Telford Nab., MD;  Location: [place];  Service: Gastroenterology;  Laterality: N/A;   FLEXIBLE SIGMOIDOSCOPY N/A 07/30/2021   Procedure: FLEXIBLE SIGMOIDOSCOPY;  Surgeon: [name], MD;  Location: [place];  Service: Gastroenterology;  Laterality: N/A;   HEMORRHOID BANDING  12/10/2017   Procedure: HEMORRHOID  BANDING;  Surgeon: [name], MD;  Location: [place];  Service: Endoscopy;;   HEMORRHOID BANDING  07/30/2021   Procedure: Thayer Jew;  Surgeon: [name], MD;  Location: [place];  Service: Gastroenterology;;   HEMOSTASIS CLIP PLACEMENT  10/31/2020   Procedure: HEMOSTASIS CLIP PLACEMENT;  Surgeon: Sharyn Creamer, MD;  Location: [place];  Service: Gastroenterology;;   POLYPECTOMY  10/31/2020   Procedure: POLYPECTOMY;  Surgeon: Sharyn Creamer, MD;  Location: [facility] ENDOSCOPY;  Service: Gastroenterology;;    Prior to Admission medications   Medication Sig Start Date End Date Taking? Authorizing Provider  apixaban (ELIQUIS) 5 MG TABS tablet Take 1 tablet (5 mg total) by mouth 2 (two) times daily. 08/03/21  Yes Sanjuan Dame, MD  Cholecalciferol 25 MCG (1000 UT) tablet Take 1,000 Units by mouth daily.   Yes [provider]  diltiazem (DILACOR XR) 180 MG 24 hr capsule Take 1 capsule (180 mg total) by mouth daily. Patient taking differently: Take 180 mg by mouth in the morning. 07/18/21  Yes Charolette Forward, MD  furosemide (LASIX) 40 MG tablet Take 1 tablet (40 mg total) by mouth every Monday, Wednesday, and Friday. 07/07/21  Yes Raulkar,  Clide Deutscher, MD  isosorbide mononitrate (IMDUR) 30 MG 24 hr tablet Take 30 mg by mouth in the morning.   Yes [provider]  metoprolol tartrate (LOPRESSOR) 25 MG tablet Take 0.5 tablets (12.5 mg total) by mouth 2 (two) times daily. 07/18/21  Yes Charolette Forward, MD  pantoprazole (PROTONIX) 40 MG tablet Take 1 tablet (40 mg total) by mouth 2 (two) times daily. Patient taking differently: Take 40 mg by mouth 2 (two) times daily before a meal. 07/06/21  Yes Raulkar, Clide Deutscher, MD  polycarbophil (FIBERCON) 625 MG tablet Take 1 tablet (625 mg total) by mouth daily. 07/23/21  Yes Masters, Katie, DO  polyethylene glycol (MIRALAX / GLYCOLAX) 17 g packet Take 17 g by mouth daily as needed for moderate constipation. 07/18/21  Yes Charolette Forward, MD  pravastatin (PRAVACHOL) 40 MG tablet Take 1 tablet (40 mg total) by mouth daily. 07/13/21  Yes Raulkar, Clide Deutscher, MD  apixaban (ELIQUIS) 2.5 MG TABS tablet Take 2.5 mg by mouth 2 (two) times daily. Patient not taking: Reported on 08/03/2021    [provider]    Scheduled Meds:  multivitamin with minerals  1 tablet Oral Daily   Infusions:  ferric gluconate (FERRLECIT) IVPB     PRN Meds: acetaminophen **OR** acetaminophen, ondansetron **OR** ondansetron (ZOFRAN) IV, polyethylene glycol   Allergies as of 08/02/2021 - Review Complete 07/30/2021  Allergen Reaction Noted   Lisinopril Swelling and Other (See Comments) 12/08/2017    Family History  Problem Relation Age of Onset   Diabetes Mother    Heart Problems Mother     Social History   Socioeconomic History   Marital status: Widowed    Spouse name: Not on file   Number of children: Not on file   Years of education: Not on file   Highest education level: Not on file  Occupational History   Not on file  Tobacco Use   Smoking status: Never   Smokeless tobacco: Never  Vaping Use   Vaping Use: Never used  Substance and Sexual Activity   Alcohol use: No   Drug use: Never   Sexual activity: Not on file  Other Topics Concern   Not on file  Social History Narrative   Patient is widowed   Denies alcohol tobacco use   Lives with adult son   Social Determinants of Health   Financial Resource Strain: Not on file  Food Insecurity: Not on file  Transportation Needs: Not on file  Physical Activity: Not on file  Stress: Not on file  Social Connections: Not on file  Intimate Partner Violence: Not on file    REVIEW OF SYSTEMS: Constitutional: No weakness, no fatigue. ENT:  No nose bleeds Pulm: Shortness of breath or cough CV:  No palpitations, no angina.  Chronic, stable mild lower extremity edema. GU:  No hematuria, no frequency GI: Good appetite.  No dysphagia.  See HPI for other details. Heme:  As per HPI.  As per HPI. Transfusions:  none.  Neuro:  No headaches, no peripheral tingling or numbness.  No dizziness.  No presyncope, no syncope.  No seizures.  Previous left-sided weakness has nearly resolved.  She ambulates with the use of a walker or leaning on furniture. Derm:  No itching, no rash or sores.  Endocrine:  No sweats or chills.  No polyuria or dysuria Immunization: Reviewed. Travel:  Not queried.   PHYSICAL EXAM: Vital signs in last 24 hours: Vitals:   08/03/21 0856 08/03/21 7793  BP: 135/76 (!) 143/77  Pulse: 79 80  Resp: 16 16  Temp: 98.1 F (36.7 C)   SpO2: 100% 100%   Wt Readings from Last 3 Encounters:  07/29/21 74.8 kg  07/22/21 75.2 kg  07/18/21 75.2 kg    General: Patient looks well for her age.  She is alert, comfortable and in no distress Head: No facial asymmetry or swelling.  No signs of head trauma. Eyes: Conjunctiva pink.  EOMI. Ears: No hearing deficit Nose: No discharge or congestion Mouth: Tongue midline.  Mucosa moist, pink, clear. Neck: No JVD, masses, thyromegaly Lungs: Clear bilaterally with good breath sounds.  No labored breathing or cough Heart: RRR with occasional extra beat.  On short interval auscultation, no not obviously A-fib.  There is no telemetry monitor hooked up to review rhythm. Abdomen: Soft without tenderness.  Not distended.  No HSM, masses, bruits, hernias.   Rectal: Visible nonthrombosed external hemorrhoids with no blood, no ulcers.  Scant amount of streaked red blood on exam finger. Musc/Skeltl: No obvious joint deformities. Extremities: Slight, nonpitting pedal edema worse on the left than the right Neurologic: Fully alert and oriented with fluid speech.  2 conversed with her you would not know she has had a stroke.  Moves all 4 limbs without obvious deficit but formal strength testing not pursued.  No tremor. Skin: No suspicious lesions, rashes or sores encountered. Nodes: No cervical adenopathy Psych: Pleasant,  calm, cooperative.  Good insight.  Intake/Output from previous day: No intake/output data recorded. Intake/Output this shift: No intake/output data recorded.  LAB RESULTS: Recent Labs    08/02/21 2130  WBC 9.5  HGB 8.8*  HCT 28.4*  PLT 325   BMET Lab Results  Component Value Date   NA 139 08/02/2021   NA 137 07/31/2021   NA 141 07/30/2021   K 3.7 08/02/2021   K 3.9 07/31/2021   K 3.7 07/30/2021   CL 110 08/02/2021   CL 107 07/31/2021   CL 108 07/30/2021   CO2 20 (L) 08/02/2021   CO2 22 07/31/2021   CO2 23 07/30/2021   GLUCOSE 230 (H) 08/02/2021   GLUCOSE 106 (H) 07/31/2021   GLUCOSE 121 (H) 07/30/2021   BUN 14 08/02/2021   BUN 16 07/31/2021   BUN 18 07/30/2021   CREATININE 1.51 (H) 08/02/2021   CREATININE 1.27 (H) 07/31/2021   CREATININE 1.24 (H) 07/30/2021   CALCIUM 8.9 08/02/2021   CALCIUM 8.5 (L) 07/31/2021   CALCIUM 8.8 (L) 07/30/2021   LFT Recent Labs    08/02/21 2130  PROT 7.1  ALBUMIN 3.4*  AST 17  ALT 9  ALKPHOS 63  BILITOT 0.7   PT/INR Lab Results  Component Value Date   INR 1.5 (H) 07/29/2021   INR 1.2 07/21/2021   INR 1.2 07/16/2021   Hepatitis Panel No results for input(s): "HEPBSAG", "HCVAB", "HEPAIGM", "HEPBIGM" in the last 72 hours. C-Diff No components found for: "CDIFF" Lipase     Component Value Date/Time   LIPASE 46 10/13/2012 1314    Drugs of Abuse     Component Value Date/Time   LABOPIA NONE DETECTED 06/16/2021 1308   COCAINSCRNUR NONE DETECTED 06/16/2021 1308   LABBENZ NONE DETECTED 06/16/2021 1308   AMPHETMU NONE DETECTED 06/16/2021 1308   THCU NONE DETECTED 06/16/2021 1308   LABBARB NONE DETECTED 06/16/2021 1308     RADIOLOGY STUDIES: No results found.    IMPRESSION:   Recurrent rectal bleeding, painless hematochezia.  Just underwent banding of hemorrhoids 4 days  ago.  This could be recurrent hemorrhoidal bleeding or bleeding from the site of ulcer where hemorrhoidal band has fallen out off.    Stable  anemia.  Anemia profile of 6/16 with low iron and 23, low iron sats and borderline low ferritin 21.  Previously took oral iron but has not been taking this recently.  Note orders for Ferrlecit infusion today.  A-fib, history CVA.  Approaching almost 1 month off anticoagulation.  Was to restart Eliquis today but this is now on hold.     GERD, symptomatic on twice daily Protonix.    PLAN:     Observation.  No plans to repeat flexible sigmoidoscopy or banding.  Agree with heart healthy diet and recheck CBC in the morning.  Going forward will be when it is going to be safe to resume Eliquis   Azucena Freed  08/03/2021, 12:09 PM Phone [phone]

## 2021-08-03 NOTE — ED Notes (Signed)
This RN was d/cing pt, and she wanted to go to the bathroom to pee. Pt walked to the bathroom fine, used the bathroom. Upon returning to the room. Family noted blood running down her leg. Upon inspection, rectal bleeding noted and MD notified. Pt denies any dizziness or pain. VS stable.

## 2021-08-03 NOTE — H&P (Cosign Needed)
Date: 08/03/2021               Patient Name:  Patricia English MRN: [id number]  DOB: [birth year] Age / Sex: 88 y.o., female   PCP: [name], MD         Medical Service: Internal Medicine Teaching Service         Attending Physician: Dr. Velna Ochs, MD    First Contact: Dr. Howie Ill Pager: [phone]  Second Contact: Dr. Collene Gobble Pager: [phone]       After Hours (After 5p/  First Contact Pager: [phone]  weekends / holidays): Second Contact Pager: [phone]   Chief Complaint: Rectal bleeding   History of Present Illness:   [name] is a 88 y/o female with a PMHx of recent bleeding hemorrhoids s/p banding (07/30/2021), A. Fib c/b embolic CVA, CKD Stage 3B, HTN, T2DM who presents to the ED with c/o rectal bleeding.   Patricia English was admitted on 6/2 - 6/4 for rectal bleeding at which time differential include hemorrhoidal bleed versus diverticular bleed.  At that time, patient was not considered a candidate for endoscopy or colonoscopy.  She was subsequently readmitted on 6/7 - 6/9 for continued rectal bleeding.  Flex sigmoidoscopy at the time demonstrated diverticulosis with no evidence of bleed and internal and external hemorrhoids, nonthrombosed.  At that time, she was discharged with recommendation to begin daily suppositories.  Unfortunately patient continued to have rectal bleeding, and she was readmitted for the third time from 6/15 - 6/17.  Flex sigmoidoscopy at that time demonstrated internal and external hemorrhoids that were banded.  Since discharge, Patricia English states that she has been feeling overall well other than occasional nausea.  She has been experiencing fatigue and shortness of breath when exerting herself but this has been relatively unchanged.  Yesterday, she had 2 episodes of rectal bleeding that were painless.  She came to the ED for evaluation.  When ambulating in the ED to use the restroom, RN noted rectal bleeding that was dripping down her legs  up to the floor.  She denies any vomiting, abdominal pain, constipation, chest pain, palpitations, dizziness.  Patricia English confirms she has not taken Eliquis since prior to last discharge.  ED Course:  On arrival to the ED, patient's blood pressure was 139/82 with heart rate of 60 and respiratory rate of 15.  She was saturating at 95% on room air.  Initial blood work demonstrates stable hemoglobin at 8.8.  Renal function within baseline with creatinine of 1.5.  Due to recurrence of rectal bleed, IMTS was consulted for admission  Meds:  No current facility-administered medications on file prior to encounter.   Current Outpatient Medications on File Prior to Encounter  Medication Sig Dispense Refill   apixaban (ELIQUIS) 5 MG TABS tablet Take 1 tablet (5 mg total) by mouth 2 (two) times daily. 60 tablet 0   Cholecalciferol 25 MCG (1000 UT) tablet Take 1,000 Units by mouth daily.     diltiazem (DILACOR XR) 180 MG 24 hr capsule Take 1 capsule (180 mg total) by mouth daily. (Patient taking differently: Take 180 mg by mouth in the morning.) 60 capsule 0   furosemide (LASIX) 40 MG tablet Take 1 tablet (40 mg total) by mouth every Monday, Wednesday, and Friday. 30 tablet 3   isosorbide mononitrate (IMDUR) 30 MG 24 hr tablet Take 30 mg by mouth in the morning.     metoprolol tartrate (LOPRESSOR) 25 MG tablet Take 0.5 tablets (12.5 mg  total) by mouth 2 (two) times daily. 90 tablet 3   pantoprazole (PROTONIX) 40 MG tablet Take 1 tablet (40 mg total) by mouth 2 (two) times daily. (Patient taking differently: Take 40 mg by mouth 2 (two) times daily before a meal.) 180 tablet 3   polycarbophil (FIBERCON) 625 MG tablet Take 1 tablet (625 mg total) by mouth daily. 30 tablet 0   polyethylene glycol (MIRALAX / GLYCOLAX) 17 g packet Take 17 g by mouth daily as needed for moderate constipation. 14 each 0   pravastatin (PRAVACHOL) 40 MG tablet Take 1 tablet (40 mg total) by mouth daily. 90 tablet 3   apixaban  (ELIQUIS) 2.5 MG TABS tablet Take 2.5 mg by mouth 2 (two) times daily. (Patient not taking: Reported on 08/03/2021)     Allergies: Allergies as of 08/02/2021 - Review Complete 07/30/2021  Allergen Reaction Noted   Lisinopril Swelling and Other (See Comments) 12/08/2017   Past Medical History:  Diagnosis Date   Coronary artery disease    Diabetes mellitus without complication ([place])    GI bleeding 11/2017   Hypertension    Internal hemorrhoids    Lesion of pancreas 07/31/2021   MR - suspect serous cystadenoma   Family History:  Family History  Problem Relation Age of Onset   Diabetes Mother    Heart Problems Mother    Social History:  Patient lives in [place] with her son. Since her stroke, she has been independent with ADL's, depending on IADL's. No history of smoking, alcohol use, or other drug use.  Review of Systems: A complete ROS was negative except as per HPI.   Physical Exam: Blood pressure (!) 149/58, pulse 80, temperature 98.1 F (36.7 C), temperature source Oral, resp. rate 16, SpO2 100 %.  Physical Exam Vitals and nursing note reviewed. Exam conducted with a chaperone present.  Constitutional:      General: She is not in acute distress.    Appearance: She is normal weight.  HENT:     Head: Normocephalic and atraumatic.     Mouth/Throat:     Mouth: Mucous membranes are moist.     Pharynx: Oropharynx is clear.  Eyes:     Conjunctiva/sclera: Conjunctivae normal.     Pupils: Pupils are equal, round, and reactive to light.  Cardiovascular:     Rate and Rhythm: Normal rate. Rhythm irregularly irregular.     Heart sounds: No murmur heard. Pulmonary:     Effort: Pulmonary effort is normal. No respiratory distress.     Breath sounds: Normal breath sounds. No wheezing, rhonchi or rales.  Abdominal:     General: Bowel sounds are normal. There is no distension.     Palpations: Abdomen is soft.     Tenderness: There is no abdominal tenderness. There is no  guarding.  Genitourinary:    Comments:  Rectal Examination:  - Multiple external hemorrhoids, non-tender to palpation with no evidence of bleeding. 3 mm round nodule present on the left inferior aspect. - Multiple internal hemorrhoids palpated with tenderness. Bright red blood present.  Musculoskeletal:     Right lower leg: No edema.     Left lower leg: No edema.  Skin:    General: Skin is warm and dry.  Neurological:     General: No focal deficit present.     Mental Status: She is alert and oriented to person, place, and time. Mental status is at baseline.  Psychiatric:        Mood and Affect: Mood  normal.        Behavior: Behavior normal.        Thought Content: Thought content normal.        Judgment: Judgment normal.    Assessment & Plan by Problem: Principal Problem:   Rectal bleeding  [name] is a 88 y/o female with a PMHx of recent bleeding hemorrhoids s/p banding (07/30/2021), A. Fib c/b embolic CVA, CKD Stage 3B, HTN, T2DM who is currently admitted for recurrent rectal bleeding.  # Rectal Bleeding # Recurrent Hemorrhoidal Bleeding s/p banding (07/30/2021)  Patient presents with 1 day history of bright red blood per rectum, with bright red blood present on DRE.  Suspected etiology is recurrent hemorrhoidal bleeding.  Lower on the differential includes diverticular bleed.  She has recently undergone banding of both internal and external hemorrhoids and has been holding her home anticoagulant.    Given this is her fourth admission for rectal bleeding, will consult GI for consideration of hemorrhoidectomy.  I am concerned that continued conservative management will be unsuccessful and will prevent the ability to restart her home anticoagulation.  Hemoglobin stable at this time  - GI consulted; appreciate their recommendations - Repeat CBC pending - Trend hemoglobin while admitted - Transfuse for hemoglobin less than 7 or hemodynamic instability  # Iron Deficiency  Anemia  Likely in the setting of recurrent GI bleed.  Hemoglobin on admission relatively unchanged compared to prior.  Iron panel obtained on prior admission demonstrated iron saturation of 7% with ferritin of 21.  Due to this and prior history of difficulty tolerating p.o. iron supplementation, will administer IV iron today.  - IV ferric gluconate ordered  # Permanent A. Fib  - Continue home diltiazem, and metoprolol - Holding home Eliquis in the setting of rectal bleeding  # HTN  Blood pressure at goal at this time.  - Resume home antihypertensives including diltiazem, metoprolol and Imdur  # T2DM A1c on most recent admission within goal at 6.3%  - SSI  # CKD Stage 3B Creatinine fluctuates between 1.1 and 1.5 over the last 1 year. Appears euvolemic on examination.   - Trend creatinine while admitted  # CAD  - Continue home pravastatin  Diet: Heart Healthy/CM VTE: SCDs IVF: None,None Code: Full  Prior to Admission Living Arrangement: Home, living with family Anticipated Discharge Location: Home Barriers to Discharge: GI evaluation and serial hemoglobin monitoring.   Dispo: Admit patient to Observation with expected length of stay less than 2 midnights.  Signed: Dr. Jose Persia Internal Medicine PGY-3 Pager: [phone] After 5pm on weekdays and 1pm on weekends: On Call pager [phone]  08/03/2021, 2:08 PM

## 2021-08-03 NOTE — Discharge Instructions (Addendum)
Your hemorrhoids were evaluated in the ED and were not actively bleeding.  There status post banding.  Your hemoglobin is improved compared to measurements at discharge.  Please follow-up with the internal medicine clinic for repeat evaluation.  Please also follow-up with gastroenterology. Hold your Eliquis for an additional day given your recent bleeding and restart on Thursday.

## 2021-08-03 NOTE — ED Provider Notes (Addendum)
[facility] EMERGENCY DEPARTMENT Provider Note   CSN: [id number] Arrival date & time: 08/02/21  2048     History  Chief Complaint  Patient presents with   Rectal Bleeding    Patricia English is a 88 y.o. female.  HPI  88 year old female with medical history significant for prior CVA on Eliquis, CAD, DM 2, HTN, internal and external hemorrhoids status post banding who presents emergency department with rectal bleeding.  The patient was admitted to the hospital for bleeding hemorrhoids and underwent banding with GI for rubber band ligation.  She was discharged from the hospital on 6/17 and advised to hold her Eliquis until 6/20.  Yesterday, 6/19, she had 2 episodes of bright red blood per rectum while straining to have a bowel movement.  She has had no further episodes since.  She denies any abdominal pain, lightheadedness, shortness of breath, syncope. Bleeding amount was described to be a cupful in volume by the patient.  Home Medications Prior to Admission medications   Medication Sig Start Date End Date Taking? Authorizing Provider  apixaban (ELIQUIS) 5 MG TABS tablet Take 1 tablet (5 mg total) by mouth 2 (two) times daily. 08/03/21  Yes Sanjuan Dame, MD  Cholecalciferol 25 MCG (1000 UT) tablet Take 1,000 Units by mouth daily.   Yes [provider]  diltiazem (DILACOR XR) 180 MG 24 hr capsule Take 1 capsule (180 mg total) by mouth daily. Patient taking differently: Take 180 mg by mouth in the morning. 07/18/21  Yes Charolette Forward, MD  furosemide (LASIX) 40 MG tablet Take 1 tablet (40 mg total) by mouth every Monday, Wednesday, and Friday. 07/07/21  Yes Raulkar, Clide Deutscher, MD  isosorbide mononitrate (IMDUR) 30 MG 24 hr tablet Take 30 mg by mouth in the morning.   Yes [provider]  metoprolol tartrate (LOPRESSOR) 25 MG tablet Take 0.5 tablets (12.5 mg total) by mouth 2 (two) times daily. 07/18/21  Yes Charolette Forward, MD  pantoprazole (PROTONIX) 40  MG tablet Take 1 tablet (40 mg total) by mouth 2 (two) times daily. Patient taking differently: Take 40 mg by mouth 2 (two) times daily before a meal. 07/06/21  Yes Raulkar, Clide Deutscher, MD  polycarbophil (FIBERCON) 625 MG tablet Take 1 tablet (625 mg total) by mouth daily. 07/23/21  Yes Masters, Katie, DO  polyethylene glycol (MIRALAX / GLYCOLAX) 17 g packet Take 17 g by mouth daily as needed for moderate constipation. 07/18/21  Yes Charolette Forward, MD  pravastatin (PRAVACHOL) 40 MG tablet Take 1 tablet (40 mg total) by mouth daily. 07/13/21  Yes Raulkar, Clide Deutscher, MD  apixaban (ELIQUIS) 2.5 MG TABS tablet Take 2.5 mg by mouth 2 (two) times daily. Patient not taking: Reported on 08/03/2021    [provider]      Allergies    Lisinopril    Review of Systems   Review of Systems  Gastrointestinal:  Positive for anal bleeding.  All other systems reviewed and are negative.   Physical Exam Updated Vital Signs BP (!) 143/77 (BP Location: Right Arm)   Pulse 80   Temp 98.1 F (36.7 C) (Oral)   Resp 16   SpO2 100%  Physical Exam Vitals and nursing note reviewed. Exam conducted with a chaperone present.  Constitutional:      General: She is not in acute distress. HENT:     Head: Normocephalic and atraumatic.  Eyes:     Conjunctiva/sclera: Conjunctivae normal.     Pupils: Pupils are equal, round,  and reactive to light.  Cardiovascular:     Rate and Rhythm: Normal rate and regular rhythm.  Pulmonary:     Effort: Pulmonary effort is normal. No respiratory distress.  Abdominal:     General: There is no distension.     Tenderness: There is no guarding.  Genitourinary:    Comments: External hemorrhoids present, status post rubber band ligation, no hematochezia or melena Musculoskeletal:        General: No deformity or signs of injury.     Cervical back: Neck supple.  Skin:    Findings: No lesion or rash.  Neurological:     General: No focal deficit present.     Mental Status: She  is alert. Mental status is at baseline.     ED Results / Procedures / Treatments   Labs (all labs ordered are listed, but only abnormal results are displayed) Labs Reviewed  CBC WITH DIFFERENTIAL/PLATELET - Abnormal; Notable for the following components:      Result Value   RBC 3.04 (*)    Hemoglobin 8.8 (*)    HCT 28.4 (*)    All other components within normal limits  COMPREHENSIVE METABOLIC PANEL - Abnormal; Notable for the following components:   CO2 20 (*)    Glucose, Bld 230 (*)    Creatinine, Ser 1.51 (*)    Albumin 3.4 (*)    GFR, Estimated 32 (*)    All other components within normal limits  CBC WITH DIFFERENTIAL/PLATELET  TYPE AND SCREEN    EKG None  Radiology No results found.  Procedures Procedures    Medications Ordered in ED Medications  ferric gluconate (FERRLECIT) 250 mg in sodium chloride 0.9 % 250 mL IVPB (has no administration in time range)  acetaminophen (TYLENOL) tablet 650 mg (has no administration in time range)    Or  acetaminophen (TYLENOL) suppository 650 mg (has no administration in time range)  polyethylene glycol (MIRALAX / GLYCOLAX) packet 17 g (has no administration in time range)  ondansetron (ZOFRAN) tablet 4 mg (has no administration in time range)    Or  ondansetron (ZOFRAN) injection 4 mg (has no administration in time range)  multivitamin with minerals tablet 1 tablet (has no administration in time range)  diltiazem (DILACOR XR) 24 hr capsule 180 mg (has no administration in time range)  furosemide (LASIX) tablet 40 mg (has no administration in time range)  isosorbide mononitrate (IMDUR) 24 hr tablet 30 mg (has no administration in time range)  metoprolol tartrate (LOPRESSOR) tablet 12.5 mg (has no administration in time range)  pantoprazole (PROTONIX) EC tablet 40 mg (has no administration in time range)  polycarbophil (FIBERCON) tablet 625 mg (has no administration in time range)  pravastatin (PRAVACHOL) tablet 40 mg (has no  administration in time range)  insulin aspart (novoLOG) injection 0-9 Units (has no administration in time range)    ED Course/ Medical Decision Making/ A&P                           Medical Decision Making Amount and/or Complexity of Data Reviewed Labs: ordered.  Risk Decision regarding hospitalization.   88 year old female with medical history significant for prior CVA on Eliquis, CAD, DM 2, HTN, internal and external hemorrhoids status post banding who presents emergency department with rectal bleeding.  The patient was admitted to the hospital for bleeding hemorrhoids and underwent banding with GI for rubber band ligation.  She was discharged from the hospital on  6/17 and advised to hold her Eliquis until 6/20.  Yesterday, 6/19, she had 2 episodes of bright red blood per rectum while straining to have a bowel movement.  She has had no further episodes since.  She denies any abdominal pain, lightheadedness, shortness of breath, syncope. Bleeding amount was described to be a cupful in volume by the patient.  On arrival, the patient was hemodynamically stable.  Physical exam significant for external hemorrhoids present status post rubber band ligation.  No active bleeding noted on exam.  Patient had 2 episodes yesterday.  Hemoglobin checked on CBC entry is appears improved from discharge 3 days ago at 8.8.  Patient is otherwise vitally stable.  With no further episodes of bleeding, low concern for active GI bleed requiring inpatient admission or hospitalization at this time.  The patient has scheduled follow-up with GI and internal medicine that is been arranged at discharge from the hospital.  I advised that the patient hold her Eliquis for 2 additional days and restart on Thursday 6/22. Stable for follow-up outpatient with return precautions discussed with the patient a family bedside.  Unfortunately, prior to discharge, the patient went to the restroom and had recurrent active rectal bleeding.  Given the ongoing moderate volume bleeding, age of the patient, need for further trending of the patient's hemoglobin, internal medicine was consulted for admission for observation.   Final Clinical Impression(s) / ED Diagnoses Final diagnoses:  Bleeding hemorrhoids    Rx / DC Orders ED Discharge Orders     None         Regan Lemming, MD 08/03/21 2202    Regan Lemming, MD 08/03/21 1244

## 2021-08-04 ENCOUNTER — Telehealth: Payer: Self-pay

## 2021-08-04 DIAGNOSIS — Z888 Allergy status to other drugs, medicaments and biological substances status: Secondary | ICD-10-CM | POA: Diagnosis not present

## 2021-08-04 DIAGNOSIS — N179 Acute kidney failure, unspecified: Secondary | ICD-10-CM | POA: Diagnosis present

## 2021-08-04 DIAGNOSIS — K5900 Constipation, unspecified: Secondary | ICD-10-CM | POA: Diagnosis present

## 2021-08-04 DIAGNOSIS — I4821 Permanent atrial fibrillation: Secondary | ICD-10-CM | POA: Diagnosis present

## 2021-08-04 DIAGNOSIS — D500 Iron deficiency anemia secondary to blood loss (chronic): Secondary | ICD-10-CM | POA: Diagnosis present

## 2021-08-04 DIAGNOSIS — Z79899 Other long term (current) drug therapy: Secondary | ICD-10-CM | POA: Diagnosis not present

## 2021-08-04 DIAGNOSIS — E1122 Type 2 diabetes mellitus with diabetic chronic kidney disease: Secondary | ICD-10-CM | POA: Diagnosis present

## 2021-08-04 DIAGNOSIS — N1832 Chronic kidney disease, stage 3b: Secondary | ICD-10-CM | POA: Diagnosis present

## 2021-08-04 DIAGNOSIS — K625 Hemorrhage of anus and rectum: Secondary | ICD-10-CM

## 2021-08-04 DIAGNOSIS — Z8673 Personal history of transient ischemic attack (TIA), and cerebral infarction without residual deficits: Secondary | ICD-10-CM | POA: Diagnosis not present

## 2021-08-04 DIAGNOSIS — K219 Gastro-esophageal reflux disease without esophagitis: Secondary | ICD-10-CM | POA: Diagnosis present

## 2021-08-04 DIAGNOSIS — Z833 Family history of diabetes mellitus: Secondary | ICD-10-CM | POA: Diagnosis not present

## 2021-08-04 DIAGNOSIS — K648 Other hemorrhoids: Secondary | ICD-10-CM | POA: Diagnosis present

## 2021-08-04 DIAGNOSIS — I2510 Atherosclerotic heart disease of native coronary artery without angina pectoris: Secondary | ICD-10-CM | POA: Diagnosis present

## 2021-08-04 DIAGNOSIS — K649 Unspecified hemorrhoids: Secondary | ICD-10-CM | POA: Diagnosis present

## 2021-08-04 DIAGNOSIS — L89151 Pressure ulcer of sacral region, stage 1: Secondary | ICD-10-CM | POA: Diagnosis present

## 2021-08-04 DIAGNOSIS — I129 Hypertensive chronic kidney disease with stage 1 through stage 4 chronic kidney disease, or unspecified chronic kidney disease: Secondary | ICD-10-CM | POA: Diagnosis present

## 2021-08-04 DIAGNOSIS — K644 Residual hemorrhoidal skin tags: Secondary | ICD-10-CM | POA: Diagnosis present

## 2021-08-04 LAB — CBC WITH DIFFERENTIAL/PLATELET
Abs Immature Granulocytes: 0.04 10*3/uL (ref 0.00–0.07)
Basophils Absolute: 0.1 10*3/uL (ref 0.0–0.1)
Basophils Relative: 1 %
Eosinophils Absolute: 0.3 10*3/uL (ref 0.0–0.5)
Eosinophils Relative: 3 %
HCT: 26.4 % — ABNORMAL LOW (ref 36.0–46.0)
Hemoglobin: 8.4 g/dL — ABNORMAL LOW (ref 12.0–15.0)
Immature Granulocytes: 0 %
Lymphocytes Relative: 22 %
Lymphs Abs: 2 10*3/uL (ref 0.7–4.0)
MCH: 29.2 pg (ref 26.0–34.0)
MCHC: 31.8 g/dL (ref 30.0–36.0)
MCV: 91.7 fL (ref 80.0–100.0)
Monocytes Absolute: 0.7 10*3/uL (ref 0.1–1.0)
Monocytes Relative: 8 %
Neutro Abs: 5.9 10*3/uL (ref 1.7–7.7)
Neutrophils Relative %: 66 %
Platelets: 269 10*3/uL (ref 150–400)
RBC: 2.88 MIL/uL — ABNORMAL LOW (ref 3.87–5.11)
RDW: 13.3 % (ref 11.5–15.5)
WBC: 9.1 10*3/uL (ref 4.0–10.5)
nRBC: 0 % (ref 0.0–0.2)

## 2021-08-04 LAB — BASIC METABOLIC PANEL
Anion gap: 6 (ref 5–15)
BUN: 13 mg/dL (ref 8–23)
CO2: 22 mmol/L (ref 22–32)
Calcium: 8.7 mg/dL — ABNORMAL LOW (ref 8.9–10.3)
Chloride: 112 mmol/L — ABNORMAL HIGH (ref 98–111)
Creatinine, Ser: 1.13 mg/dL — ABNORMAL HIGH (ref 0.44–1.00)
GFR, Estimated: 46 mL/min — ABNORMAL LOW (ref >60)
Glucose, Bld: 118 mg/dL — ABNORMAL HIGH (ref 70–99)
Potassium: 4 mmol/L (ref 3.5–5.1)
Sodium: 140 mmol/L (ref 135–145)

## 2021-08-04 LAB — GLUCOSE, CAPILLARY
Glucose-Capillary: 148 mg/dL — ABNORMAL HIGH (ref 70–99)
Glucose-Capillary: 103 mg/dL — ABNORMAL HIGH (ref 70–99)
Glucose-Capillary: 220 mg/dL — ABNORMAL HIGH (ref 70–99)
Glucose-Capillary: 85 mg/dL (ref 70–99)

## 2021-08-04 MED ORDER — POLYETHYLENE GLYCOL 3350 17 G PO PACK
17.0000 g | PACK | Freq: Two times a day (BID) | ORAL | Status: DC
Start: 2021-08-04 — End: 2021-08-05
  Administered 2021-08-04 – 2021-08-05 (×3): 17 g via ORAL
  Filled 2021-08-04 (×3): qty 1

## 2021-08-04 MED ORDER — SODIUM CHLORIDE 0.9 % IV SOLN
250.0000 mg | Freq: Once | INTRAVENOUS | Status: AC
  Administered 2021-08-04: 250 mg via INTRAVENOUS
  Filled 2021-08-04: qty 20

## 2021-08-04 MED ORDER — APIXABAN 2.5 MG PO TABS
2.5000 mg | ORAL_TABLET | Freq: Two times a day (BID) | ORAL | Status: DC
Start: 2021-08-04 — End: 2021-08-05
  Administered 2021-08-04 – 2021-08-05 (×2): 2.5 mg via ORAL
  Filled 2021-08-04 (×2): qty 1

## 2021-08-04 NOTE — Progress Notes (Addendum)
    Gastroenterology Inpatient Follow Up    Subjective: Has had had any additional rectal bleeding since yesterday. Has not had a BM since this past Sunday.  Objective: Vital signs in last 24 hours: Temp:  [97.4 F (36.3 C)-98.3 F (36.8 C)] 98.2 F (36.8 C) (06/21 0807) Pulse Rate:  [78-94] 81 (06/21 0807) Resp:  [14-18] 17 (06/21 0807) BP: (105-149)/(56-80) 123/58 (06/21 0807) SpO2:  [97 %-100 %] 100 % (06/21 0807) Last BM Date : 08/01/21  Intake/Output from previous day: No intake/output data recorded. Intake/Output this shift: Total I/O In: 240 [P.O.:240] Out: -   General appearance: alert and cooperative Resp: no increased WOB Cardio: regular rate GI: soft, non-tender; bowel sounds normal; no masses,  no organomegaly Extremities: extremities normal, atraumatic, no cyanosis or edema  Lab Results: Recent Labs    08/02/21 2130 08/03/21 1350 08/04/21 0139  WBC 9.5 11.0* 9.1  HGB 8.8* 9.9* 8.4*  HCT 28.4* 31.3* 26.4*  PLT 325 319 269   BMET Recent Labs    08/02/21 2130 08/04/21 0139  NA 139 140  K 3.7 4.0  CL 110 112*  CO2 20* 22  GLUCOSE 230* 118*  BUN 14 13  CREATININE 1.51* 1.13*  CALCIUM 8.9 8.7*   LFT Recent Labs    08/02/21 2130  PROT 7.1  ALBUMIN 3.4*  AST 17  ALT 9  ALKPHOS 63  BILITOT 0.7   PT/INR No results for input(s): "LABPROT", "INR" in the last 72 hours. Hepatitis Panel No results for input(s): "HEPBSAG", "HCVAB", "HEPAIGM", "HEPBIGM" in the last 72 hours. C-Diff No results for input(s): "CDIFFTOX" in the last 72 hours.  Studies/Results: No results found.  Medications: I have reviewed the patient's current medications. Scheduled:  diltiazem  180 mg Oral Daily   furosemide  40 mg Oral Q M,W,F   insulin aspart  0-9 Units Subcutaneous TID WC   isosorbide mononitrate  30 mg Oral q AM   metoprolol tartrate  12.5 mg Oral BID   multivitamin with minerals  1 tablet Oral Daily   pantoprazole  40 mg Oral BID AC    polycarbophil  625 mg Oral Daily   polyethylene glycol  17 g Oral BID   pravastatin  40 mg Oral QHS   Continuous: PIR:[id number] **OR** acetaminophen, ondansetron **OR** ondansetron (ZOFRAN) IV  Assessment/Plan: 88 year old female with history of A-fib and CVA on Eliquis (currently being held), CAD, DM, GERD presents with rectal bleeding. Had hemorrhoidal banding on 07/30/21. At this time would suspect that the patient is having rectal bleeding from hemorrhoids or a post-banding ulcer. Patient has not demonstrated a drop in her Hb and her bleeding has now resolved.  I think would be reasonable to restart her Eliquis today to see if the patient develops any recurrent rectal bleeding.  May be worthwhile to reach out to cardiology to see if the lower dose of Eliquis 2.5 mg twice daily may be adequate for stroke prevention in the setting of her recurrent rectal bleeding.  I discussed this with the patient, patient's daughter, and the primary hospital team.  Patient is also suffering from some constipation so we will start her on some MiraLAX twice daily scheduled.  Constipation should be avoided if possible because this will worsen hemorrhoidal bleeding.    LOS: 0 days   Sharyn Creamer 08/04/2021, 11:20 AM

## 2021-08-04 NOTE — Plan of Care (Signed)

## 2021-08-04 NOTE — Progress Notes (Addendum)
HD#0 Subjective:  Overnight Events: none  Patient assessed at bedside this morning. She states that she has not had additional episodes of bleeding since getting to the hospital. She does not have abdominal pain.   Pt is updated on the plan for today, and all questions and concerns are addressed.   Objective:  Vital signs in last 24 hours: Vitals:   08/03/21 2201 08/03/21 2230 08/04/21 0021 08/04/21 0415  BP: 132/75 105/63 (!) 107/56 (!) 116/56  Pulse: 93 78 78 83  Resp:  '18 18 17  '$ Temp: 97.8 F (36.6 C) (!) 97.4 F (36.3 C) 98.3 F (36.8 C) 97.9 F (36.6 C)  TempSrc: Oral Oral Oral Oral  SpO2: 97% 100% 99% 98%   Supplemental O2: Room Air SpO2: 98 %   Physical Exam:  Constitutional: well-appearing, sitting up in bed, in no acute distress Cardiovascular: regular rate and rhythm, no m/r/g Pulmonary/Chest: normal work of breathing on room air, lungs clear to auscultation bilaterally Abdominal: soft, non-tender, non-distended, bowel sounds in all 4 quadrants GI: external hemorrhoids present, no frank blood, stage 1 pressure ulcer present on sacrum Neurological:  Skin: warm and dry Psych: normal mood and affect  There were no vitals filed for this visit.  No intake or output data in the 24 hours ending 08/04/21 0702 Net IO Since Admission: No IO data has been entered for this period [08/04/21 0702]  Pertinent Labs:    Latest Ref Rng & Units 08/04/2021    1:39 AM 08/03/2021    1:50 PM 08/02/2021    9:30 PM  CBC  WBC 4.0 - 10.5 K/uL 9.1  11.0  9.5   Hemoglobin 12.0 - 15.0 g/dL 8.4  9.9  8.8   Hematocrit 36.0 - 46.0 % 26.4  31.3  28.4   Platelets 150 - 400 K/uL 269  319  325        Latest Ref Rng & Units 08/04/2021    1:39 AM 08/02/2021    9:30 PM 07/31/2021   12:42 AM  CMP  Glucose 70 - 99 mg/dL 118  230  106   BUN 8 - 23 mg/dL '13  14  16   '$ Creatinine 0.44 - 1.00 mg/dL 1.13  1.51  1.27   Sodium 135 - 145 mmol/L 140  139  137   Potassium 3.5 - 5.1 mmol/L 4.0   3.7  3.9   Chloride 98 - 111 mmol/L 112  110  107   CO2 22 - 32 mmol/L '22  20  22   '$ Calcium 8.9 - 10.3 mg/dL 8.7  8.9  8.5   Total Protein 6.5 - 8.1 g/dL  7.1    Total Bilirubin 0.3 - 1.2 mg/dL  0.7    Alkaline Phos 38 - 126 U/L  63    AST 15 - 41 U/L  17    ALT 0 - 44 U/L  9      Imaging: No results found.  Assessment/Plan:   Principal Problem:   Rectal bleeding   Patient Summary: Patricia English is a 88 y.o. with a pertinent PMH of recent bleeding hemorrhoids s/p banding (07/30/2021), A. Fib c/b embolic CVA, CKD Stage 3B, HTN, T2DM, who presented with rectal bleeding and admitted on 6/20 for rectal bleeding on HD#0. No further episodes of bleeding. GI is not able to offer additional procedures. She is medically stable for discharge.  Rectal Bleeding Recurrent Hemorrhoidal Bleeding s/p banding (07/30/2021)  Patient was evaluated by GI with plans to  monitor hemoglobin and possible repeat flex sig if drop is seen. Hgb from 9.9 to 8.4, GI does not planning for flex sig and do not have additional procedures to offer her. This is her fourth admission for rectal bleeding. She continued to have bleeding following banding and could benefit from hemorrhoidectomy. Not currently receiving suppositories following banding last week. If interested in hemorrhoidectomy she will need to follow up as outpatient. -GI following -Trend CBC -transfuse for Hgb less than 8 or hemodynamic instability -miralax BID  Iron Deficiency Anemia  In the setting of recurrent GI bleeds. Ganzoni equation with 1100 iron deficiency. She is receiving iron. -IV ferric gluconate, dose 2  Permanent A. Fib  Recent embolic CVA Will restart apixaban at 2.5 mg dosing. She does not meet criteria for lower dosing but with recurrent admissions for bleeds thinking that some dosing is better than none.  -Continue home diltiazem and metoprolol -Restart apixaban 2.5 mg qd  HTN -continue home antihypertensives including  diltiazem, metoprolol and Imdur  T2DM HgbA1c at 6.3 at recent admission -SSI  CKD stage 3B Creatinine stable from 1.5 to 1.13.  CAD Continue home pravastatin  Diet: Carb-Modified VTE: SCDs Code: Full Family Update: Called and updated daughter   Dispo: Anticipated discharge to Home tomorrow.  Jaramiah Bossard M. Locklyn Henriquez, D.O.  Internal Medicine Resident, PGY-1 [facility]  Pager: [phone] 7:02 AM, 08/04/2021   **Please contact the on call pager after 5 pm and on weekends at [phone].**

## 2021-08-04 NOTE — Progress Notes (Signed)
Mobility Specialist Progress Note   08/04/21 1040  Mobility  Activity Ambulated with assistance in hallway  Level of Assistance Contact guard assist, steadying assist  Assistive Device Front wheel walker  Distance Ambulated (ft) 130 ft  Activity Response Tolerated well  $Mobility charge 1 Mobility   Received pt in bed having no complaints and agreeable to mobility. Pt was asymptomatic throughout ambulation and returned to room w/o fault. Left back in w/ call bell in reach and all needs met.  Holland Falling Mobility Specialist Phone Number [phone]

## 2021-08-04 NOTE — Telephone Encounter (Signed)
Request for 90 day supply of Dilt OR 180 mg denied. Send to PCP

## 2021-08-04 NOTE — Care Management Obs Status (Signed)
[facility] NOTIFICATION   Patient Details  Name: Patricia English MRN: [id number] Date of Birth: Aug 15, 1927   Medicare Observation Status Notification Given:  Yes    Ella Bodo, RN 08/04/2021, 12:41 PM

## 2021-08-05 DIAGNOSIS — K625 Hemorrhage of anus and rectum: Secondary | ICD-10-CM | POA: Diagnosis not present

## 2021-08-05 LAB — CBC
HCT: 27.4 % — ABNORMAL LOW (ref 36.0–46.0)
Hemoglobin: 8.7 g/dL — ABNORMAL LOW (ref 12.0–15.0)
MCH: 28.8 pg (ref 26.0–34.0)
MCHC: 31.8 g/dL (ref 30.0–36.0)
MCV: 90.7 fL (ref 80.0–100.0)
Platelets: 297 10*3/uL (ref 150–400)
RBC: 3.02 MIL/uL — ABNORMAL LOW (ref 3.87–5.11)
RDW: 13.2 % (ref 11.5–15.5)
WBC: 9 10*3/uL (ref 4.0–10.5)
nRBC: 0 % (ref 0.0–0.2)

## 2021-08-05 LAB — GLUCOSE, CAPILLARY
Glucose-Capillary: 207 mg/dL — ABNORMAL HIGH (ref 70–99)
Glucose-Capillary: 99 mg/dL (ref 70–99)

## 2021-08-05 MED ORDER — APIXABAN 2.5 MG PO TABS: 2.5000 mg | ORAL_TABLET | Freq: Two times a day (BID) | ORAL | 0 refills | Status: AC

## 2021-08-05 MED ORDER — POLYETHYLENE GLYCOL 3350 17 G PO PACK: 17.0000 g | PACK | Freq: Two times a day (BID) | ORAL | 0 refills | Status: DC

## 2021-08-05 MED ORDER — ADULT MULTIVITAMIN W/MINERALS CH: 1.0000 | ORAL_TABLET | Freq: Every day | ORAL | 0 refills | Status: AC

## 2021-08-05 MED ORDER — SODIUM CHLORIDE 0.9 % IV SOLN
250.0000 mg | Freq: Once | INTRAVENOUS | Status: AC
  Administered 2021-08-05: 250 mg via INTRAVENOUS
  Filled 2021-08-05: qty 20

## 2021-08-05 NOTE — Hospital Course (Addendum)
Recurrent hemorrhoidal bleed s/p banding (07/30/21) Diverticular bleed Ms. Heidemann was admitted on 6/2 - 6/4 for rectal bleeding at which time differential include hemorrhoidal bleed versus diverticular bleed.  Patient was not considered a candidate for endoscopy or colonoscopy.  She was subsequently readmitted on 6/7 - 6/9 for continued rectal bleeding.  Flex sigmoidoscopy demonstrated diverticulosis with no evidence of bleed and internal and external hemorrhoids, nonthrombosed.  She was discharged with recommendation to begin daily suppositories.  Unfortunately patient continued to have rectal bleeding, and she was readmitted for the third time from 6/15 - 6/17.  Flex sigmoidoscopy demonstrated internal and external hemorrhoids that were banded. She presented on 6/20 because of 2 episodes of rectal bleeding that were painless, likely secondary to hemorrhoids. Hgb was stable and GI recommended miralax to prevent constipation. No additional procedures available, she can follow-up with surgery as outpatient to talk about hemorrhoidectomy.  Iron deficiency anemia Likely in the setting of recurrent GI bleeds. She received 3 doses of IV ferric gluconate. She has had difficulty tolerating PO supplementation 2/2 to constipation in the past.  Permanent A. Fib  Recent embolic CVA Patient with history of embolic stroke in May. She has a long standing history of atrial fibrillation and was previously on warfarin. Eliquis was started following stroke in May then she has been admitted 4 times for rectal bleeding. Hemoglobin remained stable and eliquis at 2.5 mg dosing was started. I have sent a message to her cardiology/ PCP.  HTN Home medications include diltiazem, metoprolol, and imdur  T2DM A1c on most recent admission within goal at 6.3%.  CKD stage 3b Creatinine fluctuates between 1.1 and 1.5 over the last 1 year.  CAD Continue home pravastatin.

## 2021-08-05 NOTE — Plan of Care (Signed)
  Problem: Education: Goal: Ability to describe self-care measures that may prevent or decrease complications (Diabetes Survival Skills Education) will improve Outcome: Progressing Goal: Individualized Educational Video(s) Outcome: Progressing   Problem: Coping: Goal: Ability to adjust to condition or change in health will improve Outcome: Progressing   Problem: Fluid Volume: Goal: Ability to maintain a balanced intake and output will improve Outcome: Progressing   Problem: Health Behavior/Discharge Planning: Goal: Ability to identify and utilize available resources and services will improve Outcome: Progressing Goal: Ability to manage health-related needs will improve Outcome: Progressing   Problem: Nutritional: Goal: Maintenance of adequate nutrition will improve Outcome: Progressing Goal: Progress toward achieving an optimal weight will improve Outcome: Progressing   Problem: Skin Integrity: Goal: Risk for impaired skin integrity will decrease Outcome: Progressing   Problem: Tissue Perfusion: Goal: Adequacy of tissue perfusion will improve Outcome: Progressing   Problem: Coping: Goal: Ability to adjust to condition or change in health will improve Outcome: Progressing   Problem: Fluid Volume: Goal: Ability to maintain a balanced intake and output will improve Outcome: Progressing   Problem: Health Behavior/Discharge Planning: Goal: Ability to identify and utilize available resources and services will improve Outcome: Progressing Goal: Ability to manage health-related needs will improve Outcome: Progressing   Problem: Nutritional: Goal: Maintenance of adequate nutrition will improve Outcome: Progressing Goal: Progress toward achieving an optimal weight will improve Outcome: Progressing   Problem: Skin Integrity: Goal: Risk for impaired skin integrity will decrease Outcome: Progressing   Problem: Tissue Perfusion: Goal: Adequacy of tissue perfusion will  improve Outcome: Progressing   Problem: Clinical Measurements: Goal: Ability to maintain clinical measurements within normal limits will improve Outcome: Progressing Goal: Will remain free from infection Outcome: Progressing Goal: Diagnostic test results will improve Outcome: Progressing Goal: Respiratory complications will improve Outcome: Progressing Goal: Cardiovascular complication will be avoided Outcome: Progressing   Problem: Elimination: Goal: Will not experience complications related to bowel motility Outcome: Progressing Goal: Will not experience complications related to urinary retention Outcome: Progressing   Problem: Skin Integrity: Goal: Risk for impaired skin integrity will decrease Outcome: Progressing

## 2021-08-05 NOTE — Consult Note (Signed)
    Progress Note   Subjective  Chief Complaint: Rectal bleeding  Still no bowel movement since Sunday, patient was given MiraLAX this morning.  Tells me that her abdomen feels very full.    Objective   Vital signs in last 24 hours: Temp:  [97.7 F (36.5 C)-98.4 F (36.9 C)] 98.4 F (36.9 C) (06/22 0807) Pulse Rate:  [68-78] 75 (06/22 0807) Resp:  [16-22] 16 (06/22 0807) BP: (108-138)/(54-68) 119/63 (06/22 0807) SpO2:  [99 %-100 %] 100 % (06/22 0807) Last BM Date : 08/01/21 General:    AA female in NAD Heart:  Regular rate and rhythm; no murmurs Lungs: Respirations even and unlabored, lungs CTA bilaterally Abdomen:  Soft, nontender and mild distension. Normal bowel sounds. Psych:  Cooperative. Normal mood and affect.  Intake/Output from previous day: 06/21 0701 - 06/22 0700 In: 240 [P.O.:240] Out: 2200 [Urine:2200]   Lab Results: Recent Labs    08/03/21 1350 08/04/21 0139 08/05/21 0129  WBC 11.0* 9.1 9.0  HGB 9.9* 8.4* 8.7*  HCT 31.3* 26.4* 27.4*  PLT 319 269 297   BMET Recent Labs    08/02/21 2130 08/04/21 0139  NA 139 140  K 3.7 4.0  CL 110 112*  CO2 20* 22  GLUCOSE 230* 118*  BUN 14 13  CREATININE 1.51* 1.13*  CALCIUM 8.9 8.7*   LFT Recent Labs    08/02/21 2130  PROT 7.1  ALBUMIN 3.4*  AST 17  ALT 9  ALKPHOS 63  BILITOT 0.7     Assessment / Plan:   Assessment: 1.  Rectal bleeding: Status post hemorrhoid banding 07/30/2021, likely bleeding is from hemorrhoids or post banding ulcer, hemoglobin has remained stable and actually increased overnight 2.  A-fib and CVA on Eliquis: Eliquis restarted 08/04/2021  Plan: 1.  Patient received 1 dose of MiraLAX so far and is still not had a bowel movement, this is already scheduled out twice daily and hopefully in the next 24 to 48 hours she will have one. 2.  Again may have discussion with cardiology in regards to decreasing Eliquis to 2.5 mg twice daily to see if this is adequate for stroke prevention  in the setting of her recurrent rectal bleeding  We will sign off.  Please let us know if you have any further concerns.    LOS: 1 day   Levin Erp  08/05/2021, 12:51 PM

## 2021-08-05 NOTE — Progress Notes (Signed)
Patricia English to be D/C'd  per MD order.  Discussed with the patient and all questions fully answered.  VSS, Skin clean, dry and intact without evidence of skin break down, no evidence of skin tears noted.  IV catheter discontinued intact. Site without signs and symptoms of complications. Dressing and pressure applied.  An After Visit Summary was printed and given to the patient.  D/c re-educated completed with patient/family including follow up instructions, medication list, d/c activities limitations if indicated, with other d/c instructions as indicated by MD - patient able to verbalize understanding, all questions fully answered.   Patient instructed to return to ED, call 911, or call MD for any changes in condition.   Patient to be escorted via [place], and D/C home via private auto.

## 2021-08-09 NOTE — Progress Notes (Signed)
[facility] [address] [place]. [place] [address] [phone]       HOSPITAL FOLLOW UP NOTE  [name] Date of Birth:  [birth year] Medical Record Number:  [id number]   Reason for Referral:  hospital stroke follow up    SUBJECTIVE:   CHIEF COMPLAINT:  Chief Complaint  Patient presents with   Follow-up    Rm 3  with daughter here for hospital follow up. Reports she has been doing ok working with physical and occupational therapy at present. Vissit hav    HPI:   [name] is a 88 y.o. female with history of CAD, DM, HTN, atrial fibrillation (not on anticoagulation) and GI bleed who presented on 06/16/2021 with left-sided weakness that occurred upon awakening and was unable to get out of bed. Of note, fall on 4/30 hitting left forehead.  Personally reviewed hospitalization pertinent progress notes, lab work and imaging.  Evaluated by Dr. Roda Shutters for right MCA infarct involving basal ganglia, insula and parietal lobe likely secondary to A-fib not on AC.  MRA torturous right MCA and negative LVO.  Carotid Doppler unremarkable.  LDL 79.  A1c 6.3.  No antithrombotic PTA and not on AC due to GI bleed 10/2020.  After discussion with family and cardiology, Eliquis initiated for secondary stroke prevention measures.  Increase home dose pravastatin from 40 mg to 80 mg daily.  Residual deficits of mild to moderate dysarthria and left-sided weakness with gait impairment.  Therapy eval's recommended CIR for ongoing therapy needs and discharged on 5/8.    Today, 08/09/2021, patient is being seen for initial hospital stroke follow-up accompanied by her daughter.  Overall stable from stroke standpoint.  Reports residual mild left sided weakness but improving since discharge. She also notes LLE swelling.  Currently working with home health therapies. Use of RW, was not using prior, has not had any recent falls. Lives in own home, son lives with her, family does assist with  cooking and cleaning, patient does own medications.  She was completely independent prior to stroke.  Denies any new stroke/TIA symptoms.   She has had 4 hospitalizations since CIR discharge for recurrent GI bleeding since discharge likely secondary to hemorrhoids s/p banding 6/16.  His hemoglobin remained stable, recommended restarting Eliquis 2.5 mg twice daily as well as close GI and cardiology follow-up. Does have GI f/u 8/3. Does have f/u with PCP/cardiologist this afternoon.   She has remained on Eliquis without any additional bleeding concerns over the past week.  Compliant on pravastatin without side effects.  Initially discharge on pravastatin 80 mg daily (2x 40mg  tab) but after d/c, insurance would only cover 1 tab daily.  Blood pressure today 118/58. Does not routinely monitor at home.   No further concerns at this time      PERTINENT IMAGING  Per hospitalization 06/16/2021 CT head No acute abnormality. Atrophy.  MRI  acute infarcts in right basal ganglia, right insula and right parietal lobe MRA  I believe there is artifactual signal loss in the right MCA bifurcation region because the vessel turns caudally. I do not think there is an M1 occlusion. More distal branch vessels show flow, which would not be the case if there were an M1 occlusion. There does appear to be a missing M2 branch, probably within the inferior division. Carotid Doppler unremarkable 2D Echo EF 55 to 60% LDL 79 HgbA1c 6.3    ROS:   14 system review of systems performed and negative with exception of  those listed in HPI  PMH:  Past Medical History:  Diagnosis Date   Coronary artery disease    Diabetes mellitus without complication (HCC)    GI bleeding 11/2017   Hypertension    Internal hemorrhoids    Lesion of pancreas 07/31/2021   MR - suspect serous cystadenoma    PSH:  Past Surgical History:  Procedure Laterality Date   BIOPSY  10/30/2020   Procedure: BIOPSY;  Surgeon: Patricia Burn, MD;   Location: [facility] ENDOSCOPY;  Service: Gastroenterology;;   BIOPSY  10/31/2020   Procedure: BIOPSY;  Surgeon: Patricia Burn, MD;  Location: [facility] ENDOSCOPY;  Service: Gastroenterology;;   CATARACT EXTRACTION     COLONOSCOPY WITH PROPOFOL N/A 10/31/2020   Procedure: COLONOSCOPY WITH PROPOFOL;  Surgeon: Patricia Burn, MD;  Location: [facility] ENDOSCOPY;  Service: Gastroenterology;  Laterality: N/A;   ESOPHAGOGASTRODUODENOSCOPY (EGD) WITH PROPOFOL N/A 08/13/2017   Procedure: ESOPHAGOGASTRODUODENOSCOPY (EGD) WITH PROPOFOL;  Surgeon: Patricia Boop, MD;  Location[facility] ENDOSCOPY;  Service: Endoscopy;  Laterality: N/A;   ESOPHAGOGASTRODUODENOSCOPY (EGD) WITH PROPOFOL N/A 10/30/2020   Procedure: ESOPHAGOGASTRODUODENOSCOPY (EGD) WITH PROPOFOL;  Surgeon: Patricia Burn, MD;  Location: [facility] ENDOSCOPY;  Service: Gastroenterology;  Laterality: N/A;   FLEXIBLE SIGMOIDOSCOPY N/A 12/10/2017   Procedure: FLEXIBLE SIGMOIDOSCOPY;  Surgeon: Patricia Boop, MD;  Location[facility]COPY;  Service: Endoscopy;  Laterality: N/A;   FLEXIBLE SIGMOIDOSCOPY N/A 07/22/2021   Procedure: FLEXIBLE SIGMOIDOSCOPY;  Surgeon: Patricia Score Netty Starring., MD;  Location: [facility] ENDOSCOPY;  Service: Gastroenterology;  Laterality: N/A;   FLEXIBLE SIGMOIDOSCOPY N/A 07/30/2021   Procedure: FLEXIBLE SIGMOIDOSCOPY;  Surgeon: Patricia Boop, MD;  Location: [facility] ENDOSCOPY;  Service: Gastroenterology;  Laterality: N/A;   HEMORRHOID BANDING  12/10/2017   Procedure: HEMORRHOID BANDING;  Surgeon: Patricia Boop, MD;  Location: [facility] ENDOSCOPY;  Service: Endoscopy;;   HEMORRHOID BANDING  07/30/2021   Procedure: Patricia English;  Surgeon: Patricia Boop, MD;  Location: [facility] ENDOSCOPY;  Service: Gastroenterology;;   HEMOSTASIS CLIP PLACEMENT  10/31/2020   Procedure: HEMOSTASIS CLIP PLACEMENT;  Surgeon: Patricia Burn, MD;  Location: [facility] ENDOSCOPY;  Service: Gastroenterology;;   POLYPECTOMY  10/31/2020   Procedure: POLYPECTOMY;  Surgeon: Patricia Burn, MD;  Location: [facility] ENDOSCOPY;   Service: Gastroenterology;;    Social History:  Social History   Socioeconomic History   Marital status: Widowed    Spouse name: Not on file   Number of children: Not on file   Years of education: Not on file   Highest education level: Not on file  Occupational History   Not on file  Tobacco Use   Smoking status: Never   Smokeless tobacco: Never  Vaping Use   Vaping Use: Never used  Substance and Sexual Activity   Alcohol use: No   Drug use: Never   Sexual activity: Not on file  Other Topics Concern   Not on file  Social History Narrative   Patient is widowed   Denies alcohol tobacco use   Lives with adult son   Social Determinants of Health   Financial Resource Strain: Not on file  Food Insecurity: Not on file  Transportation Needs: Not on file  Physical Activity: Not on file  Stress: Not on file  Social Connections: Not on file  Intimate Partner Violence: Not on file    Family History:  Family History  Problem Relation Age of Onset   Diabetes Mother    Heart Problems Mother     Medications:   Current Outpatient  Medications on File Prior to Visit  Medication Sig Dispense Refill   apixaban (ELIQUIS) 2.5 MG TABS tablet Take 1 tablet (2.5 mg total) by mouth 2 (two) times daily. 60 tablet 0   Cholecalciferol 25 MCG (1000 UT) tablet Take 1,000 Units by mouth daily.     diltiazem (DILACOR XR) 180 MG 24 hr capsule Take 1 capsule (180 mg total) by mouth daily. (Patient taking differently: Take 180 mg by mouth in the morning.) 60 capsule 0   furosemide (LASIX) 40 MG tablet Take 1 tablet (40 mg total) by mouth every Monday, Wednesday, and Friday. 30 tablet 3   isosorbide mononitrate (IMDUR) 30 MG 24 hr tablet Take 30 mg by mouth in the morning.     metoprolol tartrate (LOPRESSOR) 25 MG tablet Take 0.5 tablets (12.5 mg total) by mouth 2 (two) times daily. 90 tablet 3   Multiple Vitamin (MULTIVITAMIN WITH MINERALS) TABS tablet Take 1 tablet by mouth daily. 30 tablet 0    pantoprazole (PROTONIX) 40 MG tablet Take 1 tablet (40 mg total) by mouth 2 (two) times daily. (Patient taking differently: Take 40 mg by mouth 2 (two) times daily before a meal.) 180 tablet 3   polycarbophil (FIBERCON) 625 MG tablet Take 1 tablet (625 mg total) by mouth daily. 30 tablet 0   polyethylene glycol (MIRALAX / GLYCOLAX) 17 g packet Take 17 g by mouth 2 (two) times daily. 14 each 0   pravastatin (PRAVACHOL) 40 MG tablet Take 1 tablet (40 mg total) by mouth daily. 90 tablet 3   No current facility-administered medications on file prior to visit.    Allergies:   Allergies  Allergen Reactions   Lisinopril Swelling and Other (See Comments)    This caused laryngeal edema      OBJECTIVE:  Physical Exam  Vitals:   08/10/21 0951  BP: (!) 118/58  Pulse: 77  Weight: 168 lb (76.2 kg)  Height: 5\' 9"  (1.753 m)   Body mass index is 24.81 kg/m. No results found.  Post stroke [facility] 2/9    07/06/2021   11:11 AM  Depression screen PHQ 2/9  Decreased Interest 0  Down, Depressed, Hopeless 0  PHQ - 2 Score 0  Altered sleeping 0  Tired, decreased energy 0  Change in appetite 0  Feeling bad or failure about yourself  0  Trouble concentrating 0  Moving slowly or fidgety/restless 0  Suicidal thoughts 0  PHQ-9 Score 0  Difficult doing work/chores Not difficult at all     General: well developed, well nourished, very pleasant elderly African-American female, seated, in no evident distress Head: head normocephalic and atraumatic.   Neck: supple with no carotid or supraclavicular bruits Cardiovascular: regular rate and rhythm, no murmurs Musculoskeletal: no deformity Skin:  no rash/petichiae; 2+ pitting edema LLE, no significant edema RLE Vascular:  Normal pulses all extremities   Neurologic Exam Mental Status: Awake and fully alert.  Fluent speech and language.  Oriented to place and time. Recent and remote memory intact. Attention span, concentration and fund of knowledge  appropriate. Mood and affect appropriate.  Cranial Nerves: Fundoscopic exam reveals sharp disc margins. Pupils equal, briskly reactive to light. Extraocular movements full without nystagmus. Visual fields full to confrontation. Hearing intact. Facial sensation intact.  Mild left lower facial weakness.  Tongue, palate moves normally and symmetrically.  Motor: Normal bulk and tone. Normal strength in all tested extremity muscles except mild left deltoid, hand and HF weakness Sensory.: intact to touch , pinprick ,  position and vibratory sensation.  Coordination: Rapid alternating movements normal in all extremities slightly decreased left hand. Finger-to-nose and heel-to-shin performed accurately bilaterally.  Orbits right arm over left arm. Gait and Station: Arises from chair with mild difficulty. Stance is slightly hunched. Gait demonstrates slow cautious decreased stride length and LLE step height with use of rolling walker.  Tandem walking heel toe not attempted. Reflexes: 1+ and symmetric. Toes downgoing.     NIHSS  1 Modified Rankin  2-3      ASSESSMENT: Jeffery Bruff is a 88 y.o. year old female with right MCA infarct involving basal ganglia, insula and parietal lobe on 06/16/2021 likely secondary to A-fib not on [facility]. Vascular risk factors include A-fib not on AC d/t GI bleed in 10/2020, CAD, HTN, HLD, DM and advanced age.  Has had recurrent hospitalizations since discharge for GI bleeds which were felt to be hemorrhoidal - has f/u with GI in August      PLAN:  Right MCA strokes:  Residual deficit: Mild left hemiparesis and gait impairment.  Continue working with therapies for hopeful ongoing recovery.  Use of RW at all times unless otherwise instructed. LLE swelling likely post stroke as no evidence of skin color changes, warmth or tenderness. Did discuss obtaining LE ultrasound to ensure no DVT although lower suspicion and is currently on anticoagulation - she plans on further discussing  with PCP at todays visit. Did discuss warning signs/red flag symptoms to monitor for which patient and daughter verbalized understanding Continue  Eliquis 2.5 mg twice daily  (lower dose d/t recurrent GI bleed) and pravastatin 40 mg daily for secondary stroke prevention.  Medications managed by PCP/cardiologist Discussed secondary stroke prevention measures and importance of close PCP follow up for aggressive stroke risk factor management including BP goal<130/90, HLD with LDL goal<70 and DM with A1c.<7.  Stroke labs LDL 79, A1c 6.3 I have gone over the pathophysiology of stroke, warning signs and symptoms, risk factors and their management in some detail with instructions to go to the closest emergency room for symptoms of concern.     Follow up in 4 months or call earlier if needed   CC:  [facility] provider: Dr. Pearlean Brownie PCP: Orpah Cobb, MD    I spent 59 minutes of face-to-face and non-face-to-face time with patient and daughter.  This included previsit chart review including review of recent hospitalization, lab review, study review, order entry, electronic health record documentation, patient and daughter education and discussion regarding recent stroke including etiology, secondary stroke prevention measures and importance of managing stroke risk factors, residual deficits and typical recovery time, recurrent hospitalizations for GI bleeds and answered all other questions to patient and daughters satisfaction   Ihor Austin, AGNP-BC  [facility] [address] [place], [place] [address]  Phone [phone] Fax [phone] Note: This document was prepared with digital dictation and possible smart phrase technology. Any transcriptional errors that result from this process are unintentional.

## 2021-08-10 ENCOUNTER — Encounter: Payer: Self-pay | Admitting: Adult Health

## 2021-08-10 ENCOUNTER — Ambulatory Visit (INDEPENDENT_AMBULATORY_CARE_PROVIDER_SITE_OTHER): Payer: Medicare Other | Admitting: Adult Health

## 2021-08-10 VITALS — BP 118/58 | HR 77 | Ht 69.0 in | Wt 168.0 lb

## 2021-08-10 DIAGNOSIS — G8194 Hemiplegia, unspecified affecting left nondominant side: Secondary | ICD-10-CM | POA: Diagnosis not present

## 2021-08-10 DIAGNOSIS — I63411 Cerebral infarction due to embolism of right middle cerebral artery: Secondary | ICD-10-CM

## 2021-08-10 DIAGNOSIS — I480 Paroxysmal atrial fibrillation: Secondary | ICD-10-CM

## 2021-08-10 DIAGNOSIS — Z09 Encounter for follow-up examination after completed treatment for conditions other than malignant neoplasm: Secondary | ICD-10-CM

## 2021-08-11 ENCOUNTER — Telehealth: Payer: Self-pay | Admitting: Student

## 2021-08-11 NOTE — Telephone Encounter (Signed)
-----   Message from Sanjuan Dame, MD sent at 07/31/2021 12:46 PM EDT ----- Regarding: Appointment Hello, [name] is being discharged today, 6/17. I need to make a hospital follow-up appointment for her. Is there a way she could be seen by Thursday? Also, the daughter (also named [name]) requests that we call to schedule this on Monday afternoon.   Thank you!! Doren Custard

## 2021-08-11 NOTE — Telephone Encounter (Signed)
Please refer to message below.  Unable to contact patient's daughter.  Left detailed message to call our office back.

## 2021-08-17 NOTE — Progress Notes (Signed)
I agree with the above plan 

## 2021-08-20 ENCOUNTER — Other Ambulatory Visit: Payer: Self-pay | Admitting: Internal Medicine

## 2021-09-16 ENCOUNTER — Encounter: Payer: Self-pay | Admitting: Internal Medicine

## 2021-09-16 ENCOUNTER — Ambulatory Visit (INDEPENDENT_AMBULATORY_CARE_PROVIDER_SITE_OTHER): Payer: Medicare Other | Admitting: Internal Medicine

## 2021-09-16 VITALS — BP 114/62 | HR 62 | Ht 69.0 in | Wt 166.0 lb

## 2021-09-16 DIAGNOSIS — K644 Residual hemorrhoidal skin tags: Secondary | ICD-10-CM

## 2021-09-16 DIAGNOSIS — D136 Benign neoplasm of pancreas: Secondary | ICD-10-CM

## 2021-09-16 DIAGNOSIS — I63511 Cerebral infarction due to unspecified occlusion or stenosis of right middle cerebral artery: Secondary | ICD-10-CM

## 2021-09-16 DIAGNOSIS — Z7901 Long term (current) use of anticoagulants: Secondary | ICD-10-CM

## 2021-09-16 DIAGNOSIS — K648 Other hemorrhoids: Secondary | ICD-10-CM

## 2021-09-16 NOTE — Progress Notes (Signed)
Patricia English 88 y.o. [birth year] [id number]  Assessment & Plan:   Encounter Diagnoses  Name Primary?   Internal and external bleeding hemorrhoids Yes   Serous cystadenoma of pancreas per MRI    Long term current use of anticoagulant     Hemorrhoids are improved but are persistent issue.  I think they will be no matter what.  Given the overall situation I am inclined to observe at this point.  I think it is unlikely that I could completely treat these grade 3 internal hemorrhoids with banding.  Depending upon clinical course we may need to try that.  She seems to be doing much better with the lower dose of Eliquis.  Pancreatic lesion thought to be serous cystadenoma does not need follow-up given its benign nature plus she is 89.  Surgery would not be an option.  Should she develop symptoms we could reconsider repeat imaging.   Follow-up as needed  CC: Patricia Dials, MD   Subjective:   Chief Complaint: Bleeding hemorrhoids pancreas lesion HPI 88 year old African-American woman here with her daughter, known to me with prior diarrhea issues and hemorrhoids, and was in the hospital with a new diagnosis of stroke treated with Eliquis due to A-fib, in June.  At that point she had bleeding from internal and external hemorrhoids.  Given the severity of the issue I banded 3 columns of her internal hemorrhoids (grade 3).  She improved then she had more bleeding and came back to the hospital, her Eliquis dose was lowered from 5 mg to 2.5 mg twice daily.  She has some occasional bleeding mainly it leaks into her depends at night.  Hemorrhoids persistently protrude.  She denies any significant diarrhea or constipation issues.  She is on iron therapy and has had labs followed up through Dr. Doylene Canard and it is reported they were "good".   Flexible sigmoidoscopy 07/30/2021 - Hemorrhoids found on perianal exam. - External and internal hemorrhoids. Banded internals x 3 - The examination was  otherwise normal. - No specimens collected.  MRI abdomen MRCP with and without contrast 07/22/2021 IMPRESSION: 1. Multiseptated microcystic lesion of the pancreatic neck measuring 2.6 x 2.5 cm, with atrophy of the pancreatic parenchyma and dilatation of the pancreatic duct distally. Minimal associated septal enhancement without nodular solid component. Findings are consistent with serous cystadenoma. 2. Cholelithiasis. 3. Colonic diverticulosis.     Ambulating with a cane and progressing after stroke, having PT.     Latest Ref Rng & Units 08/05/2021    1:29 AM 08/04/2021    1:39 AM 08/03/2021    1:50 PM  CBC  WBC 4.0 - 10.5 K/uL 9.0  9.1  11.0   Hemoglobin 12.0 - 15.0 g/dL 8.7  8.4  9.9   Hematocrit 36.0 - 46.0 % 27.4  26.4  31.3   Platelets 150 - 400 K/uL 297  269  319     Allergies  Allergen Reactions   Lisinopril Swelling and Other (See Comments)    This caused laryngeal edema   Current Meds  Medication Sig   apixaban (ELIQUIS) 2.5 MG TABS tablet Take 1 tablet (2.5 mg total) by mouth 2 (two) times daily.   Cholecalciferol 25 MCG (1000 UT) tablet Take 1,000 Units by mouth daily.   diltiazem (DILACOR XR) 180 MG 24 hr capsule Take 1 capsule (180 mg total) by mouth daily. (Patient taking differently: Take 180 mg by mouth in the morning.)   furosemide (LASIX) 40 MG tablet Take 1 tablet (40 mg  total) by mouth every Monday, Wednesday, and Friday.   isosorbide mononitrate (IMDUR) 30 MG 24 hr tablet Take 30 mg by mouth in the morning.   metoprolol tartrate (LOPRESSOR) 25 MG tablet Take 0.5 tablets (12.5 mg total) by mouth 2 (two) times daily.   Multiple Vitamin (MULTIVITAMIN WITH MINERALS) TABS tablet Take 1 tablet by mouth daily.   pantoprazole (PROTONIX) 40 MG tablet Take 1 tablet (40 mg total) by mouth 2 (two) times daily. (Patient taking differently: Take 40 mg by mouth 2 (two) times daily before a meal.)   polycarbophil (FIBERCON) 625 MG tablet Take 1 tablet (625 mg total) by  mouth daily.   polyethylene glycol (MIRALAX / GLYCOLAX) 17 g packet Take 17 g by mouth 2 (two) times daily.   pravastatin (PRAVACHOL) 40 MG tablet Take 1 tablet (40 mg total) by mouth daily.   Past Medical History:  Diagnosis Date   Coronary artery disease    CVA (cerebral vascular accident) ([place])    Diabetes mellitus without complication ([place])    GI bleeding 11/2017   Hypertension    Internal hemorrhoids    Lesion of pancreas 07/31/2021   MR - suspect serous cystadenoma   Paroxysmal atrial fibrillation [facility]    Past Surgical History:  Procedure Laterality Date   BIOPSY  10/30/2020   Procedure: BIOPSY;  Surgeon: Sharyn Creamer, MD;  Location: [place];  Service: Gastroenterology;;   BIOPSY  10/31/2020   Procedure: BIOPSY;  Surgeon: Sharyn Creamer, MD;  Location: [facility] ENDOSCOPY;  Service: Gastroenterology;;   CATARACT EXTRACTION     COLONOSCOPY WITH PROPOFOL N/A 10/31/2020   Procedure: COLONOSCOPY WITH PROPOFOL;  Surgeon: Sharyn Creamer, MD;  Location: [facility] ENDOSCOPY;  Service: Gastroenterology;  Laterality: N/A;   ESOPHAGOGASTRODUODENOSCOPY (EGD) WITH PROPOFOL N/A 08/13/2017   Procedure: ESOPHAGOGASTRODUODENOSCOPY (EGD) WITH PROPOFOL;  Surgeon: [name], MD;  Location: [place];  Service: Endoscopy;  Laterality: N/A;   ESOPHAGOGASTRODUODENOSCOPY (EGD) WITH PROPOFOL N/A 10/30/2020   Procedure: ESOPHAGOGASTRODUODENOSCOPY (EGD) WITH PROPOFOL;  Surgeon: Sharyn Creamer, MD;  Location: [place];  Service: Gastroenterology;  Laterality: N/A;   FLEXIBLE SIGMOIDOSCOPY N/A 12/10/2017   Procedure: FLEXIBLE SIGMOIDOSCOPY;  Surgeon: [name], MD;  Location[facility] ENDOSCOPY;  Service: Endoscopy;  Laterality: N/A;   FLEXIBLE SIGMOIDOSCOPY N/A 07/22/2021   Procedure: FLEXIBLE SIGMOIDOSCOPY;  Surgeon: Rush Landmark Telford Nab., MD;  Location: [place];  Service: Gastroenterology;  Laterality: N/A;   FLEXIBLE SIGMOIDOSCOPY N/A 07/30/2021   Procedure: FLEXIBLE SIGMOIDOSCOPY;  Surgeon:  [name], MD;  Location: [place];  Service: Gastroenterology;  Laterality: N/A;   HEMORRHOID BANDING  12/10/2017   Procedure: HEMORRHOID BANDING;  Surgeon: [name], MD;  Location: [place];  Service: Endoscopy;;   HEMORRHOID BANDING  07/30/2021   Procedure: Thayer Jew;  Surgeon: [name], MD;  Location: [place];  Service: Gastroenterology;;   HEMOSTASIS CLIP PLACEMENT  10/31/2020   Procedure: HEMOSTASIS CLIP PLACEMENT;  Surgeon: Sharyn Creamer, MD;  Location: [place];  Service: Gastroenterology;;   POLYPECTOMY  10/31/2020   Procedure: POLYPECTOMY;  Surgeon: Sharyn Creamer, MD;  Location: [facility] ENDOSCOPY;  Service: Gastroenterology;;   Social History   Social History Narrative   Patient is widowed   Denies alcohol tobacco use   Lives with adult son   family history includes Diabetes in her mother; Heart Problems in her mother.   Review of Systems As per HPI  Objective:   Physical Exam BP 114/62   Pulse 62  Ht '5\' 9"'$  (1.753 m)   Wt 166 lb (75.3 kg)   SpO2 98%   BMI 24.51 kg/m  Elderly bw NAD Patti Martinique, CMA present.  Digital rectal exam shows the hemorrhoids that are seen as below.  Mixed internal and external hemorrhoids, she has prolapsed grade 3.  No other abnormalities on digital exam    Data reviewed include hospital records, neurology visit in late June and what is in the HPI.

## 2021-09-16 NOTE — Patient Instructions (Signed)
The hemorrhoids are still there as we know - as long as the bleeding stays infrequent would not band again.  If you get worsening bleeding let me know or go to hospital if really bad.  The pancreas lesion is benign and does not need follow-up.  I appreciate the opportunity to care for you. [name], MD, [name]

## 2021-10-04 ENCOUNTER — Other Ambulatory Visit: Payer: Self-pay | Admitting: Internal Medicine

## 2021-10-27 ENCOUNTER — Other Ambulatory Visit: Payer: Self-pay | Admitting: Internal Medicine

## 2021-12-08 ENCOUNTER — Other Ambulatory Visit: Payer: Self-pay | Admitting: Internal Medicine

## 2021-12-08 ENCOUNTER — Other Ambulatory Visit: Payer: Self-pay | Admitting: Student

## 2021-12-15 NOTE — Progress Notes (Unsigned)
[facility] [address] [place]. [place] [address] [phone]       STROKE FOLLOW UP NOTE  [name] Date of Birth:  May 02, 1927 Medical Record Number:  [id number]   Reason for Referral:  stroke follow up    SUBJECTIVE:   CHIEF COMPLAINT:  No chief complaint on file.   HPI:    Update 12/16/2021 JM: Patient returns for 62-monthfollow-up visit.  Overall stable without new stroke/TIA symptoms.  Reports residual ***.  Ambulates with ***, denies any falls.  Continues on Eliquis and pravastatin.  Blood pressure well controlled.     History provided for reference purposes only Initial visit 08/09/2021 JM: Patient is being seen for initial hospital stroke follow-up accompanied by her daughter.  Overall stable from stroke standpoint.  Reports residual mild left sided weakness but improving since discharge. She also notes LLE swelling.  Currently working with home health therapies. Use of RW, was not using prior, has not had any recent falls. Lives in own home, son lives with her, family does assist with cooking and cleaning, patient does own medications.  She was completely independent prior to stroke.  Denies any new stroke/TIA symptoms.   She has had 4 hospitalizations since CIR discharge for recurrent GI bleeding since discharge likely secondary to hemorrhoids s/p banding 6/16.  His hemoglobin remained stable, recommended restarting Eliquis 2.5 mg twice daily as well as close GI and cardiology follow-up. Does have GI f/u 8/3. Does have f/u with PCP/cardiologist this afternoon.   She has remained on Eliquis without any additional bleeding concerns over the past week.  Compliant on pravastatin without side effects.  Initially discharge on pravastatin 80 mg daily (2x '40mg'$  tab) but after d/c, insurance would only cover 1 tab daily.  Blood pressure today 118/58. Does not routinely monitor at home.   No further concerns at this time   Stroke admission  06/16/2021 [name] a 88y.o. female with history of CAD, DM, HTN, atrial fibrillation (not on anticoagulation) and GI bleed who presented on 06/16/2021 with left-sided weakness that occurred upon awakening and was unable to get out of bed. Of note, fall on 4/30 hitting left forehead.  Personally reviewed hospitalization pertinent progress notes, lab work and imaging.  Evaluated by Dr. XErlinda Hongfor right MCA infarct involving basal ganglia, insula and parietal lobe likely secondary to A-fib not on AC.  MRA torturous right MCA and negative LVO.  Carotid Doppler unremarkable.  LDL 79.  A1c 6.3.  No antithrombotic PTA and not on AC due to GI bleed 10/2020.  After discussion with family and cardiology, Eliquis initiated for secondary stroke prevention measures.  Increase home dose pravastatin from 40 mg to 80 mg daily.  Residual deficits of mild to moderate dysarthria and left-sided weakness with gait impairment.  Therapy eval's recommended CIR for ongoing therapy needs and discharged on 5/8.      PERTINENT IMAGING  Per hospitalization 06/16/2021 CT head No acute abnormality. Atrophy.  MRI  acute infarcts in right basal ganglia, right insula and right parietal lobe MRA  I believe there is artifactual signal loss in the right MCA bifurcation region because the vessel turns caudally. I do not think there is an M1 occlusion. More distal branch vessels show flow, which would not be the case if there were an M1 occlusion. There does appear to be a missing M2 branch, probably within the inferior division. Carotid Doppler unremarkable 2D Echo EF 55 to 60% LDL 79 HgbA1c  6.3    ROS:   14 system review of systems performed and negative with exception of those listed in HPI  PMH:  Past Medical History:  Diagnosis Date   Coronary artery disease    CVA (cerebral vascular accident) ([place])    Diabetes mellitus without complication ([place])    GI bleeding 11/2017   Hypertension    Internal hemorrhoids     Lesion of pancreas 07/31/2021   MR - suspect serous cystadenoma   Paroxysmal atrial fibrillation (HCC)     PSH:  Past Surgical History:  Procedure Laterality Date   BIOPSY  10/30/2020   Procedure: BIOPSY;  Surgeon: Sharyn Creamer, MD;  Location: [place];  Service: Gastroenterology;;   BIOPSY  10/31/2020   Procedure: BIOPSY;  Surgeon: Sharyn Creamer, MD;  Location: [place];  Service: Gastroenterology;;   CATARACT EXTRACTION     COLONOSCOPY WITH PROPOFOL N/A 10/31/2020   Procedure: COLONOSCOPY WITH PROPOFOL;  Surgeon: Sharyn Creamer, MD;  Location: [place];  Service: Gastroenterology;  Laterality: N/A;   ESOPHAGOGASTRODUODENOSCOPY (EGD) WITH PROPOFOL N/A 08/13/2017   Procedure: ESOPHAGOGASTRODUODENOSCOPY (EGD) WITH PROPOFOL;  Surgeon: [name], MD;  Location: [place];  Service: Endoscopy;  Laterality: N/A;   ESOPHAGOGASTRODUODENOSCOPY (EGD) WITH PROPOFOL N/A 10/30/2020   Procedure: ESOPHAGOGASTRODUODENOSCOPY (EGD) WITH PROPOFOL;  Surgeon: Sharyn Creamer, MD;  Location: [place];  Service: Gastroenterology;  Laterality: N/A;   FLEXIBLE SIGMOIDOSCOPY N/A 12/10/2017   Procedure: FLEXIBLE SIGMOIDOSCOPY;  Surgeon: [name], MD;  Location: [facility] ENDOSCOPY;  Service: Endoscopy;  Laterality: N/A;   FLEXIBLE SIGMOIDOSCOPY N/A 07/22/2021   Procedure: FLEXIBLE SIGMOIDOSCOPY;  Surgeon: Rush Landmark Telford Nab., MD;  Location: [place];  Service: Gastroenterology;  Laterality: N/A;   FLEXIBLE SIGMOIDOSCOPY N/A 07/30/2021   Procedure: FLEXIBLE SIGMOIDOSCOPY;  Surgeon: [name], MD;  Location: [place];  Service: Gastroenterology;  Laterality: N/A;   HEMORRHOID BANDING  12/10/2017   Procedure: HEMORRHOID BANDING;  Surgeon: [name], MD;  Location: [place];  Service: Endoscopy;;   HEMORRHOID BANDING  07/30/2021   Procedure: Thayer Jew;  Surgeon: [name], MD;  Location: [place];  Service: Gastroenterology;;   HEMOSTASIS CLIP PLACEMENT   10/31/2020   Procedure: HEMOSTASIS CLIP PLACEMENT;  Surgeon: Sharyn Creamer, MD;  Location: [place];  Service: Gastroenterology;;   POLYPECTOMY  10/31/2020   Procedure: POLYPECTOMY;  Surgeon: Sharyn Creamer, MD;  Location: [facility] ENDOSCOPY;  Service: Gastroenterology;;    Social History:  Social History   Socioeconomic History   Marital status: Widowed    Spouse name: Not on file   Number of children: Not on file   Years of education: Not on file   Highest education level: Not on file  Occupational History   Not on file  Tobacco Use   Smoking status: Never   Smokeless tobacco: Never  Vaping Use   Vaping Use: Never used  Substance and Sexual Activity   Alcohol use: No   Drug use: Never   Sexual activity: Not on file  Other Topics Concern   Not on file  Social History Narrative   Patient is widowed   Denies alcohol tobacco use   Lives with adult son   Social Determinants of Health   Financial Resource Strain: Not on file  Food Insecurity: Not on file  Transportation Needs: Not on file  Physical Activity: Not on file  Stress: Not on file  Social Connections: Not on file  Intimate Partner Violence: Not on file  Family History:  Family History  Problem Relation Age of Onset   Diabetes Mother    Heart Problems Mother     Medications:   Current Outpatient Medications on File Prior to Visit  Medication Sig Dispense Refill   apixaban (ELIQUIS) 2.5 MG TABS tablet Take 1 tablet (2.5 mg total) by mouth 2 (two) times daily. 60 tablet 0   Cholecalciferol 25 MCG (1000 UT) tablet Take 1,000 Units by mouth daily.     diltiazem (DILACOR XR) 180 MG 24 hr capsule Take 1 capsule (180 mg total) by mouth daily. (Patient taking differently: Take 180 mg by mouth in the morning.) 60 capsule 0   furosemide (LASIX) 40 MG tablet Take 1 tablet (40 mg total) by mouth every Monday, Wednesday, and Friday. 30 tablet 3   isosorbide mononitrate (IMDUR) 30 MG 24 hr tablet Take 30 mg by mouth  in the morning.     metoprolol tartrate (LOPRESSOR) 25 MG tablet Take 0.5 tablets (12.5 mg total) by mouth 2 (two) times daily. 90 tablet 3   Multiple Vitamin (MULTIVITAMIN WITH MINERALS) TABS tablet Take 1 tablet by mouth daily. 30 tablet 0   pantoprazole (PROTONIX) 40 MG tablet Take 1 tablet (40 mg total) by mouth 2 (two) times daily. (Patient taking differently: Take 40 mg by mouth 2 (two) times daily before a meal.) 180 tablet 3   polycarbophil (FIBERCON) 625 MG tablet Take 1 tablet (625 mg total) by mouth daily. 30 tablet 0   polyethylene glycol (MIRALAX / GLYCOLAX) 17 g packet Take 17 g by mouth 2 (two) times daily. 14 each 0   pravastatin (PRAVACHOL) 40 MG tablet Take 1 tablet (40 mg total) by mouth daily. 90 tablet 3   No current facility-administered medications on file prior to visit.    Allergies:   Allergies  Allergen Reactions   Lisinopril Swelling and Other (See Comments)    This caused laryngeal edema      OBJECTIVE:  Physical Exam  There were no vitals filed for this visit.  There is no height or weight on file to calculate BMI. No results found.  General: well developed, well nourished, very pleasant elderly African-American female, seated, in no evident distress Head: head normocephalic and atraumatic.   Neck: supple with no carotid or supraclavicular bruits Cardiovascular: regular rate and rhythm, no murmurs Musculoskeletal: no deformity Skin:  no rash/petichiae; 2+ pitting edema LLE, no significant edema RLE Vascular:  Normal pulses all extremities   Neurologic Exam Mental Status: Awake and fully alert.  Fluent speech and language.  Oriented to place and time. Recent and remote memory intact. Attention span, concentration and fund of knowledge appropriate. Mood and affect appropriate.  Cranial Nerves:Pupils equal, briskly reactive to light. Extraocular movements full without nystagmus. Visual fields full to confrontation. Hearing intact. Facial sensation  intact.  Mild left lower facial weakness.  Tongue, palate moves normally and symmetrically.  Motor: Normal bulk and tone. Normal strength in all tested extremity muscles except mild left deltoid, hand and HF weakness Sensory.: intact to touch , pinprick , position and vibratory sensation.  Coordination: Rapid alternating movements normal in all extremities slightly decreased left hand. Finger-to-nose and heel-to-shin performed accurately bilaterally.  Orbits right arm over left arm. Gait and Station: Arises from chair with mild difficulty. Stance is slightly hunched. Gait demonstrates slow cautious decreased stride length and LLE step height with use of rolling walker.  Tandem walking heel toe not attempted. Reflexes: 1+ and symmetric. Toes downgoing.  ASSESSMENT: Patricia English is a 88 y.o. year old female with right MCA infarct involving basal ganglia, insula and parietal lobe on 06/16/2021 likely secondary to A-fib not on [facility]. Vascular risk factors include A-fib not on AC d/t GI bleed in 10/2020, CAD, HTN, HLD, DM and advanced age.  Has had recurrent hospitalizations since discharge for GI bleeds which were felt to be hemorrhoidal - has f/u with GI in August      PLAN:  Right MCA strokes:  Residual deficit: Mild left hemiparesis and gait impairment.  Continue working with therapies for hopeful ongoing recovery.  Use of RW at all times unless otherwise instructed. LLE swelling likely post stroke as no evidence of skin color changes, warmth or tenderness. Did discuss obtaining LE ultrasound to ensure no DVT although lower suspicion and is currently on anticoagulation - she plans on further discussing with PCP at todays visit. Did discuss warning signs/red flag symptoms to monitor for which patient and daughter verbalized understanding Continue  Eliquis 2.5 mg twice daily  (lower dose d/t recurrent GI bleed) and pravastatin 40 mg daily for secondary stroke prevention.  Medications managed  by PCP/cardiologist Discussed secondary stroke prevention measures and importance of close PCP follow up for aggressive stroke risk factor management including BP goal<130/90, HLD with LDL goal<70 and DM with A1c.<7.  Stroke labs LDL 79, A1c 6.3 I have gone over the pathophysiology of stroke, warning signs and symptoms, risk factors and their management in some detail with instructions to go to the closest emergency room for symptoms of concern.     Follow up in 4 months or call earlier if needed   CC:  PCP: [name], MD    I spent 59 minutes of face-to-face and non-face-to-face time with patient and daughter.  This included previsit chart review, lab review, study review, order entry, electronic health record documentation, patient and daughter education and discussion regarding prior stroke including etiology with residual deficits, secondary stroke prevention measures and importance of managing stroke risk factors, and answered all other questions to patient and daughters satisfaction   Frann Rider, [facility]  [facility] [address] [place], [place] [address]  Phone [phone] Fax [phone] Note: This document was prepared with digital dictation and possible smart phrase technology. Any transcriptional errors that result from this process are unintentional.

## 2021-12-16 ENCOUNTER — Ambulatory Visit (INDEPENDENT_AMBULATORY_CARE_PROVIDER_SITE_OTHER): Payer: Medicare Other | Admitting: Adult Health

## 2021-12-16 ENCOUNTER — Ambulatory Visit: Payer: Medicare Other | Admitting: Adult Health

## 2021-12-16 ENCOUNTER — Encounter: Payer: Self-pay | Admitting: Adult Health

## 2021-12-16 VITALS — BP 122/72 | HR 76 | Ht 69.0 in | Wt 168.6 lb

## 2021-12-16 DIAGNOSIS — I63411 Cerebral infarction due to embolism of right middle cerebral artery: Secondary | ICD-10-CM

## 2021-12-16 DIAGNOSIS — G8194 Hemiplegia, unspecified affecting left nondominant side: Secondary | ICD-10-CM | POA: Diagnosis not present

## 2021-12-16 NOTE — Patient Instructions (Signed)
Continue  Eliquis 2.5 mg twice daily   and pravastatin for secondary stroke prevention  Continue to follow up with PCP regarding cholesterol and blood pressure management  Maintain strict control of hypertension with blood pressure goal below 130/90 and cholesterol with LDL cholesterol (bad cholesterol) goal below 70 mg/dL.   Signs of a Stroke? Follow the BEFAST method:  Balance Watch for a sudden loss of balance, trouble with coordination or vertigo Eyes Is there a sudden loss of vision in one or both eyes? Or double vision?  Face: Ask the person to smile. Does one side of the face droop or is it numb?  Arms: Ask the person to raise both arms. Does one arm drift downward? Is there weakness or numbness of a leg? Speech: Ask the person to repeat a simple phrase. Does the speech sound slurred/strange? Is the person confused ? Time: If you observe any of these signs, call 911.       Thank you for coming to see [place] at [facility]. I hope we have been able to provide you high quality care today.  You may receive a patient satisfaction survey over the next few weeks. We would appreciate your feedback and comments so that we may continue to improve ourselves and the health of our patients.

## 2021-12-21 ENCOUNTER — Other Ambulatory Visit: Payer: Self-pay | Admitting: Internal Medicine

## 2021-12-21 ENCOUNTER — Other Ambulatory Visit: Payer: Self-pay | Admitting: Student

## 2022-01-11 ENCOUNTER — Other Ambulatory Visit: Payer: Self-pay | Admitting: Internal Medicine

## 2022-01-11 ENCOUNTER — Other Ambulatory Visit: Payer: Self-pay | Admitting: Student

## 2022-01-12 ENCOUNTER — Other Ambulatory Visit: Payer: Self-pay

## 2022-01-12 MED ORDER — DILTIAZEM HCL ER 180 MG PO CP24: 180.0000 mg | ORAL_CAPSULE | Freq: Every morning | ORAL | 3 refills | Status: DC

## 2022-01-24 ENCOUNTER — Other Ambulatory Visit: Payer: Self-pay | Admitting: Physical Medicine and Rehabilitation

## 2022-02-21 ENCOUNTER — Other Ambulatory Visit: Payer: Self-pay | Admitting: Physical Medicine and Rehabilitation

## 2022-02-21 ENCOUNTER — Other Ambulatory Visit: Payer: Self-pay | Admitting: Student

## 2022-02-21 ENCOUNTER — Other Ambulatory Visit: Payer: Self-pay | Admitting: Internal Medicine

## 2022-05-08 ENCOUNTER — Other Ambulatory Visit: Payer: Self-pay | Admitting: Internal Medicine

## 2022-06-09 NOTE — Telephone Encounter (Signed)
Wrong dosage.

## 2022-06-12 ENCOUNTER — Other Ambulatory Visit: Payer: Self-pay | Admitting: Student

## 2022-06-12 ENCOUNTER — Other Ambulatory Visit: Payer: Self-pay | Admitting: Internal Medicine

## 2022-06-19 ENCOUNTER — Other Ambulatory Visit: Payer: Self-pay | Admitting: Physical Medicine and Rehabilitation

## 2022-09-09 ENCOUNTER — Other Ambulatory Visit: Payer: Self-pay

## 2022-09-09 MED ORDER — FUROSEMIDE 40 MG PO TABS: 40.0000 mg | ORAL_TABLET | ORAL | 3 refills | Status: DC

## 2023-03-08 ENCOUNTER — Other Ambulatory Visit: Payer: Self-pay | Admitting: Physical Medicine and Rehabilitation

## 2023-03-28 ENCOUNTER — Ambulatory Visit (HOSPITAL_COMMUNITY)
Admission: EM | Admit: 2023-03-28 | Discharge: 2023-03-28 | Disposition: A | Discharge status: Home / Self Care | Payer: Medicare Other | Attending: Physician Assistant | Admitting: Physician Assistant

## 2023-03-28 ENCOUNTER — Encounter (HOSPITAL_COMMUNITY): Payer: Self-pay | Admitting: *Deleted

## 2023-03-28 ENCOUNTER — Ambulatory Visit (INDEPENDENT_AMBULATORY_CARE_PROVIDER_SITE_OTHER): Payer: Medicare Other

## 2023-03-28 DIAGNOSIS — M25571 Pain in right ankle and joints of right foot: Secondary | ICD-10-CM | POA: Diagnosis not present

## 2023-03-28 DIAGNOSIS — S93401A Sprain of unspecified ligament of right ankle, initial encounter: Secondary | ICD-10-CM | POA: Diagnosis not present

## 2023-03-28 DIAGNOSIS — W19XXXA Unspecified fall, initial encounter: Secondary | ICD-10-CM | POA: Diagnosis not present

## 2023-03-28 NOTE — ED Provider Notes (Addendum)
[facility]    CSN: [id number] Arrival date & time: 03/28/23  1207      History   Chief Complaint Chief Complaint  Patient presents with   Fall   Ankle Pain    HPI Patricia English is a 89 y.o. female.   HPI  She states she fell yesterday and now has right ankle pain She reports she was able to walk on the foot/ankle yesterday but last night, around midnight, it started to hurt  She has not been taking anything for pain   She denies hitting her head, LOC, nausea, vomiting  Past Medical History:  Diagnosis Date   Coronary artery disease    CVA (cerebral vascular accident) (HCC)    Diabetes mellitus without complication (HCC)    GI bleeding 11/2017   Hypertension    Internal hemorrhoids    Lesion of pancreas 07/31/2021   MR - suspect serous cystadenoma   Paroxysmal atrial fibrillation [facility])     Patient Active Problem List   Diagnosis Date Noted   Rectal bleeding 08/03/2021   Lesion of pancreas 07/31/2021   Chronic kidney disease (CKD) 07/29/2021   Atrial fibrillation (HCC) 07/29/2021   Coronary artery disease 07/29/2021   Hyperlipidemia 07/29/2021   Acute on chronic blood loss anemia 07/22/2021   Essential hypertension 06/24/2021   Acute on chronic renal failure (HCC) 06/24/2021   Anemia 06/24/2021   Acute ischemic right MCA stroke (HCC) 06/21/2021   Acute stroke due to ischemia (HCC) 06/16/2021   DM2 (diabetes mellitus, type 2) (HCC) 10/28/2020   Weakness 10/28/2020    Class: Acute   Acute upper GI bleed 10/28/2020   Internal and external bleeding hemorrhoids    Acute lower GI bleeding 12/08/2017   Dysphagia 08/11/2017    Class: Acute    Past Surgical History:  Procedure Laterality Date   BIOPSY  10/30/2020   Procedure: BIOPSY;  Surgeon: Imogene Burn, MD;  Location: [facility] ENDOSCOPY;  Service: Gastroenterology;;   BIOPSY  10/31/2020   Procedure: BIOPSY;  Surgeon: Imogene Burn, MD;  Location: [facility] ENDOSCOPY;  Service: Gastroenterology;;    CATARACT EXTRACTION     COLONOSCOPY WITH PROPOFOL N/A 10/31/2020   Procedure: COLONOSCOPY WITH PROPOFOL;  Surgeon: Imogene Burn, MD;  Location: [facility] ENDOSCOPY;  Service: Gastroenterology;  Laterality: N/A;   ESOPHAGOGASTRODUODENOSCOPY (EGD) WITH PROPOFOL N/A 08/13/2017   Procedure: ESOPHAGOGASTRODUODENOSCOPY (EGD) WITH PROPOFOL;  Surgeon: Iva Boop, MD;  Location: [facility] ENDOSCOPY;  Service: Endoscopy;  Laterality: N/A;   ESOPHAGOGASTRODUODENOSCOPY (EGD) WITH PROPOFOL N/A 10/30/2020   Procedure: ESOPHAGOGASTRODUODENOSCOPY (EGD) WITH PROPOFOL;  Surgeon: Imogene Burn, MD;  Location: [facility] ENDOSCOPY;  Service: Gastroenterology;  Laterality: N/A;   FLEXIBLE SIGMOIDOSCOPY N/A 12/10/2017   Procedure: FLEXIBLE SIGMOIDOSCOPY;  Surgeon: Iva Boop, MD;  Location: [facility] ENDOSCOPY;  Service: Endoscopy;  Laterality: N/A;   FLEXIBLE SIGMOIDOSCOPY N/A 07/22/2021   Procedure: FLEXIBLE SIGMOIDOSCOPY;  Surgeon: Meridee Score Netty Starring., MD;  Location: [facility] ENDOSCOPY;  Service: Gastroenterology;  Laterality: N/A;   FLEXIBLE SIGMOIDOSCOPY N/A 07/30/2021   Procedure: FLEXIBLE SIGMOIDOSCOPY;  Surgeon: Iva Boop, MD;  Location: [facility] ENDOSCOPY;  Service: Gastroenterology;  Laterality: N/A;   HEMORRHOID BANDING  12/10/2017   Procedure: HEMORRHOID BANDING;  Surgeon: Iva Boop, MD;  Location: [facility] ENDOSCOPY;  Service: Endoscopy;;   HEMORRHOID BANDING  07/30/2021   Procedure: [place];  Surgeon: Iva Boop, MD;  Location: [facility] ENDOSCOPY;  Service: Gastroenterology;;   HEMOSTASIS CLIP PLACEMENT  10/31/2020   Procedure: HEMOSTASIS CLIP PLACEMENT;  Surgeon: Imogene Burn, MD;  Location: [facility] ENDOSCOPY;  Service: Gastroenterology;;   POLYPECTOMY  10/31/2020   Procedure: POLYPECTOMY;  Surgeon: Imogene Burn, MD;  Location: [facility] ENDOSCOPY;  Service: Gastroenterology;;    OB History   No obstetric history on file.      Home Medications    Prior to Admission medications   Medication Sig Start Date End Date  Taking? Authorizing Provider  apixaban (ELIQUIS) 2.5 MG TABS tablet Take 1 tablet (2.5 mg total) by mouth 2 (two) times daily. 08/05/21  Yes Masters, Katie, DO  Cholecalciferol 25 MCG (1000 UT) tablet Take 1,000 Units by mouth daily.   Yes [provider]  DILT-XR 180 MG 24 hr capsule TAKE 1 CAPSULE (180 MG) BY MOUTH IN THE MORNING 03/13/23  Yes Raulkar, Drema Pry, MD  furosemide (LASIX) 40 MG tablet Take 1 tablet (40 mg total) by mouth every Monday, Wednesday, and Friday. 09/09/22  Yes Raulkar, Drema Pry, MD  isosorbide mononitrate (IMDUR) 30 MG 24 hr tablet Take 30 mg by mouth in the morning.   Yes [provider]  metoprolol tartrate (LOPRESSOR) 25 MG tablet Take 0.5 tablets (12.5 mg total) by mouth 2 (two) times daily. 07/18/21  Yes Rinaldo Cloud, MD  Multiple Vitamin (MULTIVITAMIN WITH MINERALS) TABS tablet Take 1 tablet by mouth daily. 08/05/21  Yes Masters, Katie, DO  pantoprazole (PROTONIX) 40 MG tablet Take 1 tablet (40 mg total) by mouth 2 (two) times daily before a meal. 03/13/23  Yes Raulkar, Drema Pry, MD  polycarbophil (FIBERCON) 625 MG tablet Take 1 tablet (625 mg total) by mouth daily. 07/23/21  Yes Masters, Katie, DO  pravastatin (PRAVACHOL) 40 MG tablet Take 1 tablet (40 mg total) by mouth daily. 07/13/21  Yes Raulkar, Drema Pry, MD  polyethylene glycol (MIRALAX / GLYCOLAX) 17 g packet Take 17 g by mouth 2 (two) times daily. 08/05/21   Masters, Florentina Addison, DO    Family History Family History  Problem Relation Age of Onset   Diabetes Mother    Heart Problems Mother     Social History Social History   Tobacco Use   Smoking status: Never   Smokeless tobacco: Never  Vaping Use   Vaping status: Never Used  Substance Use Topics   Alcohol use: No   Drug use: Never     Allergies   Lisinopril   Review of Systems Review of Systems   Physical Exam Triage Vital Signs ED Triage Vitals  Encounter Vitals Group     BP 03/28/23 1337 117/64     Systolic BP  Percentile --      Diastolic BP Percentile --      Pulse Rate 03/28/23 1337 75     Resp 03/28/23 1337 16     Temp 03/28/23 1337 98.6 F (37 C)     Temp Source 03/28/23 1337 Oral     SpO2 03/28/23 1337 98 %     Weight --      Height --      Head Circumference --      Peak Flow --      Pain Score 03/28/23 1335 7     Pain Loc --      Pain Education --      Exclude from Growth Chart --    No data found.  Updated Vital Signs BP 117/64 (BP Location: Left Arm)   Pulse 75   Temp 98.6 F (37 C) (Oral)   Resp 16   SpO2 98%  Visual Acuity Right Eye Distance:   Left Eye Distance:   Bilateral Distance:    Right Eye Near:   Left Eye Near:    Bilateral Near:     Physical Exam Vitals reviewed.  Constitutional:      Appearance: Normal appearance.  HENT:     Head: Normocephalic and atraumatic.  Pulmonary:     Effort: Pulmonary effort is normal.  Musculoskeletal:     Cervical back: Normal range of motion.     Right ankle: Swelling present. No deformity, ecchymosis or lacerations. Tenderness present over the lateral malleolus. Normal range of motion. Anterior drawer test negative. Normal pulse.     Right foot: Normal. Normal range of motion. No swelling. Normal pulse.     Comments: Ankle ROM is overall intact with regards to supination, pronation, dorsiflexion, plantarflexion, lateral and medial rotation. 5 out of 5 strength with dorsiflexion and plantarflexion bilaterally. Dorsalis pedis is 2+ on the right foot  Skin:    General: Skin is warm and dry.  Neurological:     General: No focal deficit present.     Mental Status: She is alert and oriented to person, place, and time.  Psychiatric:        Mood and Affect: Mood normal.        Behavior: Behavior normal.        Thought Content: Thought content normal.        Judgment: Judgment normal.      UC Treatments / Results  Labs (all labs ordered are listed, but only abnormal results are displayed) Labs Reviewed - No data  to display  EKG   Radiology DG Ankle Complete Right Result Date: 03/28/2023 CLINICAL DATA:  Fall injury swelling and pain EXAM: RIGHT ANKLE - COMPLETE 3+ VIEW COMPARISON:  None Available. FINDINGS: No fracture or malalignment. Ankle mortise is symmetric. Abundant soft tissue swelling. Small plantar calcaneal spur IMPRESSION: No acute osseous abnormality. Electronically Signed   By: Jasmine Pang M.D.   On: 03/28/2023 15:36    Procedures Procedures (including critical care time)  Medications Ordered in UC Medications - No data to display  Initial Impression / Assessment and Plan / UC Course  I have reviewed the triage vital signs and the nursing notes.  Pertinent labs & imaging results that were available during my care of the patient were reviewed by me and considered in my medical decision making (see chart for details).     I have independently reviewed the x-rays of the right ankle.  There does not appear to be obvious fracture or dislocation at this time.  There is significant swelling along the lateral malleolus which is notable in the x-ray as well.  Awaiting radiology overread for full confirmation.   Final Clinical Impressions(s) / UC Diagnoses   Final diagnoses:  Fall, initial encounter  Acute right ankle pain  Sprain of right ankle, unspecified ligament, initial encounter   Acute, new concern Patient reports that she fell yesterday and hurt her right ankle.  She denies hitting her head, LOC, nausea, vomiting, amnesia.  She states that she was walking on the ankle/foot all day yesterday but started having pain last night around midnight.  Physical exam is notable for significant lateral malleolus swelling but ROM appears intact.  Strength is 5 out of 5 with dorsi and plantarflexion.  Overall she appears to be neurovascularly intact and is able to walk with the assistance of a walker. Imaging of the ankle was benign per my interpretation and  radiology.  At this time will  apply Ace bandage for stabilization and compression.  Recommend using Tylenol as needed for pain management given reduced EGFR.  Safety information regarding falls, staying safe in the home was provided in after visit summary.  Recommend stabilization with Ace wrap or ankle brace as desired along with elevating the area and using warm compresses.  Follow-up as needed for progressing or persistent symptoms    Discharge Instructions      At this time I suspect you have likely sprained your right ankle. We have applied an ankle brace to help increase stabilization.  You can take Tylenol as needed for pain management.  I have also provided some stretches for you to do to prevent stiffness.  You can use warm compresses and elevate the area to help with swelling and pain management.  If you notice increased swelling, increased pain, difficulty moving your foot or ankle, discoloration, diabetes to the area please go to the emergency room immediately for follow-up.      ED Prescriptions   None    PDMP not reviewed this encounter.   Providence Crosby, PA-C 03/28/23 1541    Germaine Ripp, Oswaldo Conroy, PA-C 03/28/23 1542

## 2023-03-28 NOTE — ED Triage Notes (Signed)
Pt states she fell yesterday and she is having right ankle pain and swelling. She hasn't taken any meds for the injury.

## 2023-03-28 NOTE — Discharge Instructions (Addendum)
At this time I suspect you have likely sprained your right ankle. We have applied an ankle brace to help increase stabilization.  You can take Tylenol as needed for pain management.  I have also provided some stretches for you to do to prevent stiffness.  You can use warm compresses and elevate the area to help with swelling and pain management.  If you notice increased swelling, increased pain, difficulty moving your foot or ankle, discoloration, diabetes to the area please go to the emergency room immediately for follow-up.

## 2023-04-15 ENCOUNTER — Inpatient Hospital Stay (HOSPITAL_COMMUNITY)
Admission: EM | Admit: 2023-04-15 | Discharge: 2023-04-18 | DRG: 378 | Disposition: A | Discharge status: Home / Self Care | Attending: Internal Medicine | Admitting: Internal Medicine

## 2023-04-15 ENCOUNTER — Encounter (HOSPITAL_COMMUNITY): Payer: Self-pay | Admitting: Emergency Medicine

## 2023-04-15 ENCOUNTER — Other Ambulatory Visit: Payer: Self-pay

## 2023-04-15 DIAGNOSIS — Z8673 Personal history of transient ischemic attack (TIA), and cerebral infarction without residual deficits: Secondary | ICD-10-CM

## 2023-04-15 DIAGNOSIS — K625 Hemorrhage of anus and rectum: Secondary | ICD-10-CM | POA: Diagnosis not present

## 2023-04-15 DIAGNOSIS — E785 Hyperlipidemia, unspecified: Secondary | ICD-10-CM | POA: Diagnosis present

## 2023-04-15 DIAGNOSIS — K644 Residual hemorrhoidal skin tags: Secondary | ICD-10-CM | POA: Diagnosis present

## 2023-04-15 DIAGNOSIS — I480 Paroxysmal atrial fibrillation: Secondary | ICD-10-CM | POA: Diagnosis present

## 2023-04-15 DIAGNOSIS — K5731 Diverticulosis of large intestine without perforation or abscess with bleeding: Principal | ICD-10-CM

## 2023-04-15 DIAGNOSIS — I959 Hypotension, unspecified: Secondary | ICD-10-CM | POA: Diagnosis present

## 2023-04-15 DIAGNOSIS — Z833 Family history of diabetes mellitus: Secondary | ICD-10-CM

## 2023-04-15 DIAGNOSIS — K648 Other hemorrhoids: Secondary | ICD-10-CM

## 2023-04-15 DIAGNOSIS — Z7901 Long term (current) use of anticoagulants: Secondary | ICD-10-CM

## 2023-04-15 DIAGNOSIS — D6832 Hemorrhagic disorder due to extrinsic circulating anticoagulants: Secondary | ICD-10-CM | POA: Diagnosis present

## 2023-04-15 DIAGNOSIS — I129 Hypertensive chronic kidney disease with stage 1 through stage 4 chronic kidney disease, or unspecified chronic kidney disease: Secondary | ICD-10-CM | POA: Diagnosis present

## 2023-04-15 DIAGNOSIS — K921 Melena: Secondary | ICD-10-CM

## 2023-04-15 DIAGNOSIS — T45515A Adverse effect of anticoagulants, initial encounter: Secondary | ICD-10-CM | POA: Diagnosis present

## 2023-04-15 DIAGNOSIS — D62 Acute posthemorrhagic anemia: Secondary | ICD-10-CM | POA: Diagnosis not present

## 2023-04-15 DIAGNOSIS — Z888 Allergy status to other drugs, medicaments and biological substances status: Secondary | ICD-10-CM

## 2023-04-15 DIAGNOSIS — I2510 Atherosclerotic heart disease of native coronary artery without angina pectoris: Secondary | ICD-10-CM | POA: Diagnosis present

## 2023-04-15 DIAGNOSIS — E1122 Type 2 diabetes mellitus with diabetic chronic kidney disease: Secondary | ICD-10-CM | POA: Diagnosis present

## 2023-04-15 DIAGNOSIS — Z79899 Other long term (current) drug therapy: Secondary | ICD-10-CM

## 2023-04-15 DIAGNOSIS — K219 Gastro-esophageal reflux disease without esophagitis: Secondary | ICD-10-CM | POA: Diagnosis present

## 2023-04-15 DIAGNOSIS — D509 Iron deficiency anemia, unspecified: Secondary | ICD-10-CM

## 2023-04-15 DIAGNOSIS — K922 Gastrointestinal hemorrhage, unspecified: Principal | ICD-10-CM

## 2023-04-15 LAB — CBC
HCT: 23 % — ABNORMAL LOW (ref 36.0–46.0)
Hemoglobin: 6.6 g/dL — CL (ref 12.0–15.0)
MCH: 21.6 pg — ABNORMAL LOW (ref 26.0–34.0)
MCHC: 28.7 g/dL — ABNORMAL LOW (ref 30.0–36.0)
MCV: 75.4 fL — ABNORMAL LOW (ref 80.0–100.0)
Platelets: 394 10*3/uL (ref 150–400)
RBC: 3.05 MIL/uL — ABNORMAL LOW (ref 3.87–5.11)
RDW: 17.9 % — ABNORMAL HIGH (ref 11.5–15.5)
WBC: 7.1 10*3/uL (ref 4.0–10.5)
nRBC: 0 % (ref 0.0–0.2)

## 2023-04-15 LAB — COMPREHENSIVE METABOLIC PANEL
ALT: 9 U/L (ref 0–44)
AST: 16 U/L (ref 15–41)
Albumin: 3.5 g/dL (ref 3.5–5.0)
Alkaline Phosphatase: 69 U/L (ref 38–126)
Anion gap: 9 (ref 5–15)
BUN: 31 mg/dL — ABNORMAL HIGH (ref 8–23)
CO2: 24 mmol/L (ref 22–32)
Calcium: 9.1 mg/dL (ref 8.9–10.3)
Chloride: 104 mmol/L (ref 98–111)
Creatinine, Ser: 1.77 mg/dL — ABNORMAL HIGH (ref 0.44–1.00)
GFR, Estimated: 26 mL/min — ABNORMAL LOW (ref >60)
Glucose, Bld: 143 mg/dL — ABNORMAL HIGH (ref 70–99)
Potassium: 3.7 mmol/L (ref 3.5–5.1)
Sodium: 137 mmol/L (ref 135–145)
Total Bilirubin: 0.8 mg/dL (ref 0.0–1.2)
Total Protein: 7.4 g/dL (ref 6.5–8.1)

## 2023-04-15 LAB — PROTIME-INR
INR: 1.7 — ABNORMAL HIGH (ref 0.8–1.2)
Prothrombin Time: 20.2 s — ABNORMAL HIGH (ref 11.4–15.2)

## 2023-04-15 LAB — HEMOGLOBIN AND HEMATOCRIT, BLOOD
HCT: 22.4 % — ABNORMAL LOW (ref 36.0–46.0)
Hemoglobin: 6.6 g/dL — CL (ref 12.0–15.0)

## 2023-04-15 LAB — PREPARE RBC (CROSSMATCH)

## 2023-04-15 LAB — GLUCOSE, CAPILLARY
Glucose-Capillary: 108 mg/dL — ABNORMAL HIGH (ref 70–99)
Glucose-Capillary: 147 mg/dL — ABNORMAL HIGH (ref 70–99)

## 2023-04-15 MED ORDER — INSULIN ASPART 100 UNIT/ML IJ SOLN
0.0000 [IU] | Freq: Every day | INTRAMUSCULAR | Status: DC
  Filled 2023-04-15: qty 0.05

## 2023-04-15 MED ORDER — SODIUM CHLORIDE 0.9 % IV BOLUS
500.0000 mL | Freq: Once | INTRAVENOUS | Status: AC
  Administered 2023-04-15: 500 mL via INTRAVENOUS

## 2023-04-15 MED ORDER — METOPROLOL TARTRATE 25 MG PO TABS
12.5000 mg | ORAL_TABLET | Freq: Two times a day (BID) | ORAL | Status: DC
  Administered 2023-04-15 – 2023-04-18 (×6): 12.5 mg via ORAL
  Filled 2023-04-15 (×6): qty 1

## 2023-04-15 MED ORDER — INSULIN ASPART 100 UNIT/ML IJ SOLN
0.0000 [IU] | Freq: Three times a day (TID) | INTRAMUSCULAR | Status: DC
Start: 2023-04-15 — End: 2023-04-18
  Administered 2023-04-16: 2 [IU] via SUBCUTANEOUS
  Administered 2023-04-17 – 2023-04-18 (×2): 1 [IU] via SUBCUTANEOUS
  Filled 2023-04-15: qty 0.09

## 2023-04-15 MED ORDER — TRAZODONE HCL 50 MG PO TABS
25.0000 mg | ORAL_TABLET | Freq: Every evening | ORAL | Status: DC | PRN
  Administered 2023-04-16 – 2023-04-17 (×2): 25 mg via ORAL
  Filled 2023-04-15 (×2): qty 1

## 2023-04-15 MED ORDER — ONDANSETRON HCL 4 MG/2ML IJ SOLN
4.0000 mg | Freq: Four times a day (QID) | INTRAMUSCULAR | Status: DC | PRN
Start: 2023-04-15 — End: 2023-04-18

## 2023-04-15 MED ORDER — PRAVASTATIN SODIUM 40 MG PO TABS
40.0000 mg | ORAL_TABLET | Freq: Every day | ORAL | Status: DC
  Administered 2023-04-16 – 2023-04-18 (×3): 40 mg via ORAL
  Filled 2023-04-15 (×3): qty 1

## 2023-04-15 MED ORDER — SODIUM CHLORIDE 0.9% IV SOLUTION: Freq: Once | INTRAVENOUS | Status: AC

## 2023-04-15 MED ORDER — ACETAMINOPHEN 650 MG RE SUPP: 650.0000 mg | Freq: Four times a day (QID) | RECTAL | Status: DC | PRN

## 2023-04-15 MED ORDER — PANTOPRAZOLE SODIUM 40 MG PO TBEC
40.0000 mg | DELAYED_RELEASE_TABLET | Freq: Two times a day (BID) | ORAL | Status: DC
Start: 2023-04-15 — End: 2023-04-18
  Administered 2023-04-15 – 2023-04-18 (×6): 40 mg via ORAL
  Filled 2023-04-15 (×6): qty 1

## 2023-04-15 MED ORDER — ACETAMINOPHEN 325 MG PO TABS: 650.0000 mg | ORAL_TABLET | Freq: Four times a day (QID) | ORAL | Status: DC | PRN

## 2023-04-15 MED ORDER — ONDANSETRON HCL 4 MG PO TABS
4.0000 mg | ORAL_TABLET | Freq: Four times a day (QID) | ORAL | Status: DC | PRN
  Administered 2023-04-18: 4 mg via ORAL
  Filled 2023-04-15: qty 1

## 2023-04-15 NOTE — H&P (Signed)
 History and Physical  Patricia English VZD:[id number] DOB: 08-13-1927 DOA: 04/15/2023  PCP: Orpah Cobb, MD   Chief Complaint: Bright red blood per rectum  HPI: Patricia English is a 89 y.o. female with medical history significant for hypertension, atrial fibrillation on Eliquis, diabetes being admitted to the hospital with bright red blood per rectum.  She has a prior history of hemorrhoidal bleeding which required banding in 2023.  2 weeks ago, she had 1 small episode of bright red blood per rectum, but it resolved on its own.  Since last night however, she has had about 4 bloody bowel movements, feeling like the blood was just pouring out of her.  She did feel a little weak, dizzy and lightheaded.  Denies any chest pain.  No nausea or vomiting.  She last took her Eliquis this morning.  She has not had a bowel movement today.  Came to the ER for evaluation, where workup as detailed below significant for acute blood loss anemia.  ER provider discussed with gastroenterology who will be seeing the patient in consultation, and has ordered 1 unit PRBC.  Review of Systems: Please see HPI for pertinent positives and negatives. A complete 10 system review of systems are otherwise negative.  Past Medical History:  Diagnosis Date   Coronary artery disease    CVA (cerebral vascular accident) (HCC)    Diabetes mellitus without complication (HCC)    GI bleeding 11/2017   Hypertension    Internal hemorrhoids    Lesion of pancreas 07/31/2021   MR - suspect serous cystadenoma   Paroxysmal atrial fibrillation [facility])    Past Surgical History:  Procedure Laterality Date   BIOPSY  10/30/2020   Procedure: BIOPSY;  Surgeon: Imogene Burn, MD;  Location: [facility] ENDOSCOPY;  Service: Gastroenterology;;   BIOPSY  10/31/2020   Procedure: BIOPSY;  Surgeon: Imogene Burn, MD;  Location: [facility] ENDOSCOPY;  Service: Gastroenterology;;   CATARACT EXTRACTION     COLONOSCOPY WITH PROPOFOL N/A 10/31/2020   Procedure:  COLONOSCOPY WITH PROPOFOL;  Surgeon: Imogene Burn, MD;  Location: [facility] ENDOSCOPY;  Service: Gastroenterology;  Laterality: N/A;   ESOPHAGOGASTRODUODENOSCOPY (EGD) WITH PROPOFOL N/A 08/13/2017   Procedure: ESOPHAGOGASTRODUODENOSCOPY (EGD) WITH PROPOFOL;  Surgeon: Iva Boop, MD;  Location: [facility] ENDOSCOPY;  Service: Endoscopy;  Laterality: N/A;   ESOPHAGOGASTRODUODENOSCOPY (EGD) WITH PROPOFOL N/A 10/30/2020   Procedure: ESOPHAGOGASTRODUODENOSCOPY (EGD) WITH PROPOFOL;  Surgeon: Imogene Burn, MD;  Location: [facility] ENDOSCOPY;  Service: Gastroenterology;  Laterality: N/A;   FLEXIBLE SIGMOIDOSCOPY N/A 12/10/2017   Procedure: FLEXIBLE SIGMOIDOSCOPY;  Surgeon: Iva Boop, MD;  Location: [facility] ENDOSCOPY;  Service: Endoscopy;  Laterality: N/A;   FLEXIBLE SIGMOIDOSCOPY N/A 07/22/2021   Procedure: FLEXIBLE SIGMOIDOSCOPY;  Surgeon: Meridee Score Netty Starring., MD;  Location: [facility] ENDOSCOPY;  Service: Gastroenterology;  Laterality: N/A;   FLEXIBLE SIGMOIDOSCOPY N/A 07/30/2021   Procedure: FLEXIBLE SIGMOIDOSCOPY;  Surgeon: Iva Boop, MD;  Location: [facility] ENDOSCOPY;  Service: Gastroenterology;  Laterality: N/A;   HEMORRHOID BANDING  12/10/2017   Procedure: HEMORRHOID BANDING;  Surgeon: Iva Boop, MD;  Location: [facility] ENDOSCOPY;  Service: Endoscopy;;   HEMORRHOID BANDING  07/30/2021   Procedure: [place];  Surgeon: Iva Boop, MD;  Location: [facility] ENDOSCOPY;  Service: Gastroenterology;;   HEMOSTASIS CLIP PLACEMENT  10/31/2020   Procedure: HEMOSTASIS CLIP PLACEMENT;  Surgeon: Imogene Burn, MD;  Location: [facility] ENDOSCOPY;  Service: Gastroenterology;;   POLYPECTOMY  10/31/2020   Procedure: POLYPECTOMY;  Surgeon: Imogene Burn, MD;  Location: [facility] ENDOSCOPY;  Service: Gastroenterology;;   Social History:  reports that she has never smoked. She has never used smokeless tobacco. She reports that she does not drink alcohol and does not use drugs.  Allergies  Allergen Reactions   Lisinopril Swelling and Other  (See Comments)    This caused laryngeal edema    Family History  Problem Relation Age of Onset   Diabetes Mother    Heart Problems Mother      Prior to Admission medications   Medication Sig Start Date End Date Taking? Authorizing Provider  apixaban (ELIQUIS) 2.5 MG TABS tablet Take 1 tablet (2.5 mg total) by mouth 2 (two) times daily. 08/05/21   Masters, Florentina Addison, DO  Cholecalciferol 25 MCG (1000 UT) tablet Take 1,000 Units by mouth daily.    [provider]  DILT-XR 180 MG 24 hr capsule TAKE 1 CAPSULE (180 MG) BY MOUTH IN THE MORNING 03/13/23   Raulkar, Drema Pry, MD  furosemide (LASIX) 40 MG tablet Take 1 tablet (40 mg total) by mouth every Monday, Wednesday, and Friday. 09/09/22   Raulkar, Drema Pry, MD  isosorbide mononitrate (IMDUR) 30 MG 24 hr tablet Take 30 mg by mouth in the morning.    [provider]  metoprolol tartrate (LOPRESSOR) 25 MG tablet Take 0.5 tablets (12.5 mg total) by mouth 2 (two) times daily. 07/18/21   Rinaldo Cloud, MD  Multiple Vitamin (MULTIVITAMIN WITH MINERALS) TABS tablet Take 1 tablet by mouth daily. 08/05/21   Masters, Katie, DO  pantoprazole (PROTONIX) 40 MG tablet Take 1 tablet (40 mg total) by mouth 2 (two) times daily before a meal. 03/13/23   Raulkar, Drema Pry, MD  polycarbophil (FIBERCON) 625 MG tablet Take 1 tablet (625 mg total) by mouth daily. 07/23/21   Masters, Katie, DO  polyethylene glycol (MIRALAX / GLYCOLAX) 17 g packet Take 17 g by mouth 2 (two) times daily. 08/05/21   Masters, Katie, DO  pravastatin (PRAVACHOL) 40 MG tablet Take 1 tablet (40 mg total) by mouth daily. 07/13/21   Raulkar, Drema Pry, MD    Physical Exam: BP (!) 106/49   Pulse 77   Temp (!) 97.5 F (36.4 C) (Oral)   Resp 18   SpO2 100%  General:  Alert, oriented, calm, in no acute distress, she looks younger than her stated age and is a good historian Cardiovascular: RRR, no murmurs or rubs, no peripheral edema  Respiratory: clear to auscultation bilaterally, no  wheezes, no crackles  Abdomen: soft, nontender, nondistended, normal bowel tones heard  Skin: dry, no rashes  Musculoskeletal: no joint effusions, normal range of motion  Psychiatric: appropriate affect, normal speech  Neurologic: extraocular muscles intact, clear speech, moving all extremities with intact sensorium         Labs on Admission:  Basic Metabolic Panel: Recent Labs  Lab 04/15/23 1325  NA 137  K 3.7  CL 104  CO2 24  GLUCOSE 143*  BUN 31*  CREATININE 1.77*  CALCIUM 9.1   Liver Function Tests: Recent Labs  Lab 04/15/23 1325  AST 16  ALT 9  ALKPHOS 69  BILITOT 0.8  PROT 7.4  ALBUMIN 3.5   No results for input(s): "LIPASE", "AMYLASE" in the last 168 hours. No results for input(s): "AMMONIA" in the last 168 hours. CBC: Recent Labs  Lab 04/15/23 1325  WBC 7.1  HGB 6.6*  HCT 23.0*  MCV 75.4*  PLT 394   Cardiac Enzymes: No results for input(s): "CKTOTAL", "CKMB", "CKMBINDEX", "TROPONINI" in the last 168 hours.  BNP (last 3 results) No results for input(s): "BNP" in the last 8760 hours.  ProBNP (last 3 results) No results for input(s): "PROBNP" in the last 8760 hours.  CBG: No results for input(s): "GLUCAP" in the last 168 hours.  Radiological Exams on Admission: No results found. Assessment/Plan Fauna Milliner is a 89 y.o. female with medical history significant for hypertension, atrial fibrillation on Eliquis, diabetes being admitted to the hospital with bright red blood per rectum and symptomatic acute blood loss anemia.  Acute blood loss anemia-in the setting of lower GI bleed with bright red blood per rectum.  Likely diverticular versus recurrent hemorrhoidal bleeding.  She is slightly hypotensive, but hemodynamically stable. -Observation admission to progressive unit -Cardiac telemetry -Transfuse 1 unit PRBC -Monitor hemoglobin every 8 hours -Clear liquid diet -ER provider has discussed with GI Dr. Lavon Paganini, who will  consult  Hypertension-hold Cardizem and Imdur due to relative hypotension  Paroxysmal atrial fibrillation-continue home metoprolol dose to avoid rapid A-fib.  Holding Eliquis, last dose was this morning 3/1.  GERD-Protonix p.o.  Type 2 diabetes-carb modified diet when eating, with moderate dose sliding scale.  Will update A1c.  DVT prophylaxis: SCDs only    Code Status: Full Code  Consults called: Gastroenterology Dr. Lavon Paganini  Admission status: Observation  Time spent: 49 minutes  Lanett Lasorsa Sharlette Dense MD [facility] Pager [phone]  If 7PM-7AM, please contact night-coverage [email or web address] Password [facility]  04/15/2023, 3:03 PM

## 2023-04-15 NOTE — Consult Note (Signed)
 [facility] CONSULTATION NOTE Requesting: [facility] Primary GI: Dr. Leone Payor Reason for consultation: Hematochezia   HISTORY OF PRESENT ILLNESS:  Patricia English is a 89 y.o. female with multiple significant medical problems as listed below including a history of atrial fibrillation and prior CVA for which she is on chronic Eliquis therapy.  The patient was in her usual state of health until last evening when she had multiple (4) episodes of moderately large hematochezia.  This was painless.  This was associated with dizziness.  She presented to the emergency room.  She was mildly hypotensive but stable.  Has improved with fluids.  Anticipating blood transfusion.  No further bleeding since the very early hours of the morning.  Initial hemoglobin 6.6.  Stable chronic renal insufficiency with BUN 31 and creatinine 1.77.  She has had prior lower GI bleeding secondary to hemorrhoids which were banded in 2023.  Prior complete colonoscopy in 2022 with poor preparation did show diverticulosis.  REVIEW OF SYSTEMS:  All non-GI ROS negative.  Past Medical History:  Diagnosis Date   Coronary artery disease    CVA (cerebral vascular accident) (HCC)    Diabetes mellitus without complication (HCC)    GI bleeding 11/2017   Hypertension    Internal hemorrhoids    Lesion of pancreas 07/31/2021   MR - suspect serous cystadenoma   Paroxysmal atrial fibrillation [facility])     Past Surgical History:  Procedure Laterality Date   BIOPSY  10/30/2020   Procedure: BIOPSY;  Surgeon: Imogene Burn, MD;  Location: [facility] ENDOSCOPY;  Service: Gastroenterology;;   BIOPSY  10/31/2020   Procedure: BIOPSY;  Surgeon: Imogene Burn, MD;  Location: [facility] ENDOSCOPY;  Service: Gastroenterology;;   CATARACT EXTRACTION     COLONOSCOPY WITH PROPOFOL N/A 10/31/2020   Procedure: COLONOSCOPY WITH PROPOFOL;  Surgeon: Imogene Burn, MD;  Location: [facility] ENDOSCOPY;  Service: Gastroenterology;  Laterality: N/A;    ESOPHAGOGASTRODUODENOSCOPY (EGD) WITH PROPOFOL N/A 08/13/2017   Procedure: ESOPHAGOGASTRODUODENOSCOPY (EGD) WITH PROPOFOL;  Surgeon: Iva Boop, MD;  Location: [facility] ENDOSCOPY;  Service: Endoscopy;  Laterality: N/A;   ESOPHAGOGASTRODUODENOSCOPY (EGD) WITH PROPOFOL N/A 10/30/2020   Procedure: ESOPHAGOGASTRODUODENOSCOPY (EGD) WITH PROPOFOL;  Surgeon: Imogene Burn, MD;  Location: [facility] ENDOSCOPY;  Service: Gastroenterology;  Laterality: N/A;   FLEXIBLE SIGMOIDOSCOPY N/A 12/10/2017   Procedure: FLEXIBLE SIGMOIDOSCOPY;  Surgeon: Iva Boop, MD;  Location: [facility] ENDOSCOPY;  Service: Endoscopy;  Laterality: N/A;   FLEXIBLE SIGMOIDOSCOPY N/A 07/22/2021   Procedure: FLEXIBLE SIGMOIDOSCOPY;  Surgeon: Meridee Score Netty Starring., MD;  Location: [facility] ENDOSCOPY;  Service: Gastroenterology;  Laterality: N/A;   FLEXIBLE SIGMOIDOSCOPY N/A 07/30/2021   Procedure: FLEXIBLE SIGMOIDOSCOPY;  Surgeon: Iva Boop, MD;  Location: [facility] ENDOSCOPY;  Service: Gastroenterology;  Laterality: N/A;   HEMORRHOID BANDING  12/10/2017   Procedure: HEMORRHOID BANDING;  Surgeon: Iva Boop, MD;  Location: [facility] ENDOSCOPY;  Service: Endoscopy;;   HEMORRHOID BANDING  07/30/2021   Procedure: [place];  Surgeon: Iva Boop, MD;  Location: [facility] ENDOSCOPY;  Service: Gastroenterology;;   HEMOSTASIS CLIP PLACEMENT  10/31/2020   Procedure: HEMOSTASIS CLIP PLACEMENT;  Surgeon: Imogene Burn, MD;  Location: [facility] ENDOSCOPY;  Service: Gastroenterology;;   POLYPECTOMY  10/31/2020   Procedure: POLYPECTOMY;  Surgeon: Imogene Burn, MD;  Location: [facility] ENDOSCOPY;  Service: Gastroenterology;;    Social History Patricia English  reports that she has never smoked. She has never used smokeless tobacco. She reports that she does not drink alcohol and does not use drugs.  family history includes Diabetes in her mother; Heart Problems in her mother.  Allergies  Allergen Reactions   Lisinopril Swelling and Other (See Comments)    This caused  laryngeal edema       PHYSICAL EXAMINATION: Vital signs: BP (!) 106/49   Pulse 77   Temp (!) 97.5 F (36.4 C) (Oral)   Resp 18   SpO2 100%   Constitutional: Pleasant, generally well-appearing elderly female, no acute distress Psychiatric: alert and oriented x3, cooperative Eyes: extraocular movements intact, anicteric, conjunctiva pink Mouth: oral pharynx moist, no lesions Neck: supple no lymphadenopathy Cardiovascular: heart regular rate and rhythm, no murmur Lungs: clear to auscultation bilaterally Abdomen: soft, nontender, nondistended, no obvious ascites, no peritoneal signs, normal bowel sounds, no organomegaly Rectal: Omitted Extremities: no clubbing, cyanosis, or lower extremity edema bilaterally Skin: no lesions on visible extremities Neuro: No focal deficits.  Cranial nerves intact  ASSESSMENT:  1.  Acute painless lower GI bleed consistent with diverticular bleed. 2.  Acute blood loss anemia 3.  Chronic anticoagulation 4.  General Medical problems 5.  Previous colonoscopy and flexible sigmoidoscopy as noted above   PLAN:  1.  Agree with transfusion to keep hemoglobin above 7 2.  Agree with holding anticoagulation in a patient with significant acute GI bleeding. 3.  If the patient were to have recurrent significant bleeding, then consider CTA and if positive consult interventional radiology for embolization therapy Discussed with the patient and her son.  GI will follow.  Patricia English. Patricia English., M.D. [facility] Division of Gastroenterology

## 2023-04-15 NOTE — ED Triage Notes (Signed)
 Patient presents due to rectal bleeding. Last night was the first time in 2 years. Last night she says, "it was pouring like I was going to the bathroom." She also reports weakness and fatigue, but denies SOB.

## 2023-04-15 NOTE — ED Provider Notes (Signed)
 [place] EMERGENCY DEPARTMENT AT [facility] Provider Note   CSN: [id number] Arrival date & time: 04/15/23  1147     History  Chief Complaint  Patient presents with   Rectal Bleeding    Patricia English is a 89 y.o. female.   Rectal Bleeding Patient reported red blood coming out of her rectum.  Is had 3 times today.  History of GI bleeds.  Is on Eliquis.  Last bleed was approximately 2 years ago and was thought to be either due to hemorrhoids or diverticular cause.  Feels a little lightheaded.     Home Medications Prior to Admission medications   Medication Sig Start Date End Date Taking? Authorizing Provider  apixaban (ELIQUIS) 2.5 MG TABS tablet Take 1 tablet (2.5 mg total) by mouth 2 (two) times daily. 08/05/21   Masters, Florentina Addison, DO  Cholecalciferol 25 MCG (1000 UT) tablet Take 1,000 Units by mouth daily.    [provider]  DILT-XR 180 MG 24 hr capsule TAKE 1 CAPSULE (180 MG) BY MOUTH IN THE MORNING 03/13/23   Raulkar, Drema Pry, MD  furosemide (LASIX) 40 MG tablet Take 1 tablet (40 mg total) by mouth every Monday, Wednesday, and Friday. 09/09/22   Raulkar, Drema Pry, MD  isosorbide mononitrate (IMDUR) 30 MG 24 hr tablet Take 30 mg by mouth in the morning.    [provider]  metoprolol tartrate (LOPRESSOR) 25 MG tablet Take 0.5 tablets (12.5 mg total) by mouth 2 (two) times daily. 07/18/21   Rinaldo Cloud, MD  Multiple Vitamin (MULTIVITAMIN WITH MINERALS) TABS tablet Take 1 tablet by mouth daily. 08/05/21   Masters, Katie, DO  pantoprazole (PROTONIX) 40 MG tablet Take 1 tablet (40 mg total) by mouth 2 (two) times daily before a meal. 03/13/23   Raulkar, Drema Pry, MD  polycarbophil (FIBERCON) 625 MG tablet Take 1 tablet (625 mg total) by mouth daily. 07/23/21   Masters, Katie, DO  polyethylene glycol (MIRALAX / GLYCOLAX) 17 g packet Take 17 g by mouth 2 (two) times daily. 08/05/21   Masters, Katie, DO  pravastatin (PRAVACHOL) 40 MG tablet Take 1 tablet (40  mg total) by mouth daily. 07/13/21   Raulkar, Drema Pry, MD      Allergies    Lisinopril    Review of Systems   Review of Systems  Gastrointestinal:  Positive for hematochezia.    Physical Exam Updated Vital Signs BP (!) 106/49   Pulse 77   Temp (!) 97.5 F (36.4 C) (Oral)   Resp 18   SpO2 100%  Physical Exam Vitals and nursing note reviewed.  Cardiovascular:     Rate and Rhythm: Regular rhythm.  Abdominal:     Tenderness: There is no abdominal tenderness.  Genitourinary:    Comments: Rectal exam showed small amount of red blood.  Does have hemorrhoids but no active bleeding seen. Skin:    Capillary Refill: Capillary refill takes less than 2 seconds.  Neurological:     Mental Status: She is alert and oriented to person, place, and time.     ED Results / Procedures / Treatments   Labs (all labs ordered are listed, but only abnormal results are displayed) Labs Reviewed  COMPREHENSIVE METABOLIC PANEL - Abnormal; Notable for the following components:      Result Value   Glucose, Bld 143 (*)    BUN 31 (*)    Creatinine, Ser 1.77 (*)    GFR, Estimated 26 (*)    All other components within  normal limits  CBC - Abnormal; Notable for the following components:   RBC 3.05 (*)    Hemoglobin 6.6 (*)    HCT 23.0 (*)    MCV 75.4 (*)    MCH 21.6 (*)    MCHC 28.7 (*)    RDW 17.9 (*)    All other components within normal limits  PROTIME-INR - Abnormal; Notable for the following components:   Prothrombin Time 20.2 (*)    INR 1.7 (*)    All other components within normal limits  HEMOGLOBIN AND HEMATOCRIT, BLOOD  HEMOGLOBIN AND HEMATOCRIT, BLOOD  TYPE AND SCREEN  PREPARE RBC (CROSSMATCH)    EKG None  Radiology No results found.  Procedures Procedures    Medications Ordered in ED Medications  0.9 %  sodium chloride infusion (Manually program via Guardrails IV Fluids) (has no administration in time range)  metoprolol tartrate (LOPRESSOR) tablet 12.5 mg (has no  administration in time range)  pravastatin (PRAVACHOL) tablet 40 mg (has no administration in time range)  pantoprazole (PROTONIX) EC tablet 40 mg (has no administration in time range)  insulin aspart (novoLOG) injection 0-9 Units (has no administration in time range)  insulin aspart (novoLOG) injection 0-5 Units (has no administration in time range)  acetaminophen (TYLENOL) tablet 650 mg (has no administration in time range)    Or  acetaminophen (TYLENOL) suppository 650 mg (has no administration in time range)  traZODone (DESYREL) tablet 25 mg (has no administration in time range)  ondansetron (ZOFRAN) tablet 4 mg (has no administration in time range)    Or  ondansetron (ZOFRAN) injection 4 mg (has no administration in time range)  sodium chloride 0.9 % bolus 500 mL (500 mLs Intravenous New Bag/Given 04/15/23 1346)    ED Course/ Medical Decision Making/ A&P                                 Medical Decision Making Amount and/or Complexity of Data Reviewed Labs: ordered.  Risk Prescription drug management. Decision regarding hospitalization.   Patient with GI bleed.  History of same.  Differential diagnosis includes various causes of the bleeding.  Appears overall relatively stable.  Not hypotensive.  Is on Eliquis however.  Reviewed previous discharge notes and GI notes.  Does have a hemoglobin 6.3.  Down from baseline.Discussed with Dr. Lavon Paganini from [facility].  Does not need emergent reversal but if becomes unstable would reverse.  Will transfuse 1 unit.  Discussed with hospitalist, who will admit.  CRITICAL CARE Performed by: Benjiman Core Total critical care time: 30 minutes Critical care time was exclusive of separately billable procedures and treating other patients. Critical care was necessary to treat or prevent imminent or life-threatening deterioration. Critical care was time spent personally by me on the following activities: development of treatment plan with  patient and/or surrogate as well as nursing, discussions with consultants, evaluation of patient's response to treatment, examination of patient, obtaining history from patient or surrogate, ordering and performing treatments and interventions, ordering and review of laboratory studies, ordering and review of radiographic studies, pulse oximetry and re-evaluation of patient's condition.         Final Clinical Impression(s) / ED Diagnoses Final diagnoses:  Lower GI bleed  Acute blood loss anemia    Rx / DC Orders ED Discharge Orders     None         Benjiman Core, MD 04/15/23 1457

## 2023-04-16 DIAGNOSIS — I4891 Unspecified atrial fibrillation: Secondary | ICD-10-CM | POA: Diagnosis not present

## 2023-04-16 DIAGNOSIS — Z7901 Long term (current) use of anticoagulants: Secondary | ICD-10-CM | POA: Diagnosis not present

## 2023-04-16 DIAGNOSIS — K922 Gastrointestinal hemorrhage, unspecified: Secondary | ICD-10-CM | POA: Diagnosis not present

## 2023-04-16 LAB — BASIC METABOLIC PANEL
Anion gap: 8 (ref 5–15)
BUN: 25 mg/dL — ABNORMAL HIGH (ref 8–23)
CO2: 22 mmol/L (ref 22–32)
Calcium: 8.6 mg/dL — ABNORMAL LOW (ref 8.9–10.3)
Chloride: 109 mmol/L (ref 98–111)
Creatinine, Ser: 1.51 mg/dL — ABNORMAL HIGH (ref 0.44–1.00)
GFR, Estimated: 32 mL/min — ABNORMAL LOW (ref >60)
Glucose, Bld: 102 mg/dL — ABNORMAL HIGH (ref 70–99)
Potassium: 3.6 mmol/L (ref 3.5–5.1)
Sodium: 139 mmol/L (ref 135–145)

## 2023-04-16 LAB — CBC
HCT: 25.2 % — ABNORMAL LOW (ref 36.0–46.0)
Hemoglobin: 7.4 g/dL — ABNORMAL LOW (ref 12.0–15.0)
MCH: 23.2 pg — ABNORMAL LOW (ref 26.0–34.0)
MCHC: 29.4 g/dL — ABNORMAL LOW (ref 30.0–36.0)
MCV: 79 fL — ABNORMAL LOW (ref 80.0–100.0)
Platelets: 332 10*3/uL (ref 150–400)
RBC: 3.19 MIL/uL — ABNORMAL LOW (ref 3.87–5.11)
RDW: 18 % — ABNORMAL HIGH (ref 11.5–15.5)
WBC: 6.5 10*3/uL (ref 4.0–10.5)
nRBC: 0 % (ref 0.0–0.2)

## 2023-04-16 LAB — HEMOGLOBIN AND HEMATOCRIT, BLOOD
HCT: 26.1 % — ABNORMAL LOW (ref 36.0–46.0)
HCT: 25.8 % — ABNORMAL LOW (ref 36.0–46.0)
Hemoglobin: 7.5 g/dL — ABNORMAL LOW (ref 12.0–15.0)
Hemoglobin: 7.7 g/dL — ABNORMAL LOW (ref 12.0–15.0)

## 2023-04-16 LAB — GLUCOSE, CAPILLARY
Glucose-Capillary: 92 mg/dL (ref 70–99)
Glucose-Capillary: 162 mg/dL — ABNORMAL HIGH (ref 70–99)
Glucose-Capillary: 77 mg/dL (ref 70–99)
Glucose-Capillary: 166 mg/dL — ABNORMAL HIGH (ref 70–99)

## 2023-04-16 LAB — HEMOGLOBIN A1C
Hgb A1c MFr Bld: 6.5 % — ABNORMAL HIGH (ref 4.8–5.6)
Mean Plasma Glucose: 139.85 mg/dL

## 2023-04-16 MED ORDER — SENNOSIDES-DOCUSATE SODIUM 8.6-50 MG PO TABS: 1.0000 | ORAL_TABLET | Freq: Every evening | ORAL | Status: DC | PRN

## 2023-04-16 MED ORDER — GUAIFENESIN 100 MG/5ML PO LIQD: 5.0000 mL | ORAL | Status: DC | PRN

## 2023-04-16 MED ORDER — METOPROLOL TARTRATE 5 MG/5ML IV SOLN: 5.0000 mg | INTRAVENOUS | Status: DC | PRN

## 2023-04-16 MED ORDER — HYDRALAZINE HCL 20 MG/ML IJ SOLN: 10.0000 mg | INTRAMUSCULAR | Status: DC | PRN

## 2023-04-16 MED ORDER — POTASSIUM CHLORIDE 20 MEQ PO PACK
40.0000 meq | PACK | Freq: Once | ORAL | Status: AC
  Administered 2023-04-16: 40 meq via ORAL
  Filled 2023-04-16: qty 2

## 2023-04-16 MED ORDER — IPRATROPIUM-ALBUTEROL 0.5-2.5 (3) MG/3ML IN SOLN: 3.0000 mL | RESPIRATORY_TRACT | Status: DC | PRN

## 2023-04-16 NOTE — Plan of Care (Signed)

## 2023-04-16 NOTE — Progress Notes (Signed)
 PROGRESS NOTE    Patricia English  JWJ:[id number] DOB: Apr 21, 1927 DOA: 04/15/2023 PCP: Orpah Cobb, MD    Brief Narrative:   89 year old with history of HTN, A-fib on Eliquis, DM2 admitted for bright red blood per rectum.  Has prior history of hemorrhoidal bleeding requiring banding.  Over last few days she has had multiple episodes.  She last took her Eliquis day prior to admission in the morning.  Admission hemoglobin 6.6, 1 unit PRBC given  Assessment & Plan:  Principal Problem:   Acute lower GI hemorrhage Active Problems:   Diverticulosis of colon with hemorrhage   Chronic anticoagulation   Acute blood loss anemia    Acute blood loss anemia Acute platelets lower GI bleeding on chronic anticoagulation -This is likely hemorrhoidal bleed versus diverticular bleed.  Transfuse to keep hemoglobin greater than 7.  Hold anticoagulation at this time.  Continue to monitor, if bleeding recurs, obtain CTA - GI consulted -PPI twice daily  Colonoscopy 2022-poor prep, showing diverticulosis   Hypertension -In hypertensives on hold besides metoprolol.  IV as needed   Paroxysmal atrial fibrillation -continue home metoprolol dose to avoid rapid A-fib.  Holding Eliquis, last dose was this morning 3/1.   GERD -Protonix p.o.   Type 2 diabetes Hyperlipidemia -carb modified diet when eating, with moderate dose sliding scale.  A1c 6.5 -Statin     DVT prophylaxis: SCDs Start: 04/15/23 1452    Code Status: Full Code Family Communication:   Cont hospital stay for close Hb monitoring.    Subjective: Doing ok, bleeding slowing down.    Examination:  General exam: Appears calm and comfortable  Respiratory system: Clear to auscultation. Respiratory effort normal. Cardiovascular system: S1 & S2 heard, RRR. No JVD, murmurs, rubs, gallops or clicks. No pedal edema. Gastrointestinal system: Abdomen is nondistended, soft and nontender. No organomegaly or masses felt. Normal bowel sounds  heard. Central nervous system: Alert and oriented. No focal neurological deficits. Extremities: Symmetric 5 x 5 power. Skin: No rashes, lesions or ulcers Psychiatry: Judgement and insight appear normal. Mood & affect appropriate.                Diet Orders (From admission, onward)     Start     Ordered   04/15/23 1453  Diet clear liquid Room service appropriate? Yes; Fluid consistency: Thin  Diet effective now       Question Answer Comment  Room service appropriate? Yes   Fluid consistency: Thin      04/15/23 1453            Objective: Vitals:   04/15/23 2239 04/15/23 2333 04/16/23 0106 04/16/23 0516  BP: 106/62 (!) 115/56 122/64 125/63  Pulse: 66 74 68 66  Resp: 20 18 15 14   Temp: 98.3 F (36.8 C) 98.4 F (36.9 C) 97.8 F (36.6 C) 98.2 F (36.8 C)  TempSrc: Oral Oral Oral Oral  SpO2: 100% 100% 97% 100%  Weight:      Height:        Intake/Output Summary (Last 24 hours) at 04/16/2023 0944 Last data filed at 04/15/2023 2336 Gross per 24 hour  Intake 892 ml  Output --  Net 892 ml   Filed Weights   04/15/23 1728  Weight: 69.5 kg    Scheduled Meds:  insulin aspart  0-5 Units Subcutaneous QHS   insulin aspart  0-9 Units Subcutaneous TID WC   metoprolol tartrate  12.5 mg Oral BID   pantoprazole  40 mg Oral BID AC  pravastatin  40 mg Oral Daily   Continuous Infusions:  Nutritional status     Body mass index is 22.63 kg/m.  Data Reviewed:   CBC: Recent Labs  Lab 04/15/23 1325 04/15/23 1708 04/16/23 0115 04/16/23 0857  WBC 7.1  --  6.5  --   HGB 6.6* 6.6* 7.4* 7.5*  HCT 23.0* 22.4* 25.2* 26.1*  MCV 75.4*  --  79.0*  --   PLT 394  --  332  --    Basic Metabolic Panel: Recent Labs  Lab 04/15/23 1325 04/16/23 0115  NA 137 139  K 3.7 3.6  CL 104 109  CO2 24 22  GLUCOSE 143* 102*  BUN 31* 25*  CREATININE 1.77* 1.51*  CALCIUM 9.1 8.6*   GFR: Estimated Creatinine Clearance: 24.3 mL/min (A) (by C-G formula based on SCr of 1.51  mg/dL (H)). Liver Function Tests: Recent Labs  Lab 04/15/23 1325  AST 16  ALT 9  ALKPHOS 69  BILITOT 0.8  PROT 7.4  ALBUMIN 3.5   No results for input(s): "LIPASE", "AMYLASE" in the last 168 hours. No results for input(s): "AMMONIA" in the last 168 hours. Coagulation Profile: Recent Labs  Lab 04/15/23 1325  INR 1.7*   Cardiac Enzymes: No results for input(s): "CKTOTAL", "CKMB", "CKMBINDEX", "TROPONINI" in the last 168 hours. BNP (last 3 results) No results for input(s): "PROBNP" in the last 8760 hours. HbA1C: Recent Labs    04/16/23 0115  HGBA1C 6.5*   CBG: Recent Labs  Lab 04/15/23 1722 04/15/23 2103 04/16/23 0807  GLUCAP 108* 147* 92   Lipid Profile: No results for input(s): "CHOL", "HDL", "LDLCALC", "TRIG", "CHOLHDL", "LDLDIRECT" in the last 72 hours. Thyroid Function Tests: No results for input(s): "TSH", "T4TOTAL", "FREET4", "T3FREE", "THYROIDAB" in the last 72 hours. Anemia Panel: No results for input(s): "VITAMINB12", "FOLATE", "FERRITIN", "TIBC", "IRON", "RETICCTPCT" in the last 72 hours. Sepsis Labs: No results for input(s): "PROCALCITON", "LATICACIDVEN" in the last 168 hours.  No results found for this or any previous visit (from the past 240 hours).       Radiology Studies: No results found.         LOS: 0 days   Time spent= 35 mins    Miguel Rota, MD [facility]  If 7PM-7AM, please contact night-coverage  04/16/2023, 9:44 AM

## 2023-04-16 NOTE — Progress Notes (Signed)
     Progress Note    ASSESSMENT AND PLAN:   Painless hematochezia-most likely diverticular bleed. D/d hemorrhoidal bleed with previous history of hemorrhoidal banding.  Colonoscopy 2022-poor prep but diverticulosis. A-fib on Eliquis (last dose 2/28)  Plan: -Trend CBC.  Keep Hb>7 -Hold Eliquis -If no further bleeding in the next 24 to 48 hours, likely resume Eliquis -Would like to avoid endoscopic procedures if possible (d/t advanced age)   Got signout from Dr. Marina Goodell. Dr. Meridee Score taking over the service tomorrow.  SUBJECTIVE   Had bowel movement without any obvious blood No abdominal pain. No nausea or vomiting.      OBJECTIVE:     Vital signs in last 24 hours: Temp:  [97.6 F (36.4 C)-98.4 F (36.9 C)] 98.2 F (36.8 C) (03/02 1311) Pulse Rate:  [66-78] 69 (03/02 1311) Resp:  [14-20] 16 (03/02 1311) BP: (106-132)/(52-66) 125/64 (03/02 1311) SpO2:  [97 %-100 %] 100 % (03/02 1311) Weight:  [69.5 kg] 69.5 kg (03/01 1728) Last BM Date : 04/16/23 General:   Alert, well-developed, in NAD EENT:  Normal hearing, non icteric sclera, conjunctive pink.  Heart:  Regular rate and rhythm; no murmur.  No lower extremity edema   Pulm: Normal respiratory effort, lungs CTA bilaterally without wheezes or crackles. Abdomen:  Soft, nondistended, nontender.  Normal bowel sounds,.       Neurologic:  Alert and  oriented x4;  grossly normal neurologically. Psych:  Pleasant, cooperative.  Normal mood and affect.   Intake/Output from previous day: 03/01 0701 - 03/02 0700 In: 892 [P.O.:480; Blood:412] Out: -  Intake/Output this shift: No intake/output data recorded.  Lab Results: Recent Labs    04/15/23 1325 04/15/23 1708 04/16/23 0115 04/16/23 0857  WBC 7.1  --  6.5  --   HGB 6.6* 6.6* 7.4* 7.5*  HCT 23.0* 22.4* 25.2* 26.1*  PLT 394  --  332  --    BMET Recent Labs    04/15/23 1325 04/16/23 0115  NA 137 139  K 3.7 3.6  CL 104 109  CO2 24 22  GLUCOSE 143* 102*   BUN 31* 25*  CREATININE 1.77* 1.51*  CALCIUM 9.1 8.6*   LFT Recent Labs    04/15/23 1325  PROT 7.4  ALBUMIN 3.5  AST 16  ALT 9  ALKPHOS 69  BILITOT 0.8   PT/INR Recent Labs    04/15/23 1325  LABPROT 20.2*  INR 1.7*   Hepatitis Panel No results for input(s): "HEPBSAG", "HCVAB", "HEPAIGM", "HEPBIGM" in the last 72 hours.  No results found.   Principal Problem:   Acute lower GI hemorrhage Active Problems:   Diverticulosis of colon with hemorrhage   Chronic anticoagulation   Acute blood loss anemia     LOS: 0 days     Edman Circle, MD 04/16/2023, 2:15 PM [facility] [phone]

## 2023-04-16 NOTE — Hospital Course (Addendum)
 Brief Narrative:   89 year old with history of HTN, A-fib on Eliquis, DM2 admitted for bright red blood per rectum.  Has prior history of hemorrhoidal bleeding requiring banding.  Over last few days she has had multiple episodes.  She last took her Eliquis day prior to admission in the morning.  Admission hemoglobin 6.6, 1 unit PRBC given.  With conservative management her symptoms improved and her hemoglobin stabilized as well.  She was also given 1 dose of IV iron during this hospitalization.  GI cleared for discharge.  Will resume Eliquis, give p.o. supplements of iron with bowel regimen. Medically stable for discharge today with outpatient follow-up  Assessment & Plan:  Principal Problem:   Acute lower GI hemorrhage Active Problems:   Diverticulosis of colon with hemorrhage   Chronic anticoagulation   Acute blood loss anemia    Acute blood loss anemia Acute platelets lower GI bleeding on chronic anticoagulation -This is likely hemorrhoidal bleed versus diverticular bleed.  Transfuse to keep hemoglobin greater than 7.  LB GI following.  Conservative management given her age and comorbidities. Resume Eliquis today.  -PPI twice daily. IV Iron given, now start PO Iron with bowel regimen.  Colonoscopy 2022-poor prep, showing diverticulosis   Hypertension -In hypertensives on hold besides metoprolol.  IV as needed   Paroxysmal atrial fibrillation -continue home metoprolol dose to avoid rapid A-fib. Resume Eliquis.    GERD -Protonix p.o.   Type 2 diabetes Hyperlipidemia -carb modified diet when eating, with moderate dose sliding scale.  A1c 6.5 -Statin     DVT prophylaxis: SCDs Start: 04/15/23 1452    Code Status: Full Code Family Communication:  Called [facility] Hb stable Will Dc her    Subjective: Doing well. No more bleeding.    Examination:  General exam: Appears calm and comfortable  Respiratory system: Clear to auscultation. Respiratory effort  normal. Cardiovascular system: S1 & S2 heard, RRR. No JVD, murmurs, rubs, gallops or clicks. No pedal edema. Gastrointestinal system: Abdomen is nondistended, soft and nontender. No organomegaly or masses felt. Normal bowel sounds heard. Central nervous system: Alert and oriented. No focal neurological deficits. Extremities: Symmetric 5 x 5 power. Skin: No rashes, lesions or ulcers Psychiatry: Judgement and insight appear normal. Mood & affect appropriate.

## 2023-04-16 NOTE — Care Management Obs Status (Signed)
 MEDICARE OBSERVATION STATUS NOTIFICATION   Patient Details  Name: Patricia English MRN: [id number] Date of Birth: 12/21/1927   Medicare Observation Status Notification Given:  Yes    Diona Browner, LCSW 04/16/2023, 3:09 PM

## 2023-04-16 NOTE — TOC Initial Note (Signed)
 Transition of Care [facility]) - Initial/Assessment Note    Patient Details  Name: Patricia English MRN: [id number] Date of Birth: 26-May-1927  Transition of [facility]) CM/SW Contact:    Diona Browner, LCSW Phone Number: 04/16/2023, 12:27 PM  Clinical Narrative:                 Pt form home w/ adult children. TOC following for d/c needs.     Barriers to Discharge: Continued Medical Work up   Patient Goals and CMS Choice Patient states their goals for this hospitalization and ongoing recovery are:: return home          Expected Discharge Plan and Services       Living arrangements for the past 2 months: Single Family Home                                      Prior Living Arrangements/Services Living arrangements for the past 2 months: Single Family Home Lives with:: Adult Children Patient language and need for interpreter reviewed:: Yes Do you feel safe going back to the place where you live?: Yes      Need for Family Participation in Patient Care: Yes (Comment)     Criminal Activity/Legal Involvement Pertinent to Current Situation/Hospitalization: No - Comment as needed  Activities of Daily Living   ADL Screening (condition at time of admission) Independently performs ADLs?: No Does the patient have a NEW difficulty with bathing/dressing/toileting/self-feeding that is expected to last >3 days?: No Does the patient have a NEW difficulty with getting in/out of bed, walking, or climbing stairs that is expected to last >3 days?: No Does the patient have a NEW difficulty with communication that is expected to last >3 days?: No Is the patient deaf or have difficulty hearing?: No Does the patient have difficulty seeing, even when wearing glasses/contacts?: No Does the patient have difficulty concentrating, remembering, or making decisions?: No  Permission Sought/Granted                  Emotional Assessment Appearance:: Appears stated  age Attitude/Demeanor/Rapport: Engaged Affect (typically observed): Accepting   Alcohol / Substance Use: Not Applicable Psych Involvement: No (comment)  Admission diagnosis:  Acute blood loss anemia [D62] Acute lower GI hemorrhage [K92.2] Lower GI bleed [K92.2] Patient Active Problem List   Diagnosis Date Noted   Acute lower GI hemorrhage 04/15/2023   Diverticulosis of colon with hemorrhage 04/15/2023   Chronic anticoagulation 04/15/2023   Acute blood loss anemia 04/15/2023   Rectal bleeding 08/03/2021   Lesion of pancreas 07/31/2021   Chronic kidney disease (CKD) 07/29/2021   Atrial fibrillation (HCC) 07/29/2021   Coronary artery disease 07/29/2021   Hyperlipidemia 07/29/2021   Acute on chronic blood loss anemia 07/22/2021   Essential hypertension 06/24/2021   Acute on chronic renal failure (HCC) 06/24/2021   Anemia 06/24/2021   Acute ischemic right MCA stroke (HCC) 06/21/2021   Acute stroke due to ischemia (HCC) 06/16/2021   DM2 (diabetes mellitus, type 2) (HCC) 10/28/2020   Weakness 10/28/2020    Class: Acute   Acute upper GI bleed 10/28/2020   Internal and external bleeding hemorrhoids    Acute lower GI bleeding 12/08/2017   Dysphagia 08/11/2017    Class: Acute   PCP:  Orpah Cobb, MD Pharmacy:   [facility] [phone] [place], [place] - [address] [place] [address] Phone: [phone]  Fax: [phone]     Social Drivers of Health (SDOH) Social History: SDOH Screenings   Food Insecurity: No Food Insecurity (04/15/2023)  Housing: Low Risk  (04/15/2023)  Transportation Needs: No Transportation Needs (04/15/2023)  Utilities: Not At Risk (04/15/2023)  Depression (PHQ2-9): Low Risk  (07/06/2021)  Social Connections: Moderately Integrated (04/15/2023)  Tobacco Use: Low Risk  (04/15/2023)   SDOH Interventions:     Readmission Risk Interventions     No data to display

## 2023-04-16 NOTE — Plan of Care (Signed)
 Patricia English has gotten up to [facility] multiple times today to void and had one bowel movement.  No bleeding noted after any of those instances.  Hgb steady/slightly improving.

## 2023-04-17 ENCOUNTER — Telehealth: Payer: Self-pay

## 2023-04-17 DIAGNOSIS — I4891 Unspecified atrial fibrillation: Secondary | ICD-10-CM | POA: Diagnosis not present

## 2023-04-17 DIAGNOSIS — D509 Iron deficiency anemia, unspecified: Secondary | ICD-10-CM | POA: Diagnosis not present

## 2023-04-17 DIAGNOSIS — K921 Melena: Secondary | ICD-10-CM

## 2023-04-17 DIAGNOSIS — D62 Acute posthemorrhagic anemia: Secondary | ICD-10-CM | POA: Diagnosis present

## 2023-04-17 DIAGNOSIS — E785 Hyperlipidemia, unspecified: Secondary | ICD-10-CM | POA: Diagnosis present

## 2023-04-17 DIAGNOSIS — Z833 Family history of diabetes mellitus: Secondary | ICD-10-CM | POA: Diagnosis not present

## 2023-04-17 DIAGNOSIS — D6832 Hemorrhagic disorder due to extrinsic circulating anticoagulants: Secondary | ICD-10-CM | POA: Diagnosis present

## 2023-04-17 DIAGNOSIS — I480 Paroxysmal atrial fibrillation: Secondary | ICD-10-CM | POA: Diagnosis present

## 2023-04-17 DIAGNOSIS — T45515A Adverse effect of anticoagulants, initial encounter: Secondary | ICD-10-CM | POA: Diagnosis present

## 2023-04-17 DIAGNOSIS — K625 Hemorrhage of anus and rectum: Secondary | ICD-10-CM

## 2023-04-17 DIAGNOSIS — I959 Hypotension, unspecified: Secondary | ICD-10-CM | POA: Diagnosis present

## 2023-04-17 DIAGNOSIS — K922 Gastrointestinal hemorrhage, unspecified: Secondary | ICD-10-CM | POA: Diagnosis not present

## 2023-04-17 DIAGNOSIS — E1122 Type 2 diabetes mellitus with diabetic chronic kidney disease: Secondary | ICD-10-CM | POA: Diagnosis present

## 2023-04-17 DIAGNOSIS — K5731 Diverticulosis of large intestine without perforation or abscess with bleeding: Secondary | ICD-10-CM | POA: Diagnosis present

## 2023-04-17 DIAGNOSIS — Z888 Allergy status to other drugs, medicaments and biological substances status: Secondary | ICD-10-CM | POA: Diagnosis not present

## 2023-04-17 DIAGNOSIS — I129 Hypertensive chronic kidney disease with stage 1 through stage 4 chronic kidney disease, or unspecified chronic kidney disease: Secondary | ICD-10-CM | POA: Diagnosis present

## 2023-04-17 DIAGNOSIS — I2510 Atherosclerotic heart disease of native coronary artery without angina pectoris: Secondary | ICD-10-CM | POA: Diagnosis present

## 2023-04-17 DIAGNOSIS — Z7901 Long term (current) use of anticoagulants: Secondary | ICD-10-CM | POA: Diagnosis not present

## 2023-04-17 DIAGNOSIS — K219 Gastro-esophageal reflux disease without esophagitis: Secondary | ICD-10-CM | POA: Diagnosis present

## 2023-04-17 DIAGNOSIS — Z8673 Personal history of transient ischemic attack (TIA), and cerebral infarction without residual deficits: Secondary | ICD-10-CM | POA: Diagnosis not present

## 2023-04-17 DIAGNOSIS — K648 Other hemorrhoids: Secondary | ICD-10-CM

## 2023-04-17 DIAGNOSIS — Z79899 Other long term (current) drug therapy: Secondary | ICD-10-CM | POA: Diagnosis not present

## 2023-04-17 DIAGNOSIS — K644 Residual hemorrhoidal skin tags: Secondary | ICD-10-CM | POA: Diagnosis present

## 2023-04-17 LAB — TYPE AND SCREEN
ABO/RH(D): O POS
Antibody Screen: NEGATIVE
Unit division: 0

## 2023-04-17 LAB — BASIC METABOLIC PANEL
Anion gap: 8 (ref 5–15)
BUN: 16 mg/dL (ref 8–23)
CO2: 23 mmol/L (ref 22–32)
Calcium: 8.9 mg/dL (ref 8.9–10.3)
Chloride: 109 mmol/L (ref 98–111)
Creatinine, Ser: 1.39 mg/dL — ABNORMAL HIGH (ref 0.44–1.00)
GFR, Estimated: 35 mL/min — ABNORMAL LOW (ref >60)
Glucose, Bld: 93 mg/dL (ref 70–99)
Potassium: 4.2 mmol/L (ref 3.5–5.1)
Sodium: 140 mmol/L (ref 135–145)

## 2023-04-17 LAB — HEMOGLOBIN AND HEMATOCRIT, BLOOD
HCT: 29 % — ABNORMAL LOW (ref 36.0–46.0)
Hemoglobin: 8.5 g/dL — ABNORMAL LOW (ref 12.0–15.0)

## 2023-04-17 LAB — CBC
HCT: 27.5 % — ABNORMAL LOW (ref 36.0–46.0)
Hemoglobin: 7.9 g/dL — ABNORMAL LOW (ref 12.0–15.0)
MCH: 23 pg — ABNORMAL LOW (ref 26.0–34.0)
MCHC: 28.7 g/dL — ABNORMAL LOW (ref 30.0–36.0)
MCV: 79.9 fL — ABNORMAL LOW (ref 80.0–100.0)
Platelets: 359 10*3/uL (ref 150–400)
RBC: 3.44 MIL/uL — ABNORMAL LOW (ref 3.87–5.11)
RDW: 18.4 % — ABNORMAL HIGH (ref 11.5–15.5)
WBC: 8.2 10*3/uL (ref 4.0–10.5)
nRBC: 0 % (ref 0.0–0.2)

## 2023-04-17 LAB — GLUCOSE, CAPILLARY
Glucose-Capillary: 97 mg/dL (ref 70–99)
Glucose-Capillary: 150 mg/dL — ABNORMAL HIGH (ref 70–99)
Glucose-Capillary: 85 mg/dL (ref 70–99)
Glucose-Capillary: 153 mg/dL — ABNORMAL HIGH (ref 70–99)

## 2023-04-17 LAB — BPAM RBC
Blood Product Expiration Date: 202503262359
ISSUE DATE / TIME: 202503012005
Unit Type and Rh: 5100

## 2023-04-17 LAB — PROTIME-INR
INR: 1.4 — ABNORMAL HIGH (ref 0.8–1.2)
Prothrombin Time: 17.1 s — ABNORMAL HIGH (ref 11.4–15.2)

## 2023-04-17 LAB — MAGNESIUM: Magnesium: 2 mg/dL (ref 1.7–2.4)

## 2023-04-17 MED ORDER — HYDROCORTISONE (PERIANAL) 2.5 % EX CREA
TOPICAL_CREAM | Freq: Two times a day (BID) | CUTANEOUS | Status: DC
  Administered 2023-04-17: 1 via RECTAL
  Filled 2023-04-17: qty 28.35

## 2023-04-17 MED ORDER — HYDROCORTISONE ACETATE 25 MG RE SUPP
25.0000 mg | Freq: Every day | RECTAL | Status: DC
Start: 2023-04-17 — End: 2023-05-01
  Administered 2023-04-17: 25 mg via RECTAL
  Filled 2023-04-17: qty 1

## 2023-04-17 MED ORDER — SODIUM CHLORIDE 0.9 % IV SOLN
100.0000 mg | Freq: Once | INTRAVENOUS | Status: AC
  Administered 2023-04-17: 100 mg via INTRAVENOUS
  Filled 2023-04-17: qty 5

## 2023-04-17 NOTE — Progress Notes (Signed)
 1 episode of bleeding from rectum. VS taken. BP 113/62 mmhg. 02 sat 100% on room air. RR 20. No complaints of pain of shortness of breath.

## 2023-04-17 NOTE — Progress Notes (Signed)
 PROGRESS NOTE    Patricia English  WJX:[id number] DOB: [birth year] DOA: 04/15/2023 PCP: Orpah Cobb, MD    Brief Narrative:   89 year old with history of HTN, A-fib on Eliquis, DM2 admitted for bright red blood per rectum.  Has prior history of hemorrhoidal bleeding requiring banding.  Over last few days she has had multiple episodes.  She last took her Eliquis day prior to admission in the morning.  Admission hemoglobin 6.6, 1 unit PRBC given  Assessment & Plan:  Principal Problem:   Acute lower GI hemorrhage Active Problems:   Diverticulosis of colon with hemorrhage   Chronic anticoagulation   Acute blood loss anemia    Acute blood loss anemia Acute platelets lower GI bleeding on chronic anticoagulation -This is likely hemorrhoidal bleed versus diverticular bleed.  Transfuse to keep hemoglobin greater than 7.  Holding off on anticoagulation.  LB GI following.  Conservative management given her age and comorbidities. -PPI twice daily  Colonoscopy 2022-poor prep, showing diverticulosis   Hypertension -In hypertensives on hold besides metoprolol.  IV as needed   Paroxysmal atrial fibrillation -continue home metoprolol dose to avoid rapid A-fib.  Holding Eliquis, last dose was this morning 3/1.   GERD -Protonix p.o.   Type 2 diabetes Hyperlipidemia -carb modified diet when eating, with moderate dose sliding scale.  A1c 6.5 -Statin     DVT prophylaxis: SCDs Start: 04/15/23 1452    Code Status: Full Code Family Communication:  Called [facility] hospital stay for close Hb monitoring. Pending Gi clearance.     Subjective: Minimal bleeding overnight.  No other complaints   Examination:  General exam: Appears calm and comfortable  Respiratory system: Clear to auscultation. Respiratory effort normal. Cardiovascular system: S1 & S2 heard, RRR. No JVD, murmurs, rubs, gallops or clicks. No pedal edema. Gastrointestinal system: Abdomen is nondistended, soft and  nontender. No organomegaly or masses felt. Normal bowel sounds heard. Central nervous system: Alert and oriented. No focal neurological deficits. Extremities: Symmetric 5 x 5 power. Skin: No rashes, lesions or ulcers Psychiatry: Judgement and insight appear normal. Mood & affect appropriate.        Diet Orders (From admission, onward)     Start     Ordered   04/15/23 1453  Diet clear liquid Room service appropriate? Yes; Fluid consistency: Thin  Diet effective now       Question Answer Comment  Room service appropriate? Yes   Fluid consistency: Thin      04/15/23 1453            Objective: Vitals:   04/16/23 1311 04/16/23 2047 04/17/23 0459 04/17/23 0917  BP: 125/64 (!) 121/47 113/62 120/63  Pulse: 69 76 84 74  Resp: 16 19 20    Temp: 98.2 F (36.8 C) 98.2 F (36.8 C) 98.7 F (37.1 C)   TempSrc: Oral Oral Oral   SpO2: 100% 100% 100%   Weight:      Height:        Intake/Output Summary (Last 24 hours) at 04/17/2023 1138 Last data filed at 04/17/2023 2130 Gross per 24 hour  Intake 720 ml  Output --  Net 720 ml   Filed Weights   04/15/23 1728  Weight: 69.5 kg    Scheduled Meds:  insulin aspart  0-5 Units Subcutaneous QHS   insulin aspart  0-9 Units Subcutaneous TID WC   metoprolol tartrate  12.5 mg Oral BID   pantoprazole  40 mg Oral BID AC   pravastatin  40 mg Oral Daily  Continuous Infusions:  Nutritional status     Body mass index is 22.63 kg/m.  Data Reviewed:   CBC: Recent Labs  Lab 04/15/23 1325 04/15/23 1708 04/16/23 0115 04/16/23 0857 04/16/23 1650 04/17/23 0431  WBC 7.1  --  6.5  --   --  8.2  HGB 6.6* 6.6* 7.4* 7.5* 7.7* 7.9*  HCT 23.0* 22.4* 25.2* 26.1* 25.8* 27.5*  MCV 75.4*  --  79.0*  --   --  79.9*  PLT 394  --  332  --   --  359   Basic Metabolic Panel: Recent Labs  Lab 04/15/23 1325 04/16/23 0115 04/17/23 0431  NA 137 139 140  K 3.7 3.6 4.2  CL 104 109 109  CO2 24 22 23   GLUCOSE 143* 102* 93  BUN 31* 25* 16   CREATININE 1.77* 1.51* 1.39*  CALCIUM 9.1 8.6* 8.9  MG  --   --  2.0   GFR: Estimated Creatinine Clearance: 26.4 mL/min (A) (by C-G formula based on SCr of 1.39 mg/dL (H)). Liver Function Tests: Recent Labs  Lab 04/15/23 1325  AST 16  ALT 9  ALKPHOS 69  BILITOT 0.8  PROT 7.4  ALBUMIN 3.5   No results for input(s): "LIPASE", "AMYLASE" in the last 168 hours. No results for input(s): "AMMONIA" in the last 168 hours. Coagulation Profile: Recent Labs  Lab 04/15/23 1325 04/17/23 0431  INR 1.7* 1.4*   Cardiac Enzymes: No results for input(s): "CKTOTAL", "CKMB", "CKMBINDEX", "TROPONINI" in the last 168 hours. BNP (last 3 results) No results for input(s): "PROBNP" in the last 8760 hours. HbA1C: Recent Labs    04/16/23 0115  HGBA1C 6.5*   CBG: Recent Labs  Lab 04/16/23 1158 04/16/23 1650 04/16/23 2043 04/17/23 0743 04/17/23 1121  GLUCAP 162* 77 166* 97 150*   Lipid Profile: No results for input(s): "CHOL", "HDL", "LDLCALC", "TRIG", "CHOLHDL", "LDLDIRECT" in the last 72 hours. Thyroid Function Tests: No results for input(s): "TSH", "T4TOTAL", "FREET4", "T3FREE", "THYROIDAB" in the last 72 hours. Anemia Panel: No results for input(s): "VITAMINB12", "FOLATE", "FERRITIN", "TIBC", "IRON", "RETICCTPCT" in the last 72 hours. Sepsis Labs: No results for input(s): "PROCALCITON", "LATICACIDVEN" in the last 168 hours.  No results found for this or any previous visit (from the past 240 hours).       Radiology Studies: No results found.         LOS: 0 days   Time spent= 35 mins    Miguel Rota, MD [facility]  If 7PM-7AM, please contact night-coverage  04/17/2023, 11:38 AM

## 2023-04-17 NOTE — Progress Notes (Addendum)
 Progress Note   LOS: 0 days   Chief Complaint: Hematochezia   Subjective   Patient reports no blood in stool since Saturday (3/1). Just had a BM which was normal. Notes this morning when nurse wiped her there was scant blood on tissue. No pain, nausea, vomiting. Feeling well overall.    Objective   Vital signs in last 24 hours: Temp:  [98.2 F (36.8 C)-98.7 F (37.1 C)] 98.7 F (37.1 C) (03/03 0459) Pulse Rate:  [69-84] 74 (03/03 0917) Resp:  [16-20] 20 (03/03 0459) BP: (113-125)/(47-64) 120/63 (03/03 0917) SpO2:  [100 %] 100 % (03/03 0459) Last BM Date : 04/16/23 Last BM recorded by nurses in past 5 days Stool Type: Type 7 (Liquid consistency with no solid pieces) (04/15/2023  6:46 PM)  General:   female in no acute distress  Heart:  Regular rate and rhythm; no murmurs Pulm: Clear anteriorly; no wheezing Abdomen: soft, nondistended, normal bowel sounds in all quadrants. Nontender without guarding. No organomegaly appreciated. Rectal: Multiple external hemorrhoids, nonbleeding.  2 internal hemorrhoids, nontender Extremities:  No edema Neurologic:  Alert and  oriented x4;  No focal deficits.  Psych:  Cooperative. Normal mood and affect.  Intake/Output from previous day: 03/02 0701 - 03/03 0700 In: 240 [P.O.:240] Out: -  Intake/Output this shift: Total I/O In: 480 [P.O.:480] Out: -   Studies/Results: No results found.  Lab Results: Recent Labs    04/15/23 1325 04/15/23 1708 04/16/23 0115 04/16/23 0857 04/16/23 1650 04/17/23 0431  WBC 7.1  --  6.5  --   --  8.2  HGB 6.6*   < > 7.4* 7.5* 7.7* 7.9*  HCT 23.0*   < > 25.2* 26.1* 25.8* 27.5*  PLT 394  --  332  --   --  359   < > = values in this interval not displayed.   BMET Recent Labs    04/15/23 1325 04/16/23 0115 04/17/23 0431  NA 137 139 140  K 3.7 3.6 4.2  CL 104 109 109  CO2 24 22 23   GLUCOSE 143* 102* 93  BUN 31* 25* 16  CREATININE 1.77* 1.51* 1.39*  CALCIUM 9.1 8.6* 8.9   LFT Recent  Labs    04/15/23 1325  PROT 7.4  ALBUMIN 3.5  AST 16  ALT 9  ALKPHOS 69  BILITOT 0.8   PT/INR Recent Labs    04/15/23 1325 04/17/23 0431  LABPROT 20.2* 17.1*  INR 1.7* 1.4*     Scheduled Meds:  insulin aspart  0-5 Units Subcutaneous QHS   insulin aspart  0-9 Units Subcutaneous TID WC   metoprolol tartrate  12.5 mg Oral BID   pantoprazole  40 mg Oral BID AC   pravastatin  40 mg Oral Daily   Continuous Infusions:   Impression:   Painless hematochezia; likely secondary to diverticular bleed History of hemorrhoidal bleed with previous hemorrhoid banding Colonoscopy 2022 - poor prep and diverticulosis Hgb 7.9, improving BUN 16  Afib on Eliquis Last dose 2/28  Anemia Longstanding history of anemia with intermittent periods of rectal bleeding secondary to hemorrhoids.  Her anemia during this admission is obviously from rectal bleeding.  However, she has not had iron in the past.  EGD/colonoscopy September 2022 for GI bleed and anemia with normal upper GI and poor prep and the colonoscopy with diverticulosis, 3 polyps, ulcers in the rectum.   Plan:   - no overt bleeding since Saturday 3/1 (with exception of scant blood on tissue this morning).  If no further bleeding in the next 24 hrs can resume Eliquis - Continue daily CBC and transfuse as needed to maintain HGB > 7  - continue to defer endoscopic evaluation due to advanced age - supportive care - IV iron while she is here with outpatient follow-up to be scheduled for monitoring/workup of her longstanding anemia - Anusol suppositories nightly for internal hemorrhoids with possible banding in the future if recurrent bleeding  Patricia English M Patricia English  04/17/2023, 11:17 AM

## 2023-04-17 NOTE — Telephone Encounter (Signed)
-----   Message from Landfall D. Zehr sent at 04/17/2023  3:26 PM EST ----- Ok that is ok.  I will put that in for now. ----- Message ----- From: Loretha Stapler, RN Sent: 04/17/2023   1:43 PM EST To: Leta Baptist, PA-C  06/12/23 850 am is the first available. I went ahead and grabbed it. ----- Message ----- From: Leta Baptist, PA-C Sent: 04/17/2023   1:30 PM EST To: Loretha Stapler, RN  Hey!  Can you see about getting her an appt with Dr. Leone Payor for hospital follow-up, rectal bleeding, discuss possible banding?  Thank you,  Jess

## 2023-04-18 ENCOUNTER — Other Ambulatory Visit (HOSPITAL_COMMUNITY): Payer: Self-pay

## 2023-04-18 DIAGNOSIS — K922 Gastrointestinal hemorrhage, unspecified: Secondary | ICD-10-CM | POA: Diagnosis not present

## 2023-04-18 LAB — CBC
HCT: 28.4 % — ABNORMAL LOW (ref 36.0–46.0)
Hemoglobin: 8.5 g/dL — ABNORMAL LOW (ref 12.0–15.0)
MCH: 23.5 pg — ABNORMAL LOW (ref 26.0–34.0)
MCHC: 29.9 g/dL — ABNORMAL LOW (ref 30.0–36.0)
MCV: 78.7 fL — ABNORMAL LOW (ref 80.0–100.0)
Platelets: 371 10*3/uL (ref 150–400)
RBC: 3.61 MIL/uL — ABNORMAL LOW (ref 3.87–5.11)
RDW: 18.6 % — ABNORMAL HIGH (ref 11.5–15.5)
WBC: 13.8 10*3/uL — ABNORMAL HIGH (ref 4.0–10.5)
nRBC: 0 % (ref 0.0–0.2)

## 2023-04-18 LAB — BASIC METABOLIC PANEL
Anion gap: 11 (ref 5–15)
BUN: 16 mg/dL (ref 8–23)
CO2: 22 mmol/L (ref 22–32)
Calcium: 8.6 mg/dL — ABNORMAL LOW (ref 8.9–10.3)
Chloride: 102 mmol/L (ref 98–111)
Creatinine, Ser: 1.48 mg/dL — ABNORMAL HIGH (ref 0.44–1.00)
GFR, Estimated: 33 mL/min — ABNORMAL LOW (ref >60)
Glucose, Bld: 129 mg/dL — ABNORMAL HIGH (ref 70–99)
Potassium: 4.2 mmol/L (ref 3.5–5.1)
Sodium: 135 mmol/L (ref 135–145)

## 2023-04-18 LAB — MAGNESIUM: Magnesium: 1.8 mg/dL (ref 1.7–2.4)

## 2023-04-18 LAB — GLUCOSE, CAPILLARY: Glucose-Capillary: 124 mg/dL — ABNORMAL HIGH (ref 70–99)

## 2023-04-18 MED ORDER — DOCUSATE SODIUM 100 MG PO CAPS
100.0000 mg | ORAL_CAPSULE | Freq: Two times a day (BID) | ORAL | 0 refills | Status: AC
  Filled 2023-04-18: qty 120, 60d supply, fill #0

## 2023-04-18 MED ORDER — FERROUS SULFATE 325 (65 FE) MG PO TABS
325.0000 mg | ORAL_TABLET | Freq: Every day | ORAL | 0 refills | Status: AC
  Filled 2023-04-18: qty 90, 90d supply, fill #0

## 2023-04-18 NOTE — Progress Notes (Signed)
 Patient discharged from [facility] Unit 4W Room 1431 per order. PIV removed. All personal belongings and discharge meds forwarded with patient. AVS reviewed with daughter at car side due to her immune concerns. Transportation to home via POV.

## 2023-04-18 NOTE — Discharge Summary (Signed)
 Physician Discharge Summary  Patricia English ZOX:[id number] DOB: [birth year] DOA: 04/15/2023  PCP: Orpah Cobb, MD  Admit date: 04/15/2023 Discharge date: 04/18/2023  Admitted From: Home Disposition: Home  Recommendations for Outpatient Follow-up:  Follow up with PCP in 1-2 weeks Please obtain BMP/CBC in one week your next doctors visit.  Outpatient follow-up with GI Iron supplements with bowel regimen prescribed Okay to resume Eliquis but should have continued discussion with outpatient provider regarding long-term use  Discharge Condition: Stable CODE STATUS: Full code Diet recommendation: Diabetic  Brief/Interim Summary: Brief Narrative:   89 year old with history of HTN, A-fib on Eliquis, DM2 admitted for bright red blood per rectum.  Has prior history of hemorrhoidal bleeding requiring banding.  Over last few days she has had multiple episodes.  She last took her Eliquis day prior to admission in the morning.  Admission hemoglobin 6.6, 1 unit PRBC given.  With conservative management her symptoms improved and her hemoglobin stabilized as well.  She was also given 1 dose of IV iron during this hospitalization.  GI cleared for discharge.  Will resume Eliquis, give p.o. supplements of iron with bowel regimen. Medically stable for discharge today with outpatient follow-up  Assessment & Plan:  Principal Problem:   Acute lower GI hemorrhage Active Problems:   Diverticulosis of colon with hemorrhage   Chronic anticoagulation   Acute blood loss anemia    Acute blood loss anemia Acute platelets lower GI bleeding on chronic anticoagulation -This is likely hemorrhoidal bleed versus diverticular bleed.  Transfuse to keep hemoglobin greater than 7.  LB GI following.  Conservative management given her age and comorbidities. Resume Eliquis today.  -PPI twice daily. IV Iron given, now start PO Iron with bowel regimen.  Colonoscopy 2022-poor prep, showing diverticulosis   Hypertension -In  hypertensives on hold besides metoprolol.  IV as needed   Paroxysmal atrial fibrillation -continue home metoprolol dose to avoid rapid A-fib. Resume Eliquis.    GERD -Protonix p.o.   Type 2 diabetes Hyperlipidemia -carb modified diet when eating, with moderate dose sliding scale.  A1c 6.5 -Statin     DVT prophylaxis: SCDs Start: 04/15/23 1452    Code Status: Full Code Family Communication:  Called [facility] Hb stable Will Dc her    Subjective: Doing well. No more bleeding.    Examination:  General exam: Appears calm and comfortable  Respiratory system: Clear to auscultation. Respiratory effort normal. Cardiovascular system: S1 & S2 heard, RRR. No JVD, murmurs, rubs, gallops or clicks. No pedal edema. Gastrointestinal system: Abdomen is nondistended, soft and nontender. No organomegaly or masses felt. Normal bowel sounds heard. Central nervous system: Alert and oriented. No focal neurological deficits. Extremities: Symmetric 5 x 5 power. Skin: No rashes, lesions or ulcers Psychiatry: Judgement and insight appear normal. Mood & affect appropriate.    Discharge Diagnoses:  Principal Problem:   Acute lower GI hemorrhage Active Problems:   Diverticulosis of colon with hemorrhage   Chronic anticoagulation   Acute blood loss anemia   GI bleed   Hematochezia   Microcytic anemia   Other hemorrhoids   Lower GI bleed      Discharge Exam: Vitals:   04/17/23 2023 04/18/23 0401  BP: (!) 129/52 117/60  Pulse: 86 87  Resp: 20 20  Temp: 99.4 F (37.4 C) 98.7 F (37.1 C)  SpO2: 98%    Vitals:   04/17/23 0917 04/17/23 1210 04/17/23 2023 04/18/23 0401  BP: 120/63 119/64 (!) 129/52 117/60  Pulse: 74 68 86 87  Resp:  16 20 20   Temp:  98.1 F (36.7 C) 99.4 F (37.4 C) 98.7 F (37.1 C)  TempSrc:  Oral Oral Oral  SpO2:  100% 98%   Weight:      Height:          Discharge Instructions   Allergies as of 04/18/2023       Reactions   Lisinopril Swelling, Other  (See Comments)   This caused laryngeal edema        Medication List     TAKE these medications    apixaban 2.5 MG Tabs tablet Commonly known as: ELIQUIS Take 1 tablet (2.5 mg total) by mouth 2 (two) times daily.   Cholecalciferol 25 MCG (1000 UT) tablet Take 1,000 Units by mouth daily.   Dilt-XR 180 MG 24 hr capsule Generic drug: diltiazem TAKE 1 CAPSULE (180 MG) BY MOUTH IN THE MORNING What changed: See the new instructions.   docusate sodium 100 MG capsule Commonly known as: Colace Take 1 capsule (100 mg total) by mouth 2 (two) times daily.   ferrous sulfate 325 (65 FE) MG tablet Take 1 tablet (325 mg total) by mouth daily with breakfast.   FiberCon 625 MG tablet Generic drug: polycarbophil Take 1 tablet (625 mg total) by mouth daily.   furosemide 40 MG tablet Commonly known as: LASIX Take 1 tablet (40 mg total) by mouth every Monday, Wednesday, and Friday.   isosorbide mononitrate 30 MG 24 hr tablet Commonly known as: IMDUR Take 30 mg by mouth in the morning.   metoprolol tartrate 25 MG tablet Commonly known as: LOPRESSOR Take 0.5 tablets (12.5 mg total) by mouth 2 (two) times daily.   multivitamin with minerals Tabs tablet Take 1 tablet by mouth daily.   pantoprazole 40 MG tablet Commonly known as: PROTONIX Take 1 tablet (40 mg total) by mouth 2 (two) times daily before a meal.   polyethylene glycol 17 g packet Commonly known as: MIRALAX / GLYCOLAX Take 17 g by mouth 2 (two) times daily.   pravastatin 40 MG tablet Commonly known as: PRAVACHOL Take 1 tablet (40 mg total) by mouth daily.        Follow-up Information     Iva Boop, MD Follow up on 06/12/2023.   Specialty: Gastroenterology Why: 8:50 AM Contact information: [address] [place] [address] [phone]         Orpah Cobb, MD Follow up in 1 week(s).   Specialty: Cardiology Contact information: [address] [place] [address] [phone]                 Allergies  Allergen Reactions   Lisinopril Swelling and Other (See Comments)    This caused laryngeal edema    You were cared for by a hospitalist during your hospital stay. If you have any questions about your discharge medications or the care you received while you were in the hospital after you are discharged, you can call the unit and asked to speak with the hospitalist on call if the hospitalist that took care of you is not available. Once you are discharged, your primary care physician will handle any further medical issues. Please note that no refills for any discharge medications will be authorized once you are discharged, as it is imperative that you return to your primary care physician (or establish a relationship with a primary care physician if you do not have one) for your aftercare needs so that they can reassess your need for medications  and monitor your lab values.  You were cared for by a hospitalist during your hospital stay. If you have any questions about your discharge medications or the care you received while you were in the hospital after you are discharged, you can call the unit and asked to speak with the hospitalist on call if the hospitalist that took care of you is not available. Once you are discharged, your primary care physician will handle any further medical issues. Please note that NO REFILLS for any discharge medications will be authorized once you are discharged, as it is imperative that you return to your primary care physician (or establish a relationship with a primary care physician if you do not have one) for your aftercare needs so that they can reassess your need for medications and monitor your lab values.  Please request your Prim.MD to go over all Hospital Tests and Procedure/Radiological results at the follow up, please get all Hospital records sent to your Prim MD by signing hospital release before you go home.  Get CBC, CMP, 2 view Chest  X ray checked  by Primary MD during your next visit or SNF MD in 5-7 days ( we routinely change or add medications that can affect your baseline labs and fluid status, therefore we recommend that you get the mentioned basic workup next visit with your PCP, your PCP may decide not to get them or add new tests based on their clinical decision)  On your next visit with your primary care physician please Get Medicines reviewed and adjusted.  If you experience worsening of your admission symptoms, develop shortness of breath, life threatening emergency, suicidal or homicidal thoughts you must seek medical attention immediately by calling 911 or calling your MD immediately  if symptoms less severe.  You Must read complete instructions/literature along with all the possible adverse reactions/side effects for all the Medicines you take and that have been prescribed to you. Take any new Medicines after you have completely understood and accpet all the possible adverse reactions/side effects.   Do not drive, operate heavy machinery, perform activities at heights, swimming or participation in water activities or provide baby sitting services if your were admitted for syncope or siezures until you have seen by Primary MD or a Neurologist and advised to do so again.  Do not drive when taking Pain medications.   Procedures/Studies: DG Ankle Complete Right Result Date: 03/28/2023 CLINICAL DATA:  Fall injury swelling and pain EXAM: RIGHT ANKLE - COMPLETE 3+ VIEW COMPARISON:  None Available. FINDINGS: No fracture or malalignment. Ankle mortise is symmetric. Abundant soft tissue swelling. Small plantar calcaneal spur IMPRESSION: No acute osseous abnormality. Electronically Signed   By: Jasmine Pang M.D.   On: 03/28/2023 15:36     The results of significant diagnostics from this hospitalization (including imaging, microbiology, ancillary and laboratory) are listed below for reference.     Microbiology: No  results found for this or any previous visit (from the past 240 hours).   Labs: BNP (last 3 results) No results for input(s): "BNP" in the last 8760 hours. Basic Metabolic Panel: Recent Labs  Lab 04/15/23 1325 04/16/23 0115 04/17/23 0431 04/18/23 0413  NA 137 139 140 135  K 3.7 3.6 4.2 4.2  CL 104 109 109 102  CO2 24 22 23 22   GLUCOSE 143* 102* 93 129*  BUN 31* 25* 16 16  CREATININE 1.77* 1.51* 1.39* 1.48*  CALCIUM 9.1 8.6* 8.9 8.6*  MG  --   --  2.0 1.8   Liver Function Tests: Recent Labs  Lab 04/15/23 1325  AST 16  ALT 9  ALKPHOS 69  BILITOT 0.8  PROT 7.4  ALBUMIN 3.5   No results for input(s): "LIPASE", "AMYLASE" in the last 168 hours. No results for input(s): "AMMONIA" in the last 168 hours. CBC: Recent Labs  Lab 04/15/23 1325 04/15/23 1708 04/16/23 0115 04/16/23 0857 04/16/23 1650 04/17/23 0431 04/17/23 1654 04/18/23 0413  WBC 7.1  --  6.5  --   --  8.2  --  13.8*  HGB 6.6*   < > 7.4* 7.5* 7.7* 7.9* 8.5* 8.5*  HCT 23.0*   < > 25.2* 26.1* 25.8* 27.5* 29.0* 28.4*  MCV 75.4*  --  79.0*  --   --  79.9*  --  78.7*  PLT 394  --  332  --   --  359  --  371   < > = values in this interval not displayed.   Cardiac Enzymes: No results for input(s): "CKTOTAL", "CKMB", "CKMBINDEX", "TROPONINI" in the last 168 hours. BNP: Invalid input(s): "POCBNP" CBG: Recent Labs  Lab 04/17/23 0743 04/17/23 1121 04/17/23 1634 04/17/23 2015 04/18/23 0734  GLUCAP 97 150* 85 153* 124*   D-Dimer No results for input(s): "DDIMER" in the last 72 hours. Hgb A1c Recent Labs    04/16/23 0115  HGBA1C 6.5*   Lipid Profile No results for input(s): "CHOL", "HDL", "LDLCALC", "TRIG", "CHOLHDL", "LDLDIRECT" in the last 72 hours. Thyroid function studies No results for input(s): "TSH", "T4TOTAL", "T3FREE", "THYROIDAB" in the last 72 hours.  Invalid input(s): "FREET3" Anemia work up No results for input(s): "VITAMINB12", "FOLATE", "FERRITIN", "TIBC", "IRON", "RETICCTPCT" in  the last 72 hours. Urinalysis    Component Value Date/Time   COLORURINE STRAW (A) 06/16/2021 1154   APPEARANCEUR CLEAR 06/16/2021 1154   LABSPEC 1.006 06/16/2021 1154   PHURINE 5.0 06/16/2021 1154   GLUCOSEU NEGATIVE 06/16/2021 1154   HGBUR SMALL (A) 06/16/2021 1154   BILIRUBINUR NEGATIVE 06/16/2021 1154   KETONESUR NEGATIVE 06/16/2021 1154   PROTEINUR NEGATIVE 06/16/2021 1154   UROBILINOGEN 0.2 10/13/2012 1524   NITRITE POSITIVE (A) 06/16/2021 1154   LEUKOCYTESUR NEGATIVE 06/16/2021 1154   Sepsis Labs Recent Labs  Lab 04/15/23 1325 04/16/23 0115 04/17/23 0431 04/18/23 0413  WBC 7.1 6.5 8.2 13.8*   Microbiology No results found for this or any previous visit (from the past 240 hours).   Time coordinating discharge:  I have spent 35 minutes face to face with the patient and on the ward discussing the patients care, assessment, plan and disposition with other care givers. >50% of the time was devoted counseling the patient about the risks and benefits of treatment/Discharge disposition and coordinating care.   SIGNED:   Miguel Rota, MD  [facility] 04/18/2023, 10:05 AM   If 7PM-7AM, please contact night-coverage

## 2023-04-18 NOTE — Care Management Important Message (Signed)
 Important Message  Patient Details IM Letter given. Name: Kennidi Yoshida MRN: [id number] Date of Birth: [birth year]   Important Message Given:  Yes - Medicare IM     Caren Macadam 04/18/2023, 10:04 AM

## 2023-04-18 NOTE — Plan of Care (Signed)
 Problem: Education: Goal: Ability to describe self-care measures that may prevent or decrease complications (Diabetes Survival Skills Education) will improve Outcome: Adequate for Discharge Goal: Individualized Educational Video(s) Outcome: Adequate for Discharge   Problem: Coping: Goal: Ability to adjust to condition or change in health will improve Outcome: Adequate for Discharge   Problem: Fluid Volume: Goal: Ability to maintain a balanced intake and output will improve Outcome: Adequate for Discharge   Problem: Health Behavior/Discharge Planning: Goal: Ability to identify and utilize available resources and services will improve 04/18/2023 1133 by Payton Spark, RN Outcome: Adequate for Discharge 04/18/2023 1132 by Payton Spark, RN Outcome: Progressing Goal: Ability to manage health-related needs will improve 04/18/2023 1133 by Payton Spark, RN Outcome: Adequate for Discharge 04/18/2023 1132 by Payton Spark, RN Outcome: Progressing   Problem: Metabolic: Goal: Ability to maintain appropriate glucose levels will improve 04/18/2023 1133 by Payton Spark, RN Outcome: Adequate for Discharge 04/18/2023 1132 by Payton Spark, RN Outcome: Progressing   Problem: Nutritional: Goal: Maintenance of adequate nutrition will improve 04/18/2023 1133 by Payton Spark, RN Outcome: Adequate for Discharge 04/18/2023 1132 by Payton Spark, RN Outcome: Progressing Goal: Progress toward achieving an optimal weight will improve 04/18/2023 1133 by Payton Spark, RN Outcome: Adequate for Discharge 04/18/2023 1132 by Payton Spark, RN Outcome: Progressing   Problem: Skin Integrity: Goal: Risk for impaired skin integrity will decrease 04/18/2023 1133 by Payton Spark, RN Outcome: Adequate for Discharge 04/18/2023 1132 by Payton Spark, RN Outcome: Progressing   Problem: Tissue Perfusion: Goal: Adequacy of tissue perfusion will improve 04/18/2023 1133 by Payton Spark, RN Outcome:  Adequate for Discharge 04/18/2023 1132 by Payton Spark, RN Outcome: Progressing   Problem: Education: Goal: Knowledge of General Education information will improve Description: Including pain rating scale, medication(s)/side effects and non-pharmacologic comfort measures 04/18/2023 1133 by Payton Spark, RN Outcome: Adequate for Discharge 04/18/2023 1132 by Payton Spark, RN Outcome: Progressing   Problem: Health Behavior/Discharge Planning: Goal: Ability to manage health-related needs will improve 04/18/2023 1133 by Payton Spark, RN Outcome: Adequate for Discharge 04/18/2023 1132 by Payton Spark, RN Outcome: Progressing   Problem: Clinical Measurements: Goal: Ability to maintain clinical measurements within normal limits will improve 04/18/2023 1133 by Payton Spark, RN Outcome: Adequate for Discharge 04/18/2023 1132 by Payton Spark, RN Outcome: Progressing Goal: Will remain free from infection 04/18/2023 1133 by Payton Spark, RN Outcome: Adequate for Discharge 04/18/2023 1132 by Payton Spark, RN Outcome: Progressing Goal: Diagnostic test results will improve 04/18/2023 1133 by Payton Spark, RN Outcome: Adequate for Discharge 04/18/2023 1132 by Payton Spark, RN Outcome: Progressing Goal: Respiratory complications will improve 04/18/2023 1133 by Payton Spark, RN Outcome: Adequate for Discharge 04/18/2023 1132 by Payton Spark, RN Outcome: Progressing Goal: Cardiovascular complication will be avoided 04/18/2023 1133 by Payton Spark, RN Outcome: Adequate for Discharge 04/18/2023 1132 by Payton Spark, RN Outcome: Progressing   Problem: Activity: Goal: Risk for activity intolerance will decrease 04/18/2023 1133 by Payton Spark, RN Outcome: Adequate for Discharge 04/18/2023 1132 by Payton Spark, RN Outcome: Progressing   Problem: Nutrition: Goal: Adequate nutrition will be maintained 04/18/2023 1133 by Payton Spark, RN Outcome: Adequate for  Discharge 04/18/2023 1132 by Payton Spark, RN Outcome: Progressing   Problem: Coping: Goal: Level of anxiety will decrease 04/18/2023 1133 by Payton Spark, RN Outcome: Adequate for Discharge 04/18/2023 1132 by Payton Spark, RN Outcome: Progressing   Problem: Elimination: Goal: Will not experience complications related to bowel motility 04/18/2023 1133 by Payton Spark, RN Outcome: Adequate for Discharge 04/18/2023 1132 by Payton Spark, RN  Outcome: Progressing Goal: Will not experience complications related to urinary retention 04/18/2023 1133 by Payton Spark, RN Outcome: Adequate for Discharge 04/18/2023 1132 by Payton Spark, RN Outcome: Progressing   Problem: Pain Managment: Goal: General experience of comfort will improve and/or be controlled 04/18/2023 1133 by Payton Spark, RN Outcome: Adequate for Discharge 04/18/2023 1132 by Payton Spark, RN Outcome: Progressing   Problem: Safety: Goal: Ability to remain free from injury will improve 04/18/2023 1133 by Payton Spark, RN Outcome: Adequate for Discharge 04/18/2023 1132 by Payton Spark, RN Outcome: Progressing   Problem: Skin Integrity: Goal: Risk for impaired skin integrity will decrease 04/18/2023 1133 by Payton Spark, RN Outcome: Adequate for Discharge 04/18/2023 1132 by Payton Spark, RN Outcome: Progressing

## 2023-06-11 ENCOUNTER — Other Ambulatory Visit: Payer: Self-pay | Admitting: Physical Medicine and Rehabilitation

## 2023-06-12 ENCOUNTER — Encounter: Payer: Self-pay | Admitting: Internal Medicine

## 2023-06-12 ENCOUNTER — Ambulatory Visit (INDEPENDENT_AMBULATORY_CARE_PROVIDER_SITE_OTHER): Admitting: Internal Medicine

## 2023-06-12 VITALS — BP 142/66 | HR 75 | Ht 69.0 in | Wt 153.0 lb

## 2023-06-12 DIAGNOSIS — K644 Residual hemorrhoidal skin tags: Secondary | ICD-10-CM

## 2023-06-12 DIAGNOSIS — Z7901 Long term (current) use of anticoagulants: Secondary | ICD-10-CM

## 2023-06-12 DIAGNOSIS — K648 Other hemorrhoids: Secondary | ICD-10-CM

## 2023-06-12 DIAGNOSIS — K5731 Diverticulosis of large intestine without perforation or abscess with bleeding: Secondary | ICD-10-CM

## 2023-06-12 NOTE — Progress Notes (Signed)
 Patricia English 89 y.o. 12-Jul-1927 [id number]  Assessment & Plan:   Encounter Diagnoses  Name Primary?   Internal and external bleeding hemorrhoids Yes   Diverticulosis of colon with hemorrhage    Chronic anticoagulation     To the best of my knowledge based upon what I read in the chart and talking to the patient the bleeding she had that led to hospitalization in early March sounded like a diverticular hemorrhage.  She continues to have some minor hemorrhoidal bleeding intermittently.  She has a combination of internal and external hemorrhoids.  There is a grade 3 right posterior internal hemorrhoid seen today on rectal exam.  We discussed the pros and cons of hemorrhoidal banding and have decided to observe and continue conservative treatment.  She will continue her fiber supplements plus stool softeners and keep follow-up with her primary care provider Dr. Sharyn Deforest there is a visit planned for 2 days from now to review labs.  She should remain on iron  therapy chronically.  Obviously chronic anticoagulation can increase the risk of bleeding, however given her history of atrial fibrillation and strokes and overall tolerance of things I think it is reasonable to continue that.    Return here as needed  CC: Pasqual Bone, MD  Subjective:   Chief Complaint: f/u after GI bleed and hospitalization  HPI 89 year old woman with a history of hemorrhoids and bleeding and a suspected serous cystadenoma of the pancreas (no follow-up planned), hospitalized in early March and discharged on 04/18/2023 with hematochezia and decreased hemoglobin and the clinical impression from gastroenterology service was that she had a diverticular bleed.  She has had flexible sigmoidoscopy with banding of 3 internal hemorrhoid columns on 07/30/2021.  She has known diverticulosis.  A colonoscopy was performed in September 2022 showing diverticulosis and hemorrhoids and a few rectal ulcers and a poor prep.  She remains  on Eliquis  as part of stroke prophylaxis in the setting of atrial fibrillation and history of strokes.  Today she is here with her daughter, the patient reports sometime in the past few days she had a spot of blood on her sheets.  She has not had bleeding with defecation lately.  She is not complaining of anal or rectal pain or abdominal pain and reports that fiber pills (FiberCon) and stool softeners recommended at the hospital have done a good job keeping her bowel movements regular.  She saw Dr. Sharyn Deforest after the hospitalization, and had labs drawn and has a follow-up visit in 2 days to review those.   Allergies  Allergen Reactions   Lisinopril  Swelling and Other (See Comments)    This caused laryngeal edema   Current Meds  Medication Sig   apixaban  (ELIQUIS ) 2.5 MG TABS tablet Take 1 tablet (2.5 mg total) by mouth 2 (two) times daily.   Cholecalciferol  25 MCG (1000 UT) tablet Take 1,000 Units by mouth daily.   DILT-XR 180 MG 24 hr capsule TAKE 1 CAPSULE (180 MG) BY MOUTH IN THE MORNING (Patient taking differently: Take 180 mg by mouth daily.)   docusate sodium  (COLACE) 100 MG capsule Take 1 capsule (100 mg total) by mouth 2 (two) times daily.   ferrous sulfate  325 (65 FE) MG tablet Take 1 tablet (325 mg total) by mouth daily with breakfast.   furosemide  (LASIX ) 40 MG tablet Take 1 tablet (40 mg total) by mouth every Monday, Wednesday, and Friday.   isosorbide  mononitrate (IMDUR ) 30 MG 24 hr tablet Take 30 mg by mouth in the morning.  metoprolol  tartrate (LOPRESSOR ) 25 MG tablet Take 0.5 tablets (12.5 mg total) by mouth 2 (two) times daily.   Multiple Vitamin (MULTIVITAMIN WITH MINERALS) TABS tablet Take 1 tablet by mouth daily.   pantoprazole  (PROTONIX ) 40 MG tablet Take 1 tablet (40 mg total) by mouth 2 (two) times daily before a meal.   polycarbophil (FIBERCON) 625 MG tablet Take 1 tablet (625 mg total) by mouth daily.   pravastatin  (PRAVACHOL ) 40 MG tablet Take 1 tablet (40 mg total)  by mouth daily.   [DISCONTINUED] polyethylene glycol (MIRALAX  / GLYCOLAX ) 17 g packet Take 17 g by mouth 2 (two) times daily.   Past Medical History:  Diagnosis Date   Coronary artery disease    CVA (cerebral vascular accident) (HCC)    Diabetes mellitus without complication (HCC)    GI bleeding 11/2017   Hypertension    Internal hemorrhoids    Lesion of pancreas 07/31/2021   MR - suspect serous cystadenoma   Paroxysmal atrial fibrillation [facility])    Past Surgical History:  Procedure Laterality Date   BIOPSY  10/30/2020   Procedure: BIOPSY;  Surgeon: Daina Drum, MD;  Location: [facility] ENDOSCOPY;  Service: Gastroenterology;;   BIOPSY  10/31/2020   Procedure: BIOPSY;  Surgeon: Daina Drum, MD;  Location: [facility] ENDOSCOPY;  Service: Gastroenterology;;   CATARACT EXTRACTION     COLONOSCOPY WITH PROPOFOL  N/A 10/31/2020   Procedure: COLONOSCOPY WITH PROPOFOL ;  Surgeon: Daina Drum, MD;  Location: [facility] ENDOSCOPY;  Service: Gastroenterology;  Laterality: N/A;   ESOPHAGOGASTRODUODENOSCOPY (EGD) WITH PROPOFOL  N/A 08/13/2017   Procedure: ESOPHAGOGASTRODUODENOSCOPY (EGD) WITH PROPOFOL ;  Surgeon: Kenney Peacemaker, MD;  Location: [facility] ENDOSCOPY;  Service: Endoscopy;  Laterality: N/A;   ESOPHAGOGASTRODUODENOSCOPY (EGD) WITH PROPOFOL  N/A 10/30/2020   Procedure: ESOPHAGOGASTRODUODENOSCOPY (EGD) WITH PROPOFOL ;  Surgeon: Daina Drum, MD;  Location: [facility] ENDOSCOPY;  Service: Gastroenterology;  Laterality: N/A;   FLEXIBLE SIGMOIDOSCOPY N/A 12/10/2017   Procedure: FLEXIBLE SIGMOIDOSCOPY;  Surgeon: Kenney Peacemaker, MD;  Location: [facility] ENDOSCOPY;  Service: Endoscopy;  Laterality: N/A;   FLEXIBLE SIGMOIDOSCOPY N/A 07/22/2021   Procedure: FLEXIBLE SIGMOIDOSCOPY;  Surgeon: Brice Campi Albino Alu., MD;  Location: [facility] ENDOSCOPY;  Service: Gastroenterology;  Laterality: N/A;   FLEXIBLE SIGMOIDOSCOPY N/A 07/30/2021   Procedure: FLEXIBLE SIGMOIDOSCOPY;  Surgeon: Kenney Peacemaker, MD;  Location: [facility] ENDOSCOPY;  Service:  Gastroenterology;  Laterality: N/A;   HEMORRHOID BANDING  12/10/2017   Procedure: HEMORRHOID BANDING;  Surgeon: Kenney Peacemaker, MD;  Location: [place] ENDOSCOPY;  Service: Endoscopy;;   HEMORRHOID BANDING  07/30/2021   Procedure: Harles Lied;  Surgeon: Kenney Peacemaker, MD;  Location: [facility] ENDOSCOPY;  Service: Gastroenterology;;   HEMOSTASIS CLIP PLACEMENT  10/31/2020   Procedure: HEMOSTASIS CLIP PLACEMENT;  Surgeon: Daina Drum, MD;  Location[facility] ENDOSCOPY;  Service: Gastroenterology;;   POLYPECTOMY  10/31/2020   Procedure: POLYPECTOMY;  Surgeon: Daina Drum, MD;  Location: [facility] ENDOSCOPY;  Service: Gastroenterology;;   Social History   Social History Narrative   Patient is widowed   Denies alcohol tobacco use   Lives with adult son   family history includes Diabetes in her mother; Heart Problems in her mother.   Review of Systems As per HPI  Objective:   Physical Exam BP (!) 142/66   Pulse 75   Ht 5\' 9"  (1.753 m)   Wt 153 lb (69.4 kg)   BMI 22.59 kg/m  Patti [place], CMA present.  Rectal exam reveals circumferential external hemorrhoids and there is a grade 3 right posterior  internal hemorrhoid visible.  Digital exam is consistent with internal hemorrhoids as well there is no mass.  Stool is brown and there is no bleeding.   Data reviewed include prior GI visits going back to 2023, hospitalization records from March 2025.

## 2023-06-12 NOTE — Patient Instructions (Addendum)
 Follow up with Dr Sharyn Deforest as planned and come to see us  as needed.   I appreciate the opportunity to care for you. Loy Ruff, MD, [facility]
